# Patient Record
Sex: Male | Born: 1945
Health system: Southern US, Community
[De-identification: ages and names within clinical notes are randomized; demographics above are authoritative.]

## PROBLEM LIST (undated history)

## (undated) ENCOUNTER — Emergency Department (HOSPITAL_COMMUNITY): Discharge status: Absent without leave | Payer: Medicare PPO

## (undated) DIAGNOSIS — C801 Malignant (primary) neoplasm, unspecified: Secondary | ICD-10-CM

## (undated) DIAGNOSIS — I10 Essential (primary) hypertension: Secondary | ICD-10-CM

## (undated) DIAGNOSIS — E785 Hyperlipidemia, unspecified: Secondary | ICD-10-CM

## (undated) DIAGNOSIS — K219 Gastro-esophageal reflux disease without esophagitis: Secondary | ICD-10-CM

## (undated) HISTORY — PX: TONSILLECTOMY: SUR1361

## (undated) HISTORY — PX: OTHER SURGICAL HISTORY: SHX169

---

## 1999-06-08 ENCOUNTER — Ambulatory Visit (HOSPITAL_COMMUNITY): Admission: RE | Admit: 1999-06-08 | Discharge: 1999-06-08 | Payer: Self-pay | Admitting: Family Medicine

## 2007-04-16 ENCOUNTER — Emergency Department (HOSPITAL_COMMUNITY): Admission: EM | Admit: 2007-04-16 | Discharge: 2007-04-16 | Payer: Self-pay | Admitting: Emergency Medicine

## 2007-04-18 ENCOUNTER — Ambulatory Visit (HOSPITAL_COMMUNITY): Admission: RE | Admit: 2007-04-18 | Discharge: 2007-04-19 | Payer: Self-pay | Admitting: Orthopaedic Surgery

## 2007-12-01 ENCOUNTER — Emergency Department (HOSPITAL_COMMUNITY): Admission: EM | Admit: 2007-12-01 | Discharge: 2007-12-01 | Payer: Self-pay | Admitting: Emergency Medicine

## 2008-01-09 ENCOUNTER — Ambulatory Visit (HOSPITAL_COMMUNITY): Admission: RE | Admit: 2008-01-09 | Discharge: 2008-01-09 | Payer: Self-pay | Admitting: General Surgery

## 2008-02-04 ENCOUNTER — Ambulatory Visit (HOSPITAL_COMMUNITY): Admission: RE | Admit: 2008-02-04 | Discharge: 2008-02-04 | Payer: Self-pay | Admitting: General Surgery
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
## 2014-10-21 ENCOUNTER — Encounter (HOSPITAL_COMMUNITY): Payer: Self-pay | Admitting: Emergency Medicine

## 2014-10-21 ENCOUNTER — Emergency Department (HOSPITAL_COMMUNITY): Payer: Medicare PPO

## 2014-10-21 ENCOUNTER — Emergency Department (HOSPITAL_COMMUNITY)
Admission: EM | Admit: 2014-10-21 | Discharge: 2014-10-21 | Disposition: A | Discharge status: Home / Self Care | Payer: Medicare PPO | Attending: Emergency Medicine | Admitting: Emergency Medicine

## 2014-10-21 DIAGNOSIS — K219 Gastro-esophageal reflux disease without esophagitis: Secondary | ICD-10-CM | POA: Diagnosis not present

## 2014-10-21 DIAGNOSIS — Z79899 Other long term (current) drug therapy: Secondary | ICD-10-CM | POA: Insufficient documentation

## 2014-10-21 DIAGNOSIS — M7022 Olecranon bursitis, left elbow: Secondary | ICD-10-CM | POA: Diagnosis not present

## 2014-10-21 DIAGNOSIS — E785 Hyperlipidemia, unspecified: Secondary | ICD-10-CM | POA: Insufficient documentation

## 2014-10-21 DIAGNOSIS — Y9389 Activity, other specified: Secondary | ICD-10-CM | POA: Insufficient documentation

## 2014-10-21 DIAGNOSIS — W228XXA Striking against or struck by other objects, initial encounter: Secondary | ICD-10-CM | POA: Insufficient documentation

## 2014-10-21 DIAGNOSIS — I10 Essential (primary) hypertension: Secondary | ICD-10-CM | POA: Insufficient documentation

## 2014-10-21 DIAGNOSIS — Y92810 Car as the place of occurrence of the external cause: Secondary | ICD-10-CM | POA: Diagnosis not present

## 2014-10-21 DIAGNOSIS — Y998 Other external cause status: Secondary | ICD-10-CM | POA: Insufficient documentation

## 2014-10-21 DIAGNOSIS — S59902A Unspecified injury of left elbow, initial encounter: Secondary | ICD-10-CM | POA: Diagnosis not present

## 2014-10-21 HISTORY — DX: Gastro-esophageal reflux disease without esophagitis: K21.9

## 2014-10-21 HISTORY — DX: Essential (primary) hypertension: I10

## 2014-10-21 HISTORY — DX: Hyperlipidemia, unspecified: E78.5

## 2014-10-21 MED ORDER — NAPROXEN 500 MG PO TABS: 500.0000 mg | ORAL_TABLET | Freq: Two times a day (BID) | ORAL | Status: DC
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
## 2014-10-21 NOTE — Discharge Instructions (Signed)
Bursitis Bursitis is when the fluid-filled sac (bursa) that covers and protects a joint gets puffy and irritated. The elbow, shoulder, hip, and knee joints are most often affected. HOME CARE  Put ice on the area.  Put ice in a plastic bag.  Place a towel between your skin and the bag.  Leave the ice on for 15-20 minutes, 03-04 times a day.  Put the joint through a full range of motion 4 times a day. Rest the injured joint at other times. When you have less pain, begin slow movements and usual activities.  Only take medicine as told by your doctor.  Follow up with your doctor. Any delay in care could stop the bursitis from healing. This could cause long-term pain. GET HELP RIGHT AWAY IF:   You have more pain with treatment.  You have a temperature by mouth above 102 F (38.9 C), not controlled by medicine.  You have heat and irritation over the fluid-filled sac. MAKE SURE YOU:   Understand these instructions.  Will watch your condition.  Will get help right away if you are not doing well or get worse. Document Released: 05/04/2010 Document Revised: 02/06/2012 Document Reviewed: 02/03/2014 [facility] Patient Information 2015 [place], [place]. This information is not intended to replace advice given to you by your health care provider. Make sure you discuss any questions you have with your health care provider.   Your evaluated in the ED today for your elbow discomfort. There is no emergent cause identified. Your symptoms are most consistent with an elbow bursitis. You may take naproxen for any inflammation or discomfort you may experience. You may continue to use an Ace wrap to provide compression around the elbow. If your elbow becomes red, warm or swollen please return to ED for further evaluation. You may follow-up with primary care or [facility]

## 2015-05-20 ENCOUNTER — Encounter: Payer: Self-pay | Admitting: Family

## 2015-05-20 ENCOUNTER — Ambulatory Visit (INDEPENDENT_AMBULATORY_CARE_PROVIDER_SITE_OTHER): Payer: Medicare HMO | Admitting: Family

## 2015-05-20 ENCOUNTER — Ambulatory Visit (INDEPENDENT_AMBULATORY_CARE_PROVIDER_SITE_OTHER)
Admission: RE | Admit: 2015-05-20 | Discharge: 2015-05-20 | Disposition: A | Discharge status: Home / Self Care | Payer: Medicare HMO | Source: Ambulatory Visit | Attending: Family | Admitting: Family

## 2015-05-20 VITALS — BP 104/70 | HR 55 | Temp 98.1°F | Resp 18 | Ht 71.0 in | Wt 170.0 lb

## 2015-05-20 DIAGNOSIS — G8929 Other chronic pain: Secondary | ICD-10-CM | POA: Diagnosis not present

## 2015-05-20 DIAGNOSIS — I10 Essential (primary) hypertension: Secondary | ICD-10-CM | POA: Diagnosis not present

## 2015-05-20 DIAGNOSIS — M25552 Pain in left hip: Secondary | ICD-10-CM

## 2015-05-20 DIAGNOSIS — M436 Torticollis: Secondary | ICD-10-CM | POA: Diagnosis not present

## 2015-05-20 DIAGNOSIS — M25559 Pain in unspecified hip: Secondary | ICD-10-CM

## 2015-05-20 MED ORDER — AMLODIPINE BESYLATE 5 MG PO TABS: 5.0000 mg | ORAL_TABLET | Freq: Every day | ORAL | Status: DC

## 2015-05-20 MED ORDER — NAPROXEN 500 MG PO TABS: 500.0000 mg | ORAL_TABLET | Freq: Two times a day (BID) | ORAL | Status: DC | PRN

## 2015-05-20 MED ORDER — NAPROXEN 500 MG PO TABS
500.0000 mg | ORAL_TABLET | Freq: Two times a day (BID) | ORAL | Status: DC | PRN
Start: 2015-05-20 — End: 2015-09-01

## 2015-05-20 MED ORDER — LISINOPRIL-HYDROCHLOROTHIAZIDE 20-12.5 MG PO TABS: 1.0000 | ORAL_TABLET | Freq: Every day | ORAL | Status: DC

## 2015-05-20 MED ORDER — OMEPRAZOLE 20 MG PO CPDR: 20.0000 mg | DELAYED_RELEASE_CAPSULE | Freq: Every day | ORAL | Status: DC

## 2015-05-20 NOTE — Patient Instructions (Signed)
Thank you for choosing [facility].  Summary/Instructions:  Your prescription(s) have been submitted to your pharmacy or been printed and provided for you. Please take as directed and contact our office if you believe you are having problem(s) with the medication(s) or have any questions.  Please stop by radiology on the basement level of the building for your x-rays. Your results will be released to [place] (or called to you) after review, usually within 72 hours after test completion. If any treatments or changes are necessary, you will be notified at that same time.  If your symptoms worsen or fail to improve, please contact our office for further instruction, or in case of emergency go directly to the emergency room at the closest medical facility.    Cervical Strain and Sprain (Whiplash) with Rehab Cervical strain and sprain are injuries that commonly occur with "whiplash" injuries. Whiplash occurs when the neck is forcefully whipped backward or forward, such as during a motor vehicle accident or during contact sports. The muscles, ligaments, tendons, discs, and nerves of the neck are susceptible to injury when this occurs. RISK FACTORS Risk of having a whiplash injury increases if:  Osteoarthritis of the spine.  Situations that make head or neck accidents or trauma more likely.  High-risk sports (football, rugby, wrestling, hockey, auto racing, gymnastics, diving, contact karate, or boxing).  Poor strength and flexibility of the neck.  Previous neck injury.  Poor tackling technique.  Improperly fitted or padded equipment. SYMPTOMS   Pain or stiffness in the front or back of neck or both.  Symptoms may present immediately or up to 24 hours after injury.  Dizziness, headache, nausea, and vomiting.  Muscle spasm with soreness and stiffness in the neck.  Tenderness and swelling at the injury site. PREVENTION  Learn and use proper technique (avoid tackling with the  head, spearing, and head-butting; use proper falling techniques to avoid landing on the head).  Warm up and stretch properly before activity.  Maintain physical fitness:  Strength, flexibility, and endurance.  Cardiovascular fitness.  Wear properly fitted and padded protective equipment, such as padded soft collars, for participation in contact sports. PROGNOSIS  Recovery from cervical strain and sprain injuries is dependent on the extent of the injury. These injuries are usually curable in 1 week to 3 months with appropriate treatment.  RELATED COMPLICATIONS   Temporary numbness and weakness may occur if the nerve roots are damaged, and this may persist until the nerve has completely healed.  Chronic pain due to frequent recurrence of symptoms.  Prolonged healing, especially if activity is resumed too soon (before complete recovery). TREATMENT  Treatment initially involves the use of ice and medication to help reduce pain and inflammation. It is also important to perform strengthening and stretching exercises and modify activities that worsen symptoms so the injury does not get worse. These exercises may be performed at home or with a therapist. For patients who experience severe symptoms, a soft, padded collar may be recommended to be worn around the neck.  Improving your posture may help reduce symptoms. Posture improvement includes pulling your chin and abdomen in while sitting or standing. If you are sitting, sit in a firm chair with your buttocks against the back of the chair. While sleeping, try replacing your pillow with a small towel rolled to 2 inches in diameter, or use a cervical pillow or soft cervical collar. Poor sleeping positions delay healing.  For patients with nerve root damage, which causes numbness or weakness, the  use of a cervical traction apparatus may be recommended. Surgery is rarely necessary for these injuries. However, cervical strain and sprains that are present  at birth (congenital) may require surgery. MEDICATION   If pain medication is necessary, nonsteroidal anti-inflammatory medications, such as aspirin and ibuprofen, or other minor pain relievers, such as acetaminophen, are often recommended.  Do not take pain medication for 7 days before surgery.  Prescription pain relievers may be given if deemed necessary by your caregiver. Use only as directed and only as much as you need. HEAT AND COLD:   Cold treatment (icing) relieves pain and reduces inflammation. Cold treatment should be applied for 10 to 15 minutes every 2 to 3 hours for inflammation and pain and immediately after any activity that aggravates your symptoms. Use ice packs or an ice massage.  Heat treatment may be used prior to performing the stretching and strengthening activities prescribed by your caregiver, physical therapist, or athletic trainer. Use a heat pack or a warm soak. SEEK MEDICAL CARE IF:   Symptoms get worse or do not improve in 2 weeks despite treatment.  New, unexplained symptoms develop (drugs used in treatment may produce side effects). EXERCISES RANGE OF MOTION (ROM) AND STRETCHING EXERCISES - Cervical Strain and Sprain These exercises may help you when beginning to rehabilitate your injury. In order to successfully resolve your symptoms, you must improve your posture. These exercises are designed to help reduce the forward-head and rounded-shoulder posture which contributes to this condition. Your symptoms may resolve with or without further involvement from your physician, physical therapist or athletic trainer. While completing these exercises, remember:   Restoring tissue flexibility helps normal motion to return to the joints. This allows healthier, less painful movement and activity.  An effective stretch should be held for at least 20 seconds, although you may need to begin with shorter hold times for comfort.  A stretch should never be painful. You should  only feel a gentle lengthening or release in the stretched tissue. STRETCH- Axial Extensors  Lie on your back on the floor. You may bend your knees for comfort. Place a rolled-up hand towel or dish towel, about 2 inches in diameter, under the part of your head that makes contact with the floor.  Gently tuck your chin, as if trying to make a "double chin," until you feel a gentle stretch at the base of your head.  Hold __________ seconds. Repeat __________ times. Complete this exercise __________ times per day.  STRETCH - Axial Extension   Stand or sit on a firm surface. Assume a good posture: chest up, shoulders drawn back, abdominal muscles slightly tense, knees unlocked (if standing) and feet hip width apart.  Slowly retract your chin so your head slides back and your chin slightly lowers. Continue to look straight ahead.  You should feel a gentle stretch in the back of your head. Be certain not to feel an aggressive stretch since this can cause headaches later.  Hold for __________ seconds. Repeat __________ times. Complete this exercise __________ times per day. STRETCH - Cervical Side Bend   Stand or sit on a firm surface. Assume a good posture: chest up, shoulders drawn back, abdominal muscles slightly tense, knees unlocked (if standing) and feet hip width apart.  Without letting your nose or shoulders move, slowly tip your right / left ear to your shoulder until your feel a gentle stretch in the muscles on the opposite side of your neck.  Hold __________ seconds. Repeat __________  times. Complete this exercise __________ times per day. STRETCH - Cervical Rotators   Stand or sit on a firm surface. Assume a good posture: chest up, shoulders drawn back, abdominal muscles slightly tense, knees unlocked (if standing) and feet hip width apart.  Keeping your eyes level with the ground, slowly turn your head until you feel a gentle stretch along the back and opposite side of your  neck.  Hold __________ seconds. Repeat __________ times. Complete this exercise __________ times per day. RANGE OF MOTION - Neck Circles   Stand or sit on a firm surface. Assume a good posture: chest up, shoulders drawn back, abdominal muscles slightly tense, knees unlocked (if standing) and feet hip width apart.  Gently roll your head down and around from the back of one shoulder to the back of the other. The motion should never be forced or painful.  Repeat the motion 10-20 times, or until you feel the neck muscles relax and loosen. Repeat __________ times. Complete the exercise __________ times per day. STRENGTHENING EXERCISES - Cervical Strain and Sprain These exercises may help you when beginning to rehabilitate your injury. They may resolve your symptoms with or without further involvement from your physician, physical therapist, or athletic trainer. While completing these exercises, remember:   Muscles can gain both the endurance and the strength needed for everyday activities through controlled exercises.  Complete these exercises as instructed by your physician, physical therapist, or athletic trainer. Progress the resistance and repetitions only as guided.  You may experience muscle soreness or fatigue, but the pain or discomfort you are trying to eliminate should never worsen during these exercises. If this pain does worsen, stop and make certain you are following the directions exactly. If the pain is still present after adjustments, discontinue the exercise until you can discuss the trouble with your clinician. STRENGTH - Cervical Flexors, Isometric  Face a wall, standing about 6 inches away. Place a small pillow, a ball about 6-8 inches in diameter, or a folded towel between your forehead and the wall.  Slightly tuck your chin and gently push your forehead into the soft object. Push only with mild to moderate intensity, building up tension gradually. Keep your jaw and forehead  relaxed.  Hold 10 to 20 seconds. Keep your breathing relaxed.  Release the tension slowly. Relax your neck muscles completely before you start the next repetition. Repeat __________ times. Complete this exercise __________ times per day. STRENGTH- Cervical Lateral Flexors, Isometric   Stand about 6 inches away from a wall. Place a small pillow, a ball about 6-8 inches in diameter, or a folded towel between the side of your head and the wall.  Slightly tuck your chin and gently tilt your head into the soft object. Push only with mild to moderate intensity, building up tension gradually. Keep your jaw and forehead relaxed.  Hold 10 to 20 seconds. Keep your breathing relaxed.  Release the tension slowly. Relax your neck muscles completely before you start the next repetition. Repeat __________ times. Complete this exercise __________ times per day. STRENGTH - Cervical Extensors, Isometric   Stand about 6 inches away from a wall. Place a small pillow, a ball about 6-8 inches in diameter, or a folded towel between the back of your head and the wall.  Slightly tuck your chin and gently tilt your head back into the soft object. Push only with mild to moderate intensity, building up tension gradually. Keep your jaw and forehead relaxed.  Hold 10 to  20 seconds. Keep your breathing relaxed.  Release the tension slowly. Relax your neck muscles completely before you start the next repetition. Repeat __________ times. Complete this exercise __________ times per day. POSTURE AND BODY MECHANICS CONSIDERATIONS - Cervical Strain and Sprain Keeping correct posture when sitting, standing or completing your activities will reduce the stress put on different body tissues, allowing injured tissues a chance to heal and limiting painful experiences. The following are general guidelines for improved posture. Your physician or physical therapist will provide you with any instructions specific to your needs. While  reading these guidelines, remember:  The exercises prescribed by your provider will help you have the flexibility and strength to maintain correct postures.  The correct posture provides the optimal environment for your joints to work. All of your joints have less wear and tear when properly supported by a spine with good posture. This means you will experience a healthier, less painful body.  Correct posture must be practiced with all of your activities, especially prolonged sitting and standing. Correct posture is as important when doing repetitive low-stress activities (typing) as it is when doing a single heavy-load activity (lifting). PROLONGED STANDING WHILE SLIGHTLY LEANING FORWARD When completing a task that requires you to lean forward while standing in one place for a long time, place either foot up on a stationary 2- to 4-inch high object to help maintain the best posture. When both feet are on the ground, the low back tends to lose its slight inward curve. If this curve flattens (or becomes too large), then the back and your other joints will experience too much stress, fatigue more quickly, and can cause pain.  RESTING POSITIONS Consider which positions are most painful for you when choosing a resting position. If you have pain with flexion-based activities (sitting, bending, stooping, squatting), choose a position that allows you to rest in a less flexed posture. You would want to avoid curling into a fetal position on your side. If your pain worsens with extension-based activities (prolonged standing, working overhead), avoid resting in an extended position such as sleeping on your stomach. Most people will find more comfort when they rest with their spine in a more neutral position, neither too rounded nor too arched. Lying on a non-sagging bed on your side with a pillow between your knees, or on your back with a pillow under your knees will often provide some relief. Keep in mind, being in  any one position for a prolonged period of time, no matter how correct your posture, can still lead to stiffness. WALKING Walk with an upright posture. Your ears, shoulders, and hips should all line up. OFFICE WORK When working at a desk, create an environment that supports good, upright posture. Without extra support, muscles fatigue and lead to excessive strain on joints and other tissues. CHAIR:  A chair should be able to slide under your desk when your back makes contact with the back of the chair. This allows you to work closely.  The chair's height should allow your eyes to be level with the upper part of your monitor and your hands to be slightly lower than your elbows.  Body position:  Your feet should make contact with the floor. If this is not possible, use a foot rest.  Keep your ears over your shoulders. This will reduce stress on your neck and low back. Document Released: 11/14/2005 Document Revised: 03/31/2014 Document Reviewed: 02/26/2009 [facility] Patient Information 2015 [place], [place]. This information is not intended to  replace advice given to you by your health care provider. Make sure you discuss any questions you have with your health care provider.  

## 2015-05-20 NOTE — Assessment & Plan Note (Signed)
Most likely related to muscle tightness, however cannot rule out underlying cervical pathology. Obtain cervical x-rays to rule out any stenosis. Continue with conservative treatment at this time with heat, stretching, and 500 mg in proximal and as needed for discomfort. Continue wearing soft neck collar as needed for discomfort when it occurs. Follow-up if symptoms worsen or fail to improve.  NOTE; cervical x-rays normal with no evidence of fracture or stenosis.

## 2015-05-20 NOTE — Progress Notes (Signed)
Pre visit review using our clinic review tool, if applicable. No additional management support is needed unless otherwise documented below in the visit note. 

## 2015-05-20 NOTE — Assessment & Plan Note (Signed)
Blood pressure remains well controlled with current regimen and below goal of 140/90. Continue current dosages of amlodipine and lisinopril-hydrochlorothiazide. Continue to monitor blood pressures at home periodically. Follow up in 3 months.

## 2015-05-20 NOTE — Progress Notes (Signed)
Subjective:    Patient ID: Jeremy Mack, male    DOB: [birth year], 69 y.o.   MRN: [id number]  Chief Complaint  Patient presents with  . Establish Care    having trouble with neck stiffness and pain in his left side that goes down through his leg when he lays on that side,     HPI:  Jeremy Mack is a 69 y.o. male with a PMH of hypertension, hyperlipidemia, and GERD who presents today an office visit to establish care.   1.) Neck - Associated symptom pain/discomfort located in his neck has been waxing and waning for about 2 years. Pain is described as stinging and has modifying factors of a soft collar and OTC creams which help to relieve the pain. Severity of the pain ranges around 6/10 on average. It currently does not effect his functionality, however it has woken him up at night. Has not had any imaging or treatments other than described.  2.) Left leg pain - Associated symptom of pain located in his left leg has been going on for . Indicates the pain usually occurs when he lays on it. Describes the pain as sharp that goes down his leg from his hip. Movement is a modifying factor that helps with the pain.  3.) Hypertension - previously diagnosed with hypertension and currently maintained on amlodipine and lisinopril-hydrochlorothiazide. Patient indicates he takes his medications as prescribed and denies adverse side effects. Requesting refill of medications.  BP Readings from Last 3 Encounters:  05/20/15 104/70  10/21/14 116/97    No Known Allergies   Outpatient Prescriptions Prior to Visit  Medication Sig Dispense Refill  . pravastatin (PRAVACHOL) 80 MG tablet Take 80 mg by mouth daily.    Marland Kitchen amLODipine (NORVASC) 5 MG tablet Take 5 mg by mouth daily.    Marland Kitchen lisinopril-hydrochlorothiazide (PRINZIDE,ZESTORETIC) 20-12.5 MG per tablet Take 1 tablet by mouth 2 (two) times daily.    . naproxen (NAPROSYN) 500 MG tablet Take 1 tablet (500 mg total) by mouth 2 (two) times daily. 30 tablet 0  .  omeprazole (PRILOSEC) 20 MG capsule Take 20 mg by mouth daily.     No facility-administered medications prior to visit.     Past Medical History  Diagnosis Date  . Hypertension   . GERD (gastroesophageal reflux disease)   . Hyperlipemia      History reviewed. No pertinent past surgical history.   Family History  Problem Relation Age of Onset  . Diabetes Mother   . Diabetes Maternal Grandmother      History   Social History  . Marital Status: Single    Spouse Name: N/A  . Number of Children: 4  . Years of Education: 14   Occupational History  . Retired    Social History Main Topics  . Smoking status: Current Every Day Smoker    Types: Cigars  . Smokeless tobacco: Never Used  . Alcohol Use: 0.0 oz/week    0 Standard drinks or equivalent per week     Comment: Occasionally  . Drug Use: No  . Sexual Activity: Not on file   Other Topics Concern  . Not on file   Social History Narrative   Fun: Swimming, and outdoor sports   Denies religious beliefs effecting health care.     Review of Systems  Constitutional: Negative for fever and chills.  Eyes:       Negative for changes in vision  Respiratory: Negative for chest tightness and shortness of breath.  Cardiovascular: Negative for chest pain, palpitations and leg swelling.  Musculoskeletal: Positive for arthralgias (Left hip and lower extremity), neck pain and neck stiffness.  Neurological: Negative for numbness and headaches.      Objective:    BP 104/70 mmHg  Pulse 55  Temp(Src) 98.1 F (36.7 C) (Oral)  Resp 18  Ht '5\' 11"'$  (1.803 m)  Wt 170 lb (77.111 kg)  BMI 23.72 kg/m2  SpO2 97% Nursing note and vital signs reviewed.  Physical Exam  Constitutional: He is oriented to person, place, and time. He appears well-developed and well-nourished. No distress.  Neck:  No obvious deformity, discoloration, or edema noted. No palpable tenderness able to be elicited. Range of motion is equal bilaterally and  intact and appropriate with some decrease and lateral bending. Cervical compression is negative. Spurling's is negative.  Cardiovascular: Normal rate, regular rhythm, normal heart sounds and intact distal pulses.   Pulmonary/Chest: Effort normal and breath sounds normal.  Musculoskeletal:  No obvious deformity, discoloration, or edema of the left lateral leg noted. Palpable tenderness along greater trochanter of the left hip consistent with area of the greater trochanteric bursa. Range of motion is full and complete with no restrictions. Unable to reproduce patient's pain was exam. Distal pulses, sensation, and reflexes are intact and appropriate.  Neurological: He is alert and oriented to person, place, and time.  Skin: Skin is warm and dry.  Psychiatric: He has a normal mood and affect. His behavior is normal. Judgment and thought content normal.       Assessment & Plan:   Problem List Items Addressed This Visit      Cardiovascular and Mediastinum   Essential hypertension    Blood pressure remains well controlled with current regimen and below goal of 140/90. Continue current dosages of amlodipine and lisinopril-hydrochlorothiazide. Continue to monitor blood pressures at home periodically. Follow up in 3 months.      Relevant Medications   amLODipine (NORVASC) 5 MG tablet   lisinopril-hydrochlorothiazide (PRINZIDE,ZESTORETIC) 20-12.5 MG per tablet     Other   Neck stiffness - Primary    Most likely related to muscle tightness, however cannot rule out underlying cervical pathology. Obtain cervical x-rays to rule out any stenosis. Continue with conservative treatment at this time with heat, stretching, and 500 mg in proximal and as needed for discomfort. Continue wearing soft neck collar as needed for discomfort when it occurs. Follow-up if symptoms worsen or fail to improve.  NOTE; cervical x-rays normal with no evidence of fracture or stenosis.      Relevant Medications   naproxen  (NAPROSYN) 500 MG tablet   Other Relevant Orders   DG Cervical Spine Complete (Completed)   Hip pain, chronic    Most likely related to trochanteric bursitis, however cannot rule out iliotibial band syndrome. Continue conservative treatment at this time with heat, stretching, and naproxen as needed for discomfort. Follow up if symptoms worsen or fail to improve for possible cortisone injection.      Relevant Medications   naproxen (NAPROSYN) 500 MG tablet

## 2015-05-20 NOTE — Assessment & Plan Note (Signed)
Most likely related to trochanteric bursitis, however cannot rule out iliotibial band syndrome. Continue conservative treatment at this time with heat, stretching, and naproxen as needed for discomfort. Follow up if symptoms worsen or fail to improve for possible cortisone injection.

## 2015-05-21 ENCOUNTER — Telehealth: Payer: Self-pay | Admitting: Family

## 2015-05-21 NOTE — Telephone Encounter (Signed)
Please inform patient that his neck x-rays are normal with no evidence of fracture or narrowing. Therefore please continue with the current treatment plan and follow-up as needed.

## 2015-05-22 NOTE — Telephone Encounter (Signed)
LVM for pt to call back.

## 2015-07-24 ENCOUNTER — Telehealth: Payer: Self-pay | Admitting: Family

## 2015-07-24 NOTE — Telephone Encounter (Signed)
Do not see when last lipid profile was done. Please advise.

## 2015-07-24 NOTE — Telephone Encounter (Signed)
Patient requesting refill for pravastatin (PRAVACHOL) 80 MG tablet [[id number] Pharmacy [facility]

## 2015-07-27 NOTE — Telephone Encounter (Signed)
Please have him come in for a lipid profile before refill of medication. Needs to be fasting.

## 2015-07-28 NOTE — Telephone Encounter (Signed)
LVM letting pt know.  

## 2015-09-01 ENCOUNTER — Telehealth: Payer: Self-pay | Admitting: *Deleted

## 2015-09-01 DIAGNOSIS — G8929 Other chronic pain: Secondary | ICD-10-CM

## 2015-09-01 DIAGNOSIS — I10 Essential (primary) hypertension: Secondary | ICD-10-CM

## 2015-09-01 DIAGNOSIS — M436 Torticollis: Secondary | ICD-10-CM

## 2015-09-01 DIAGNOSIS — M25552 Pain in left hip: Secondary | ICD-10-CM

## 2015-09-01 MED ORDER — NAPROXEN 500 MG PO TABS: 500.0000 mg | ORAL_TABLET | Freq: Two times a day (BID) | ORAL | Status: DC | PRN

## 2015-09-01 MED ORDER — AMLODIPINE BESYLATE 5 MG PO TABS: 5.0000 mg | ORAL_TABLET | Freq: Every day | ORAL | Status: DC

## 2015-09-01 MED ORDER — OMEPRAZOLE 20 MG PO CPDR: 20.0000 mg | DELAYED_RELEASE_CAPSULE | Freq: Every day | ORAL | Status: DC

## 2015-09-01 NOTE — Telephone Encounter (Signed)
Left msg on triage requesting call back. Call pt back pt states he is needing refills on lisinopril, omeprazole, and naproxen. Verified pharmacy inform pt will send to [facility]...Jeremy Mack

## 2015-09-07 ENCOUNTER — Telehealth: Payer: Self-pay | Admitting: Family

## 2015-09-07 ENCOUNTER — Encounter: Payer: Self-pay | Admitting: Family

## 2015-09-07 ENCOUNTER — Ambulatory Visit (INDEPENDENT_AMBULATORY_CARE_PROVIDER_SITE_OTHER): Payer: Medicare HMO | Admitting: Family

## 2015-09-07 ENCOUNTER — Other Ambulatory Visit (INDEPENDENT_AMBULATORY_CARE_PROVIDER_SITE_OTHER): Payer: Medicare HMO

## 2015-09-07 VITALS — BP 138/88 | HR 79 | Temp 98.6°F | Resp 18 | Ht 71.0 in | Wt 182.3 lb

## 2015-09-07 DIAGNOSIS — I10 Essential (primary) hypertension: Secondary | ICD-10-CM | POA: Diagnosis not present

## 2015-09-07 DIAGNOSIS — E785 Hyperlipidemia, unspecified: Secondary | ICD-10-CM

## 2015-09-07 LAB — COMPREHENSIVE METABOLIC PANEL
ALK PHOS: 95 U/L (ref 39–117)
ALT: 13 U/L (ref 0–53)
AST: 14 U/L (ref 0–37)
Albumin: 4.2 g/dL (ref 3.5–5.2)
BUN: 16 mg/dL (ref 6–23)
CHLORIDE: 105 meq/L (ref 96–112)
CO2: 31 mEq/L (ref 19–32)
Calcium: 9.6 mg/dL (ref 8.4–10.5)
Creatinine, Ser: 1.04 mg/dL (ref 0.40–1.50)
GFR: 91.55 mL/min (ref >60.00)
GLUCOSE: 112 mg/dL — AB (ref 70–99)
POTASSIUM: 3.7 meq/L (ref 3.5–5.1)
SODIUM: 141 meq/L (ref 135–145)
TOTAL PROTEIN: 7.8 g/dL (ref 6.0–8.3)
Total Bilirubin: 0.6 mg/dL (ref 0.2–1.2)

## 2015-09-07 LAB — LIPID PANEL
Cholesterol: 243 mg/dL — ABNORMAL HIGH (ref 0–200)
HDL: 39.4 mg/dL (ref >39.00)
NONHDL: 203.92
TRIGLYCERIDES: 326 mg/dL — AB (ref 0.0–149.0)
Total CHOL/HDL Ratio: 6
VLDL: 65.2 mg/dL — AB (ref 0.0–40.0)

## 2015-09-07 LAB — LDL CHOLESTEROL, DIRECT: LDL DIRECT: 141 mg/dL

## 2015-09-07 NOTE — Patient Instructions (Signed)
Thank you for choosing [facility].  Summary/Instructions:  Please continue to take your medications as prescribed.   Please stop by the lab on the basement level of the building for your blood work. Your results will be released to [facility] (or called to you) after review, usually within 72 hours after test completion. If any changes need to be made, you will be notified at that same time.  If your symptoms worsen or fail to improve, please contact our office for further instruction, or in case of emergency go directly to the emergency room at the closest medical facility.

## 2015-09-07 NOTE — Telephone Encounter (Signed)
Please inform patient that his kidney function, liver function and electrolytes are within the normal ranges. His cholesterol however, remains elevated with his total cholesterol being 243 with a goal of less than 200 and his triglycerides being 326 with a goal of less than 150. Therefore I would recommend that he continue the current dose of pravastatin and I would recommend starting a medication called Vascepa to help reduce his triglyceride levels. I will send it to his pharmacy he is agreeable.

## 2015-09-07 NOTE — Assessment & Plan Note (Signed)
Hypertension controlled with current regimen of below goal of 140/90. Denies adverse side effects. Continue current dosage of lisinopril-hydrochlorothiazide and amlodipine. Monitor blood pressure at home. Follow-up in 6 months.

## 2015-09-07 NOTE — Assessment & Plan Note (Signed)
Currently maintained on pravastatin with no adverse effects. Obtain lipid profile to determine current status. Continue current dosage of pravastatin pending lipid profile results.

## 2015-09-07 NOTE — Progress Notes (Signed)
Pre visit review using our clinic review tool, if applicable. No additional management support is needed unless otherwise documented below in the visit note. 

## 2015-09-07 NOTE — Progress Notes (Signed)
   Subjective:    Patient ID: Jeremy Mack, male    DOB: 12-Apr-1946, 69 y.o.   MRN: [id number]  Chief Complaint  Patient presents with  . Medication Management    Fasting needs lipid panel for cholesterol medication    HPI:  Jeremy Mack is a 69 y.o. male who  has a past medical history of Hypertension; GERD (gastroesophageal reflux disease); and Hyperlipemia. and presents today for a follow up office visit.   1.) Hypertension - Currently maintained on amlodipine and lisinorpil-HCTZ. Takes the medication as prescribed and denies adverse side effects or hypotensive events. Does not currently take his blood pressure at home.  BP Readings from Last 3 Encounters:  09/07/15 138/88  05/20/15 104/70  10/21/14 116/97    2.) Hypercholesterolemia - Currently maintained on pravastatin. Takes the medication as prescribed and denies adverse side effects or myalgia. Due for a lipid profile.   No Known Allergies   Current Outpatient Prescriptions on File Prior to Visit  Medication Sig Dispense Refill  . amLODipine (NORVASC) 5 MG tablet Take 1 tablet (5 mg total) by mouth daily. 90 tablet 2  . lisinopril-hydrochlorothiazide (PRINZIDE,ZESTORETIC) 20-12.5 MG per tablet Take 1 tablet by mouth daily. 90 tablet 1  . naproxen (NAPROSYN) 500 MG tablet Take 1 tablet (500 mg total) by mouth 2 (two) times daily as needed. 180 tablet 2  . omeprazole (PRILOSEC) 20 MG capsule Take 1 capsule (20 mg total) by mouth daily. 90 capsule 2  . pravastatin (PRAVACHOL) 80 MG tablet Take 80 mg by mouth daily.     No current facility-administered medications on file prior to visit.    Review of Systems  Respiratory: Negative for cough, chest tightness and shortness of breath.   Cardiovascular: Negative for chest pain, palpitations and leg swelling.  Neurological: Negative for headaches.      Objective:    BP 138/88 mmHg  Pulse 79  Temp(Src) 98.6 F (37 C) (Oral)  Resp 18  Ht '5\' 11"'$  (1.803 m)  Wt 182 lb 4.8 oz  (82.691 kg)  BMI 25.44 kg/m2  SpO2 98% Nursing note and vital signs reviewed.  Physical Exam  Constitutional: He is oriented to person, place, and time. He appears well-developed and well-nourished. No distress.  Cardiovascular: Normal rate, regular rhythm, normal heart sounds and intact distal pulses.   Pulmonary/Chest: Effort normal and breath sounds normal.  Neurological: He is alert and oriented to person, place, and time.  Skin: Skin is warm and dry.  Psychiatric: He has a normal mood and affect. His behavior is normal. Judgment and thought content normal.       Assessment & Plan:   Problem List Items Addressed This Visit      Cardiovascular and Mediastinum   Essential hypertension    Hypertension controlled with current regimen of below goal of 140/90. Denies adverse side effects. Continue current dosage of lisinopril-hydrochlorothiazide and amlodipine. Monitor blood pressure at home. Follow-up in 6 months.      Relevant Orders   Comprehensive metabolic panel (Completed)     Other   Hyperlipidemia - Primary    Currently maintained on pravastatin with no adverse effects. Obtain lipid profile to determine current status. Continue current dosage of pravastatin pending lipid profile results.      Relevant Orders   Lipid Profile (Completed)

## 2015-09-09 MED ORDER — ICOSAPENT ETHYL 1 G PO CAPS: 2.0000 g | ORAL_CAPSULE | Freq: Two times a day (BID) | ORAL | Status: DC

## 2015-09-09 NOTE — Telephone Encounter (Signed)
Pt aware of results and would like to try the vascepa he wants that sent in to [facility] if possible

## 2015-09-09 NOTE — Addendum Note (Signed)
Addended by: Mauricio Po D on: 09/09/2015 05:27 PM   Modules accepted: Orders

## 2015-09-09 NOTE — Telephone Encounter (Signed)
Medication sent to [facility].

## 2015-10-07 ENCOUNTER — Telehealth: Payer: Self-pay | Admitting: Family
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
## 2015-10-08 ENCOUNTER — Other Ambulatory Visit: Payer: Self-pay | Admitting: Family

## 2015-10-08 DIAGNOSIS — I10 Essential (primary) hypertension: Secondary | ICD-10-CM

## 2015-10-08 DIAGNOSIS — M25552 Pain in left hip: Secondary | ICD-10-CM

## 2015-10-08 DIAGNOSIS — M436 Torticollis: Secondary | ICD-10-CM

## 2015-10-08 DIAGNOSIS — G8929 Other chronic pain: Secondary | ICD-10-CM

## 2015-10-08 MED ORDER — PRAVASTATIN SODIUM 80 MG PO TABS: 80.0000 mg | ORAL_TABLET | Freq: Every day | ORAL | Status: DC

## 2015-10-08 MED ORDER — LISINOPRIL-HYDROCHLOROTHIAZIDE 20-12.5 MG PO TABS: 1.0000 | ORAL_TABLET | Freq: Every day | ORAL | Status: DC

## 2015-10-08 MED ORDER — NAPROXEN 500 MG PO TABS: 500.0000 mg | ORAL_TABLET | Freq: Two times a day (BID) | ORAL | Status: DC | PRN

## 2015-10-08 MED ORDER — AMLODIPINE BESYLATE 5 MG PO TABS: 5.0000 mg | ORAL_TABLET | Freq: Every day | ORAL | Status: DC

## 2015-10-08 MED ORDER — OMEPRAZOLE 20 MG PO CPDR: 20.0000 mg | DELAYED_RELEASE_CAPSULE | Freq: Every day | ORAL | Status: DC

## 2015-10-08 MED ORDER — ICOSAPENT ETHYL 1 G PO CAPS: 2.0000 g | ORAL_CAPSULE | Freq: Two times a day (BID) | ORAL | Status: DC

## 2015-10-08 NOTE — Telephone Encounter (Signed)
Can you please send these to [facility] on [address].

## 2015-10-08 NOTE — Telephone Encounter (Signed)
Patient is calling back regarding encounter below

## 2015-10-08 NOTE — Telephone Encounter (Signed)
All medications sent to pharmacy

## 2015-10-08 NOTE — Telephone Encounter (Signed)
Rx have been sent to correct pharmacy and pt has picked them up. Called pharmacy to confirm.

## 2015-10-08 NOTE — Progress Notes (Signed)
Unable to leave msg.

## 2016-02-05 ENCOUNTER — Telehealth: Payer: Self-pay | Admitting: Family

## 2016-02-05 DIAGNOSIS — I10 Essential (primary) hypertension: Secondary | ICD-10-CM

## 2016-02-05 DIAGNOSIS — M25552 Pain in left hip: Secondary | ICD-10-CM

## 2016-02-05 DIAGNOSIS — G8929 Other chronic pain: Secondary | ICD-10-CM

## 2016-02-05 DIAGNOSIS — M436 Torticollis: Secondary | ICD-10-CM

## 2016-02-05 NOTE — Telephone Encounter (Signed)
Patient is requesting all 6 [facility]. i have changed the preferred pharmacy from the previous listed.

## 2016-02-07 MED ORDER — AMLODIPINE BESYLATE 5 MG PO TABS: 5.0000 mg | ORAL_TABLET | Freq: Every day | ORAL | Status: DC

## 2016-02-07 MED ORDER — NAPROXEN 500 MG PO TABS: 500.0000 mg | ORAL_TABLET | Freq: Two times a day (BID) | ORAL | Status: DC | PRN

## 2016-02-07 MED ORDER — LISINOPRIL-HYDROCHLOROTHIAZIDE 20-12.5 MG PO TABS: 1.0000 | ORAL_TABLET | Freq: Every day | ORAL | Status: DC

## 2016-02-07 MED ORDER — PRAVASTATIN SODIUM 80 MG PO TABS: 80.0000 mg | ORAL_TABLET | Freq: Every day | ORAL | Status: DC

## 2016-02-07 MED ORDER — ICOSAPENT ETHYL 1 G PO CAPS: 2.0000 g | ORAL_CAPSULE | Freq: Two times a day (BID) | ORAL | Status: DC

## 2016-02-07 MED ORDER — OMEPRAZOLE 20 MG PO CPDR: 20.0000 mg | DELAYED_RELEASE_CAPSULE | Freq: Every day | ORAL | Status: DC

## 2016-02-07 NOTE — Telephone Encounter (Signed)
3 month supply of medication sent.

## 2016-04-26 ENCOUNTER — Other Ambulatory Visit: Payer: Self-pay | Admitting: Family

## 2016-05-05 ENCOUNTER — Other Ambulatory Visit: Payer: Self-pay | Admitting: Family

## 2016-07-11 ENCOUNTER — Other Ambulatory Visit: Payer: Self-pay | Admitting: Family

## 2016-08-05 ENCOUNTER — Emergency Department (HOSPITAL_COMMUNITY): Payer: Medicare HMO

## 2016-08-05 ENCOUNTER — Encounter (HOSPITAL_COMMUNITY): Payer: Self-pay

## 2016-08-05 ENCOUNTER — Emergency Department (HOSPITAL_COMMUNITY)
Admission: EM | Admit: 2016-08-05 | Discharge: 2016-08-05 | Disposition: A | Discharge status: Home / Self Care | Payer: Medicare HMO | Attending: Emergency Medicine | Admitting: Emergency Medicine

## 2016-08-05 DIAGNOSIS — I10 Essential (primary) hypertension: Secondary | ICD-10-CM | POA: Diagnosis not present

## 2016-08-05 DIAGNOSIS — Y939 Activity, unspecified: Secondary | ICD-10-CM | POA: Diagnosis not present

## 2016-08-05 DIAGNOSIS — F17210 Nicotine dependence, cigarettes, uncomplicated: Secondary | ICD-10-CM | POA: Diagnosis not present

## 2016-08-05 DIAGNOSIS — W010XXA Fall on same level from slipping, tripping and stumbling without subsequent striking against object, initial encounter: Secondary | ICD-10-CM | POA: Diagnosis not present

## 2016-08-05 DIAGNOSIS — Z79899 Other long term (current) drug therapy: Secondary | ICD-10-CM | POA: Diagnosis not present

## 2016-08-05 DIAGNOSIS — Z791 Long term (current) use of non-steroidal anti-inflammatories (NSAID): Secondary | ICD-10-CM | POA: Diagnosis not present

## 2016-08-05 DIAGNOSIS — Y999 Unspecified external cause status: Secondary | ICD-10-CM | POA: Insufficient documentation

## 2016-08-05 DIAGNOSIS — Y92007 Garden or yard of unspecified non-institutional (private) residence as the place of occurrence of the external cause: Secondary | ICD-10-CM | POA: Diagnosis not present

## 2016-08-05 DIAGNOSIS — S42402A Unspecified fracture of lower end of left humerus, initial encounter for closed fracture: Secondary | ICD-10-CM | POA: Insufficient documentation

## 2016-08-05 DIAGNOSIS — S4992XA Unspecified injury of left shoulder and upper arm, initial encounter: Secondary | ICD-10-CM | POA: Diagnosis present

## 2016-08-05 MED ORDER — HYDROCODONE-ACETAMINOPHEN 5-325 MG PO TABS: 1.0000 | ORAL_TABLET | Freq: Three times a day (TID) | ORAL | 0 refills | Status: DC | PRN

## 2016-08-05 MED ORDER — ACETAMINOPHEN 325 MG PO TABS
650.0000 mg | ORAL_TABLET | Freq: Once | ORAL | Status: AC
  Administered 2016-08-05: 650 mg via ORAL
  Filled 2016-08-05 (×2): qty 2

## 2016-08-05 NOTE — Discharge Instructions (Addendum)
Read the information below.  You have a fracture of your arm. You were placed in a splint. You can take motrin '400mg'$  every 6hrs for pain. I have prescribed vicodin for severe pain.  It is important that you follow up with orthopedics. I have provided the contact information above. Please call.  Use the prescribed medication as directed.  Please discuss all new medications with your pharmacist.   You may return to the Emergency Department at any time for worsening condition or any new symptoms that concern you. Return to ED if you develop fever, redness/swelling/warmth, unable to move elbow, uncontrolled pain, numbness or weakness.

## 2016-08-05 NOTE — ED Provider Notes (Signed)
Complains of left elbow pain from injury 2 weeks ago. He does not recall exactly how he injured his elbow. On exam patient is alert and in no distress left upper extremity no soft tissue swelling no ecchymosis. He is mildly tender immediately proximal to the olecranon. Radial pulse 2+. He is unable to fully extend elbow however can extend to approximately 175 degrees   Orlie Dakin, MD 08/06/16 [phone]

## 2016-08-05 NOTE — ED Triage Notes (Signed)
PT C/O LEFT ELBOW PAIN RADIATING TO THE LEFT SHOULDER AFTER HE TRIPPED AND FELL IN THE GRASS SIDE 2 WEEKS AGO. PT DENIES HEAD INJURY OR LOC. PT STS "I THINK MY ARM IS BROKEN. I NEED SOME HELP."

## 2016-08-05 NOTE — ED Provider Notes (Signed)
[facility] DEPT Provider Note   CSN: [id number] Arrival date & time: 08/05/16  0347   By signing my name below, I, Estanislado Pandy, attest that this documentation has been prepared under the direction and in the presence of Gay Filler, PA-C. Electronically Signed: Estanislado Pandy, Scribe. 08/05/2016. 5:39 PM.   History   Chief Complaint Chief Complaint  Patient presents with  . Elbow Pain    LEFT    The history is provided by the patient. No language interpreter was used.   HPI Comments:  Jeremy Mack is a 70 y.o. male with PMHx of HTN who presents to the Emergency Department complaining of L elbow pain radiating to the L shoulder x 2-3 weeks.Pt reports that he fell 2 or 3 weeks ago in his yard hitting his L shoulder and L arm on the ground. Pt notes that elbow pain is exacerbated when moving. Pt states that he has taken Advil and Tylenol and was wearing a sleeve with no relief. Pt denies fever, numbness, weakness, warmth, swelling, or redness. No head injury. No LOC.    Past Medical History:  Diagnosis Date  . GERD (gastroesophageal reflux disease)   . Hyperlipemia   . Hypertension     Patient Active Problem List   Diagnosis Date Noted  . Hyperlipidemia 09/07/2015  . Neck stiffness 05/20/2015  . Hip pain, chronic 05/20/2015  . Essential hypertension 05/20/2015    History reviewed. No pertinent surgical history.     Home Medications    Prior to Admission medications   Medication Sig Start Date End Date Taking? Authorizing Provider  amLODipine (NORVASC) 5 MG tablet Take 1 tablet (5 mg total) by mouth daily. 02/07/16   Golden Circle, FNP  HYDROcodone-acetaminophen (NORCO/VICODIN) 5-325 MG tablet Take 1 tablet by mouth every 8 (eight) hours as needed for severe pain. 08/05/16   Roxanna Mew, PA-C  lisinopril-hydrochlorothiazide (PRINZIDE,ZESTORETIC) 20-12.5 MG tablet TAKE 1 TABLET BY MOUTH DAILY. YEARLY PHYSICAL DUE IN OCTOBER 07/11/16   Golden Circle, FNP    naproxen (NAPROSYN) 500 MG tablet Take 1 tablet (500 mg total) by mouth 2 (two) times daily as needed. 02/07/16   Golden Circle, FNP  omeprazole (PRILOSEC) 20 MG capsule Take 1 capsule (20 mg total) by mouth daily. 02/07/16   Golden Circle, FNP  pravastatin (PRAVACHOL) 80 MG tablet Take 1 tablet (80 mg total) by mouth daily. 02/07/16   Golden Circle, FNP  VASCEPA 1 g CAPS TAKE 2 CAPSULES TWICE DAILY 04/26/16   Golden Circle, FNP    Family History Family History  Problem Relation Age of Onset  . Diabetes Mother   . Diabetes Maternal Grandmother     Social History Social History  Substance Use Topics  . Smoking status: Current Every Day Smoker    Packs/day: 0.50    Types: Cigars  . Smokeless tobacco: Never Used  . Alcohol use 0.0 oz/week     Comment: Occasionally     Allergies   Review of patient's allergies indicates no known allergies.   Review of Systems Review of Systems  Constitutional: Negative for fever.  Respiratory: Negative for shortness of breath.   Cardiovascular: Negative for chest pain.  Musculoskeletal: Positive for arthralgias. Negative for joint swelling.  Skin: Negative for color change.  Neurological: Negative for syncope, weakness and numbness.     Physical Exam Updated Vital Signs Vitals:   08/05/16 1621 08/05/16 1912  BP: 120/83 (!) 138/120  Pulse: 85 88  Resp: 20  16  Temp: 98.1 F (36.7 C)   TempSrc: Oral   SpO2: 100% 98%  Weight: 89.8 kg   Height: '5\' 10"'$  (1.778 m)      Physical Exam  Constitutional: He appears well-developed and well-nourished. No distress.  HENT:  Head: Normocephalic and atraumatic.  Eyes: Conjunctivae are normal. No scleral icterus.  Neck: Normal range of motion.  Cardiovascular: Normal rate.   Pulmonary/Chest: Effort normal. No respiratory distress.  Abdominal: He exhibits no distension.  Musculoskeletal:  Left shoulder: no obvious deformity. No TTP. ROM intact. Left elbow: no obvious deformity.  Mild TTP. Full flexion. Extension to 175 degrees.  Left wrist: no obvious deformity. No TTP. ROM intact. Strength and sensation intact. 2+ radial pulses. Capillary refill <3seconds.   Neurological: He is alert.  Skin: Skin is warm and dry. He is not diaphoretic.  Psychiatric: He has a normal mood and affect. His behavior is normal.  Nursing note and vitals reviewed.    ED Treatments / Results  DIAGNOSTIC STUDIES:  Oxygen Saturation is 100% on RA, normal by my interpretation.    COORDINATION OF CARE:  5:39 PM Discussed treatment plan with pt at bedside and pt agreed to plan.  Labs (all labs ordered are listed, but only abnormal results are displayed) Labs Reviewed - No data to display  EKG  EKG Interpretation None       Radiology Dg Elbow Complete Left  Result Date: 08/05/2016 CLINICAL DATA:  70 year old male with fall 2 months ago. Continued pain radiating from the forearm to the shoulder. Initial encounter. EXAM: LEFT ELBOW - COMPLETE 3+ VIEW COMPARISON:  Left forearm series from today reported separately. Left elbow series [id number]. FINDINGS: Comminuted fairly transverse fracture through the distal left humerus at the epicondyles level (arrows). Mild periosteal reaction along the medial aspect of the fracture. The fracture is minimally displaced. Positive joint effusion. The more proximal visible distal left humerus appears intact. Joint spaces and alignment are preserved at the left elbow. Dystrophic/degenerative calcification along the posterior proximal ulna is unchanged. Overlying soft tissue swelling present in 2015 has resolved. Proximal left radius and ulna appear stable and intact. Tiny ossific fragment proximal to the left radial head was present in 2015. IMPRESSION: 1. Subacute transverse comminuted but largely nondisplaced fracture of the distal left humerus. 2. Suspected joint effusion. 3. No other acute or subacute fracture or dislocation identified about the left elbow.  Electronically Signed   By: Genevie Ann M.D.   On: 08/05/2016 17:16   Dg Forearm Left  Result Date: 08/05/2016 CLINICAL DATA:  70 year old male with fall 2 months ago. Continued pain radiating from the forearm to the shoulder. Initial encounter. EXAM: LEFT FOREARM - 2 VIEW COMPARISON:  Left wrist series 3 07/2008. FINDINGS: Healed fracture of the distal left ulna meta diaphysis. Distal left radius and ulna today appear intact. Visible alignment at the left wrist is stable. Bone mineralization is within normal limits. Mid and proximal radius and ulna appear intact. Left elbow reported separately today. IMPRESSION: 1. See left elbow series reported separately today. 2.  No acute fracture or dislocation identified in the left forearm. Electronically Signed   By: Genevie Ann M.D.   On: 08/05/2016 17:13   Dg Shoulder Left  Result Date: 08/05/2016 CLINICAL DATA:  70 year old male with fall 2 months ago. Continued pain radiating from the forearm to the shoulder. Initial encounter. EXAM: LEFT SHOULDER - 2+ VIEW COMPARISON:  Left elbow series today reported separately. Chest radiographs 04/18/2007. FINDINGS: No glenohumeral  joint dislocation. Bone mineralization is within normal limits for age. Proximal left humerus intact. Left clavicle and scapula appear intact. Visible left ribs and lung parenchyma within normal limits. IMPRESSION: No acute osseous abnormality identified about the left shoulder. Electronically Signed   By: Genevie Ann M.D.   On: 08/05/2016 17:17    Procedures Procedures (including critical care time)  Medications Ordered in ED Medications  acetaminophen (TYLENOL) tablet 650 mg (650 mg Oral Given 08/05/16 1910)     Initial Impression / Assessment and Plan / ED Course  I have reviewed the triage vital signs and the nursing notes.  Pertinent labs & imaging results that were available during my care of the patient were reviewed by me and considered in my medical decision making (see chart for  details).  Clinical Course  Value Comment By Time  DG Shoulder Left Reviewed Roxanna Mew, PA-C 09/08 2045  DG Elbow Complete Left Distal fracture of humerus noted.  Roxanna Mew, [place] 09/08 2045  DG Forearm Left Reviewed.  Roxanna Mew, [place] 09/08 2045    Patient presents to ED complaint of left elbow pain. Patient is afebrile and non-toxic appearing in NAD. VSS. Physical exam remarkable for mild TTP of left elbow. Patient able to fully flex. He is unable to fully extend; however, can extend approximately 175. Sensation is intact. 2+ radial pulses. Capillary refill <3 seconds.   X-ray remarkable for comminuted left distal humerus fracture; other x-ray unremarkable. Patient placed in posterior long arm splint. Pain medication given. Consult to hand surgery.   7:10 PM: Spoke with Dr. Amedeo Plenty, greatly appreciated this time and input. Keep in splint. Recommend OP follow up in 7-10 days.   Discussed results and plan with patient. Symptomatic management discussed. Rx norco. Review of [place] controlled substance database shows not recent narcotic rx. Ortho f/u information provided, f/u in 7-10 days. Return precautions given. Patient voiced understanding and is agreeable.    Final Clinical Impressions(s) / ED Diagnoses   Final diagnoses:  Closed fracture of left distal humerus, initial encounter    New Prescriptions Discharge Medication List as of 08/05/2016  7:04 PM    START taking these medications   Details  HYDROcodone-acetaminophen (NORCO/VICODIN) 5-325 MG tablet Take 1 tablet by mouth every 8 (eight) hours as needed for severe pain., Starting Fri 08/05/2016, Print      I personally performed the services described in this documentation, which was scribed in my presence. The recorded information has been reviewed and is accurate.     Roxanna Mew, [place] 08/05/16 2046    Orlie Dakin, MD 08/06/16 [phone]

## 2016-08-15 ENCOUNTER — Other Ambulatory Visit: Payer: Self-pay | Admitting: Family

## 2016-08-16 ENCOUNTER — Other Ambulatory Visit: Payer: Self-pay | Admitting: Family

## 2016-08-16 DIAGNOSIS — M436 Torticollis: Secondary | ICD-10-CM

## 2016-08-16 DIAGNOSIS — G8929 Other chronic pain: Secondary | ICD-10-CM

## 2016-08-16 DIAGNOSIS — M25552 Pain in left hip: Secondary | ICD-10-CM

## 2016-09-09 ENCOUNTER — Ambulatory Visit (INDEPENDENT_AMBULATORY_CARE_PROVIDER_SITE_OTHER): Payer: Medicare HMO | Admitting: Family

## 2016-09-09 ENCOUNTER — Encounter: Payer: Self-pay | Admitting: Family

## 2016-09-09 ENCOUNTER — Other Ambulatory Visit (INDEPENDENT_AMBULATORY_CARE_PROVIDER_SITE_OTHER): Payer: Medicare HMO

## 2016-09-09 VITALS — BP 152/94 | HR 73 | Temp 98.0°F | Resp 16 | Ht 70.0 in | Wt 179.0 lb

## 2016-09-09 DIAGNOSIS — Z0000 Encounter for general adult medical examination without abnormal findings: Secondary | ICD-10-CM | POA: Diagnosis not present

## 2016-09-09 DIAGNOSIS — Z23 Encounter for immunization: Secondary | ICD-10-CM

## 2016-09-09 DIAGNOSIS — E782 Mixed hyperlipidemia: Secondary | ICD-10-CM

## 2016-09-09 DIAGNOSIS — I10 Essential (primary) hypertension: Secondary | ICD-10-CM | POA: Diagnosis not present

## 2016-09-09 DIAGNOSIS — Z125 Encounter for screening for malignant neoplasm of prostate: Secondary | ICD-10-CM | POA: Diagnosis not present

## 2016-09-09 LAB — LIPID PANEL
CHOLESTEROL: 164 mg/dL (ref 0–200)
HDL: 47.1 mg/dL (ref >39.00)
LDL Cholesterol: 89 mg/dL (ref 0–99)
NonHDL: 116.58
TRIGLYCERIDES: 136 mg/dL (ref 0.0–149.0)
Total CHOL/HDL Ratio: 3
VLDL: 27.2 mg/dL (ref 0.0–40.0)

## 2016-09-09 LAB — COMPREHENSIVE METABOLIC PANEL
ALK PHOS: 120 U/L — AB (ref 39–117)
ALT: 18 U/L (ref 0–53)
AST: 17 U/L (ref 0–37)
Albumin: 4.4 g/dL (ref 3.5–5.2)
BUN: 12 mg/dL (ref 6–23)
CALCIUM: 9.4 mg/dL (ref 8.4–10.5)
CHLORIDE: 107 meq/L (ref 96–112)
CO2: 28 mEq/L (ref 19–32)
CREATININE: 1.04 mg/dL (ref 0.40–1.50)
GFR: 91.27 mL/min (ref >60.00)
Glucose, Bld: 113 mg/dL — ABNORMAL HIGH (ref 70–99)
POTASSIUM: 3.8 meq/L (ref 3.5–5.1)
SODIUM: 145 meq/L (ref 135–145)
TOTAL PROTEIN: 7.3 g/dL (ref 6.0–8.3)
Total Bilirubin: 0.7 mg/dL (ref 0.2–1.2)

## 2016-09-09 LAB — CBC
HEMATOCRIT: 41.5 % (ref 39.0–52.0)
Hemoglobin: 14.4 g/dL (ref 13.0–17.0)
MCHC: 34.7 g/dL (ref 30.0–36.0)
MCV: 95.4 fl (ref 78.0–100.0)
Platelets: 199 10*3/uL (ref 150.0–400.0)
RBC: 4.35 Mil/uL (ref 4.22–5.81)
RDW: 15.3 % (ref 11.5–15.5)
WBC: 7 10*3/uL (ref 4.0–10.5)

## 2016-09-09 LAB — PSA: PSA: 6.8 ng/mL — AB (ref 0.10–4.00)

## 2016-09-09 MED ORDER — AMLODIPINE BESYLATE 10 MG PO TABS: 10.0000 mg | ORAL_TABLET | Freq: Every day | ORAL | 0 refills | Status: DC

## 2016-09-09 NOTE — Progress Notes (Signed)
Subjective:    Patient ID: Jeremy Mack, male    DOB: 1946-03-11, 70 y.o.   MRN: [id number]  Chief Complaint  Patient presents with  . CPE    fasting    HPI:  Jeremy Mack is a 70 y.o. male who presents today for a Medicare Annual Wellness/Physical exam.    1) Health Maintenance -   Diet - Averages about 3 meals per day consisting of a regular diet. Caffeine intake of about 1-2 cups daily  Exercise - Regular exercise doing construction.   2) Preventative Exams / Immunizations:  Dental -- Up to date  Vision -- Up to date   Health Maintenance  Topic Date Due  . Hepatitis C Screening  08/[birth year]/48  . TETANUS/TDAP  06/13/1967  . COLONOSCOPY  06/12/1998  . ZOSTAVAX  06/12/2008  . PNA vac Low Risk Adult (1 of 2 - PCV13) 06/12/2013  . INFLUENZA VACCINE  06/28/2016    Immunization History  Administered Date(s) Administered  . Influenza, High Dose Seasonal PF 09/09/2016  . Tdap 09/09/2016    RISK FACTORS  Tobacco History  Smoking Status  . Current Every Day Smoker  . Packs/day: 0.50  . Types: Cigars  Smokeless Tobacco  . Never Used     Cardiac risk factors: advanced age (older than 55 for men, 22 for women), dyslipidemia, hypertension and male gender.  Depression Screen  Depression screen PHQ 2/9 09/09/2016  Decreased Interest 0  Down, Depressed, Hopeless 0  PHQ - 2 Score 0     Activities of Daily Living In your present state of health, do you have any difficulty performing the following activities?:  Driving? No Managing money?  No Feeding yourself? No Getting from bed to chair? No Climbing a flight of stairs? No Preparing food and eating?: No Bathing or showering? No Getting dressed: No Getting to the toilet? No Using the toilet: No Moving around from place to place: No In the past year have you fallen or had a near fall?:No   Home Safety Has smoke detector and wears seat belts. No firearms. No excess sun exposure. Are there smokers in your  home (other than you)?  No Do you feel safe at home?  Yes  Hearing Difficulties: No Do you often ask people to speak up or repeat themselves? No Do you experience ringing or noises in your ears? No  Do you have difficulty understanding soft or whispered voices? No    Cognitive Testing  Alert? Yes   Normal Appearance? Yes  Oriented to person? Yes  Place? Yes   Time? Yes  Recall of three objects?  Yes  Can perform simple calculations? Yes  Displays appropriate judgment? Yes  Can read the correct time from a watch face? Yes  Do you feel that you have a problem with memory? No  Do you often misplace items? No   Advanced Directives have been discussed with the patient? Yes   Current Physicians/Providers and Suppliers  1. Terri Piedra, FNP - Internal Medicine   Indicate any recent Medical Services you may have received from other than [facility] providers in the past year (date may be approximate).  All answers were reviewed with the patient and necessary referrals were made:  Mauricio Po, [place]   09/09/2016    No Known Allergies   Outpatient Medications Prior to Visit  Medication Sig Dispense Refill  . HYDROcodone-acetaminophen (NORCO/VICODIN) 5-325 MG tablet Take 1 tablet by mouth every 8 (eight) hours as needed for severe pain. 9  tablet 0  . lisinopril-hydrochlorothiazide (PRINZIDE,ZESTORETIC) 20-12.5 MG tablet TAKE 1 TABLET EVERY DAY 90 tablet 1  . naproxen (NAPROSYN) 500 MG tablet TAKE 1 TABLET TWICE DAILY AS NEEDED 180 tablet 2  . omeprazole (PRILOSEC) 20 MG capsule Take 1 capsule (20 mg total) by mouth daily. 90 capsule 0  . pravastatin (PRAVACHOL) 80 MG tablet TAKE 1 TABLET DAILY 90 tablet 3  . VASCEPA 1 g CAPS TAKE 2 CAPSULES TWICE DAILY 360 capsule 0  . amLODipine (NORVASC) 5 MG tablet Take 1 tablet (5 mg total) by mouth daily. 90 tablet 0   No facility-administered medications prior to visit.      Past Medical History:  Diagnosis Date  . GERD (gastroesophageal  reflux disease)   . Hyperlipemia   . Hypertension      History reviewed. No pertinent surgical history.   Family History  Problem Relation Age of Onset  . Diabetes Mother   . Diabetes Maternal Grandmother      Social History   Social History  . Marital status: Single    Spouse name: N/A  . Number of children: 4  . Years of education: 14   Occupational History  . Retired    Social History Main Topics  . Smoking status: Current Every Day Smoker    Packs/day: 0.50    Types: Cigars  . Smokeless tobacco: Never Used  . Alcohol use 0.0 oz/week     Comment: Occasionally  . Drug use: No  . Sexual activity: Not on file   Other Topics Concern  . Not on file   Social History Narrative   Fun: Swimming, and outdoor sports   Denies religious beliefs effecting health care.      Review of Systems  Constitutional: Denies fever, chills, fatigue, or significant weight gain/loss. HENT: Head: Denies headache or neck pain Ears: Denies changes in hearing, ringing in ears, earache, drainage Nose: Denies discharge, stuffiness, itching, nosebleed, sinus pain Throat: Denies sore throat, hoarseness, dry mouth, sores, thrush Eyes: Denies loss/changes in vision, pain, redness, blurry/double vision, flashing lights Cardiovascular: Denies chest pain/discomfort, tightness, palpitations, shortness of breath with activity, difficulty lying down, swelling, sudden awakening with shortness of breath Respiratory: Denies shortness of breath, cough, sputum production, wheezing Gastrointestinal: Denies dysphasia, heartburn, change in appetite, nausea, change in bowel habits, rectal bleeding, constipation, diarrhea, yellow skin or eyes Genitourinary: Denies frequency, urgency, burning/pain, blood in urine, incontinence, change in urinary strength. Musculoskeletal: Denies muscle/joint pain, stiffness, back pain, redness or swelling of joints, trauma Skin: Denies rashes, lumps, itching, dryness, color  changes, or hair/nail changes Neurological: Denies dizziness, fainting, seizures, weakness, numbness, tingling, tremor Psychiatric - Denies nervousness, stress, depression or memory loss Endocrine: Denies heat or cold intolerance, sweating, frequent urination, excessive thirst, changes in appetite Hematologic: Denies ease of bruising or bleeding    Objective:     BP (!) 152/94 (BP Location: Left Arm, Patient Position: Sitting, Cuff Size: Normal)   Pulse 73   Temp 98 F (36.7 C) (Oral)   Resp 16   Ht '5\' 10"'$  (1.778 m)   Wt 179 lb (81.2 kg)   SpO2 99%   BMI 25.68 kg/m  Nursing note and vital signs reviewed.   Physical Exam  Constitutional: He is oriented to person, place, and time. He appears well-developed and well-nourished.  HENT:  Head: Normocephalic.  Right Ear: Hearing, tympanic membrane, external ear and ear canal normal.  Left Ear: Hearing, tympanic membrane, external ear and ear canal normal.  Nose: Nose normal.  Mouth/Throat: Uvula is midline, oropharynx is clear and moist and mucous membranes are normal.  Eyes: Conjunctivae and EOM are normal. Pupils are equal, round, and reactive to light.  Neck: Neck supple. No JVD present. No tracheal deviation present. No thyromegaly present.  Cardiovascular: Normal rate, regular rhythm, normal heart sounds and intact distal pulses.   Pulmonary/Chest: Effort normal and breath sounds normal.  Abdominal: Soft. Bowel sounds are normal. He exhibits no distension and no mass. There is no tenderness. There is no rebound and no guarding.  Musculoskeletal: Normal range of motion. He exhibits no edema or tenderness.  Lymphadenopathy:    He has no cervical adenopathy.  Neurological: He is alert and oriented to person, place, and time. He has normal reflexes. No cranial nerve deficit. He exhibits normal muscle tone. Coordination normal.  Skin: Skin is warm and dry.  Psychiatric: He has a normal mood and affect. His behavior is normal.  Judgment and thought content normal.       Assessment & Plan:   During the course of the visit the patient was educated and counseled about appropriate screening and preventive services including:    Pneumococcal vaccine   Influenza vaccine  Screening electrocardiogram  Nutrition counseling   Smoking cessation counseling  Diet review for nutrition referral? Yes ____  Not Indicated _X___   Patient Instructions (the written plan) was given to the patient.  Medicare Attestation I have personally reviewed: The patient's medical and social history Their use of alcohol, tobacco or illicit drugs Their current medications and supplements The patient's functional ability including ADLs,fall risks, home safety risks, cognitive, and hearing and visual impairment Diet and physical activities Evidence for depression or mood disorders  The patient's weight, height, BMI,  have been recorded in the chart.  I have made referrals, counseling, and provided education to the patient based on review of the above and I have provided the patient with a written personalized care plan for preventive services.     Problem List Items Addressed This Visit      Cardiovascular and Mediastinum   Essential hypertension    Hypertension remains labile and above goal 140/90 with current regimen and no adverse side effects. Continue current dosage of lisinopril-hydrochlorothiazide. Increase amlodipine. Denies worse headache of life or new symptoms of end organ damage. Encouraged to decrease sodium in diet. Monitor blood pressure at home.      Relevant Medications   amLODipine (NORVASC) 10 MG tablet     Other   Hyperlipidemia    Currently maintained on pravastatin with no adverse side effects or myalgias. Updated status with new lipid profile results. Continue current dosage of pravastatin pending results.      Relevant Medications   amLODipine (NORVASC) 10 MG tablet   Medicare annual wellness visit,  subsequent - Primary    Reviewed and updated patient's medical, surgical, family and social history. Medications and allergies were also reviewed. Basic screenings for depression, activities of daily living, hearing, cognition and safety were performed. Provider list was updated and health plan was provided to the patient.      Routine general medical examination at a health care facility    1) Anticipatory Guidance: Discussed importance of wearing a seatbelt while driving and not texting while driving; changing batteries in smoke detector at least once annually; wearing suntan lotion when outside; eating a balanced and moderate diet; getting physical activity at least 30 minutes per day.  2) Immunizations / Screenings / Labs:  Tetanus and flu updated  today. Due for pneumonia vaccination with plan discussed.  All other immunizations are up to date per recommendations. Due for colon cancer screening with referral to GI placed. Due for prostate cancer screening with PSA ordered. Obtain Hepatitis C antibody for hepatitis C screening. All other screenings are up to date per recommendations. Obtain CBC, CMET, and lipid profile.    Overall well exam with risk factors for cardiovascular and chronic disease including hyperlipidemia and hypertension. Blood pressure remains elevated today. Increase amlodipine to 10 mg. Hyperlipidemia maintained on medication. Update and changes with lipid profile results. Continue other healthy lifestyle behaviors and choices. Follow-up prevention exam in 1 year. Follow-up office visit pending blood work and for chronic conditions.       Relevant Orders   Ambulatory referral to Gastroenterology   CBC   Comprehensive metabolic panel   Lipid panel   PSA   Hepatitis C antibody    Other Visit Diagnoses    Encounter for immunization       Relevant Medications   amLODipine (NORVASC) 10 MG tablet   Other Relevant Orders   Ambulatory referral to Gastroenterology   CBC    Comprehensive metabolic panel   Lipid panel   PSA   Hepatitis C antibody   Flu vaccine HIGH DOSE PF (Completed)   Need for diphtheria-tetanus-pertussis (Tdap) vaccine       Relevant Orders   Tdap vaccine greater than or equal to 7yo IM (Completed)       I have changed Mr. Tritz's amLODipine. I am also having him maintain his omeprazole, HYDROcodone-acetaminophen, naproxen, VASCEPA, pravastatin, and lisinopril-hydrochlorothiazide.   Meds ordered this encounter  Medications  . amLODipine (NORVASC) 10 MG tablet    Sig: Take 1 tablet (10 mg total) by mouth daily.    Dispense:  90 tablet    Refill:  0    Order Specific Question:   Supervising Provider    Answer:   Pricilla Holm A [[id number]]     Follow-up: Return in about 3 months (around 12/10/2016), or if symptoms worsen or fail to improve.   Mauricio Po, FNP

## 2016-09-09 NOTE — Assessment & Plan Note (Signed)
Hypertension remains labile and above goal 140/90 with current regimen and no adverse side effects. Continue current dosage of lisinopril-hydrochlorothiazide. Increase amlodipine. Denies worse headache of life or new symptoms of end organ damage. Encouraged to decrease sodium in diet. Monitor blood pressure at home.

## 2016-09-09 NOTE — Assessment & Plan Note (Signed)
Currently maintained on pravastatin with no adverse side effects or myalgias. Updated status with new lipid profile results. Continue current dosage of pravastatin pending results.

## 2016-09-09 NOTE — Assessment & Plan Note (Signed)
Reviewed and updated patient's medical, surgical, family and social history. Medications and allergies were also reviewed. Basic screenings for depression, activities of daily living, hearing, cognition and safety were performed. Provider list was updated and health plan was provided to the patient.  

## 2016-09-09 NOTE — Assessment & Plan Note (Signed)
1) Anticipatory Guidance: Discussed importance of wearing a seatbelt while driving and not texting while driving; changing batteries in smoke detector at least once annually; wearing suntan lotion when outside; eating a balanced and moderate diet; getting physical activity at least 30 minutes per day.  2) Immunizations / Screenings / Labs:  Tetanus and flu updated today. Due for pneumonia vaccination with plan discussed.  All other immunizations are up to date per recommendations. Due for colon cancer screening with referral to GI placed. Due for prostate cancer screening with PSA ordered. Obtain Hepatitis C antibody for hepatitis C screening. All other screenings are up to date per recommendations. Obtain CBC, CMET, and lipid profile.    Overall well exam with risk factors for cardiovascular and chronic disease including hyperlipidemia and hypertension. Blood pressure remains elevated today. Increase amlodipine to 10 mg. Hyperlipidemia maintained on medication. Update and changes with lipid profile results. Continue other healthy lifestyle behaviors and choices. Follow-up prevention exam in 1 year. Follow-up office visit pending blood work and for chronic conditions.

## 2016-09-09 NOTE — Patient Instructions (Signed)
Thank you for choosing [facility].  SUMMARY AND INSTRUCTIONS:  Please follow up in 1 month for a Pneumonia Vaccination which can be completed as a nurse visit.   Medication:  Your prescription(s) have been submitted to your pharmacy or been printed and provided for you. Please take as directed and contact our office if you believe you are having problem(s) with the medication(s) or have any questions.  Labs:  Please stop by the lab on the lower level of the building for your blood work. Your results will be released to [facility] (or called to you) after review, usually within 72 hours after test completion. If any changes need to be made, you will be notified at that same time.  1.) The lab is open from 7:30am to 5:30 pm Monday-Friday 2.) No appointment is necessary 3.) Fasting (if needed) is 6-8 hours after food and drink; black coffee and water are okay    Health Maintenance  Topic Date Due  . Hepatitis C Screening  Sep 01, 1946  . TETANUS/TDAP  06/13/1967  . COLONOSCOPY  06/12/1998  . ZOSTAVAX  06/12/2008  . PNA vac Low Risk Adult (1 of 2 - PCV13) 06/12/2013  . INFLUENZA VACCINE  06/28/2016   Health Maintenance, Male A healthy lifestyle and preventative care can promote health and wellness.  Maintain regular health, dental, and eye exams.  Eat a healthy diet. Foods like vegetables, fruits, whole grains, low-fat dairy products, and lean protein foods contain the nutrients you need and are low in calories. Decrease your intake of foods high in solid fats, added sugars, and salt. Get information about a proper diet from your health care provider, if necessary.  Regular physical exercise is one of the most important things you can do for your health. Most adults should get at least 150 minutes of moderate-intensity exercise (any activity that increases your heart rate and causes you to sweat) each week. In addition, most adults need muscle-strengthening exercises on 2 or more  days a week.   Maintain a healthy weight. The body mass index (BMI) is a screening tool to identify possible weight problems. It provides an estimate of body fat based on height and weight. Your health care provider can find your BMI and can help you achieve or maintain a healthy weight. For males 20 years and older:  A BMI below 18.5 is considered underweight.  A BMI of 18.5 to 24.9 is normal.  A BMI of 25 to 29.9 is considered overweight.  A BMI of 30 and above is considered obese.  Maintain normal blood lipids and cholesterol by exercising and minimizing your intake of saturated fat. Eat a balanced diet with plenty of fruits and vegetables. Blood tests for lipids and cholesterol should begin at age 44 and be repeated every 5 years. If your lipid or cholesterol levels are high, you are over age 99, or you are at high risk for heart disease, you may need your cholesterol levels checked more frequently.Ongoing high lipid and cholesterol levels should be treated with medicines if diet and exercise are not working.  If you smoke, find out from your health care provider how to quit. If you do not use tobacco, do not start.  Lung cancer screening is recommended for adults aged 39-80 years who are at high risk for developing lung cancer because of a history of smoking. A yearly low-dose CT scan of the lungs is recommended for people who have at least a 30-pack-year history of smoking and are current  smokers or have quit within the past 15 years. A pack year of smoking is smoking an average of 1 pack of cigarettes a day for 1 year (for example, a 30-pack-year history of smoking could mean smoking 1 pack a day for 30 years or 2 packs a day for 15 years). Yearly screening should continue until the smoker has stopped smoking for at least 15 years. Yearly screening should be stopped for people who develop a health problem that would prevent them from having lung cancer treatment.  If you choose to drink  alcohol, do not have more than 2 drinks per day. One drink is considered to be 12 oz (360 mL) of beer, 5 oz (150 mL) of wine, or 1.5 oz (45 mL) of liquor.  Avoid the use of street drugs. Do not share needles with anyone. Ask for help if you need support or instructions about stopping the use of drugs.  High blood pressure causes heart disease and increases the risk of stroke. High blood pressure is more likely to develop in:  People who have blood pressure in the end of the normal range (100-139/85-89 mm Hg).  People who are overweight or obese.  People who are African American.  If you are 75-15 years of age, have your blood pressure checked every 3-5 years. If you are 45 years of age or older, have your blood pressure checked every year. You should have your blood pressure measured twice--once when you are at a hospital or clinic, and once when you are not at a hospital or clinic. Record the average of the two measurements. To check your blood pressure when you are not at a hospital or clinic, you can use:  An automated blood pressure machine at a pharmacy.  A home blood pressure monitor.  If you are 58-83 years old, ask your health care provider if you should take aspirin to prevent heart disease.  Diabetes screening involves taking a blood sample to check your fasting blood sugar level. This should be done once every 3 years after age 29 if you are at a normal weight and without risk factors for diabetes. Testing should be considered at a younger age or be carried out more frequently if you are overweight and have at least 1 risk factor for diabetes.  Colorectal cancer can be detected and often prevented. Most routine colorectal cancer screening begins at the age of 72 and continues through age 57. However, your health care provider may recommend screening at an earlier age if you have risk factors for colon cancer. On a yearly basis, your health care provider may provide home test kits to  check for hidden blood in the stool. A small camera at the end of a tube may be used to directly examine the colon (sigmoidoscopy or colonoscopy) to detect the earliest forms of colorectal cancer. Talk to your health care provider about this at age 18 when routine screening begins. A direct exam of the colon should be repeated every 5-10 years through age 62, unless early forms of precancerous polyps or small growths are found.  People who are at an increased risk for hepatitis B should be screened for this virus. You are considered at high risk for hepatitis B if:  You were born in a country where hepatitis B occurs often. Talk with your health care provider about which countries are considered high risk.  Your parents were born in a high-risk country and you have not received a shot to  protect against hepatitis B (hepatitis B vaccine).  You have HIV or AIDS.  You use needles to inject street drugs.  You live with, or have sex with, someone who has hepatitis B.  You are a man who has sex with other men (MSM).  You get hemodialysis treatment.  You take certain medicines for conditions like cancer, organ transplantation, and autoimmune conditions.  Hepatitis C blood testing is recommended for all people born from [birth year] through 1963 and any individual with known risk factors for hepatitis C.  Healthy men should no longer receive prostate-specific antigen (PSA) blood tests as part of routine cancer screening. Talk to your health care provider about prostate cancer screening.  Testicular cancer screening is not recommended for adolescents or adult males who have no symptoms. Screening includes self-exam, a health care provider exam, and other screening tests. Consult with your health care provider about any symptoms you have or any concerns you have about testicular cancer.  Practice safe sex. Use condoms and avoid high-risk sexual practices to reduce the spread of sexually transmitted  infections (STIs).  You should be screened for STIs, including gonorrhea and chlamydia if:  You are sexually active and are younger than 24 years.  You are older than 24 years, and your health care provider tells you that you are at risk for this type of infection.  Your sexual activity has changed since you were last screened, and you are at an increased risk for chlamydia or gonorrhea. Ask your health care provider if you are at risk.  If you are at risk of being infected with HIV, it is recommended that you take a prescription medicine daily to prevent HIV infection. This is called pre-exposure prophylaxis (PrEP). You are considered at risk if:  You are a man who has sex with other men (MSM).  You are a heterosexual man who is sexually active with multiple partners.  You take drugs by injection.  You are sexually active with a partner who has HIV.  Talk with your health care provider about whether you are at high risk of being infected with HIV. If you choose to begin PrEP, you should first be tested for HIV. You should then be tested every 3 months for as long as you are taking PrEP.  Use sunscreen. Apply sunscreen liberally and repeatedly throughout the day. You should seek shade when your shadow is shorter than you. Protect yourself by wearing long sleeves, pants, a wide-brimmed hat, and sunglasses year round whenever you are outdoors.  Tell your health care provider of new moles or changes in moles, especially if there is a change in shape or color. Also, tell your health care provider if a mole is larger than the size of a pencil eraser.  A one-time screening for abdominal aortic aneurysm (AAA) and surgical repair of large AAAs by ultrasound is recommended for men aged 43-75 years who are current or former smokers.  Stay current with your vaccines (immunizations).   This information is not intended to replace advice given to you by your health care provider. Make sure you  discuss any questions you have with your health care provider.   Document Released: 05/12/2008 Document Revised: 12/05/2014 Document Reviewed: 04/11/2011 Elsevier Interactive Patient Education [employer].

## 2016-09-10 LAB — HEPATITIS C ANTIBODY: HCV AB: NEGATIVE

## 2016-09-12 ENCOUNTER — Other Ambulatory Visit: Payer: Self-pay | Admitting: Family

## 2016-09-12 DIAGNOSIS — R972 Elevated prostate specific antigen [PSA]: Secondary | ICD-10-CM

## 2016-10-10 ENCOUNTER — Ambulatory Visit: Payer: Medicare HMO

## 2016-12-26 ENCOUNTER — Encounter: Payer: Self-pay | Admitting: Family

## 2017-03-20 ENCOUNTER — Other Ambulatory Visit: Payer: Self-pay | Admitting: Family

## 2017-05-19 ENCOUNTER — Other Ambulatory Visit: Payer: Self-pay | Admitting: Family

## 2017-05-19 DIAGNOSIS — M436 Torticollis: Secondary | ICD-10-CM

## 2017-05-19 DIAGNOSIS — G8929 Other chronic pain: Secondary | ICD-10-CM

## 2017-05-19 DIAGNOSIS — M25552 Pain in left hip: Secondary | ICD-10-CM

## 2018-06-06 ENCOUNTER — Ambulatory Visit
Admission: RE | Admit: 2018-06-06 | Discharge: 2018-06-06 | Disposition: A | Discharge status: Home / Self Care | Payer: Medicare HMO | Source: Ambulatory Visit | Attending: Family Medicine | Admitting: Family Medicine

## 2018-06-06 ENCOUNTER — Other Ambulatory Visit: Payer: Self-pay | Admitting: Family Medicine

## 2018-06-06 DIAGNOSIS — D72829 Elevated white blood cell count, unspecified: Secondary | ICD-10-CM

## 2018-06-06 MED ORDER — IOPAMIDOL (ISOVUE-300) INJECTION 61%
100.0000 mL | Freq: Once | INTRAVENOUS | Status: AC | PRN
  Administered 2018-06-06: 100 mL via INTRAVENOUS

## 2018-06-12 ENCOUNTER — Other Ambulatory Visit: Payer: Self-pay

## 2018-06-12 ENCOUNTER — Emergency Department (HOSPITAL_COMMUNITY): Payer: Medicare HMO

## 2018-06-12 ENCOUNTER — Encounter (HOSPITAL_COMMUNITY): Payer: Self-pay

## 2018-06-12 ENCOUNTER — Inpatient Hospital Stay (HOSPITAL_COMMUNITY)
Admission: EM | Admit: 2018-06-12 | Discharge: 2018-06-19 | DRG: 435 | Disposition: A | Discharge status: Home / Self Care | Payer: Medicare HMO | Attending: Internal Medicine | Admitting: Internal Medicine

## 2018-06-12 DIAGNOSIS — K311 Adult hypertrophic pyloric stenosis: Secondary | ICD-10-CM | POA: Diagnosis present

## 2018-06-12 DIAGNOSIS — E440 Moderate protein-calorie malnutrition: Secondary | ICD-10-CM | POA: Diagnosis not present

## 2018-06-12 DIAGNOSIS — Z79891 Long term (current) use of opiate analgesic: Secondary | ICD-10-CM | POA: Diagnosis not present

## 2018-06-12 DIAGNOSIS — K219 Gastro-esophageal reflux disease without esophagitis: Secondary | ICD-10-CM | POA: Diagnosis not present

## 2018-06-12 DIAGNOSIS — Z833 Family history of diabetes mellitus: Secondary | ICD-10-CM | POA: Diagnosis not present

## 2018-06-12 DIAGNOSIS — E785 Hyperlipidemia, unspecified: Secondary | ICD-10-CM | POA: Diagnosis not present

## 2018-06-12 DIAGNOSIS — E860 Dehydration: Secondary | ICD-10-CM | POA: Diagnosis present

## 2018-06-12 DIAGNOSIS — K859 Acute pancreatitis without necrosis or infection, unspecified: Secondary | ICD-10-CM | POA: Diagnosis not present

## 2018-06-12 DIAGNOSIS — R112 Nausea with vomiting, unspecified: Secondary | ICD-10-CM | POA: Diagnosis not present

## 2018-06-12 DIAGNOSIS — Z79899 Other long term (current) drug therapy: Secondary | ICD-10-CM

## 2018-06-12 DIAGNOSIS — I10 Essential (primary) hypertension: Secondary | ICD-10-CM | POA: Diagnosis not present

## 2018-06-12 DIAGNOSIS — Z8249 Family history of ischemic heart disease and other diseases of the circulatory system: Secondary | ICD-10-CM

## 2018-06-12 DIAGNOSIS — C259 Malignant neoplasm of pancreas, unspecified: Secondary | ICD-10-CM | POA: Diagnosis not present

## 2018-06-12 DIAGNOSIS — R1013 Epigastric pain: Secondary | ICD-10-CM

## 2018-06-12 DIAGNOSIS — K5730 Diverticulosis of large intestine without perforation or abscess without bleeding: Secondary | ICD-10-CM | POA: Diagnosis present

## 2018-06-12 DIAGNOSIS — F17210 Nicotine dependence, cigarettes, uncomplicated: Secondary | ICD-10-CM | POA: Diagnosis not present

## 2018-06-12 DIAGNOSIS — K8689 Other specified diseases of pancreas: Secondary | ICD-10-CM

## 2018-06-12 DIAGNOSIS — E278 Other specified disorders of adrenal gland: Secondary | ICD-10-CM | POA: Diagnosis not present

## 2018-06-12 DIAGNOSIS — R161 Splenomegaly, not elsewhere classified: Secondary | ICD-10-CM | POA: Diagnosis not present

## 2018-06-12 DIAGNOSIS — F17290 Nicotine dependence, other tobacco product, uncomplicated: Secondary | ICD-10-CM | POA: Diagnosis present

## 2018-06-12 DIAGNOSIS — R933 Abnormal findings on diagnostic imaging of other parts of digestive tract: Secondary | ICD-10-CM | POA: Diagnosis not present

## 2018-06-12 DIAGNOSIS — I700 Atherosclerosis of aorta: Secondary | ICD-10-CM | POA: Diagnosis present

## 2018-06-12 DIAGNOSIS — Z791 Long term (current) use of non-steroidal anti-inflammatories (NSAID): Secondary | ICD-10-CM

## 2018-06-12 DIAGNOSIS — N179 Acute kidney failure, unspecified: Secondary | ICD-10-CM

## 2018-06-12 DIAGNOSIS — D72829 Elevated white blood cell count, unspecified: Secondary | ICD-10-CM

## 2018-06-12 DIAGNOSIS — E279 Disorder of adrenal gland, unspecified: Secondary | ICD-10-CM | POA: Diagnosis present

## 2018-06-12 DIAGNOSIS — I723 Aneurysm of iliac artery: Secondary | ICD-10-CM | POA: Diagnosis present

## 2018-06-12 DIAGNOSIS — R1011 Right upper quadrant pain: Secondary | ICD-10-CM | POA: Diagnosis not present

## 2018-06-12 DIAGNOSIS — R634 Abnormal weight loss: Secondary | ICD-10-CM | POA: Diagnosis not present

## 2018-06-12 DIAGNOSIS — R109 Unspecified abdominal pain: Secondary | ICD-10-CM | POA: Diagnosis present

## 2018-06-12 DIAGNOSIS — Z86010 Personal history of colonic polyps: Secondary | ICD-10-CM | POA: Diagnosis not present

## 2018-06-12 DIAGNOSIS — C258 Malignant neoplasm of overlapping sites of pancreas: Secondary | ICD-10-CM | POA: Diagnosis not present

## 2018-06-12 DIAGNOSIS — R935 Abnormal findings on diagnostic imaging of other abdominal regions, including retroperitoneum: Secondary | ICD-10-CM | POA: Diagnosis not present

## 2018-06-12 DIAGNOSIS — C799 Secondary malignant neoplasm of unspecified site: Secondary | ICD-10-CM

## 2018-06-12 DIAGNOSIS — I70201 Unspecified atherosclerosis of native arteries of extremities, right leg: Secondary | ICD-10-CM | POA: Diagnosis not present

## 2018-06-12 HISTORY — DX: Malignant (primary) neoplasm, unspecified: C80.1

## 2018-06-12 LAB — COMPREHENSIVE METABOLIC PANEL
ALT: 18 U/L (ref 0–44)
AST: 22 U/L (ref 15–41)
Albumin: 3.1 g/dL — ABNORMAL LOW (ref 3.5–5.0)
Alkaline Phosphatase: 94 U/L (ref 38–126)
Anion gap: 12 (ref 5–15)
BUN: 26 mg/dL — ABNORMAL HIGH (ref 8–23)
CO2: 25 mmol/L (ref 22–32)
Calcium: 9.4 mg/dL (ref 8.9–10.3)
Chloride: 100 mmol/L (ref 98–111)
Creatinine, Ser: 1.47 mg/dL — ABNORMAL HIGH (ref 0.61–1.24)
GFR, EST AFRICAN AMERICAN: 54 mL/min — AB (ref >60)
GFR, EST NON AFRICAN AMERICAN: 47 mL/min — AB (ref >60)
Glucose, Bld: 147 mg/dL — ABNORMAL HIGH (ref 70–99)
POTASSIUM: 4.9 mmol/L (ref 3.5–5.1)
Sodium: 137 mmol/L (ref 135–145)
Total Bilirubin: 0.7 mg/dL (ref 0.3–1.2)
Total Protein: 8.2 g/dL — ABNORMAL HIGH (ref 6.5–8.1)

## 2018-06-12 LAB — URINALYSIS, ROUTINE W REFLEX MICROSCOPIC
Bacteria, UA: NONE SEEN
Bilirubin Urine: NEGATIVE
GLUCOSE, UA: NEGATIVE mg/dL
Hgb urine dipstick: NEGATIVE
KETONES UR: NEGATIVE mg/dL
NITRITE: NEGATIVE
PH: 5 (ref 5.0–8.0)
Protein, ur: 30 mg/dL — AB
Specific Gravity, Urine: 1.023 (ref 1.005–1.030)

## 2018-06-12 LAB — CBC
HEMATOCRIT: 38.9 % — AB (ref 39.0–52.0)
HEMOGLOBIN: 13.4 g/dL (ref 13.0–17.0)
MCH: 33.4 pg (ref 26.0–34.0)
MCHC: 34.4 g/dL (ref 30.0–36.0)
MCV: 97 fL (ref 78.0–100.0)
Platelets: 425 10*3/uL — ABNORMAL HIGH (ref 150–400)
RBC: 4.01 MIL/uL — AB (ref 4.22–5.81)
RDW: 14.8 % (ref 11.5–15.5)
WBC: 14.6 10*3/uL — ABNORMAL HIGH (ref 4.0–10.5)

## 2018-06-12 LAB — I-STAT TROPONIN, ED: TROPONIN I, POC: 0 ng/mL (ref 0.00–0.08)

## 2018-06-12 LAB — PROTIME-INR
INR: 1.26
Prothrombin Time: 15.7 seconds — ABNORMAL HIGH (ref 11.4–15.2)

## 2018-06-12 LAB — LIPASE, BLOOD: Lipase: 22 U/L (ref 11–51)

## 2018-06-12 MED ORDER — ACETAMINOPHEN 325 MG PO TABS
650.0000 mg | ORAL_TABLET | Freq: Four times a day (QID) | ORAL | Status: DC | PRN
  Administered 2018-06-18: 650 mg via ORAL
  Filled 2018-06-12: qty 2

## 2018-06-12 MED ORDER — ONDANSETRON HCL 4 MG PO TABS: 4.0000 mg | ORAL_TABLET | Freq: Four times a day (QID) | ORAL | Status: DC | PRN

## 2018-06-12 MED ORDER — MORPHINE SULFATE (PF) 2 MG/ML IV SOLN: 2.0000 mg | INTRAVENOUS | Status: DC | PRN

## 2018-06-12 MED ORDER — ACETAMINOPHEN 650 MG RE SUPP: 650.0000 mg | Freq: Four times a day (QID) | RECTAL | Status: DC | PRN

## 2018-06-12 MED ORDER — SODIUM CHLORIDE 0.9 % IV SOLN
Freq: Once | INTRAVENOUS | Status: AC
Start: 2018-06-12 — End: 2018-06-12
  Administered 2018-06-12: 17:00:00 via INTRAVENOUS

## 2018-06-12 MED ORDER — SODIUM CHLORIDE 0.9 % IV SOLN
INTRAVENOUS | Status: DC
  Administered 2018-06-12 – 2018-06-14 (×4): via INTRAVENOUS

## 2018-06-12 MED ORDER — ONDANSETRON HCL 4 MG/2ML IJ SOLN
4.0000 mg | Freq: Four times a day (QID) | INTRAMUSCULAR | Status: DC | PRN
  Filled 2018-06-12: qty 2

## 2018-06-12 MED ORDER — HYDROMORPHONE HCL 1 MG/ML IJ SOLN
1.0000 mg | Freq: Once | INTRAMUSCULAR | Status: AC
  Administered 2018-06-12: 1 mg via INTRAVENOUS
  Filled 2018-06-12: qty 1

## 2018-06-12 MED ORDER — ONDANSETRON HCL 4 MG/2ML IJ SOLN
4.0000 mg | Freq: Once | INTRAMUSCULAR | Status: AC
  Administered 2018-06-12: 4 mg via INTRAVENOUS
  Filled 2018-06-12: qty 2

## 2018-06-12 MED ORDER — ENOXAPARIN SODIUM 40 MG/0.4ML ~~LOC~~ SOLN
40.0000 mg | SUBCUTANEOUS | Status: DC
  Administered 2018-06-12: 40 mg via SUBCUTANEOUS
  Filled 2018-06-12: qty 0.4

## 2018-06-12 MED ORDER — SODIUM CHLORIDE 0.9 % IV BOLUS
1000.0000 mL | Freq: Once | INTRAVENOUS | Status: AC
  Administered 2018-06-12: 1000 mL via INTRAVENOUS

## 2018-06-12 NOTE — ED Notes (Signed)
Spoke to Network engineer and they will approve bed and post report time for 20 min after shift change. Will not take report now because of shift change.

## 2018-06-12 NOTE — ED Notes (Signed)
Attempted to call [phone]- no answer.

## 2018-06-12 NOTE — ED Triage Notes (Signed)
Patient c/o mid abdominal pain x 2 weeks. Patient states he was sent to imaging and states that he was told he had kidney and spleen cancer.  Patient also reports that he is having increased gastric reflux.

## 2018-06-12 NOTE — H&P (Signed)
History and Physical    Jeremy Mack EGB:[id number] DOB: [birth year]  DOA: 06/12/2018 PCP: Golden Circle, FNP  Patient coming from: Home  Chief Complaint: Abdominal pain   HPI: Jeremy Mack is a 72 y.o. male with medical history significant of 71 year old male with past medical history of hypertension and GERD presented to the emergency department complaining of worsening abdominal pain for the past 24 to 48 hours.  Symptoms started several weeks ago with nausea and vomiting, associated with weight loss reported about 35 pound and intermittent abdominal pain.  Patient saw his primary care provider who performed a CT scan which is concerning for metastatic pancreatic cancer.  Now suddenly abdominal pain has worsened, he is not able to tolerate oral intake.  No bowel movement since last week.  Patient denies fever, chills, chest pain, shortness of breath and palpitations.  No diarrhea or urinary symptoms.  ED Course: Upon ED evaluation lab work-up shows elevated creatinine to 1.47, intractable abdominal pain with nausea and vomiting.  EDP consulted oncology who recommended GI consult after discussing with metabolic GI recommended admission for possible palliative stenting.  Patient was given IV fluid antiemetic and pain control.  Review of Systems:   All ROS reviewed and negative except the ones mentioned above.   Past Medical History:  Diagnosis Date  . Cancer ([place])   . GERD (gastroesophageal reflux disease)   . Hyperlipemia   . Hypertension     Past Surgical History:  Procedure Laterality Date  . arm fracture surgery Right   . colon polyps removed       reports that he has been smoking cigars.  He has been smoking about 0.50 packs per day. He has never used smokeless tobacco. He reports that he drinks alcohol. He reports that he does not use drugs.  No Known Allergies  Family History  Problem Relation Age of Onset  . Diabetes Mother   . Diabetes Maternal Grandmother      Prior to Admission medications   Medication Sig Start Date End Date Taking? Authorizing Provider  ciprofloxacin (CIPRO) 500 MG tablet TAKE 1 TABLET BY MOUTH EVERY 12 HOURS FOR 10 DAYS 06/08/18  Yes [provider]  metroNIDAZOLE (FLAGYL) 500 MG tablet TAKE 1 TABLET BY MOUTH THREE TIMES A DAY FOR 10 DAYS 06/08/18  Yes [provider]  naproxen sodium (ALEVE) 220 MG tablet Take 440 mg by mouth 2 (two) times daily as needed (pain).   Yes [provider]  omeprazole (PRILOSEC) 20 MG capsule Take 1 capsule (20 mg total) by mouth daily. Patient taking differently: Take 20 mg by mouth every other day.  02/07/16  Yes Golden Circle, FNP  pravastatin (PRAVACHOL) 80 MG tablet TAKE 1 TABLET DAILY 08/17/16  Yes Golden Circle, FNP  amLODipine (NORVASC) 10 MG tablet Take 1 tablet (10 mg total) by mouth daily. Patient not taking: Reported on 06/12/2018 09/09/16   Golden Circle, FNP  HYDROcodone-acetaminophen (NORCO/VICODIN) 5-325 MG tablet Take 1 tablet by mouth every 8 (eight) hours as needed for severe pain. Patient not taking: Reported on 06/12/2018 08/05/16   Frederica Kuster, PA-C  lisinopril-hydrochlorothiazide (PRINZIDE,ZESTORETIC) 20-12.5 MG tablet TAKE 1 TABLET EVERY DAY Patient not taking: Reported on 06/12/2018 03/21/17   Golden Circle, FNP  naproxen (NAPROSYN) 500 MG tablet TAKE 1 TABLET TWICE DAILY AS NEEDED Patient not taking: Reported on 06/12/2018 08/17/16   Golden Circle, FNP  VASCEPA 1 g CAPS TAKE 2 CAPSULES TWICE DAILY Patient not taking:  Reported on 06/12/2018 08/17/16   Golden Circle, FNP    Physical Exam: Vitals:   06/12/18 1630 06/12/18 1700 06/12/18 1728 06/12/18 1730  BP: 118/84 119/83  125/85  Pulse: 96 91  92  Resp: 20 20  16   Temp:      TempSrc:      SpO2: 98% 100% 100% 99%  Weight:      Height:         Constitutional: NAD, calm, comfortable Eyes: PERRL, lids and conjunctivae normal ENMT: Mucous membranes are dry. Posterior  pharynx clear of any exudate or lesions.Neck: normal, supple, no masses, no thyromegaly Respiratory: clear to auscultation bilaterally, no wheezing, no crackles. Normal respiratory effort. Cardiovascular: Regular rate and rhythm, no murmurs / rubs / gallops. No extremity edema. 2+ pedal pulses. No carotid bruits.  Abdomen: Soft, nondistended, tender to palpation in the epigastric area.  Positive BS, no organomegaly Musculoskeletal: no clubbing / cyanosis. No joint deformity upper and lower extremities. Good ROM.  Skin: no rashes, lesions, ulcers. No induration Neurologic: CN 2-12 grossly intact. Psychiatric: Normal judgment and insight. Alert and oriented x 3. Normal mood.    Labs on Admission: I have personally reviewed following labs and imaging studies  CBC: Recent Labs  Lab 06/12/18 1142  WBC 14.6*  HGB 13.4  HCT 38.9*  MCV 97.0  PLT 466*   Basic Metabolic Panel: Recent Labs  Lab 06/12/18 1142  NA 137  K 4.9  CL 100  CO2 25  GLUCOSE 147*  BUN 26*  CREATININE 1.47*  CALCIUM 9.4   GFR: Estimated Creatinine Clearance: 48 mL/min (A) (by C-G formula based on SCr of 1.47 mg/dL (H)). Liver Function Tests: Recent Labs  Lab 06/12/18 1142  AST 22  ALT 18  ALKPHOS 94  BILITOT 0.7  PROT 8.2*  ALBUMIN 3.1*   Recent Labs  Lab 06/12/18 1142  LIPASE 22   No results for input(s): AMMONIA in the last 168 hours. Coagulation Profile: Recent Labs  Lab 06/12/18 1552  INR 1.26   Cardiac Enzymes: No results for input(s): CKTOTAL, CKMB, CKMBINDEX, TROPONINI in the last 168 hours. BNP (last 3 results) No results for input(s): PROBNP in the last 8760 hours. HbA1C: No results for input(s): HGBA1C in the last 72 hours. CBG: No results for input(s): GLUCAP in the last 168 hours. Lipid Profile: No results for input(s): CHOL, HDL, LDLCALC, TRIG, CHOLHDL, LDLDIRECT in the last 72 hours. Thyroid Function Tests: No results for input(s): TSH, T4TOTAL, FREET4, T3FREE, THYROIDAB  in the last 72 hours. Anemia Panel: No results for input(s): VITAMINB12, FOLATE, FERRITIN, TIBC, IRON, RETICCTPCT in the last 72 hours. Urine analysis:    Component Value Date/Time   COLORURINE YELLOW 06/12/2018 1152   APPEARANCEUR CLEAR 06/12/2018 1152   LABSPEC 1.023 06/12/2018 1152   PHURINE 5.0 06/12/2018 1152   GLUCOSEU NEGATIVE 06/12/2018 1152   HGBUR NEGATIVE 06/12/2018 1152   BILIRUBINUR NEGATIVE 06/12/2018 1152   KETONESUR NEGATIVE 06/12/2018 1152   PROTEINUR 30 (A) 06/12/2018 1152   NITRITE NEGATIVE 06/12/2018 1152   LEUKOCYTESUR TRACE (A) 06/12/2018 1152   Sepsis Labs: !!!!!!!!!!!!!!!!!!!!!!!!!!!!!!!!!!!!!!!!!!!! @LABRCNTIP (procalcitonin:4,lacticidven:4) )No results found for this or any previous visit (from the past 240 hour(s)).   Radiological Exams on Admission: Dg Abdomen Acute W/chest  Result Date: 06/12/2018 CLINICAL DATA:  Abdominal pain and vomiting EXAM: DG ABDOMEN ACUTE W/ 1V CHEST COMPARISON:  CT abdomen and pelvis 06/06/2018 FINDINGS: Normal heart size, mediastinal contours, and pulmonary vascularity. Lungs clear. No pleural effusion or pneumothorax.  Retained contrast in small bowel and colon from recent CT. No bowel dilatation, bowel wall thickening, or free air. Bones unremarkable. IMPRESSION: No acute abnormalities. Electronically Signed   By: Lavonia Dana M.D.   On: 06/12/2018 18:21    Assessment/Plan Abdominal pain, nausea and vomiting Likely related to metastatic pancreatic cancer, lipase negative. Admit to MedSurg, [place] spoke with [facility] who recommended admission for  possible palliative stenting.  Will continue conservative management, with IV fluids, Zofran, and morphine for pain control.  Will start on clear liquids for now.  AKI  Likely from dehydration, no recent labs on system so unknown baseline. Request records from PCP Continue IVF, avoid nephrotoxic agents and hypotension.   New diagnosis of metastatic pancreatic cancer. CT scan  performed on 7/10 IMPRESSION: 1. Macrocystic neoplasm involving the proximal body of the pancreas with measurements given above. 2. Likely metastatic locoregional lymphadenopathy as detailed above. 3. Solid mass involving the LEFT adrenal gland, likely metastatic disease. 4. Solid mass involving the spleen, possibly representing metastatic disease. 5. Likely occlusion of the SUPERIOR mesenteric vein and the splenic vein due to the mass. The mass surrounds the celiac artery at its bifurcation and surrounds the LEFT gastric artery and the proper hepatic artery. 6. Mild intra and extrahepatic biliary ductal dilation. 7. Hepatic cirrhosis is suspected. Geographic steatosis involving the RIGHT lobe of the liver. No hepatic parenchymal masses. 8. Diffuse colonic diverticulosis without evidence of acute Diverticulitis. 9. Moderate prostate gland enlargement with heterogeneous nodular enhancement. The largest enhancing nodule is in the median lobe. 10. BILATERAL common iliac artery aneurysms, 1.7 cm on the RIGHT and 1.9 cm on the LEFT. 11. Occlusion of the RIGHT superficial femoral artery at its origin. 12.  Aortic Atherosclerosis   EDP consulted case with oncology, recommended GI consult. Patient does not seem to understand well the diagnosis. Patient agrees to partial code status. Will need palliative care consult eventually.   HTN Currently not taking antihypertensive medications. BP stable. Continue to monitor for now  Leukocytosis  Due to volume contraction. No signs of infection, patient was on cipro/flagyl PTA. Will hold abx at this time.  DVT prophylaxis: Lovenox  Code Status: Partial Code no intubation  Family Communication: None at bedside  Disposition Plan: Anticipate discharge to previous home environment.  Consults called: EDP called GI  Admission status: Medsurg/Inpatient   Chipper Oman MD [facility] Pager: Text Page via [email or web address]  [phone]  If 7PM-7AM, please contact  night-coverage [email or web address] Password [facility]  06/12/2018, 6:29 PM

## 2018-06-12 NOTE — ED Notes (Signed)
ED TO INPATIENT HANDOFF REPORT  Name/Age/Gender Jeremy Mack 72 y.o. male  Code Status    Code Status Orders  (From admission, onward)        Start     Ordered   06/12/18 1828  Limited resuscitation (code)  Continuous    Question Answer Comment  In the event of cardiac or respiratory ARREST: Initiate Code Blue, Call Rapid Response Yes   In the event of cardiac or respiratory ARREST: Perform CPR Yes   In the event of cardiac or respiratory ARREST: Perform Intubation/Mechanical Ventilation No   In the event of cardiac or respiratory ARREST: Use NIPPV/BiPAp only if indicated Yes   In the event of cardiac or respiratory ARREST: Administer ACLS medications if indicated Yes   In the event of cardiac or respiratory ARREST: Perform Defibrillation or Cardioversion if indicated Yes      06/12/18 1829    Code Status History    This patient has a current code status but no historical code status.      Home/SNF/Other Home  Chief Complaint Abd Pain  Level of Care/Admitting Diagnosis ED Disposition    ED Disposition Condition [facility] Area: [facility] [[id number]]  Level of Care: Med-Surg [16]  Diagnosis: Abdominal pain [413244]  Admitting Physician: Doreatha Lew [[id number]]  Attending Physician: Patrecia Pour, EDWIN [[id number]]  Estimated length of stay: past midnight tomorrow  Certification:: I certify this patient will need inpatient services for at least 2 midnights  PT Class (Do Not Modify): Inpatient [101]  PT Acc Code (Do Not Modify): Private [1]       Medical History Past Medical History:  Diagnosis Date  . Cancer ([place])   . GERD (gastroesophageal reflux disease)   . Hyperlipemia   . Hypertension     Allergies No Known Allergies  IV Location/Drains/Wounds Patient Lines/Drains/Airways Status   Active Line/Drains/Airways    Name:   Placement date:   Placement time:   Site:   Days:   Peripheral IV 06/06/18 Left Antecubital    06/06/18    1135    Antecubital   6   Peripheral IV 06/12/18 Right Forearm   06/12/18    1550    Forearm   less than 1          Labs/Imaging Results for orders placed or performed during the hospital encounter of 06/12/18 (from the past 48 hour(s))  Lipase, blood     Status: None   Collection Time: 06/12/18 11:42 AM  Result Value Ref Range   Lipase 22 11 - 51 U/L    Comment: Performed at [facility], [place] [address] [place], [place] [address]  Comprehensive metabolic panel     Status: Abnormal   Collection Time: 06/12/18 11:42 AM  Result Value Ref Range   Sodium 137 135 - 145 mmol/L   Potassium 4.9 3.5 - 5.1 mmol/L   Chloride 100 98 - 111 mmol/L    Comment: Please note change in reference range.   CO2 25 22 - 32 mmol/L   Glucose, Bld 147 (H) 70 - 99 mg/dL    Comment: Please note change in reference range.   BUN 26 (H) 8 - 23 mg/dL    Comment: Please note change in reference range.   Creatinine, Ser 1.47 (H) 0.61 - 1.24 mg/dL   Calcium 9.4 8.9 - 10.3 mg/dL   Total Protein 8.2 (H) 6.5 - 8.1 g/dL   Albumin 3.1 (L) 3.5 -  5.0 g/dL   AST 22 15 - 41 U/L   ALT 18 0 - 44 U/L    Comment: Please note change in reference range.   Alkaline Phosphatase 94 38 - 126 U/L   Total Bilirubin 0.7 0.3 - 1.2 mg/dL   GFR calc non Af Amer 47 (L) >60 mL/min   GFR calc Af Amer 54 (L) >60 mL/min    Comment: (NOTE) The eGFR has been calculated using the CKD EPI equation. This calculation has not been validated in all clinical situations. eGFR's persistently <60 mL/min signify possible Chronic Kidney Disease.    Anion gap 12 5 - 15    Comment: Performed at [facility], [place] [address] [place], [place] [address]  CBC     Status: Abnormal   Collection Time: 06/12/18 11:42 AM  Result Value Ref Range   WBC 14.6 (H) 4.0 - 10.5 K/uL   RBC 4.01 (L) 4.22 - 5.81 MIL/uL   Hemoglobin 13.4 13.0 - 17.0 g/dL   HCT 38.9 (L) 39.0 - 52.0 %   MCV 97.0 78.0 - 100.0 fL    MCH 33.4 26.0 - 34.0 pg   MCHC 34.4 30.0 - 36.0 g/dL   RDW 14.8 11.5 - 15.5 %   Platelets 425 (H) 150 - 400 K/uL    Comment: Performed at [facility], [place] [address], [place], [place] [address]  Urinalysis, Routine w reflex microscopic     Status: Abnormal   Collection Time: 06/12/18 11:52 AM  Result Value Ref Range   Color, Urine YELLOW YELLOW   APPearance CLEAR CLEAR   Specific Gravity, Urine 1.023 1.005 - 1.030   pH 5.0 5.0 - 8.0   Glucose, UA NEGATIVE NEGATIVE mg/dL   Hgb urine dipstick NEGATIVE NEGATIVE   Bilirubin Urine NEGATIVE NEGATIVE   Ketones, ur NEGATIVE NEGATIVE mg/dL   Protein, ur 30 (A) NEGATIVE mg/dL   Nitrite NEGATIVE NEGATIVE   Leukocytes, UA TRACE (A) NEGATIVE   RBC / HPF 0-5 0 - 5 RBC/hpf   WBC, UA 0-5 0 - 5 WBC/hpf   Bacteria, UA NONE SEEN NONE SEEN   Squamous Epithelial / LPF 0-5 0 - 5   Mucus PRESENT    Hyaline Casts, UA PRESENT     Comment: Performed at [facility], [place] [address], [place], [place] [address]  I-Stat Troponin, ED (not at [facility])     Status: None   Collection Time: 06/12/18  3:49 PM  Result Value Ref Range   Troponin i, poc 0.00 0.00 - 0.08 ng/mL   Comment 3            Comment: Due to the release kinetics of cTnI, a negative result within the first hours of the onset of symptoms does not rule out myocardial infarction with certainty. If myocardial infarction is still suspected, repeat the test at appropriate intervals.   Protime-INR     Status: Abnormal   Collection Time: 06/12/18  3:52 PM  Result Value Ref Range   Prothrombin Time 15.7 (H) 11.4 - 15.2 seconds   INR 1.26     Comment: Performed at [facility], [place] [address], [place], [place] [address]   Dg Abdomen Acute W/chest  Result Date: 06/12/2018 CLINICAL DATA:  Abdominal pain and vomiting EXAM: DG ABDOMEN ACUTE W/ 1V CHEST COMPARISON:  CT abdomen and pelvis 06/06/2018 FINDINGS: Normal heart size, mediastinal contours,  and pulmonary vascularity. Lungs clear. No pleural effusion or pneumothorax. Retained contrast  in small bowel and colon from recent CT. No bowel dilatation, bowel wall thickening, or free air. Bones unremarkable. IMPRESSION: No acute abnormalities. Electronically Signed   By: Lavonia Dana M.D.   On: 06/12/2018 18:21    Pending Labs Unresulted Labs (From admission, onward)   Start     Ordered   06/19/18 0500  Creatinine, serum  (enoxaparin (LOVENOX)    CrCl >/= 30 ml/min)  Weekly,   R    Comments:  while on enoxaparin therapy    06/12/18 1829   06/13/18 0500  Comprehensive metabolic panel  Tomorrow morning,   R     06/12/18 1829   06/13/18 0500  CBC  Tomorrow morning,   R     06/12/18 1829   06/12/18 1828  HIV antibody (Routine Testing)  Once,   R     06/12/18 1829      Vitals/Pain Today's Vitals   06/12/18 1705 06/12/18 1728 06/12/18 1730 06/12/18 1830  BP:   125/85 126/86  Pulse:   92 94  Resp:   16 20  Temp:      TempSrc:      SpO2:  100% 99% 100%  Weight:      Height:      PainSc: 7        Isolation Precautions No active isolations  Medications Medications  enoxaparin (LOVENOX) injection 40 mg (has no administration in time range)  morphine 2 MG/ML injection 2 mg (has no administration in time range)  acetaminophen (TYLENOL) tablet 650 mg (has no administration in time range)    Or  acetaminophen (TYLENOL) suppository 650 mg (has no administration in time range)  ondansetron (ZOFRAN) tablet 4 mg (has no administration in time range)    Or  ondansetron (ZOFRAN) injection 4 mg (has no administration in time range)  sodium chloride 0.9 % bolus 1,000 mL (0 mLs Intravenous Stopped 06/12/18 1656)  HYDROmorphone (DILAUDID) injection 1 mg (1 mg Intravenous Given 06/12/18 1548)  ondansetron (ZOFRAN) injection 4 mg (4 mg Intravenous Given 06/12/18 1548)  0.9 %  sodium chloride infusion ( Intravenous New Bag/Given 06/12/18 1704)    Mobility walks

## 2018-06-12 NOTE — ED Provider Notes (Signed)
[facility] DEPT Provider Note   CSN: [id number] Arrival date & time: 06/12/18  0076     History   Chief Complaint Chief Complaint  Patient presents with  . Abdominal Pain  . gastric reflux    HPI Jeremy Mack is a 72 y.o. male.  HPI   73 year old male with past medical history as below here with abdominal pain and vomiting.  The patient's states that over the last several weeks, he said progressive worsening nausea, fullness, and vomiting.  He has been having difficulty eating.  He switched to a clear liquid diet over the last week, but is not married to drink for the last several days.  He went to his PCP and had a CT scan, which is concerning for metastatic pancreatic cancer.  He has never had a tissue diagnosis.  This is all new to him.  He reports over the last 24 to 48 hours, he said progressive worsening aching, throbbing, severe abdominal pain with difficulty and now inability to tolerate p.o.  He has not had a bowel movement in the last week.  Denies any fevers or chills.  No urinary symptoms.  Pain is worse with eating and palpation.  Relieving factors.  Past Medical History:  Diagnosis Date  . Cancer ([place])   . GERD (gastroesophageal reflux disease)   . Hyperlipemia   . Hypertension     Patient Active Problem List   Diagnosis Date Noted  . Medicare annual wellness visit, subsequent 09/09/2016  . Routine general medical examination at a health care facility 09/09/2016  . Hyperlipidemia 09/07/2015  . Neck stiffness 05/20/2015  . Hip pain, chronic 05/20/2015  . Essential hypertension 05/20/2015    Past Surgical History:  Procedure Laterality Date  . arm fracture surgery Right   . colon polyps removed          Home Medications    Prior to Admission medications   Medication Sig Start Date End Date Taking? Authorizing Provider  ciprofloxacin (CIPRO) 500 MG tablet TAKE 1 TABLET BY MOUTH EVERY 12 HOURS FOR 10 DAYS 06/08/18  Yes  [provider]  metroNIDAZOLE (FLAGYL) 500 MG tablet TAKE 1 TABLET BY MOUTH THREE TIMES A DAY FOR 10 DAYS 06/08/18  Yes [provider]  naproxen sodium (ALEVE) 220 MG tablet Take 440 mg by mouth 2 (two) times daily as needed (pain).   Yes [provider]  omeprazole (PRILOSEC) 20 MG capsule Take 1 capsule (20 mg total) by mouth daily. Patient taking differently: Take 20 mg by mouth every other day.  02/07/16  Yes Golden Circle, FNP  pravastatin (PRAVACHOL) 80 MG tablet TAKE 1 TABLET DAILY 08/17/16  Yes Golden Circle, FNP  amLODipine (NORVASC) 10 MG tablet Take 1 tablet (10 mg total) by mouth daily. Patient not taking: Reported on 06/12/2018 09/09/16   Golden Circle, FNP  HYDROcodone-acetaminophen (NORCO/VICODIN) 5-325 MG tablet Take 1 tablet by mouth every 8 (eight) hours as needed for severe pain. Patient not taking: Reported on 06/12/2018 08/05/16   Frederica Kuster, PA-C  lisinopril-hydrochlorothiazide (PRINZIDE,ZESTORETIC) 20-12.5 MG tablet TAKE 1 TABLET EVERY DAY Patient not taking: Reported on 06/12/2018 03/21/17   Golden Circle, FNP  naproxen (NAPROSYN) 500 MG tablet TAKE 1 TABLET TWICE DAILY AS NEEDED Patient not taking: Reported on 06/12/2018 08/17/16   Golden Circle, FNP  VASCEPA 1 g CAPS TAKE 2 CAPSULES TWICE DAILY Patient not taking: Reported on 06/12/2018 08/17/16   Golden Circle, FNP  Family History Family History  Problem Relation Age of Onset  . Diabetes Mother   . Diabetes Maternal Grandmother     Social History Social History   Tobacco Use  . Smoking status: Current Every Day Smoker    Packs/day: 0.50    Types: Cigars  . Smokeless tobacco: Never Used  Substance Use Topics  . Alcohol use: Yes    Alcohol/week: 0.0 oz    Comment: Occasionally  . Drug use: No     Allergies   Patient has no known allergies.   Review of Systems Review of Systems  Constitutional: Positive for fatigue. Negative for chills and fever.    HENT: Negative for congestion and rhinorrhea.   Eyes: Negative for visual disturbance.  Respiratory: Negative for cough, shortness of breath and wheezing.   Cardiovascular: Negative for chest pain and leg swelling.  Gastrointestinal: Positive for abdominal distention, abdominal pain, nausea and vomiting. Negative for diarrhea.  Genitourinary: Negative for dysuria and flank pain.  Musculoskeletal: Negative for neck pain and neck stiffness.  Skin: Negative for rash and wound.  Allergic/Immunologic: Negative for immunocompromised state.  Neurological: Positive for weakness. Negative for syncope and headaches.  All other systems reviewed and are negative.    Physical Exam Updated Vital Signs BP 119/83   Pulse 91   Temp 98.6 F (37 C) (Oral)   Resp 20   Ht 5\' 10"  (1.778 m)   Wt 72.6 kg (160 lb)   SpO2 100%   BMI 22.96 kg/m   Physical Exam  Constitutional: He is oriented to person, place, and time. He appears well-developed and well-nourished. He appears distressed.  HENT:  Head: Normocephalic and atraumatic.  Eyes: Conjunctivae are normal.  Neck: Neck supple.  Cardiovascular: Normal rate, regular rhythm and normal heart sounds. Exam reveals no friction rub.  No murmur heard. Pulmonary/Chest: Effort normal and breath sounds normal. No respiratory distress. He has no wheezes. He has no rales.  Abdominal: Soft. Normal appearance. He exhibits no distension. There is generalized tenderness. There is guarding. There is no rigidity and no rebound.  Musculoskeletal: He exhibits no edema.  Neurological: He is alert and oriented to person, place, and time. He exhibits normal muscle tone.  Skin: Skin is warm. Capillary refill takes less than 2 seconds.  Psychiatric: He has a normal mood and affect.  Nursing note and vitals reviewed.    ED Treatments / Results  Labs (all labs ordered are listed, but only abnormal results are displayed) Labs Reviewed  COMPREHENSIVE METABOLIC PANEL -  Abnormal; Notable for the following components:      Result Value   Glucose, Bld 147 (*)    BUN 26 (*)    Creatinine, Ser 1.47 (*)    Total Protein 8.2 (*)    Albumin 3.1 (*)    GFR calc non Af Amer 47 (*)    GFR calc Af Amer 54 (*)    All other components within normal limits  CBC - Abnormal; Notable for the following components:   WBC 14.6 (*)    RBC 4.01 (*)    HCT 38.9 (*)    Platelets 425 (*)    All other components within normal limits  URINALYSIS, ROUTINE W REFLEX MICROSCOPIC - Abnormal; Notable for the following components:   Protein, ur 30 (*)    Leukocytes, UA TRACE (*)    All other components within normal limits  PROTIME-INR - Abnormal; Notable for the following components:   Prothrombin Time 15.7 (*)  All other components within normal limits  LIPASE, BLOOD  I-STAT TROPONIN, ED    EKG EKG Interpretation  Date/Time:  Tuesday June 12 2018 15:37:32 EDT Ventricular Rate:  106 PR Interval:    QRS Duration: 84 QT Interval:  341 QTC Calculation: 453 R Axis:   21 Text Interpretation:  Sinus tachycardia Anteroseptal infarct, age indeterminate No significant change since last tracing Confirmed by Duffy Bruce [phone]) on 06/12/2018 5:14:22 PM   Radiology No results found.  Procedures Procedures (including critical care time)  Medications Ordered in ED Medications  sodium chloride 0.9 % bolus 1,000 mL (0 mLs Intravenous Stopped 06/12/18 1656)  HYDROmorphone (DILAUDID) injection 1 mg (1 mg Intravenous Given 06/12/18 1548)  ondansetron (ZOFRAN) injection 4 mg (4 mg Intravenous Given 06/12/18 1548)  0.9 %  sodium chloride infusion ( Intravenous New Bag/Given 06/12/18 1704)     Initial Impression / Assessment and Plan / ED Course  I have reviewed the triage vital signs and the nursing notes.  Pertinent labs & imaging results that were available during my care of the patient were reviewed by me and considered in my medical decision making (see chart for  details).     72 yo M with PMHx recently diagnosed pancreatic/intra-abdominal metastatic CA here with abdominal pain, distension. Labs show acute on chronic kidney injury. He's having intractable pain, vomiting. Will give fluids, analgesics. Consult Oncology re: further imaging/work-up, and admit.  Discussed with oncology, who recommends GI consult.  Discussed with lower GI, who will see the patient in the morning.  Patient may be a candidate for possible palliative stenting.  For now, will continue fluids, antiemetics, and pain control.  Final Clinical Impressions(s) / ED Diagnoses   Final diagnoses:  Metastatic cancer ([place])  Intractable vomiting with nausea, unspecified vomiting type    ED Discharge Orders    None       Duffy Bruce, MD 06/12/18 1715

## 2018-06-13 ENCOUNTER — Telehealth: Payer: Self-pay | Admitting: Interventional Radiology

## 2018-06-13 DIAGNOSIS — R112 Nausea with vomiting, unspecified: Secondary | ICD-10-CM

## 2018-06-13 DIAGNOSIS — F17290 Nicotine dependence, other tobacco product, uncomplicated: Secondary | ICD-10-CM

## 2018-06-13 DIAGNOSIS — C258 Malignant neoplasm of overlapping sites of pancreas: Secondary | ICD-10-CM

## 2018-06-13 DIAGNOSIS — R161 Splenomegaly, not elsewhere classified: Secondary | ICD-10-CM

## 2018-06-13 DIAGNOSIS — C799 Secondary malignant neoplasm of unspecified site: Secondary | ICD-10-CM

## 2018-06-13 DIAGNOSIS — I70201 Unspecified atherosclerosis of native arteries of extremities, right leg: Secondary | ICD-10-CM

## 2018-06-13 DIAGNOSIS — K5730 Diverticulosis of large intestine without perforation or abscess without bleeding: Secondary | ICD-10-CM

## 2018-06-13 DIAGNOSIS — K311 Adult hypertrophic pyloric stenosis: Secondary | ICD-10-CM

## 2018-06-13 DIAGNOSIS — R1011 Right upper quadrant pain: Secondary | ICD-10-CM

## 2018-06-13 DIAGNOSIS — R935 Abnormal findings on diagnostic imaging of other abdominal regions, including retroperitoneum: Secondary | ICD-10-CM

## 2018-06-13 LAB — COMPREHENSIVE METABOLIC PANEL
ALBUMIN: 2.4 g/dL — AB (ref 3.5–5.0)
ALK PHOS: 78 U/L (ref 38–126)
ALT: 15 U/L (ref 0–44)
ANION GAP: 7 (ref 5–15)
AST: 20 U/L (ref 15–41)
BILIRUBIN TOTAL: 0.6 mg/dL (ref 0.3–1.2)
BUN: 23 mg/dL (ref 8–23)
CALCIUM: 8.5 mg/dL — AB (ref 8.9–10.3)
CO2: 25 mmol/L (ref 22–32)
Chloride: 105 mmol/L (ref 98–111)
Creatinine, Ser: 1.11 mg/dL (ref 0.61–1.24)
GFR calc Af Amer: >60 mL/min (ref >60)
GFR calc non Af Amer: >60 mL/min (ref >60)
GLUCOSE: 109 mg/dL — AB (ref 70–99)
Potassium: 4.6 mmol/L (ref 3.5–5.1)
Sodium: 137 mmol/L (ref 135–145)
TOTAL PROTEIN: 6.8 g/dL (ref 6.5–8.1)

## 2018-06-13 LAB — CBC
HCT: 33.7 % — ABNORMAL LOW (ref 39.0–52.0)
Hemoglobin: 11.2 g/dL — ABNORMAL LOW (ref 13.0–17.0)
MCH: 32.6 pg (ref 26.0–34.0)
MCHC: 33.2 g/dL (ref 30.0–36.0)
MCV: 98 fL (ref 78.0–100.0)
PLATELETS: 388 10*3/uL (ref 150–400)
RBC: 3.44 MIL/uL — ABNORMAL LOW (ref 4.22–5.81)
RDW: 14.9 % (ref 11.5–15.5)
WBC: 10.9 10*3/uL — ABNORMAL HIGH (ref 4.0–10.5)

## 2018-06-13 LAB — HIV ANTIBODY (ROUTINE TESTING W REFLEX): HIV SCREEN 4TH GENERATION: NONREACTIVE

## 2018-06-13 MED ORDER — SODIUM CHLORIDE 0.9 % IV SOLN: INTRAVENOUS | Status: DC

## 2018-06-13 NOTE — H&P (View-Only) (Signed)
Referring Provider:  Dr. Ellender Hose, EDP Primary Care Physician:  Golden Circle, FNP Primary Gastroenterologist:  Althia Forts  Reason for Consultation:  Pancreatic cancer, ? GOO  HPI: Jeremy Mack is a 72 y.o. male with medical history significant for hypertension, GERD, segmental sigmoid resection for diverticulitis in 2004 who presented to the emergency department complaining of worsening abdominal pain and inability to take much PO for the past 24 to 48 hours.  Symptoms started several weeks ago with nausea and vomiting, associated with weight loss reported about 25 pound and intermittent abdominal pain.  patient has been drinking water and Boost, not able to eat food.  Patient saw his primary care provider who performed a CT scan which is concerning for metastatic pancreatic cancer (results below).  Now suddenly abdominal pain has worsened, he is not able to tolerate oral intake.  IMPRESSION: 1. Macrocystic neoplasm involving the proximal body of the pancreas with measurements given above. 2. Likely metastatic locoregional lymphadenopathy as detailed above. 3. Solid mass involving the LEFT adrenal gland, likely metastatic disease. 4. Solid mass involving the spleen, possibly representing metastatic disease. 5. Likely occlusion of the SUPERIOR mesenteric vein and the splenic vein due to the mass. The mass surrounds the celiac artery at its bifurcation and surrounds the LEFT gastric artery and the proper hepatic artery. 6. Mild intra and extrahepatic biliary ductal dilation. 7. Hepatic cirrhosis is suspected. Geographic steatosis involving the RIGHT lobe of the liver. No hepatic parenchymal masses. 8. Diffuse colonic diverticulosis without evidence of acute diverticulitis. 9. Moderate prostate gland enlargement with heterogeneous nodular enhancement. The largest enhancing nodule is in the median lobe. 10. BILATERAL common iliac artery aneurysms, 1.7 cm on the RIGHT and 1.9 cm on the  LEFT. 11. Occlusion of the RIGHT superficial femoral artery at its origin. 12.  Aortic Atherosclerosis (ICD10-170.0)  Patient has been continuing to tolerate liquids while inpatient with no vomiting.  Abdominal pain ok right now.   Past Medical History:  Diagnosis Date  . Cancer ([place])   . GERD (gastroesophageal reflux disease)   . Hyperlipemia   . Hypertension     Past Surgical History:  Procedure Laterality Date  . arm fracture surgery Right   . colon polyps removed      Prior to Admission medications   Medication Sig Start Date End Date Taking? Authorizing Provider  ciprofloxacin (CIPRO) 500 MG tablet TAKE 1 TABLET BY MOUTH EVERY 12 HOURS FOR 10 DAYS 06/08/18  Yes [provider]  metroNIDAZOLE (FLAGYL) 500 MG tablet TAKE 1 TABLET BY MOUTH THREE TIMES A DAY FOR 10 DAYS 06/08/18  Yes [provider]  naproxen sodium (ALEVE) 220 MG tablet Take 440 mg by mouth 2 (two) times daily as needed (pain).   Yes [provider]  omeprazole (PRILOSEC) 20 MG capsule Take 1 capsule (20 mg total) by mouth daily. Patient taking differently: Take 20 mg by mouth every other day.  02/07/16  Yes Golden Circle, FNP  pravastatin (PRAVACHOL) 80 MG tablet TAKE 1 TABLET DAILY 08/17/16  Yes Golden Circle, FNP  amLODipine (NORVASC) 10 MG tablet Take 1 tablet (10 mg total) by mouth daily. Patient not taking: Reported on 06/12/2018 09/09/16   Golden Circle, FNP  HYDROcodone-acetaminophen (NORCO/VICODIN) 5-325 MG tablet Take 1 tablet by mouth every 8 (eight) hours as needed for severe pain. Patient not taking: Reported on 06/12/2018 08/05/16   Frederica Kuster, PA-C  lisinopril-hydrochlorothiazide (PRINZIDE,ZESTORETIC) 20-12.5 MG tablet TAKE 1 TABLET EVERY DAY Patient not  taking: Reported on 06/12/2018 03/21/17   Golden Circle, FNP  naproxen (NAPROSYN) 500 MG tablet TAKE 1 TABLET TWICE DAILY AS NEEDED Patient not taking: Reported on 06/12/2018 08/17/16   Golden Circle, FNP   VASCEPA 1 g CAPS TAKE 2 CAPSULES TWICE DAILY Patient not taking: Reported on 06/12/2018 08/17/16   Golden Circle, FNP    Current Facility-Administered Medications  Medication Dose Route Frequency Provider Last Rate Last Dose  . 0.9 %  sodium chloride infusion   Intravenous Continuous Patrecia Pour, Christean Grief, MD 100 mL/hr at 06/13/18 0747    . acetaminophen (TYLENOL) tablet 650 mg  650 mg Oral Q6H PRN Patrecia Pour, Christean Grief, MD       Or  . acetaminophen (TYLENOL) suppository 650 mg  650 mg Rectal Q6H PRN Patrecia Pour, Christean Grief, MD      . enoxaparin (LOVENOX) injection 40 mg  40 mg Subcutaneous Q24H Patrecia Pour, Christean Grief, MD   40 mg at 06/12/18 2114  . morphine 2 MG/ML injection 2 mg  2 mg Intravenous Q3H PRN Patrecia Pour, Christean Grief, MD      . ondansetron [facility]) tablet 4 mg  4 mg Oral Q6H PRN Patrecia Pour, Christean Grief, MD       Or  . ondansetron [facility]) injection 4 mg  4 mg Intravenous Q6H PRN Doreatha Lew, MD        Allergies as of 06/12/2018  . (No Known Allergies)    Family History  Problem Relation Age of Onset  . Diabetes Mother   . Diabetes Maternal Grandmother     Social History   Socioeconomic History  . Marital status: Single    Spouse name: Not on file  . Number of children: 4  . Years of education: 7  . Highest education level: Not on file  Occupational History  . Occupation: Retired  Scientific laboratory technician  . Financial resource strain: Not on file  . Food insecurity:    Worry: Not on file    Inability: Not on file  . Transportation needs:    Medical: Not on file    Non-medical: Not on file  Tobacco Use  . Smoking status: Current Every Day Smoker    Packs/day: 0.50    Types: Cigars  . Smokeless tobacco: Never Used  Substance and Sexual Activity  . Alcohol use: Yes    Alcohol/week: 0.0 oz    Comment: Occasionally  . Drug use: No  . Sexual activity: Not on file  Lifestyle  . Physical activity:    Days per week: Not on file    Minutes per session: Not on file  .  Stress: Not on file  Relationships  . Social connections:    Talks on phone: Not on file    Gets together: Not on file    Attends religious service: Not on file    Active member of club or organization: Not on file    Attends meetings of clubs or organizations: Not on file    Relationship status: Not on file  . Intimate partner violence:    Fear of current or ex partner: Not on file    Emotionally abused: Not on file    Physically abused: Not on file    Forced sexual activity: Not on file  Other Topics Concern  . Not on file  Social History Narrative   Fun: Swimming, and outdoor sports   Denies religious beliefs effecting health care.     Review of Systems: ROS is O/W  negative except as mentioned in HPI.  Physical Exam: Vital signs in last 24 hours: Temp:  [97.6 F (36.4 C)-98.7 F (37.1 C)] 97.9 F (36.6 C) (07/17 0620) Pulse Rate:  [80-117] 80 (07/17 0620) Resp:  [2-20] 2 (07/17 0620) BP: (102-139)/(74-91) 139/91 (07/17 0620) SpO2:  [97 %-100 %] 100 % (07/17 0620) Weight:  [152 lb 8.9 oz (69.2 kg)-160 lb (72.6 kg)] 152 lb 8.9 oz (69.2 kg) (07/16 2142) Last BM Date: 06/06/18 General:   Alert, Well-developed, well-nourished, pleasant and cooperative in NAD Head:  Normocephalic and atraumatic. Eyes:  Sclera clear, no icterus.  Conjunctiva pink. Ears:  Normal auditory acuity. Mouth:  No deformity or lesions.   Lungs:  Clear throughout to auscultation.  No wheezes, crackles, or rhonchi.  Heart:  Regular rate and rhythm; no murmurs, clicks, rubs, or gallops. Abdomen:  Soft, non-distended.  BS present.  Non-tender.  Laparotomy scar noted from previous surgery.   Msk:  Symmetrical without gross deformities. Pulses:  Normal pulses noted. Extremities:  Without clubbing or edema. Neurologic:  Alert and oriented x 4;  grossly normal neurologically. Skin:  Intact without significant lesions or rashes. Psych:  Alert and cooperative. Normal mood and affect.  Intake/Output from  previous day: 07/16 0701 - 07/17 0700 In: 1376.7 [I.V.:376.7; IV Piggyback:1000] Out: 200 [Urine:200]  Lab Results: Recent Labs    06/12/18 1142 06/13/18 0356  WBC 14.6* 10.9*  HGB 13.4 11.2*  HCT 38.9* 33.7*  PLT 425* 388   BMET Recent Labs    06/12/18 1142 06/13/18 0356  NA 137 137  K 4.9 4.6  CL 100 105  CO2 25 25  GLUCOSE 147* 109*  BUN 26* 23  CREATININE 1.47* 1.11  CALCIUM 9.4 8.5*   LFT Recent Labs    06/13/18 0356  PROT 6.8  ALBUMIN 2.4*  AST 20  ALT 15  ALKPHOS 78  BILITOT 0.6   PT/INR Recent Labs    06/12/18 1552  LABPROT 15.7*  INR 1.26   Studies/Results: Dg Abdomen Acute W/chest  Result Date: 06/12/2018 CLINICAL DATA:  Abdominal pain and vomiting EXAM: DG ABDOMEN ACUTE W/ 1V CHEST COMPARISON:  CT abdomen and pelvis 06/06/2018 FINDINGS: Normal heart size, mediastinal contours, and pulmonary vascularity. Lungs clear. No pleural effusion or pneumothorax. Retained contrast in small bowel and colon from recent CT. No bowel dilatation, bowel wall thickening, or free air. Bones unremarkable. IMPRESSION: No acute abnormalities. Electronically Signed   By: Lavonia Dana M.D.   On: 06/12/2018 18:21   IMPRESSION:  *New diagnosis of metastatic pancreatic cancer.  Seen on recent imaging, no evaluation thus far.  Now presenting with nausea, vomiting, abdominal pain with concern for possible GOO from malignancy.  PLAN: *Planning for EGD/EUS for 7/18 with Dr. Ardis Hughs at 9:30 AM tentatively.  Will discontinue Lovenox for now since it is due this evening but will likely need to be resumed post procedure.   Laban Emperor. Zehr  06/13/2018, 9:44 AM

## 2018-06-13 NOTE — Consult Note (Signed)
Referring Provider:  Dr. Ellender Hose, EDP Primary Care Physician:  Golden Circle, FNP Primary Gastroenterologist:  Althia Forts  Reason for Consultation:  Pancreatic cancer, ? GOO  HPI: Jeremy Mack is a 72 y.o. male with medical history significant for hypertension, GERD, segmental sigmoid resection for diverticulitis in 2004 who presented to the emergency department complaining of worsening abdominal pain and inability to take much PO for the past 24 to 48 hours.  Symptoms started several weeks ago with nausea and vomiting, associated with weight loss reported about 25 pound and intermittent abdominal pain.  patient has been drinking water and Boost, not able to eat food.  Patient saw his primary care provider who performed a CT scan which is concerning for metastatic pancreatic cancer (results below).  Now suddenly abdominal pain has worsened, he is not able to tolerate oral intake.  IMPRESSION: 1. Macrocystic neoplasm involving the proximal body of the pancreas with measurements given above. 2. Likely metastatic locoregional lymphadenopathy as detailed above. 3. Solid mass involving the LEFT adrenal gland, likely metastatic disease. 4. Solid mass involving the spleen, possibly representing metastatic disease. 5. Likely occlusion of the SUPERIOR mesenteric vein and the splenic vein due to the mass. The mass surrounds the celiac artery at its bifurcation and surrounds the LEFT gastric artery and the proper hepatic artery. 6. Mild intra and extrahepatic biliary ductal dilation. 7. Hepatic cirrhosis is suspected. Geographic steatosis involving the RIGHT lobe of the liver. No hepatic parenchymal masses. 8. Diffuse colonic diverticulosis without evidence of acute diverticulitis. 9. Moderate prostate gland enlargement with heterogeneous nodular enhancement. The largest enhancing nodule is in the median lobe. 10. BILATERAL common iliac artery aneurysms, 1.7 cm on the RIGHT and 1.9 cm on the  LEFT. 11. Occlusion of the RIGHT superficial femoral artery at its origin. 12.  Aortic Atherosclerosis (ICD10-170.0)  Patient has been continuing to tolerate liquids while inpatient with no vomiting.  Abdominal pain ok right now.   Past Medical History:  Diagnosis Date  . Cancer ([place])   . GERD (gastroesophageal reflux disease)   . Hyperlipemia   . Hypertension     Past Surgical History:  Procedure Laterality Date  . arm fracture surgery Right   . colon polyps removed      Prior to Admission medications   Medication Sig Start Date End Date Taking? Authorizing Provider  ciprofloxacin (CIPRO) 500 MG tablet TAKE 1 TABLET BY MOUTH EVERY 12 HOURS FOR 10 DAYS 06/08/18  Yes [provider]  metroNIDAZOLE (FLAGYL) 500 MG tablet TAKE 1 TABLET BY MOUTH THREE TIMES A DAY FOR 10 DAYS 06/08/18  Yes [provider]  naproxen sodium (ALEVE) 220 MG tablet Take 440 mg by mouth 2 (two) times daily as needed (pain).   Yes [provider]  omeprazole (PRILOSEC) 20 MG capsule Take 1 capsule (20 mg total) by mouth daily. Patient taking differently: Take 20 mg by mouth every other day.  02/07/16  Yes Golden Circle, FNP  pravastatin (PRAVACHOL) 80 MG tablet TAKE 1 TABLET DAILY 08/17/16  Yes Golden Circle, FNP  amLODipine (NORVASC) 10 MG tablet Take 1 tablet (10 mg total) by mouth daily. Patient not taking: Reported on 06/12/2018 09/09/16   Golden Circle, FNP  HYDROcodone-acetaminophen (NORCO/VICODIN) 5-325 MG tablet Take 1 tablet by mouth every 8 (eight) hours as needed for severe pain. Patient not taking: Reported on 06/12/2018 08/05/16   Frederica Kuster, PA-C  lisinopril-hydrochlorothiazide (PRINZIDE,ZESTORETIC) 20-12.5 MG tablet TAKE 1 TABLET EVERY DAY Patient not  taking: Reported on 06/12/2018 03/21/17   Golden Circle, FNP  naproxen (NAPROSYN) 500 MG tablet TAKE 1 TABLET TWICE DAILY AS NEEDED Patient not taking: Reported on 06/12/2018 08/17/16   Golden Circle, FNP   VASCEPA 1 g CAPS TAKE 2 CAPSULES TWICE DAILY Patient not taking: Reported on 06/12/2018 08/17/16   Golden Circle, FNP    Current Facility-Administered Medications  Medication Dose Route Frequency Provider Last Rate Last Dose  . 0.9 %  sodium chloride infusion   Intravenous Continuous Patrecia Pour, Christean Grief, MD 100 mL/hr at 06/13/18 0747    . acetaminophen (TYLENOL) tablet 650 mg  650 mg Oral Q6H PRN Patrecia Pour, Christean Grief, MD       Or  . acetaminophen (TYLENOL) suppository 650 mg  650 mg Rectal Q6H PRN Patrecia Pour, Christean Grief, MD      . enoxaparin (LOVENOX) injection 40 mg  40 mg Subcutaneous Q24H Patrecia Pour, Christean Grief, MD   40 mg at 06/12/18 2114  . morphine 2 MG/ML injection 2 mg  2 mg Intravenous Q3H PRN Patrecia Pour, Christean Grief, MD      . ondansetron [facility]) tablet 4 mg  4 mg Oral Q6H PRN Patrecia Pour, Christean Grief, MD       Or  . ondansetron [facility]) injection 4 mg  4 mg Intravenous Q6H PRN Doreatha Lew, MD        Allergies as of 06/12/2018  . (No Known Allergies)    Family History  Problem Relation Age of Onset  . Diabetes Mother   . Diabetes Maternal Grandmother     Social History   Socioeconomic History  . Marital status: Single    Spouse name: Not on file  . Number of children: 4  . Years of education: 70  . Highest education level: Not on file  Occupational History  . Occupation: Retired  Scientific laboratory technician  . Financial resource strain: Not on file  . Food insecurity:    Worry: Not on file    Inability: Not on file  . Transportation needs:    Medical: Not on file    Non-medical: Not on file  Tobacco Use  . Smoking status: Current Every Day Smoker    Packs/day: 0.50    Types: Cigars  . Smokeless tobacco: Never Used  Substance and Sexual Activity  . Alcohol use: Yes    Alcohol/week: 0.0 oz    Comment: Occasionally  . Drug use: No  . Sexual activity: Not on file  Lifestyle  . Physical activity:    Days per week: Not on file    Minutes per session: Not on file  .  Stress: Not on file  Relationships  . Social connections:    Talks on phone: Not on file    Gets together: Not on file    Attends religious service: Not on file    Active member of club or organization: Not on file    Attends meetings of clubs or organizations: Not on file    Relationship status: Not on file  . Intimate partner violence:    Fear of current or ex partner: Not on file    Emotionally abused: Not on file    Physically abused: Not on file    Forced sexual activity: Not on file  Other Topics Concern  . Not on file  Social History Narrative   Fun: Swimming, and outdoor sports   Denies religious beliefs effecting health care.     Review of Systems: ROS is O/W  negative except as mentioned in HPI.  Physical Exam: Vital signs in last 24 hours: Temp:  [97.6 F (36.4 C)-98.7 F (37.1 C)] 97.9 F (36.6 C) (07/17 0620) Pulse Rate:  [80-117] 80 (07/17 0620) Resp:  [2-20] 2 (07/17 0620) BP: (102-139)/(74-91) 139/91 (07/17 0620) SpO2:  [97 %-100 %] 100 % (07/17 0620) Weight:  [152 lb 8.9 oz (69.2 kg)-160 lb (72.6 kg)] 152 lb 8.9 oz (69.2 kg) (07/16 2142) Last BM Date: 06/06/18 General:   Alert, Well-developed, well-nourished, pleasant and cooperative in NAD Head:  Normocephalic and atraumatic. Eyes:  Sclera clear, no icterus.  Conjunctiva pink. Ears:  Normal auditory acuity. Mouth:  No deformity or lesions.   Lungs:  Clear throughout to auscultation.  No wheezes, crackles, or rhonchi.  Heart:  Regular rate and rhythm; no murmurs, clicks, rubs, or gallops. Abdomen:  Soft, non-distended.  BS present.  Non-tender.  Laparotomy scar noted from previous surgery.   Msk:  Symmetrical without gross deformities. Pulses:  Normal pulses noted. Extremities:  Without clubbing or edema. Neurologic:  Alert and oriented x 4;  grossly normal neurologically. Skin:  Intact without significant lesions or rashes. Psych:  Alert and cooperative. Normal mood and affect.  Intake/Output from  previous day: 07/16 0701 - 07/17 0700 In: 1376.7 [I.V.:376.7; IV Piggyback:1000] Out: 200 [Urine:200]  Lab Results: Recent Labs    06/12/18 1142 06/13/18 0356  WBC 14.6* 10.9*  HGB 13.4 11.2*  HCT 38.9* 33.7*  PLT 425* 388   BMET Recent Labs    06/12/18 1142 06/13/18 0356  NA 137 137  K 4.9 4.6  CL 100 105  CO2 25 25  GLUCOSE 147* 109*  BUN 26* 23  CREATININE 1.47* 1.11  CALCIUM 9.4 8.5*   LFT Recent Labs    06/13/18 0356  PROT 6.8  ALBUMIN 2.4*  AST 20  ALT 15  ALKPHOS 78  BILITOT 0.6   PT/INR Recent Labs    06/12/18 1552  LABPROT 15.7*  INR 1.26   Studies/Results: Dg Abdomen Acute W/chest  Result Date: 06/12/2018 CLINICAL DATA:  Abdominal pain and vomiting EXAM: DG ABDOMEN ACUTE W/ 1V CHEST COMPARISON:  CT abdomen and pelvis 06/06/2018 FINDINGS: Normal heart size, mediastinal contours, and pulmonary vascularity. Lungs clear. No pleural effusion or pneumothorax. Retained contrast in small bowel and colon from recent CT. No bowel dilatation, bowel wall thickening, or free air. Bones unremarkable. IMPRESSION: No acute abnormalities. Electronically Signed   By: Lavonia Dana M.D.   On: 06/12/2018 18:21   IMPRESSION:  *New diagnosis of metastatic pancreatic cancer.  Seen on recent imaging, no evaluation thus far.  Now presenting with nausea, vomiting, abdominal pain with concern for possible GOO from malignancy.  PLAN: *Planning for EGD/EUS for 7/18 with Dr. Ardis Hughs at 9:30 AM tentatively.  Will discontinue Lovenox for now since it is due this evening but will likely need to be resumed post procedure.   Laban Emperor. Zehr  06/13/2018, 9:44 AM

## 2018-06-13 NOTE — Telephone Encounter (Signed)
Discussed imaging over phone with Dr. Dema Severin in contemplation of possible biopsy. Primary pancreatic lesion is approachable for core biopsy, possible transhepatic approach under CT. Left adrenal and splenic lesions are technically approachable but lower yield sites. Adenopathy is small and not good target for biopsy in this patient. Given apparent unresectability on CT, documentation of metastatic disease less critical.

## 2018-06-13 NOTE — Progress Notes (Signed)
HEMATOLOGY/ONCOLOGY CONSULTATION NOTE  Date of Service: 06/13/2018  Patient Care Team: Golden Circle, FNP as PCP - General (Family Medicine)  CHIEF COMPLAINTS/PURPOSE OF CONSULTATION:  Concern for metastatic pancreatic cancer   HISTORY OF PRESENTING ILLNESS:   Jeremy Mack is a wonderful 72 y.o. male who has been referred to [place] by Dr. Dessa Phi for evaluation and management of Concern for metastatic pancreatic cancer. He is accompanied today by his wife.  He has a h/o HTN, HLD, smoker, etoh use and presented to the ED on 06/12/18 for significant abdominal pain, nausea, and vomiting. The pain developed 3-4 weeks ago and worsened during the last day or two before the pt presented to the ED. Amidst his developing abdominal pain, the pt presented to his PCP and received a CT A/P on 06/06/18 as noted below. The pt notes that he began having worsening fatigue about 2 weeks ago. He notes that he started drinking soup, liquids, and Ensure alone three weeks ago, and hasn't had a bowel movement in as long. He notes that he has lost 30+ pounds since the first of the year. Since losing this weight he has stopped taking his BP medication.   The pt reports that his abdominal pain is currently well controlled and he is much more comfortable at this time.  He is awaiting an endoscopic biopsy tomorrow. He notes that he is currently experiencing a mild, burning sensation in his upper abdomen.  The pt notes that he has been very active in life, playing sports and tending to his garden but in the last month or so this has become more difficult due to fatigue.   Of note prior to the patient's visit today, pt has had CT A/P completed on 06/06/18 with results revealing 1. Macrocystic neoplasm involving the proximal body of the pancreas with measurements given above. 2. Likely metastatic locoregional lymphadenopathy as detailed above. 3. Solid mass involving the LEFT adrenal gland, likely metastatic  disease. 4. Solid mass involving the spleen, possibly representing metastatic disease. 5. Likely occlusion of the SUPERIOR mesenteric vein and the splenic vein due to the mass. The mass surrounds the celiac artery at its bifurcation and surrounds the LEFT gastric artery and the proper hepatic artery. 6. Mild intra and extrahepatic biliary ductal dilation. 7. Hepatic cirrhosis is suspected. Geographic steatosis involving the RIGHT lobe of the liver. No hepatic parenchymal masses. 8. Diffuse colonic diverticulosis without evidence of acute diverticulitis. 9. Moderate prostate gland enlargement with heterogeneous nodular enhancement. The largest enhancing nodule is in the median lobe. 10. BILATERAL common iliac artery aneurysms, 1.7 cm on the RIGHT and 1.9 cm on the LEFT. 11. Occlusion of the RIGHT superficial femoral artery at its origin. 12.  Aortic Atherosclerosis  Most recent lab results (06/13/18) of CBC  is as follows: all values are WNL except for WBC at 10.9k, RBC at 3.44, HGB at 11.2, HCT at 33.7.  On review of systems, pt reports unexpected weight loss, recent fatigue and weakness, abdominal pain, and denies moving his bowels, leg swelling, and any other symptoms.   On PMHx the pt reports diverticulitis, polyps and denies pancreatitis, stomach ulcers, liver problems.  On Social Hx the pt reports smoking 3-4 cigars each day, quitting upon his admission, and plans to continue in his cessation after leaving the hospital. He reports drinking half a gallon of vodka over 7-9 days. He used to work as a Chiropractor and denies any chemical/radiation exposure.  On Family  Hx the pt reports sister died from enlarged heart. Mom and sister died from DM, both with leg ampuations. He denies cancer and HTN.    MEDICAL HISTORY:  Past Medical History:  Diagnosis Date  . Cancer ([place])   . GERD (gastroesophageal reflux disease)   . Hyperlipemia   . Hypertension     SURGICAL  HISTORY: Past Surgical History:  Procedure Laterality Date  . arm fracture surgery Right   . colon polyps removed      SOCIAL HISTORY: Social History   Socioeconomic History  . Marital status: Single    Spouse name: Not on file  . Number of children: 4  . Years of education: 41  . Highest education level: Not on file  Occupational History  . Occupation: Retired  Scientific laboratory technician  . Financial resource strain: Not on file  . Food insecurity:    Worry: Not on file    Inability: Not on file  . Transportation needs:    Medical: Not on file    Non-medical: Not on file  Tobacco Use  . Smoking status: Current Every Day Smoker    Packs/day: 0.50    Types: Cigars  . Smokeless tobacco: Never Used  Substance and Sexual Activity  . Alcohol use: Yes    Alcohol/week: 0.0 oz    Comment: Occasionally  . Drug use: No  . Sexual activity: Not on file  Lifestyle  . Physical activity:    Days per week: Not on file    Minutes per session: Not on file  . Stress: Not on file  Relationships  . Social connections:    Talks on phone: Not on file    Gets together: Not on file    Attends religious service: Not on file    Active member of club or organization: Not on file    Attends meetings of clubs or organizations: Not on file    Relationship status: Not on file  . Intimate partner violence:    Fear of current or ex partner: Not on file    Emotionally abused: Not on file    Physically abused: Not on file    Forced sexual activity: Not on file  Other Topics Concern  . Not on file  Social History Narrative   Fun: Swimming, and outdoor sports   Denies religious beliefs effecting health care.     FAMILY HISTORY: Family History  Problem Relation Age of Onset  . Diabetes Mother   . Diabetes Maternal Grandmother     ALLERGIES:  has No Known Allergies.  MEDICATIONS:  Current Facility-Administered Medications  Medication Dose Route Frequency Provider Last Rate Last Dose  . 0.9 %   sodium chloride infusion   Intravenous Continuous Patrecia Pour, Christean Grief, MD 100 mL/hr at 06/13/18 0747    . acetaminophen (TYLENOL) tablet 650 mg  650 mg Oral Q6H PRN Patrecia Pour, Christean Grief, MD       Or  . acetaminophen (TYLENOL) suppository 650 mg  650 mg Rectal Q6H PRN Patrecia Pour, Christean Grief, MD      . morphine 2 MG/ML injection 2 mg  2 mg Intravenous Q3H PRN Patrecia Pour, Christean Grief, MD      . ondansetron [facility]) tablet 4 mg  4 mg Oral Q6H PRN Patrecia Pour, Christean Grief, MD       Or  . ondansetron [facility]) injection 4 mg  4 mg Intravenous Q6H PRN Doreatha Lew, MD        REVIEW OF SYSTEMS:  10 Point review of Systems was done is negative except as noted above.  PHYSICAL EXAMINATION: ECOG PERFORMANCE STATUS: 1-2  . Vitals:   06/12/18 2142 06/13/18 0620  BP: 118/79 (!) 139/91  Pulse: 81 80  Resp: 20 (!) 2  Temp: 98.7 F (37.1 C) 97.9 F (36.6 C)  SpO2: 99% 100%   Filed Weights   06/12/18 0949 06/12/18 2142  Weight: 160 lb (72.6 kg) 152 lb 8.9 oz (69.2 kg)   .Body mass index is 21.28 kg/m.  GENERAL:alert, in no acute distress and comfortable SKIN: no acute rashes, no significant lesions EYES: conjunctiva are pink and non-injected, sclera anicteric OROPHARYNX: MMM, no exudates, no oropharyngeal erythema or ulceration NECK: supple, no JVD LYMPH:  no palpable lymphadenopathy in the cervical, axillary or inguinal regions LUNGS: clear to auscultation b/l with normal respiratory effort HEART: regular rate & rhythm ABDOMEN:  normoactive bowel sounds , mild TTP epigastric area, not distended. Extremity: no pedal edema PSYCH: alert & oriented x 3 with fluent speech NEURO: no focal motor/sensory deficits  LABORATORY DATA:  I have reviewed the data as listed  . CBC Latest Ref Rng & Units 06/13/2018 06/12/2018 09/09/2016  WBC 4.0 - 10.5 K/uL 10.9(H) 14.6(H) 7.0  Hemoglobin 13.0 - 17.0 g/dL 11.2(L) 13.4 14.4  Hematocrit 39.0 - 52.0 % 33.7(L) 38.9(L) 41.5  Platelets 150 - 400 K/uL 388  425(H) 199.0    . CMP Latest Ref Rng & Units 06/13/2018 06/12/2018 09/09/2016  Glucose 70 - 99 mg/dL 109(H) 147(H) 113(H)  BUN 8 - 23 mg/dL 23 26(H) 12  Creatinine 0.61 - 1.24 mg/dL 1.11 1.47(H) 1.04  Sodium 135 - 145 mmol/L 137 137 145  Potassium 3.5 - 5.1 mmol/L 4.6 4.9 3.8  Chloride 98 - 111 mmol/L 105 100 107  CO2 22 - 32 mmol/L 25 25 28   Calcium 8.9 - 10.3 mg/dL 8.5(L) 9.4 9.4  Total Protein 6.5 - 8.1 g/dL 6.8 8.2(H) 7.3  Total Bilirubin 0.3 - 1.2 mg/dL 0.6 0.7 0.7  Alkaline Phos 38 - 126 U/L 78 94 120(H)  AST 15 - 41 U/L 20 22 17   ALT 0 - 44 U/L 15 18 18      RADIOGRAPHIC STUDIES: I have personally reviewed the radiological images as listed and agreed with the findings in the report. Ct Abdomen Pelvis W Contrast  Result Date: 06/06/2018 CLINICAL DATA:  Two-month history of generalized abdominal pain, anorexia and urinary frequency. Current history of hypertension and hyperlipidemia. EXAM: CT ABDOMEN AND PELVIS WITH CONTRAST TECHNIQUE: Multidetector CT imaging of the abdomen and pelvis was performed using the standard protocol following bolus administration of intravenous contrast. CONTRAST:  155mL ISOVUE-300 IOPAMIDOL INJECTION 61% IV. Oral contrast was also administered. COMPARISON:  None. FINDINGS: Lower chest: Mild bronchiectasis involving the lower lobes bilaterally. Visualized lung bases clear apart from minimal scarring in the RIGHT MIDDLE LOBE. Heart size upper normal. Hepatobiliary: Geographic low attenuation involving the entire RIGHT lobe with normal enhancement of the LEFT lobe. Anatomic variant in that the LEFT lobe extends well across the midline into the LEFT UPPER QUADRANT. Relative enlargement of the LEFT lobe and caudate lobe with mildly irregular hepatic contour. No hepatic parenchymal masses. Gallstones within the gallbladder. No evidence of cholecystitis. Mild dilation of the extrahepatic bile duct and the central intrahepatic ducts, maximum common bile duct diameter  approximately 11 mm. Pancreas: Large macrocystic mass involving the proximal body of the pancreas measuring approximately 4.9 x 7.3 x 8.8 cm (series 2, image 26 and coronal image 29).  Mild edema in the peripancreatic fat. Spleen: Mass involving the LATERAL spleen just below the level of the hilum measuring approximately 2.0 x 1.5 cm (series 2, image 25). Spleen normal in size. Adrenals/Urinary Tract: Enhancing mass involving the LEFT adrenal gland measuring approximately 2.5 x 2.5 cm (series 2, image 24). Cortical cysts involving both kidneys, the largest measuring approximately 2.5 cm arising from the mid RIGHT kidney. No solid renal masses. No hydronephrosis. No urinary tract calculi. Urinary bladder decompressed and unremarkable. Stomach/Bowel: Stomach normal in appearance, though there is extrinsic mass effect on the distal gastric body and antrum due to the pancreatic mass. Normal-appearing small bowel. Scattered diverticula involving the ascending colon and more substantial diverticulosis involving the descending and sigmoid colon without evidence of acute diverticulitis. Appendix not clearly visible, but no evidence of periappendiceal inflammation. Vascular/Lymphatic: Severe aorto-iliofemoral atherosclerosis. BILATERAL common iliac artery aneurysms measuring 1.7 cm on the RIGHT and 1.9 cm on the LEFT. Occlusion of the RIGHT superficial femoral artery at its origin. The mass surrounds the celiac artery at its bifurcation and also surrounds the proximal LEFT gastric artery and the proper hepatic artery. Narrowing of the LEFT renal vein as it courses between the aorta and the SUPERIOR mesenteric artery. The SUPERIOR mesenteric vein and the splenic vein are likely occluded by the pancreatic mass. The portal vein is patent. A large collateral vein is present in the LEFT UPPER QUADRANT emanating from the spleen and extending across the midline into the RIGHT UPPER QUADRANT to ultimately empty into the portal vein.  Mildly enlarged lymph nodes in the gastrohepatic ligament, the porta hepatis, the portacaval region, the retrocaval region of the retroperitoneum and the LEFT periaortic region of the retroperitoneum, indicating locoregional lymphadenopathy related to the pancreatic mass. No pathologic lymphadenopathy elsewhere. Reproductive: Moderately enlarged prostate gland with heterogeneous nodular enhancement, the most prominent enhancing nodule in the median lobe. Normal-appearing seminal vesicles. Other: Midline abdominal surgical scar. No evidence of incisional hernia. Musculoskeletal: DISH involving the LOWER thoracic and UPPER lumbar spine. Facet degenerative changes at L3-4 and L4-5. Moderate multifactorial spinal stenosis at L2-3, severe multifactorial spinal stenosis at L3-4 and L4-5. Ankylosis of the sacroiliac joints. Degenerative changes involving both hip joints. No acute findings. IMPRESSION: 1. Macrocystic neoplasm involving the proximal body of the pancreas with measurements given above. 2. Likely metastatic locoregional lymphadenopathy as detailed above. 3. Solid mass involving the LEFT adrenal gland, likely metastatic disease. 4. Solid mass involving the spleen, possibly representing metastatic disease. 5. Likely occlusion of the SUPERIOR mesenteric vein and the splenic vein due to the mass. The mass surrounds the celiac artery at its bifurcation and surrounds the LEFT gastric artery and the proper hepatic artery. 6. Mild intra and extrahepatic biliary ductal dilation. 7. Hepatic cirrhosis is suspected. Geographic steatosis involving the RIGHT lobe of the liver. No hepatic parenchymal masses. 8. Diffuse colonic diverticulosis without evidence of acute diverticulitis. 9. Moderate prostate gland enlargement with heterogeneous nodular enhancement. The largest enhancing nodule is in the median lobe. 10. BILATERAL common iliac artery aneurysms, 1.7 cm on the RIGHT and 1.9 cm on the LEFT. 11. Occlusion of the RIGHT  superficial femoral artery at its origin. 12.  Aortic Atherosclerosis (ICD10-170.0) Electronically Signed   By: Evangeline Dakin M.D.   On: 06/06/2018 17:09   Dg Abdomen Acute W/chest  Result Date: 06/12/2018 CLINICAL DATA:  Abdominal pain and vomiting EXAM: DG ABDOMEN ACUTE W/ 1V CHEST COMPARISON:  CT abdomen and pelvis 06/06/2018 FINDINGS: Normal heart size, mediastinal contours, and pulmonary  vascularity. Lungs clear. No pleural effusion or pneumothorax. Retained contrast in small bowel and colon from recent CT. No bowel dilatation, bowel wall thickening, or free air. Bones unremarkable. IMPRESSION: No acute abnormalities. Electronically Signed   By: Lavonia Dana M.D.   On: 06/12/2018 18:21    ASSESSMENT & PLAN:  72 y.o. male with h/o smoking and heavy ETOH use presenting abdominal pain and concering CT abd findings  1. Concern for Likely metastatic pancreatic cancer -Concern for mets to left adrenal gland, spleen , locoregional LN -Likely occlusion of the SUPERIOR mesenteric vein and the splenic vein due to the mass. The mass surrounds the celiac artery at its bifurcation and surrounds the LEFT gastric artery and the proper hepatic artery. -Intra and extrahepatic biliary obstruction but LFTs not significantly abnormal.  2. Possible liver cirrhosis - based on CT and h/o heavy ETOH use PLAN -Discussed patient's most recent labs from 06/13/18; WBC at 10.9k, HGB at 11.2.  Liver functions are normal.  -Discussed the 06/06/18 CT A/P including the concerning finding of metastatic disease, potential complications from local mass effect and encroachment on blood vessels and possible early biliary obstruction -will need to monitor LFts and might need biliary stent if CBD obstruction progresses and patient symptomatic or LFTs become abnormal -Awaiting endoscopic biopsy tomorrow morning 06/14/18  -Discussed with the pt and his wife that surgery would not be viable treatment option if this is confirmed to be  a pancreatic adenocarcinoma. Other possibilities exist including carcinoid tumor of pancreas -Will need a CT Chest to complete and potential outpatient PET/CT   -nutritional consultation. -considering potential vascular compromise would consider starting patient on ASA once cleared by GI post EUS/Biopsy -DVT prophylaxis per hospitalist -- high risk of VTE   All of the patients questions were answered with apparent satisfaction. The patient knows to call the clinic with any problems, questions or concerns.  The total time spent in the appt was 80 minutes and more than 50% was on counseling and direct patient cares.    Sullivan Lone MD MS AAHIVMS [facility] Hematology/Oncology Physician [facility]  (Office):       [phone] (Work cell):  [phone] (Fax):           [phone]  06/13/2018 1:01 PM  I, Baldwin [place], am acting as a Education administrator for Dr Irene Limbo.   .I have reviewed the above documentation for accuracy and completeness, and I agree with the above. Sullivan Lone MD MS

## 2018-06-13 NOTE — Progress Notes (Signed)
PROGRESS NOTE    Jeremy Mack  JSE:[id number] DOB: 09-06-1946 DOA: 06/12/2018 PCP: Golden Circle, FNP     Brief Narrative:  Jeremy Mack is a 72 y.o. male with medical history significant of hypertension and GERD who presented to the emergency department complaining of worsening abdominal pain for the past 24 to 48 hours.  Symptoms started several weeks ago with nausea and vomiting, associated with weight loss reported about 35 pound and intermittent abdominal pain.  Patient saw his primary care provider who performed a CT scan which is concerning for metastatic pancreatic cancer.  Now suddenly abdominal pain has worsened, he is not able to tolerate oral intake.  No bowel movement since last week. Oncology was consulted in the ED who recommended GI consult for possible palliative stenting.   New events last 24 hours / Subjective: No new issues.  On exam, he denies any abdominal pain, nausea or vomiting.  Assessment & Plan:   Active Problems:   Essential hypertension   Abdominal pain   Leukocytosis   Pancreatic cancer (HCC)   AKI (acute kidney injury) ([place])   Abdominal pain, nausea and vomiting likely secondary to metastatic pancreatic cancer, concern for possible gastric outlet obstruction -GI consulted, planning for EGD/EUS 7/18   New diagnosis metastatic pancreatic cancer -CT abdomen pelvis completed on 7/10 revealed microcytic neoplasm involving the proximal body of the pancreas, solid mass involving left adrenal gland likely metastatic disease, solid mass involving the spleen, likely occlusion of superior mesenteric vein and splenic vein -Oncology consulted  Acute kidney injury -IV fluid, resolved     DVT prophylaxis: Hold lovenox pending EGD/EUS. SCD ordered  Code Status: Partial code, DNI  Family Communication: No family at bedside Disposition Plan: Pending further evaluation, specialist input   Consultants:   GI  Oncology   Procedures:   None    Antimicrobials:  Anti-infectives (From admission, onward)   None       Objective: Vitals:   06/12/18 1830 06/12/18 1900 06/12/18 2142 06/13/18 0620  BP: 126/86 123/85 118/79 (!) 139/91  Pulse: 94 92 81 80  Resp: 20  20 (!) 2  Temp:   98.7 F (37.1 C) 97.9 F (36.6 C)  TempSrc:   Oral Oral  SpO2: 100% 98% 99% 100%  Weight:   69.2 kg (152 lb 8.9 oz)   Height:   5\' 11"  (1.803 m)     Intake/Output Summary (Last 24 hours) at 06/13/2018 1239 Last data filed at 06/13/2018 1100 Gross per 24 hour  Intake 1376.67 ml  Output 350 ml  Net 1026.67 ml   Filed Weights   06/12/18 0949 06/12/18 2142  Weight: 72.6 kg (160 lb) 69.2 kg (152 lb 8.9 oz)    Examination:  General exam: Appears calm and comfortable  Respiratory system: Clear to auscultation. Respiratory effort normal. Cardiovascular system: S1 & S2 heard, RRR. No JVD, murmurs, rubs, gallops or clicks. No pedal edema. Gastrointestinal system: Abdomen is nondistended, soft and nontender. No organomegaly or masses felt. Normal bowel sounds heard. Central nervous system: Alert and oriented. No focal neurological deficits. Extremities: Symmetric 5 x 5 power. Skin: No rashes, lesions or ulcers Psychiatry: Judgement and insight appear normal. Mood & affect flat.   Data Reviewed: I have personally reviewed following labs and imaging studies  CBC: Recent Labs  Lab 06/12/18 1142 06/13/18 0356  WBC 14.6* 10.9*  HGB 13.4 11.2*  HCT 38.9* 33.7*  MCV 97.0 98.0  PLT 425* 073   Basic Metabolic Panel: Recent Labs  Lab 06/12/18 1142 06/13/18 0356  NA 137 137  K 4.9 4.6  CL 100 105  CO2 25 25  GLUCOSE 147* 109*  BUN 26* 23  CREATININE 1.47* 1.11  CALCIUM 9.4 8.5*   GFR: Estimated Creatinine Clearance: 60.6 mL/min (by C-G formula based on SCr of 1.11 mg/dL). Liver Function Tests: Recent Labs  Lab 06/12/18 1142 06/13/18 0356  AST 22 20  ALT 18 15  ALKPHOS 94 78  BILITOT 0.7 0.6  PROT 8.2* 6.8  ALBUMIN 3.1* 2.4*    Recent Labs  Lab 06/12/18 1142  LIPASE 22   No results for input(s): AMMONIA in the last 168 hours. Coagulation Profile: Recent Labs  Lab 06/12/18 1552  INR 1.26   Cardiac Enzymes: No results for input(s): CKTOTAL, CKMB, CKMBINDEX, TROPONINI in the last 168 hours. BNP (last 3 results) No results for input(s): PROBNP in the last 8760 hours. HbA1C: No results for input(s): HGBA1C in the last 72 hours. CBG: No results for input(s): GLUCAP in the last 168 hours. Lipid Profile: No results for input(s): CHOL, HDL, LDLCALC, TRIG, CHOLHDL, LDLDIRECT in the last 72 hours. Thyroid Function Tests: No results for input(s): TSH, T4TOTAL, FREET4, T3FREE, THYROIDAB in the last 72 hours. Anemia Panel: No results for input(s): VITAMINB12, FOLATE, FERRITIN, TIBC, IRON, RETICCTPCT in the last 72 hours. Sepsis Labs: No results for input(s): PROCALCITON, LATICACIDVEN in the last 168 hours.  No results found for this or any previous visit (from the past 240 hour(s)).     Radiology Studies: Dg Abdomen Acute W/chest  Result Date: 06/12/2018 CLINICAL DATA:  Abdominal pain and vomiting EXAM: DG ABDOMEN ACUTE W/ 1V CHEST COMPARISON:  CT abdomen and pelvis 06/06/2018 FINDINGS: Normal heart size, mediastinal contours, and pulmonary vascularity. Lungs clear. No pleural effusion or pneumothorax. Retained contrast in small bowel and colon from recent CT. No bowel dilatation, bowel wall thickening, or free air. Bones unremarkable. IMPRESSION: No acute abnormalities. Electronically Signed   By: Lavonia Dana M.D.   On: 06/12/2018 18:21      Scheduled Meds: Continuous Infusions: . sodium chloride 100 mL/hr at 06/13/18 0747     LOS: 1 day    Time spent: 35 minutes   Dessa Phi, DO [facility] [email or web address] Password [facility] 06/13/2018, 12:39 PM

## 2018-06-14 ENCOUNTER — Encounter (HOSPITAL_COMMUNITY)
Admission: EM | Disposition: A | Discharge status: Home / Self Care | Payer: Self-pay | Source: Home / Self Care | Attending: Internal Medicine

## 2018-06-14 ENCOUNTER — Encounter (HOSPITAL_COMMUNITY): Payer: Self-pay | Admitting: *Deleted

## 2018-06-14 ENCOUNTER — Inpatient Hospital Stay (HOSPITAL_COMMUNITY): Payer: Medicare HMO | Admitting: Anesthesiology

## 2018-06-14 DIAGNOSIS — K859 Acute pancreatitis without necrosis or infection, unspecified: Secondary | ICD-10-CM

## 2018-06-14 DIAGNOSIS — R933 Abnormal findings on diagnostic imaging of other parts of digestive tract: Secondary | ICD-10-CM

## 2018-06-14 DIAGNOSIS — K8689 Other specified diseases of pancreas: Secondary | ICD-10-CM

## 2018-06-14 DIAGNOSIS — F17210 Nicotine dependence, cigarettes, uncomplicated: Secondary | ICD-10-CM

## 2018-06-14 DIAGNOSIS — R634 Abnormal weight loss: Secondary | ICD-10-CM

## 2018-06-14 DIAGNOSIS — K219 Gastro-esophageal reflux disease without esophagitis: Secondary | ICD-10-CM

## 2018-06-14 DIAGNOSIS — E278 Other specified disorders of adrenal gland: Secondary | ICD-10-CM

## 2018-06-14 DIAGNOSIS — E440 Moderate protein-calorie malnutrition: Secondary | ICD-10-CM

## 2018-06-14 HISTORY — PX: ESOPHAGOGASTRODUODENOSCOPY (EGD) WITH PROPOFOL: SHX5813

## 2018-06-14 HISTORY — PX: FINE NEEDLE ASPIRATION: SHX5430

## 2018-06-14 HISTORY — PX: EUS: SHX5427

## 2018-06-14 LAB — COMPREHENSIVE METABOLIC PANEL
ALT: 21 U/L (ref 0–44)
AST: 38 U/L (ref 15–41)
Albumin: 2.5 g/dL — ABNORMAL LOW (ref 3.5–5.0)
Alkaline Phosphatase: 77 U/L (ref 38–126)
Anion gap: 6 (ref 5–15)
BUN: 12 mg/dL (ref 8–23)
CHLORIDE: 107 mmol/L (ref 98–111)
CO2: 25 mmol/L (ref 22–32)
CREATININE: 0.96 mg/dL (ref 0.61–1.24)
Calcium: 8.7 mg/dL — ABNORMAL LOW (ref 8.9–10.3)
Glucose, Bld: 94 mg/dL (ref 70–99)
POTASSIUM: 4.6 mmol/L (ref 3.5–5.1)
Sodium: 138 mmol/L (ref 135–145)
TOTAL PROTEIN: 6.7 g/dL (ref 6.5–8.1)
Total Bilirubin: 0.3 mg/dL (ref 0.3–1.2)

## 2018-06-14 LAB — CBC
HEMATOCRIT: 34.7 % — AB (ref 39.0–52.0)
Hemoglobin: 11.5 g/dL — ABNORMAL LOW (ref 13.0–17.0)
MCH: 32.6 pg (ref 26.0–34.0)
MCHC: 33.1 g/dL (ref 30.0–36.0)
MCV: 98.3 fL (ref 78.0–100.0)
PLATELETS: 369 10*3/uL (ref 150–400)
RBC: 3.53 MIL/uL — AB (ref 4.22–5.81)
RDW: 14.9 % (ref 11.5–15.5)
WBC: 6.2 10*3/uL (ref 4.0–10.5)

## 2018-06-14 SURGERY — ESOPHAGOGASTRODUODENOSCOPY (EGD) WITH PROPOFOL
Anesthesia: Monitor Anesthesia Care

## 2018-06-14 MED ORDER — PROPOFOL 500 MG/50ML IV EMUL
INTRAVENOUS | Status: DC | PRN
  Administered 2018-06-14: 125 ug/kg/min via INTRAVENOUS

## 2018-06-14 MED ORDER — SODIUM CHLORIDE 0.9 % IV SOLN
1.0000 g | INTRAVENOUS | Status: DC
  Administered 2018-06-15 – 2018-06-18 (×4): 1000 mg via INTRAVENOUS
  Filled 2018-06-14 (×6): qty 1

## 2018-06-14 MED ORDER — CIPROFLOXACIN IN D5W 400 MG/200ML IV SOLN
INTRAVENOUS | Status: AC
  Filled 2018-06-14: qty 200

## 2018-06-14 MED ORDER — CIPROFLOXACIN IN D5W 400 MG/200ML IV SOLN
INTRAVENOUS | Status: DC | PRN
  Administered 2018-06-14: 400 mg via INTRAVENOUS

## 2018-06-14 MED ORDER — ENSURE ENLIVE PO LIQD
237.0000 mL | Freq: Two times a day (BID) | ORAL | Status: DC
  Administered 2018-06-15 – 2018-06-19 (×5): 237 mL via ORAL

## 2018-06-14 MED ORDER — LIDOCAINE 2% (20 MG/ML) 5 ML SYRINGE
INTRAMUSCULAR | Status: DC | PRN
  Administered 2018-06-14: 100 mg via INTRAVENOUS

## 2018-06-14 MED ORDER — PROPOFOL 500 MG/50ML IV EMUL
INTRAVENOUS | Status: DC | PRN
  Administered 2018-06-14 (×2): 50 mg via INTRAVENOUS

## 2018-06-14 MED ORDER — LACTATED RINGERS IV SOLN
INTRAVENOUS | Status: DC | PRN
  Administered 2018-06-14: 09:00:00 via INTRAVENOUS

## 2018-06-14 SURGICAL SUPPLY — 15 items

## 2018-06-14 NOTE — Progress Notes (Signed)
PROGRESS NOTE    Jeremy Mack  QJF:[id number] DOB: 05/09/1946 DOA: 06/12/2018 PCP: [facility], FNP     Brief Narrative:  Jeremy Mack is a 72 y.o. male with medical history significant of hypertension and GERD who presented to the emergency department complaining of worsening abdominal pain for the past 24 to 48 hours.  Symptoms started several weeks ago with nausea and vomiting, associated with weight loss reported about 35 pound and intermittent abdominal pain.  Patient saw his primary care provider who performed a CT scan which is concerning for metastatic pancreatic cancer.  Now suddenly abdominal pain has worsened, he is not able to tolerate oral intake.  No bowel movement since last week. Oncology was consulted in the ED who recommended GI consulted.   New events last 24 hours / Subjective: He underwent EGD/EUS this morning.  Patient is seen and evaluated postprocedure.  He had just finished lunch, denies any nausea, vomiting, abdominal pain.  Assessment & Plan:   Active Problems:   Essential hypertension   Abdominal pain   Leukocytosis   Pancreatic cancer ([place])   AKI (acute kidney injury) ([place])   Metastatic cancer ([facility])   Abnormal CT of the abdomen   Gastric outlet obstruction   Pancreatic mass   Malnutrition of moderate degree   Abdominal pain, nausea and vomiting likely secondary to metastatic pancreatic cancer, concern for possible gastric outlet obstruction -GI consulted -S/p EGD/EUS, found involving the pancreas.  6 cm abscess with very abundant neutrophils, 4.5 cm mixed solid/cystic lesion.  Also biopsy of left adrenal.  No gastric outlet obstruction seen -Spoke with GI PA postprocedure.  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 Irene Limbo   Acute kidney injury -Resolved     DVT prophylaxis: SCD  Code Status: Partial code, DNI  Family Communication: No family at bedside Disposition Plan: Pending biopsy result, specialty recommendations    Consultants:   GI  Oncology   Procedures:   EGD/EUS 7/1 Impression:       - Two lesions involving the pancreas. The largest                            directly abuts the stomach in the neck/body of the                            pancreas, measures at least 6cm on this exam, is                            mixed cystic/solid. Preliminary FNA results shows                            very abundant neutophils (abscess). The smaller                            lesion is about 4.5cm across, appears mixed  solid/cystic as well, involves the body/tail of the                            pancreas and was not adequately visualized on this                            exam but is clear on recent CT (however not                            described in CT report).                           - Mass involving the left adrenal was evident and                            it was sampled with a single transgastric FNA.                            Preliminary cytology review suggests atypical                            cells, await final cytology (is this truly a                            metastatic site or is it an incidental, unrelated                            finding such as an adrenal adenoma).                           - He does not have gastric outlet obstruction.   Antimicrobials:  Anti-infectives (From admission, onward)   None       Objective: Vitals:   06/14/18 0613 06/14/18 0845 06/14/18 1027 06/14/18 1037  BP: (!) 134/91 140/83 104/66 140/90  Pulse: 70 68 73 68  Resp: 16 12 17 18   Temp: 98.6 F (37 C)  98.1 F (36.7 C) 97.8 F (36.6 C)   TempSrc: Oral Oral Oral   SpO2: 100% 99% 100% 100%  Weight:  69.2 kg (152 lb 8.9 oz)    Height:  5\' 11"  (1.803 m)      Intake/Output Summary (Last 24 hours) at 06/14/2018 1316 Last data filed at 06/14/2018 1028 Gross per 24 hour  Intake 3660 ml  Output 950 ml  Net 2710 ml   Filed Weights   06/12/18 0949 06/12/18 2142 06/14/18 0845  Weight: 72.6 kg (160 lb) 69.2 kg (152 lb 8.9 oz) 69.2 kg (152 lb 8.9 oz)    Examination: General exam: Appears calm and comfortable  Respiratory system: Clear to auscultation. Respiratory effort normal. Cardiovascular system: S1 & S2 heard, RRR. No JVD, murmurs, rubs, gallops or clicks. No pedal edema. Gastrointestinal system: Abdomen is nondistended, soft and nontender. No organomegaly or masses felt. Normal bowel sounds heard. Central nervous system: Alert and oriented. No focal neurological deficits. Extremities: Symmetric 5 x 5 power. Skin: No rashes, lesions or ulcers Psychiatry: Judgement and insight appear normal. Mood & affect appropriate.    Data Reviewed: I have  personally reviewed following labs and imaging studies  CBC: Recent Labs  Lab 06/12/18 1142 06/13/18 0356 06/14/18 0431  WBC 14.6* 10.9* 6.2  HGB 13.4 11.2* 11.5*  HCT 38.9* 33.7* 34.7*  MCV 97.0 98.0 98.3  PLT 425* 388 782   Basic Metabolic Panel: Recent Labs  Lab 06/12/18 1142 06/13/18 0356 06/14/18 0431  NA 137 137 138  K 4.9 4.6 4.6  CL 100 105 107  CO2 25 25 25   GLUCOSE 147* 109* 94  BUN 26* 23 12  CREATININE 1.47* 1.11 0.96  CALCIUM 9.4 8.5* 8.7*   GFR: Estimated Creatinine Clearance: 70.1 mL/min (by C-G formula based on SCr of 0.96 mg/dL). Liver Function Tests: Recent Labs  Lab 06/12/18 1142 06/13/18 0356 06/14/18 0431  AST 22 20 38  ALT 18 15 21   ALKPHOS 94 78 77  BILITOT 0.7 0.6 0.3  PROT 8.2* 6.8 6.7  ALBUMIN 3.1* 2.4* 2.5*   Recent Labs  Lab 06/12/18 1142  LIPASE 22   No results for input(s):  AMMONIA in the last 168 hours. Coagulation Profile: Recent Labs  Lab 06/12/18 1552  INR 1.26   Cardiac Enzymes: No results for input(s): CKTOTAL, CKMB, CKMBINDEX, TROPONINI in the last 168 hours. BNP (last 3 results) No results for input(s): PROBNP in the last 8760 hours. HbA1C: No results for input(s): HGBA1C in the last 72 hours. CBG: No results for input(s): GLUCAP in the last 168 hours. Lipid Profile: No results for input(s): CHOL, HDL, LDLCALC, TRIG, CHOLHDL, LDLDIRECT in the last 72 hours. Thyroid Function Tests: No results for input(s): TSH, T4TOTAL, FREET4, T3FREE, THYROIDAB in the last 72 hours. Anemia Panel: No results for input(s): VITAMINB12, FOLATE, FERRITIN, TIBC, IRON, RETICCTPCT in the last 72 hours. Sepsis Labs: No results for input(s): PROCALCITON, LATICACIDVEN in the last 168 hours.  No results found for this or any previous visit (from the past 240 hour(s)).     Radiology Studies: Dg Abdomen Acute W/chest  Result Date: 06/12/2018 CLINICAL DATA:  Abdominal pain and vomiting EXAM: DG ABDOMEN ACUTE W/ 1V CHEST COMPARISON:  CT abdomen and pelvis 06/06/2018 FINDINGS: Normal heart size, mediastinal contours, and pulmonary vascularity. Lungs clear. No pleural effusion or pneumothorax. Retained contrast in small bowel and colon from recent CT. No bowel dilatation, bowel wall thickening, or free air. Bones unremarkable. IMPRESSION: No acute abnormalities. Electronically Signed   By: Lavonia Dana M.D.   On: 06/12/2018 18:21      Scheduled Meds: . feeding supplement (ENSURE ENLIVE)  237 mL Oral BID BM   Continuous Infusions:    LOS: 2 days    Time spent: 20 minutes   Dessa Phi, DO [facility] [email or web address] Password [email or web address] 06/14/2018, 1:16 PM

## 2018-06-14 NOTE — Op Note (Addendum)
[facility] Patient Name: Graden Hoshino Procedure Date: 06/14/2018 MRN: [id number] Attending MD: [name] , MD Date of Birth: May 04, 1946 CSN: [id number] Age: 72 Admit Type: Inpatient Procedure:                Upper EUS Indications:              abdominal pain, weight loss; CT scan suggesting                            mass in pancreas with abdominal spread Providers:                [name], MD, Zenon Mayo, RN, William Dalton, Technician Referring MD:              Medicines:                Monitored Anesthesia Care, cipro 400mg  IV Complications:            No immediate complications. Estimated blood loss:                            None. Estimated Blood Loss:     Estimated blood loss: none. Procedure:                Pre-Anesthesia Assessment:                           - Prior to the procedure, a History and Physical                            was performed, and patient medications and                            allergies were reviewed. The patient's tolerance of                            previous anesthesia was also reviewed. The risks                            and benefits of the procedure and the sedation                            options and risks were discussed with the patient.                            All questions were answered, and informed consent                            was obtained. Prior Anticoagulants: The patient has                            taken no previous anticoagulant or antiplatelet  agents. ASA Grade Assessment: II - A patient with                            mild systemic disease. After reviewing the risks                            and benefits, the patient was deemed in                            satisfactory condition to undergo the procedure.                           After obtaining informed consent, the endoscope was                            passed under direct  vision. Throughout the                            procedure, the patient's blood pressure, pulse, and                            oxygen saturations were monitored continuously. The                            [id number] ([id number]) scope was introduced through                            the mouth, and advanced to the second part of                            duodenum. The upper EUS was accomplished without                            difficulty. The patient tolerated the procedure                            well. Findings:      ENDOSCOPIC FINDING: :      The examined esophagus was endoscopically normal.      The entire examined stomach was endoscopically normal.      The examined duodenum was endoscopically normal.      No gastric outlet obstruction.      ENDOSONOGRAPHIC FINDING: :      1. In the neck, body of the pancreas there was an amorphous, mixed       cystic/solid lesion directly abutting the outer wall of the distal       stomach. This measured at least 6cm across and it was sampled with 2       transgastric passes with a 25 gauge EUS FNA needle.      2. There was a solid 2.2cm hypoechoic, heterogeneous mass involving the       left adrenal. This was sampled with a single transgastric pass with a       separate 25 gauge FNA needle.      3. I was unable to adequately visualize the 4cm mixed, cystic/solid  lesion located in the body/tail of the pancreas that was evident (but       not described) on recent CT.      4. Scattered small peripancreatic LNs were noted.      5. Limited views of the liver, spleen were normal. Impression:               - Two lesions involving the pancreas. The largest                            directly abuts the stomach in the neck/body of the                            pancreas, measures at least 6cm on this exam, is                            mixed cystic/solid. Preliminary FNA results shows                            very abundant neutophils  (abscess). The smaller                            lesion is about 4.5cm across, appears mixed                            solid/cystic as well, involves the body/tail of the                            pancreas and was not adequately visualized on this                            exam but is clear on recent CT (however not                            described in CT report).                           - Mass involving the left adrenal was evident and                            it was sampled with a single transgastric FNA.                            Preliminary cytology review suggests atypical                            cells, await final cytology (is this truly a                            metastatic site or is it an incidental, unrelated                            finding such as an adrenal adenoma).                           -  He does not have gastric outlet obstruction. Moderate Sedation:      N/A- Per Anesthesia Care Recommendation:           - Return patient to hospital ward for ongoing care.                           - Antibiotics for pancreatic abscess which I                            believe may be masking underlying pancreatic cancer.                           - Await final cytology from the two sites sampled                            today.                           - He may need serial imaging, repeat FNA pending                            the above. Procedure Code(s):        --- Professional ---                           [phone], Esophagogastroduodenoscopy, flexible,                            transoral; with transendoscopic ultrasound-guided                            intramural or transmural fine needle                            aspiration/biopsy(s), (includes endoscopic                            ultrasound examination limited to the esophagus,                            stomach or duodenum, and adjacent structures) Diagnosis Code(s):        --- Professional ---                            K86.89, Other specified diseases of pancreas                           R93.3, Abnormal findings on diagnostic imaging of                            other parts of digestive tract CPT copyright 2017 American [employer] Association. All rights reserved. The codes documented in this report are preliminary and upon coder review may  be revised to meet current compliance requirements. [name], MD 06/14/2018 10:36:05 AM This report has been signed electronically. Number of Addenda: 0

## 2018-06-14 NOTE — Progress Notes (Signed)
Initial Nutrition Assessment  DOCUMENTATION CODES:   Non-severe (moderate) malnutrition in context of acute illness/injury  INTERVENTION:   Once diet is advanced, provide Ensure Enlive po BID, each supplement provides 350 kcal and 20 grams of protein  NUTRITION DIAGNOSIS:   Moderate Malnutrition related to acute illness(possible pancreatic cancer, abdominal pain) as evidenced by mild muscle depletion, energy intake < 75% for > 7 days.  GOAL:   Patient will meet greater than or equal to 90% of their needs  MONITOR:   PO intake, Supplement acceptance, Labs, Weight trends, I & O's  REASON FOR ASSESSMENT:   Malnutrition Screening Tool    ASSESSMENT:   72 y.o. male with medical history significant of hypertension and GERD who presented to the emergency department complaining of worsening abdominal pain for the past 24 to 48 hours.  Symptoms started several weeks ago with nausea and vomiting, associated with weight loss reported about 35 pound and intermittent abdominal pain.    Patient reports poor appetite and not eating solid food for about a week PTA. Pt states he was having abdominal pain with intake of solids. Pt was subsisting on 3-4 Boosts and soups daily. Pt denies nausea and vomiting during this time. Pt currently NPO today, had EGD and EUS this morning. Pt eager to have a diet and would like to continue protein supplements.   Pt's UBW is 175-180 lb. Pt states this has been over several weeks. Pt has lost 23 lb over weeks (13% wt loss x several weeks, significant for time frame).  Labs reviewed. Medications reviewed.  NUTRITION - FOCUSED PHYSICAL EXAM:    Most Recent Value  Orbital Region  No depletion  Upper Arm Region  No depletion  Thoracic and Lumbar Region  Unable to assess  Buccal Region  No depletion  Temple Region  No depletion  Clavicle Bone Region  Mild depletion  Clavicle and Acromion Bone Region  No depletion  Scapular Bone Region  Moderate depletion   Dorsal Hand  No depletion  Patellar Region  Unable to assess  Anterior Thigh Region  Unable to assess  Posterior Calf Region  Unable to assess  Edema (RD Assessment)  None       Diet Order:   Diet Order           Diet NPO time specified  Diet effective midnight          EDUCATION NEEDS:   Education needs have been addressed  Skin:  Skin Assessment: Reviewed RN Assessment  Last BM:  7/10  Height:   Ht Readings from Last 1 Encounters:  06/14/18 5\' 11"  (1.803 m)    Weight:   Wt Readings from Last 1 Encounters:  06/14/18 152 lb 8.9 oz (69.2 kg)    Ideal Body Weight:  78.2 kg  BMI:  Body mass index is 21.28 kg/m.  Estimated Nutritional Needs:   Kcal:  9326-7124  Protein:  85-95g  Fluid:  2L/day   Clayton Bibles, MS, RD, LDN [place] [facility] Pager: [phone] After Hours Pager: [phone]

## 2018-06-14 NOTE — Transfer of Care (Signed)
Immediate Anesthesia Transfer of Care Note  Patient: Jeremy Mack  Procedure(s) Performed: ESOPHAGOGASTRODUODENOSCOPY (EGD) WITH PROPOFOL (N/A ) ESOPHAGEAL ENDOSCOPIC ULTRASOUND (EUS) RADIAL (N/A ) FINE NEEDLE ASPIRATION (FNA) LINEAR (N/A )  Patient Location: PACU and Endoscopy Unit  Anesthesia Type:MAC  Level of Consciousness: awake and drowsy  Airway & Oxygen Therapy: Patient Spontanous Breathing and Patient connected to nasal cannula oxygen  Post-op Assessment: Report given to RN and Post -op Vital signs reviewed and stable  Post vital signs: Reviewed and stable  Last Vitals:  Vitals Value Taken Time  BP    Temp    Pulse    Resp    SpO2      Last Pain:  Vitals:   06/14/18 0845  TempSrc: Oral  PainSc: 0-No pain         Complications: No apparent anesthesia complications

## 2018-06-14 NOTE — Anesthesia Preprocedure Evaluation (Signed)
Anesthesia Evaluation  Patient identified by MRN, date of birth, ID band Patient awake    Reviewed: Allergy & Precautions, H&P , NPO status , Patient's Chart, lab work & pertinent test results  Airway Mallampati: II   Neck ROM: full    Dental   Pulmonary Current Smoker,    breath sounds clear to auscultation       Cardiovascular hypertension,  Rhythm:regular Rate:Normal     Neuro/Psych    GI/Hepatic GERD  ,  Endo/Other  Pancreatic mass  Renal/GU      Musculoskeletal   Abdominal   Peds  Hematology   Anesthesia Other Findings   Reproductive/Obstetrics                             Anesthesia Physical Anesthesia Plan  ASA: III  Anesthesia Plan: MAC   Post-op Pain Management:    Induction: Intravenous  PONV Risk Score and Plan: 0 and Ondansetron, Propofol infusion and Treatment may vary due to age or medical condition  Airway Management Planned: Nasal Cannula  Additional Equipment:   Intra-op Plan:   Post-operative Plan:   Informed Consent: I have reviewed the patients History and Physical, chart, labs and discussed the procedure including the risks, benefits and alternatives for the proposed anesthesia with the patient or authorized representative who has indicated his/her understanding and acceptance.     Plan Discussed with: CRNA and Anesthesiologist  Anesthesia Plan Comments:         Anesthesia Quick Evaluation

## 2018-06-14 NOTE — Interval H&P Note (Signed)
History and Physical Interval Note:  06/14/2018 8:45 AM  Jeremy Mack  has presented today for surgery, with the diagnosis of Pancreatic mass, ? GOO  The various methods of treatment have been discussed with the patient and family. After consideration of risks, benefits and other options for treatment, the patient has consented to  Procedure(s): ESOPHAGOGASTRODUODENOSCOPY (EGD) WITH PROPOFOL (N/A) ESOPHAGEAL ENDOSCOPIC ULTRASOUND (EUS) RADIAL (N/A) as a surgical intervention .  The patient's history has been reviewed, patient examined, no change in status, stable for surgery.  I have reviewed the patient's chart and labs.  Questions were answered to the patient's satisfaction.     [name]

## 2018-06-14 NOTE — Consult Note (Signed)
[facility] for Infectious Disease    Date of Admission:  06/12/2018   Total days of antibiotics: 0                Reason for Consult: pancreatic abscess    Referring Provider: Ardis Hughs   Assessment: Pancreatic abscess Possible Pancreatic Cancer, metastatic  Plan: 1. Will start pt on invanz 2. Will await his Cx 3. Await path of pancreatic lesion as well as adrenal lesion  4. Hopefully narrow anbx as we have results.   Thank you so much for this truly interesting consult,  Active Problems:   Essential hypertension   Abdominal pain   Leukocytosis   Pancreatic cancer ([place])   AKI (acute kidney injury) ([place])   Metastatic cancer ([place])   Abnormal CT of the abdomen   Gastric outlet obstruction   Pancreatic mass   Malnutrition of moderate degree   . feeding supplement (ENSURE ENLIVE)  237 mL Oral BID BM    HPI: Jeremy Mack is a 72 y.o. male with hx of GERD comes to ED on 7-16 with worsening abd. His recent hx is notable for 1 month of abd pain, 35# wt loss and CT scan (7-10) which showed pancreatic mass and multiple areas suggestive of metastasis.  He underwent EGD/EUS this Am and bx of a 6 cm abscess/lesion showed purulence.He also had a 4/5 cm solid/cystic lesion.     WBC on adm 14.6 --> 6.2 He has been afebrile.   Review of Systems: Review of Systems  Constitutional: Positive for weight loss. Negative for chills and fever.  Gastrointestinal: Positive for abdominal pain. Negative for constipation and diarrhea.       Denies abd distension  Genitourinary: Negative for dysuria.  Endo/Heme/Allergies:       No lymphadenopathy.   Please see HPI. All other systems reviewed and negative.   Past Medical History:  Diagnosis Date  . Cancer ([place])   . GERD (gastroesophageal reflux disease)   . Hyperlipemia   . Hypertension     Social History   Tobacco Use  . Smoking status: Current Every Day Smoker    Packs/day: 0.50    Types: Cigars  . Smokeless  tobacco: Never Used  Substance Use Topics  . Alcohol use: Yes    Alcohol/week: 0.0 oz    Comment: Occasionally  . Drug use: No    Family History  Problem Relation Age of Onset  . Diabetes Mother   . Diabetes Maternal Grandmother      Medications:  Scheduled: . feeding supplement (ENSURE ENLIVE)  237 mL Oral BID BM    Abtx:  Anti-infectives (From admission, onward)   None        OBJECTIVE: Blood pressure 122/65, pulse 73, temperature 98.5 F (36.9 C), temperature source Oral, resp. rate 18, height _0  (1.803 m), weight 69.2 kg (152 lb 8.9 oz), SpO2 100 %.  Physical Exam  Constitutional: He appears well-developed and well-nourished.  HENT:  Head: Normocephalic and atraumatic.  Mouth/Throat: Oropharynx is clear and moist.  Eyes: Pupils are equal, round, and reactive to light. EOM are normal.  Cardiovascular: Normal rate and regular rhythm.  Pulmonary/Chest: Effort normal and breath sounds normal.  Abdominal: Bowel sounds are normal. He exhibits distension. There is no tenderness. There is no rigidity.  Musculoskeletal: He exhibits no edema.  Skin: Skin is warm and dry. No rash noted.  Psychiatric: He has a normal mood and affect.  Vitals reviewed.  Lab Results Results for orders placed or performed during the hospital encounter of 06/12/18 (from the past 48 hour(s))  I-Stat Troponin, ED (not at [facility])     Status: None   Collection Time: 06/12/18  3:49 PM  Result Value Ref Range   Troponin i, poc 0.00 0.00 - 0.08 ng/mL   Comment 3            Comment: Due to the release kinetics of cTnI, a negative result within the first hours of the onset of symptoms does not rule out myocardial infarction with certainty. If myocardial infarction is still suspected, repeat the test at appropriate intervals.   Protime-INR     Status: Abnormal   Collection Time: 06/12/18  3:52 PM  Result Value Ref Range   Prothrombin Time 15.7 (H) 11.4 - 15.2 seconds   INR 1.26      Comment: Performed at [facility], [place] [address], [place], [place] [address]  Comprehensive metabolic panel     Status: Abnormal   Collection Time: 06/13/18  3:56 AM  Result Value Ref Range   Sodium 137 135 - 145 mmol/L   Potassium 4.6 3.5 - 5.1 mmol/L   Chloride 105 98 - 111 mmol/L    Comment: Please note change in reference range.   CO2 25 22 - 32 mmol/L   Glucose, Bld 109 (H) 70 - 99 mg/dL    Comment: Please note change in reference range.   BUN 23 8 - 23 mg/dL    Comment: Please note change in reference range.   Creatinine, Ser 1.11 0.61 - 1.24 mg/dL   Calcium 8.5 (L) 8.9 - 10.3 mg/dL   Total Protein 6.8 6.5 - 8.1 g/dL   Albumin 2.4 (L) 3.5 - 5.0 g/dL   AST 20 15 - 41 U/L   ALT 15 0 - 44 U/L    Comment: Please note change in reference range.   Alkaline Phosphatase 78 38 - 126 U/L   Total Bilirubin 0.6 0.3 - 1.2 mg/dL   GFR calc non Af Amer >60 >60 mL/min   GFR calc Af Amer >60 >60 mL/min    Comment: (NOTE) The eGFR has been calculated using the CKD EPI equation. This calculation has not been validated in all clinical situations. eGFR's persistently <60 mL/min signify possible Chronic Kidney Disease.    Anion gap 7 5 - 15    Comment: Performed at [facility], [place] [address], [place], [place] [address]  CBC     Status: Abnormal   Collection Time: 06/13/18  3:56 AM  Result Value Ref Range   WBC 10.9 (H) 4.0 - 10.5 K/uL   RBC 3.44 (L) 4.22 - 5.81 MIL/uL   Hemoglobin 11.2 (L) 13.0 - 17.0 g/dL   HCT 33.7 (L) 39.0 - 52.0 %   MCV 98.0 78.0 - 100.0 fL   MCH 32.6 26.0 - 34.0 pg   MCHC 33.2 30.0 - 36.0 g/dL   RDW 14.9 11.5 - 15.5 %   Platelets 388 150 - 400 K/uL    Comment: Performed at [facility], [place] [address], [place], [place] [address]  HIV antibody     Status: None   Collection Time: 06/13/18  7:30 AM  Result Value Ref Range   HIV Screen 4th Generation wRfx Non Reactive Non Reactive    Comment:  (NOTE) Performed At: [facility] [address] [place], [place] [id number] Rush Farmer MD ME:[id number]   CBC  Status: Abnormal   Collection Time: 06/14/18  4:31 AM  Result Value Ref Range   WBC 6.2 4.0 - 10.5 K/uL   RBC 3.53 (L) 4.22 - 5.81 MIL/uL   Hemoglobin 11.5 (L) 13.0 - 17.0 g/dL   HCT 34.7 (L) 39.0 - 52.0 %   MCV 98.3 78.0 - 100.0 fL   MCH 32.6 26.0 - 34.0 pg   MCHC 33.1 30.0 - 36.0 g/dL   RDW 14.9 11.5 - 15.5 %   Platelets 369 150 - 400 K/uL    Comment: Performed at [facility], [place] [address], [place], [place] [address]  Comprehensive metabolic panel     Status: Abnormal   Collection Time: 06/14/18  4:31 AM  Result Value Ref Range   Sodium 138 135 - 145 mmol/L   Potassium 4.6 3.5 - 5.1 mmol/L   Chloride 107 98 - 111 mmol/L    Comment: Please note change in reference range.   CO2 25 22 - 32 mmol/L   Glucose, Bld 94 70 - 99 mg/dL    Comment: Please note change in reference range.   BUN 12 8 - 23 mg/dL    Comment: Please note change in reference range.   Creatinine, Ser 0.96 0.61 - 1.24 mg/dL   Calcium 8.7 (L) 8.9 - 10.3 mg/dL   Total Protein 6.7 6.5 - 8.1 g/dL   Albumin 2.5 (L) 3.5 - 5.0 g/dL   AST 38 15 - 41 U/L   ALT 21 0 - 44 U/L    Comment: Please note change in reference range.   Alkaline Phosphatase 77 38 - 126 U/L   Total Bilirubin 0.3 0.3 - 1.2 mg/dL   GFR calc non Af Amer >60 >60 mL/min   GFR calc Af Amer >60 >60 mL/min    Comment: (NOTE) The eGFR has been calculated using the CKD EPI equation. This calculation has not been validated in all clinical situations. eGFR's persistently <60 mL/min signify possible Chronic Kidney Disease.    Anion gap 6 5 - 15    Comment: Performed at [facility], [place] [address], [place], [place] [address]   No results found for: SDES, SPECREQUEST, CULT, REPTSTATUS Dg Abdomen Acute W/chest  Result Date: 06/12/2018 CLINICAL DATA:  Abdominal pain and vomiting EXAM: DG  ABDOMEN ACUTE W/ 1V CHEST COMPARISON:  CT abdomen and pelvis 06/06/2018 FINDINGS: Normal heart size, mediastinal contours, and pulmonary vascularity. Lungs clear. No pleural effusion or pneumothorax. Retained contrast in small bowel and colon from recent CT. No bowel dilatation, bowel wall thickening, or free air. Bones unremarkable. IMPRESSION: No acute abnormalities. Electronically Signed   By: Lavonia Dana M.D.   On: 06/12/2018 18:21   No results found for this or any previous visit (from the past 240 hour(s)).  Microbiology: No results found for this or any previous visit (from the past 240 hour(s)).  Radiographs and labs were personally reviewed by me.   Bobby Rumpf, MD [facility] for Infectious [facility] [phone] 06/14/2018, 3:48 PM

## 2018-06-14 NOTE — Progress Notes (Signed)
Oncology short note  I saw patient last evening and discussed current findings with him and his wife at bedside. Full note to followup EUS with Bx scheduled for 7/19 Will followup with biopsy results and CA 19-9  Sullivan Lone MD MS

## 2018-06-15 ENCOUNTER — Encounter (HOSPITAL_COMMUNITY): Payer: Self-pay | Admitting: Gastroenterology

## 2018-06-15 DIAGNOSIS — K859 Acute pancreatitis without necrosis or infection, unspecified: Secondary | ICD-10-CM

## 2018-06-15 LAB — BASIC METABOLIC PANEL
Anion gap: 7 (ref 5–15)
BUN: 11 mg/dL (ref 8–23)
CHLORIDE: 108 mmol/L (ref 98–111)
CO2: 24 mmol/L (ref 22–32)
CREATININE: 0.92 mg/dL (ref 0.61–1.24)
Calcium: 8.7 mg/dL — ABNORMAL LOW (ref 8.9–10.3)
GFR calc non Af Amer: >60 mL/min (ref >60)
Glucose, Bld: 118 mg/dL — ABNORMAL HIGH (ref 70–99)
Potassium: 4.4 mmol/L (ref 3.5–5.1)
Sodium: 139 mmol/L (ref 135–145)

## 2018-06-15 LAB — CBC
HEMATOCRIT: 32.8 % — AB (ref 39.0–52.0)
HEMOGLOBIN: 11.2 g/dL — AB (ref 13.0–17.0)
MCH: 32.8 pg (ref 26.0–34.0)
MCHC: 34.1 g/dL (ref 30.0–36.0)
MCV: 96.2 fL (ref 78.0–100.0)
Platelets: 334 10*3/uL (ref 150–400)
RBC: 3.41 MIL/uL — ABNORMAL LOW (ref 4.22–5.81)
RDW: 14.5 % (ref 11.5–15.5)
WBC: 6.3 10*3/uL (ref 4.0–10.5)

## 2018-06-15 LAB — CANCER ANTIGEN 19-9: CA 19-9: 1 U/mL (ref 0–35)

## 2018-06-15 MED ORDER — ENOXAPARIN SODIUM 40 MG/0.4ML ~~LOC~~ SOLN
40.0000 mg | SUBCUTANEOUS | Status: DC
  Administered 2018-06-15 – 2018-06-19 (×5): 40 mg via SUBCUTANEOUS
  Filled 2018-06-15 (×5): qty 0.4

## 2018-06-15 NOTE — Progress Notes (Signed)
   INFECTIOUS DISEASE PROGRESS NOTE  ID: Jeremy Mack is a 72 y.o. male with  Active Problems:   Essential hypertension   Abdominal pain   Leukocytosis   Pancreatic cancer ([place])   AKI (acute kidney injury) ([place])   Metastatic cancer ([place])   Abnormal CT of the abdomen   Gastric outlet obstruction   Pancreatic mass   Malnutrition of moderate degree  Subjective: No complaints No abd pain.   Abtx:  Anti-infectives (From admission, onward)   Start     Dose/Rate Route Frequency Ordered Stop   06/14/18 1800  ertapenem (INVANZ) 1,000 mg in sodium chloride 0.9 % 100 mL IVPB     1 g 200 mL/hr over 30 Minutes Intravenous Every 24 hours 06/14/18 1553        Medications:  Scheduled: . enoxaparin (LOVENOX) injection  40 mg Subcutaneous Q24H  . feeding supplement (ENSURE ENLIVE)  237 mL Oral BID BM    Objective: Vital signs in last 24 hours: Temp:  [98.6 F (37 C)-98.8 F (37.1 C)] 98.8 F (37.1 C) (07/19 1417) Pulse Rate:  [73-81] 81 (07/19 1417) Resp:  [16-20] 18 (07/19 1417) BP: (119-140)/(86-92) 140/92 (07/19 1417) SpO2:  [100 %] 100 % (07/19 1417)   General appearance: alert, cooperative and no distress Resp: clear to auscultation bilaterally Cardio: regular rate and rhythm GI: normal findings: bowel sounds normal and soft, non-tender and abnormal findings:  distended Extremities: edema none  Lab Results Recent Labs    06/14/18 0431 06/15/18 0429  WBC 6.2 6.3  HGB 11.5* 11.2*  HCT 34.7* 32.8*  NA 138 139  K 4.6 4.4  CL 107 108  CO2 25 24  BUN 12 11  CREATININE 0.96 0.92   Liver Panel Recent Labs    06/13/18 0356 06/14/18 0431  PROT 6.8 6.7  ALBUMIN 2.4* 2.5*  AST 20 38  ALT 15 21  ALKPHOS 78 77  BILITOT 0.6 0.3   Sedimentation Rate No results for input(s): ESRSEDRATE in the last 72 hours. C-Reactive Protein No results for input(s): CRP in the last 72 hours.  Microbiology: No results found for this or any previous visit (from the past 240  hour(s)).  Studies/Results: No results found.   Assessment/Plan: Pancreatic abscess Possible Pancreatic Cancer, metastatic   Total days of antibiotics: 1 invanz  No change in anbx Await path Await Cx data Will f/u over w/e.          Bobby Rumpf MD, FACP Infectious Diseases (pager) [phone] [email or web address] 06/15/2018, 3:53 PM  LOS: 3 days

## 2018-06-15 NOTE — Care Management Important Message (Signed)
Important Message  Patient Details  Name: Jeremy Mack MRN: [id number] Date of Birth: 04-21-1946   Medicare Important Message Given:  Yes    Kerin Salen 06/15/2018, 11:42 AMImportant Message  Patient Details  Name: Jeremy Mack MRN: [id number] Date of Birth: [birth year]   Medicare Important Message Given:  Yes    Kerin Salen 06/15/2018, 11:42 AM

## 2018-06-15 NOTE — Progress Notes (Addendum)
PROGRESS NOTE    Jeremy Mack  PNP:[id number] DOB: [birth year] DOA: 06/12/2018 PCP: [place] [facility], FNP     Brief Narrative:  Jeremy Mack is a 72 y.o. male with medical history significant of hypertension and GERD who presented to the emergency department complaining of worsening abdominal pain for the past 24 to 48 hours.  Symptoms started several weeks ago with nausea and vomiting, associated with weight loss reported about 35 pound and intermittent abdominal pain.  Patient saw his primary care provider who performed a CT scan which is concerning for metastatic pancreatic cancer.  Now suddenly abdominal pain has worsened, he is not able to tolerate oral intake.  No bowel movement since last week. Oncology was consulted in the ED who recommended GI consulted. He underwent EGD/EUS 7/17 which revealed 6 cm pancreatic abscess with very abundant neutrophils, 4.5 cm mixed solid/cystic lesion.  Also biopsy of left adrenal.  No gastric outlet obstruction seen  New events last 24 hours / Subjective: No complaints today. Denies nausea, vomiting, abdominal pain. Tolerating breakfast.   Assessment & Plan:   Active Problems:   Essential hypertension   Abdominal pain   Leukocytosis   Pancreatic cancer ([place])   AKI (acute kidney injury) ([place])   Metastatic cancer ([facility])   Abnormal CT of the abdomen   Gastric outlet obstruction   Pancreatic mass   Malnutrition of moderate degree   Pancreatic abscess  -GI consulted -S/p EGD/EUS, found involving the pancreas.  6 cm abscess with very abundant neutrophils, 4.5 cm mixed solid/cystic lesion.  Also biopsy of left adrenal.  No gastric outlet obstruction seen -ID following, on Invanz. 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  6 cm abscess with very abundant neutrophils, 4.5 cm mixed solid/cystic lesion.  Also biopsy of left adrenal.  No gastric outlet obstruction seen -CA19-9 is 1  -Await pathology result  -Oncology consulted, seen by Dr. Irene Limbo   Acute kidney injury -Resolved    DVT prophylaxis: Lovenox  Code Status: Partial code, DNI  Family Communication: No family at bedside Disposition Plan: Pending biopsy result, specialty recommendations    Consultants:   GI  Oncology   ID   Procedures:   EGD/EUS 7/1 Impression:       - Two lesions involving the pancreas. The largest                            directly abuts the stomach in the neck/body of the                            pancreas, measures at least 6cm on this exam, is                            mixed cystic/solid. Preliminary FNA results shows                            very abundant neutophils (abscess). The smaller  lesion is about 4.5cm across, appears mixed                            solid/cystic as well, involves the body/tail of the                            pancreas and was not adequately visualized on this                            exam but is clear on recent CT (however not                            described in CT report).                           - Mass involving the left adrenal was evident and                            it was sampled with a single transgastric FNA.                            Preliminary cytology review suggests atypical                            cells, await final cytology (is this truly a                            metastatic site or is it an incidental, unrelated                            finding such as an adrenal adenoma).                           - He does not have gastric outlet obstruction.   Antimicrobials:  Anti-infectives (From admission, onward)   Start      Dose/Rate Route Frequency Ordered Stop   06/14/18 1800  ertapenem (INVANZ) 1,000 mg in sodium chloride 0.9 % 100 mL IVPB     1 g 200 mL/hr over 30 Minutes Intravenous Every 24 hours 06/14/18 1553         Objective: Vitals:   06/14/18 1037 06/14/18 1354 06/14/18 2045 06/15/18 0444  BP: 140/90 122/65 133/86 119/89  Pulse: 68 73 75 73  Resp: 18 18 20 16   Temp:  98.5 F (36.9 C) 98.6 F (37 C) 98.6 F (37 C)  TempSrc:  Oral Oral Oral  SpO2: 100% 100% 100% 100%  Weight:      Height:        Intake/Output Summary (Last 24 hours) at 06/15/2018 1008 Last data filed at 06/15/2018 0444 Gross per 24 hour  Intake 840 ml  Output 1375 ml  Net -535 ml   Filed Weights   06/12/18 0949 06/12/18 2142 06/14/18 0845  Weight: 72.6 kg (160 lb) 69.2 kg (152 lb 8.9 oz) 69.2 kg (152 lb 8.9 oz)    Examination: General exam: Appears calm and comfortable  Respiratory system: Clear to auscultation. Respiratory effort normal. Cardiovascular system: S1 &  S2 heard, RRR. No JVD, murmurs, rubs, gallops or clicks. No pedal edema. Gastrointestinal system: Abdomen is nondistended, soft and nontender. No organomegaly or masses felt. Normal bowel sounds heard. Central nervous system: Alert and oriented. No focal neurological deficits. Extremities: Symmetric 5 x 5 power. Skin: No rashes, lesions or ulcers Psychiatry: Judgement and insight appear normal. Mood & affect appropriate.    Data Reviewed: I have personally reviewed following labs and imaging studies  CBC: Recent Labs  Lab 06/12/18 1142 06/13/18 0356 06/14/18 0431 06/15/18 0429  WBC 14.6* 10.9* 6.2 6.3  HGB 13.4 11.2* 11.5* 11.2*  HCT 38.9* 33.7* 34.7* 32.8*  MCV 97.0 98.0 98.3 96.2  PLT 425* 388 369 850   Basic Metabolic Panel: Recent Labs  Lab 06/12/18 1142 06/13/18 0356 06/14/18 0431 06/15/18 0429  NA 137 137 138 139  K 4.9 4.6 4.6 4.4  CL 100 105 107 108  CO2 25 25 25 24   GLUCOSE 147* 109* 94 118*  BUN 26* 23 12 11     CREATININE 1.47* 1.11 0.96 0.92  CALCIUM 9.4 8.5* 8.7* 8.7*   GFR: Estimated Creatinine Clearance: 73.1 mL/min (by C-G formula based on SCr of 0.92 mg/dL). Liver Function Tests: Recent Labs  Lab 06/12/18 1142 06/13/18 0356 06/14/18 0431  AST 22 20 38  ALT 18 15 21   ALKPHOS 94 78 77  BILITOT 0.7 0.6 0.3  PROT 8.2* 6.8 6.7  ALBUMIN 3.1* 2.4* 2.5*   Recent Labs  Lab 06/12/18 1142  LIPASE 22   No results for input(s): AMMONIA in the last 168 hours. Coagulation Profile: Recent Labs  Lab 06/12/18 1552  INR 1.26   Cardiac Enzymes: No results for input(s): CKTOTAL, CKMB, CKMBINDEX, TROPONINI in the last 168 hours. BNP (last 3 results) No results for input(s): PROBNP in the last 8760 hours. HbA1C: No results for input(s): HGBA1C in the last 72 hours. CBG: No results for input(s): GLUCAP in the last 168 hours. Lipid Profile: No results for input(s): CHOL, HDL, LDLCALC, TRIG, CHOLHDL, LDLDIRECT in the last 72 hours. Thyroid Function Tests: No results for input(s): TSH, T4TOTAL, FREET4, T3FREE, THYROIDAB in the last 72 hours. Anemia Panel: No results for input(s): VITAMINB12, FOLATE, FERRITIN, TIBC, IRON, RETICCTPCT in the last 72 hours. Sepsis Labs: No results for input(s): PROCALCITON, LATICACIDVEN in the last 168 hours.  No results found for this or any previous visit (from the past 240 hour(s)).     Radiology Studies: No results found.    Scheduled Meds: . feeding supplement (ENSURE ENLIVE)  237 mL Oral BID BM   Continuous Infusions: . ertapenem       LOS: 3 days    Time spent: 20 minutes   Dessa Phi, DO [facility] [email or web address] Password [email or web address] 06/15/2018, 10:08 AM

## 2018-06-15 NOTE — Progress Notes (Signed)
   06/15/18 1400  Clinical Encounter Type  Visited With Patient and family together  Visit Type Initial;Psychological support;Spiritual support  Referral From Nurse  Consult/Referral To Chaplain  Spiritual Encounters  Spiritual Needs Literature;Emotional;Other (Comment) (Spiritual Care Conversation/Support)  Stress Factors  Patient Stress Factors Other (Comment) (Advance Directive Information )  Family Stress Factors Other (Comment)  Advance Directives (For Healthcare)  Does Patient Have a Medical Advance Directive? No  Would patient like information on creating a medical advance directive? Yes (Inpatient - patient requests chaplain consult to create a medical advance directive) (Paperwork Given to Patient )   I visited with the patient per [place] for an Advance Directive.  The patient stated that he wants his wife to make his medical decisions in the event that he is unable to make those for himself. I informed the patient that he is already protected by law and that those healthcare decisions would default to his wife in the event that he was unable to make those decisions.  The patient is going to look over the Advance directive paperwork and let a nurse know if he would like to complete.   Please, contact Spiritual Care for further assistance.   Chaplain Shanon Ace M.Div., [facility]

## 2018-06-15 NOTE — Progress Notes (Signed)
     [facility] Progress Note  CC:  Pancreatic masses  Subjective:  Feeling good.  No further nausea, vomiting, abdominal pain.  Tolerating food.  See by ID and started on Invanz.  EUS result as follows:  - Two lesions involving the pancreas. The largest directly abuts the stomach in the neck/body of the pancreas, measures at least 6cm on this exam, is mixed cystic/solid. Preliminary FNA results shows very abundant neutophils (abscess). The smaller lesion is about 4.5cm across, appears mixed solid/cystic as well, involves the body/tail of the pancreas and was not adequately visualized on this exam but is clear on recent CT (however not described in CT report). - Mass involving the left adrenal was evident and it was sampled with a single transgastric FNA. Preliminary cytology review suggests atypical cells, await final cytology (is this truly a metastatic site or is it an incidental, unrelated finding such as an adrenal adenoma). - He does not have gastric outlet obstruction.  Objective:  Vital signs in last 24 hours: Temp:  [97.8 F (36.6 C)-98.6 F (37 C)] 98.6 F (37 C) (07/19 0444) Pulse Rate:  [68-75] 73 (07/19 0444) Resp:  [16-20] 16 (07/19 0444) BP: (104-140)/(65-90) 119/89 (07/19 0444) SpO2:  [100 %] 100 % (07/19 0444) Last BM Date: 06/06/18 General:  Alert, Well-developed, in NAD Heart:  Regular rate and rhythm; no murmurs Pulm:  CTAB.  No increased WOB. Abdomen:  Soft, non-distended.  BS present.  Non-tender. Extremities:  Without edema. Neurologic:  Alert and oriented x 4;  grossly normal neurologically. Psych:  Alert and cooperative. Normal mood and affect.  Intake/Output from previous day: 07/18 0701 - 07/19 0700 In: 840 [P.O.:240; I.V.:600] Out: 3662 [Urine:1375]  Lab Results: Recent Labs    06/13/18 0356 06/14/18 0431 06/15/18 0429  WBC 10.9* 6.2 6.3  HGB 11.2* 11.5* 11.2*  HCT 33.7* 34.7* 32.8*  PLT 388 369 334   BMET Recent Labs      06/13/18 0356 06/14/18 0431 06/15/18 0429  NA 137 138 139  K 4.6 4.6 4.4  CL 105 107 108  CO2 25 25 24   GLUCOSE 109* 94 118*  BUN 23 12 11   CREATININE 1.11 0.96 0.92  CALCIUM 8.5* 8.7* 8.7*   LFT Recent Labs    06/14/18 0431  PROT 6.7  ALBUMIN 2.5*  AST 38  ALT 21  ALKPHOS 77  BILITOT 0.3   PT/INR Recent Labs    06/12/18 1552  LABPROT 15.7*  INR 1.26   Assessment / Plan: *New diagnosis of suspected metastatic pancreatic cancer.  EUS on 7/18 revealed pus on FNA.  Was able to get biopsies from left adrenal lesion as well; unsure if this will yield a diagnosis.  Will await path/cytology results.  Will likely need some weeks of antibiotics then re-imaging and re-EUS if no definite diagnosis obtained.  **Continue antibiotics per ID.  We appreciate there guidance. **Await path/cytology results.   LOS: 3 days   Jeremy Mack. Meredeth Furber  06/15/2018, 9:22 AM

## 2018-06-15 NOTE — Anesthesia Postprocedure Evaluation (Signed)
Anesthesia Post Note  Patient: Jeremy Mack  Procedure(s) Performed: ESOPHAGOGASTRODUODENOSCOPY (EGD) WITH PROPOFOL (N/A ) ESOPHAGEAL ENDOSCOPIC ULTRASOUND (EUS) RADIAL (N/A ) FINE NEEDLE ASPIRATION (FNA) LINEAR (N/A )     Patient location during evaluation: PACU Anesthesia Type: MAC Level of consciousness: awake and alert Pain management: pain level controlled Vital Signs Assessment: post-procedure vital signs reviewed and stable Respiratory status: spontaneous breathing, nonlabored ventilation, respiratory function stable and patient connected to nasal cannula oxygen Cardiovascular status: stable and blood pressure returned to baseline Postop Assessment: no apparent nausea or vomiting Anesthetic complications: no    Last Vitals:  Vitals:   06/14/18 2045 06/15/18 0444  BP: 133/86 119/89  Pulse: 75 73  Resp: 20 16  Temp: 37 C 37 C  SpO2: 100% 100%    Last Pain:  Vitals:   06/15/18 0444  TempSrc: Oral  PainSc:                  [place] S

## 2018-06-16 LAB — BASIC METABOLIC PANEL
ANION GAP: 7 (ref 5–15)
BUN: 13 mg/dL (ref 8–23)
CALCIUM: 8.7 mg/dL — AB (ref 8.9–10.3)
CO2: 25 mmol/L (ref 22–32)
Chloride: 108 mmol/L (ref 98–111)
Creatinine, Ser: 0.83 mg/dL (ref 0.61–1.24)
GFR calc Af Amer: >60 mL/min (ref >60)
GLUCOSE: 123 mg/dL — AB (ref 70–99)
POTASSIUM: 4.5 mmol/L (ref 3.5–5.1)
Sodium: 140 mmol/L (ref 135–145)

## 2018-06-16 LAB — CBC
HCT: 35.6 % — ABNORMAL LOW (ref 39.0–52.0)
Hemoglobin: 11.8 g/dL — ABNORMAL LOW (ref 13.0–17.0)
MCH: 32.3 pg (ref 26.0–34.0)
MCHC: 33.1 g/dL (ref 30.0–36.0)
MCV: 97.5 fL (ref 78.0–100.0)
PLATELETS: 337 10*3/uL (ref 150–400)
RBC: 3.65 MIL/uL — ABNORMAL LOW (ref 4.22–5.81)
RDW: 14.9 % (ref 11.5–15.5)
WBC: 6.5 10*3/uL (ref 4.0–10.5)

## 2018-06-16 MED ORDER — ALUM & MAG HYDROXIDE-SIMETH 200-200-20 MG/5ML PO SUSP
30.0000 mL | ORAL | Status: DC | PRN
  Administered 2018-06-16 – 2018-06-19 (×3): 30 mL via ORAL
  Filled 2018-06-16 (×3): qty 30

## 2018-06-16 NOTE — Progress Notes (Signed)
   INFECTIOUS DISEASE PROGRESS NOTE  ID: Alphons Burgert is a 72 y.o. male with  Principal Problem:   Pancreatic abscess Active Problems:   Essential hypertension   Abdominal pain   Leukocytosis   Pancreatic cancer ([place])   AKI (acute kidney injury) ([place])   Metastatic cancer (HCC)   Abnormal CT of the abdomen   Pancreatic mass   Malnutrition of moderate degree  Subjective: Up ambulating in room.   Abtx:  Anti-infectives (From admission, onward)   Start     Dose/Rate Route Frequency Ordered Stop   06/14/18 1800  ertapenem (INVANZ) 1,000 mg in sodium chloride 0.9 % 100 mL IVPB     1 g 200 mL/hr over 30 Minutes Intravenous Every 24 hours 06/14/18 1553        Medications:  Scheduled: . enoxaparin (LOVENOX) injection  40 mg Subcutaneous Q24H  . feeding supplement (ENSURE ENLIVE)  237 mL Oral BID BM    Objective: Vital signs in last 24 hours: Temp:  [98.2 F (36.8 C)-98.9 F (37.2 C)] 98.5 F (36.9 C) (07/20 1248) Pulse Rate:  [84-85] 84 (07/20 1248) Resp:  [16-20] 16 (07/20 1248) BP: (112-139)/(83-92) 112/83 (07/20 1248) SpO2:  [100 %] 100 % (07/20 1248)   General appearance: alert, cooperative and no distress  Lab Results Recent Labs    06/15/18 0429 06/16/18 0344  WBC 6.3 6.5  HGB 11.2* 11.8*  HCT 32.8* 35.6*  NA 139 140  K 4.4 4.5  CL 108 108  CO2 24 25  BUN 11 13  CREATININE 0.92 0.83   Liver Panel Recent Labs    06/14/18 0431  PROT 6.7  ALBUMIN 2.5*  AST 38  ALT 21  ALKPHOS 77  BILITOT 0.3   Sedimentation Rate No results for input(s): ESRSEDRATE in the last 72 hours. C-Reactive Protein No results for input(s): CRP in the last 72 hours.  Microbiology: No results found for this or any previous visit (from the past 240 hour(s)).  Studies/Results: No results found.   Assessment/Plan: Pancreatic abscess Possible Pancreatic Cancer, metastatic   Total days of antibiotics: 2 invanz  WBC normal, afebrile.  No change in anbx Await  path Await Cx data (not sent?) Will f/u on 7-22         Bobby Rumpf MD, FACP Infectious Diseases (pager) [phone] [email or web address] 06/16/2018, 3:22 PM  LOS: 4 days

## 2018-06-16 NOTE — Progress Notes (Signed)
PROGRESS NOTE    Jeremy Mack  AVW:[id number] DOB: 1946/01/04 DOA: 06/12/2018 PCP: Golden Circle, FNP     Brief Narrative:  Jeremy Mack is a 72 y.o. male with medical history significant of hypertension and GERD who presented to the emergency department complaining of worsening abdominal pain for the past 24 to 48 hours.  Symptoms started several weeks ago with nausea and vomiting, associated with weight loss reported about 35 pound and intermittent abdominal pain.  Patient saw his primary care provider who performed a CT scan which is concerning for metastatic pancreatic cancer.  Now suddenly abdominal pain has worsened, he is not able to tolerate oral intake.  No bowel movement since last week. Oncology was consulted in the ED who recommended GI consulted. He underwent EGD/EUS 7/17 which revealed 6 cm pancreatic abscess with very abundant neutrophils, 4.5 cm mixed solid/cystic lesion.  Also biopsy of left adrenal.  No gastric outlet obstruction seen  New events last 24 hours / Subjective: No new complaints or issues, tolerating food, no nausea, vomiting, abdominal pain.  Assessment & Plan:   Active Problems:   Essential hypertension   Abdominal pain   Leukocytosis   Pancreatic cancer ([place])   AKI (acute kidney injury) ([place])   Metastatic cancer (HCC)   Abnormal CT of the abdomen   Gastric outlet obstruction   Pancreatic mass   Malnutrition of moderate degree   Pancreatic abscess   Pancreatic abscess  -GI consulted -S/p EGD/EUS, found involving the pancreas.  6 cm abscess with very abundant neutrophils, 4.5 cm mixed solid/cystic lesion.  Also biopsy of left adrenal.  No gastric outlet obstruction seen -ID following, on Invanz. 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  6 cm abscess with very abundant neutrophils, 4.5 cm mixed solid/cystic lesion.  Also biopsy of left adrenal.  No gastric outlet obstruction seen -CA19-9 is 1  -Await pathology result  -Oncology consulted, seen by Dr. Irene Limbo   Acute kidney injury -Resolved    DVT prophylaxis: Lovenox  Code Status: Partial code, DNI  Family Communication: No family at bedside Disposition Plan: Pending biopsy result, specialty recommendations    Consultants:   GI  Oncology   ID   Procedures:   EGD/EUS 7/1 Impression:       - Two lesions involving the pancreas. The largest                            directly abuts the stomach in the neck/body of the                            pancreas, measures at least 6cm on this exam, is                            mixed cystic/solid. Preliminary FNA results shows                            very abundant neutophils (abscess). The smaller  lesion is about 4.5cm across, appears mixed                            solid/cystic as well, involves the body/tail of the                            pancreas and was not adequately visualized on this                            exam but is clear on recent CT (however not                            described in CT report).                           - Mass involving the left adrenal was evident and                            it was sampled with a single transgastric FNA.                            Preliminary cytology review suggests atypical                            cells, await final cytology (is this truly a                            metastatic site or is it an incidental, unrelated                            finding such as an adrenal adenoma).                           - He does not have gastric outlet obstruction.   Antimicrobials:  Anti-infectives (From  admission, onward)   Start     Dose/Rate Route Frequency Ordered Stop   06/14/18 1800  ertapenem (INVANZ) 1,000 mg in sodium chloride 0.9 % 100 mL IVPB     1 g 200 mL/hr over 30 Minutes Intravenous Every 24 hours 06/14/18 1553         Objective: Vitals:   06/15/18 0444 06/15/18 1417 06/15/18 2016 06/16/18 0410  BP: 119/89 (!) 140/92 (!) 139/92 119/86  Pulse: 73 81 85 84  Resp: 16 18 20 18   Temp: 98.6 F (37 C) 98.8 F (37.1 C) 98.2 F (36.8 C) 98.9 F (37.2 C)  TempSrc: Oral Oral Oral Oral  SpO2: 100% 100% 100% 100%  Weight:      Height:        Intake/Output Summary (Last 24 hours) at 06/16/2018 1125 Last data filed at 06/16/2018 0410 Gross per 24 hour  Intake 200 ml  Output 350 ml  Net -150 ml   Filed Weights   06/12/18 0949 06/12/18 2142 06/14/18 0845  Weight: 72.6 kg (160 lb) 69.2 kg (152 lb 8.9 oz) 69.2 kg (152 lb 8.9 oz)    Examination: General exam: Appears calm and comfortable  Respiratory system: Clear to auscultation. Respiratory  effort normal. Cardiovascular system: S1 & S2 heard, RRR. No JVD, murmurs, rubs, gallops or clicks. No pedal edema. Gastrointestinal system: Abdomen is nondistended, soft and nontender. No organomegaly or masses felt. Normal bowel sounds heard. Central nervous system: Alert and oriented. No focal neurological deficits. Extremities: Symmetric 5 x 5 power. Skin: No rashes, lesions or ulcers Psychiatry: Judgement and insight appear normal. Mood & affect appropriate.   Data Reviewed: I have personally reviewed following labs and imaging studies  CBC: Recent Labs  Lab 06/12/18 1142 06/13/18 0356 06/14/18 0431 06/15/18 0429 06/16/18 0344  WBC 14.6* 10.9* 6.2 6.3 6.5  HGB 13.4 11.2* 11.5* 11.2* 11.8*  HCT 38.9* 33.7* 34.7* 32.8* 35.6*  MCV 97.0 98.0 98.3 96.2 97.5  PLT 425* 388 369 334 194   Basic Metabolic Panel: Recent Labs  Lab 06/12/18 1142 06/13/18 0356 06/14/18 0431 06/15/18 0429 06/16/18 0344  NA 137 137 138 139  140  K 4.9 4.6 4.6 4.4 4.5  CL 100 105 107 108 108  CO2 25 25 25 24 25   GLUCOSE 147* 109* 94 118* 123*  BUN 26* 23 12 11 13   CREATININE 1.47* 1.11 0.96 0.92 0.83  CALCIUM 9.4 8.5* 8.7* 8.7* 8.7*   GFR: Estimated Creatinine Clearance: 81.1 mL/min (by C-G formula based on SCr of 0.83 mg/dL). Liver Function Tests: Recent Labs  Lab 06/12/18 1142 06/13/18 0356 06/14/18 0431  AST 22 20 38  ALT 18 15 21   ALKPHOS 94 78 77  BILITOT 0.7 0.6 0.3  PROT 8.2* 6.8 6.7  ALBUMIN 3.1* 2.4* 2.5*   Recent Labs  Lab 06/12/18 1142  LIPASE 22   No results for input(s): AMMONIA in the last 168 hours. Coagulation Profile: Recent Labs  Lab 06/12/18 1552  INR 1.26   Cardiac Enzymes: No results for input(s): CKTOTAL, CKMB, CKMBINDEX, TROPONINI in the last 168 hours. BNP (last 3 results) No results for input(s): PROBNP in the last 8760 hours. HbA1C: No results for input(s): HGBA1C in the last 72 hours. CBG: No results for input(s): GLUCAP in the last 168 hours. Lipid Profile: No results for input(s): CHOL, HDL, LDLCALC, TRIG, CHOLHDL, LDLDIRECT in the last 72 hours. Thyroid Function Tests: No results for input(s): TSH, T4TOTAL, FREET4, T3FREE, THYROIDAB in the last 72 hours. Anemia Panel: No results for input(s): VITAMINB12, FOLATE, FERRITIN, TIBC, IRON, RETICCTPCT in the last 72 hours. Sepsis Labs: No results for input(s): PROCALCITON, LATICACIDVEN in the last 168 hours.  No results found for this or any previous visit (from the past 240 hour(s)).     Radiology Studies: No results found.    Scheduled Meds: . enoxaparin (LOVENOX) injection  40 mg Subcutaneous Q24H  . feeding supplement (ENSURE ENLIVE)  237 mL Oral BID BM   Continuous Infusions: . ertapenem Stopped (06/15/18 1824)     LOS: 4 days    Time spent: 20 minutes   Dessa Phi, DO [facility] [email or web address] Password [facility] 06/16/2018, 11:25 AM

## 2018-06-17 NOTE — Progress Notes (Signed)
PROGRESS NOTE    Jeremy Mack  LNL:[id number] DOB: 1946-11-09 DOA: 06/12/2018 PCP: [facility] Circle, FNP     Brief Narrative:  Jeremy Mack is a 72 y.o. male with medical history significant of hypertension and GERD who presented to the emergency department complaining of worsening abdominal pain for the past 24 to 48 hours.  Symptoms started several weeks ago with nausea and vomiting, associated with weight loss reported about 35 pound and intermittent abdominal pain.  Patient saw his primary care provider who performed a CT scan which is concerning for metastatic pancreatic cancer.  Now suddenly abdominal pain has worsened, he is not able to tolerate oral intake.  No bowel movement since last week. Oncology was consulted in the ED who recommended GI consulted. He underwent EGD/EUS 7/17 which revealed 6 cm pancreatic abscess with very abundant neutrophils, 4.5 cm mixed solid/cystic lesion.  Also biopsy of left adrenal.  No gastric outlet obstruction seen  New events last 24 hours / Subjective: No new complaints. Feeling well. Ambulating hall and tolerating diet.   Assessment & Plan:   Principal Problem:   Pancreatic abscess Active Problems:   Essential hypertension   Abdominal pain   Leukocytosis   Pancreatic cancer (H[facility])   AKI (acute kidney injury) ([place])   Metastatic cancer (H[facility])   Abnormal CT of the abdomen   Pancreatic mass   Malnutrition of moderate degree   Pancreatic abscess  -GI consulted -S/p EGD/EUS, found involving the pancreas.  6 cm abscess with very abundant neutrophils, 4.5 cm mixed solid/cystic lesion.  Also biopsy of left adrenal.  No gastric outlet obstruction seen -ID following, on Invanz. 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  6 cm abscess with very abundant neutrophils, 4.5 cm mixed solid/cystic lesion.  Also biopsy of left adrenal.  No gastric outlet obstruction seen -CA19-9 is 1  -Await pathology result  -Oncology consulted, seen by Dr. Irene Limbo   Acute kidney injury -Resolved    DVT prophylaxis: Lovenox  Code Status: Partial code, DNI  Family Communication: No family at bedside Disposition Plan: Pending biopsy result, specialty recommendations    Consultants:   GI  Oncology   ID   Procedures:   EGD/EUS 7/1 Impression:       - Two lesions involving the pancreas. The largest                            directly abuts the stomach in the neck/body of the                            pancreas, measures at least 6cm on this exam, is                            mixed cystic/solid. Preliminary FNA results shows                            very abundant neutophils (abscess). The smaller  lesion is about 4.5cm across, appears mixed                            solid/cystic as well, involves the body/tail of the                            pancreas and was not adequately visualized on this                            exam but is clear on recent CT (however not                            described in CT report).                           - Mass involving the left adrenal was evident and                            it was sampled with a single transgastric FNA.                            Preliminary cytology review suggests atypical                            cells, await final cytology (is this truly a                            metastatic site or is it an incidental, unrelated                            finding such as an adrenal adenoma).                           - He does not have gastric outlet obstruction.   Antimicrobials:  Anti-infectives (From  admission, onward)   Start     Dose/Rate Route Frequency Ordered Stop   06/14/18 1800  ertapenem (INVANZ) 1,000 mg in sodium chloride 0.9 % 100 mL IVPB     1 g 200 mL/hr over 30 Minutes Intravenous Every 24 hours 06/14/18 1553         Objective: Vitals:   06/16/18 0410 06/16/18 1248 06/16/18 2023 06/17/18 0547  BP: 119/86 112/83 130/84 130/84  Pulse: 84 84 72 72  Resp: 18 16 15 16   Temp: 98.9 F (37.2 C) 98.5 F (36.9 C) 99.1 F (37.3 C) 98.6 F (37 C)  TempSrc: Oral Oral Oral Oral  SpO2: 100% 100% 100% 100%  Weight:      Height:        Intake/Output Summary (Last 24 hours) at 06/17/2018 0949 Last data filed at 06/17/2018 0549 Gross per 24 hour  Intake 360 ml  Output 550 ml  Net -190 ml   Filed Weights   06/12/18 0949 06/12/18 2142 06/14/18 0845  Weight: 72.6 kg (160 lb) 69.2 kg (152 lb 8.9 oz) 69.2 kg (152 lb 8.9 oz)   Examination: General exam: Appears calm and comfortable  Respiratory system: Clear to auscultation. Respiratory effort normal. Cardiovascular  system: S1 & S2 heard, RRR. No JVD, murmurs, rubs, gallops or clicks. No pedal edema. Gastrointestinal system: Abdomen is nondistended, soft and nontender. No organomegaly or masses felt. Normal bowel sounds heard. Central nervous system: Alert and oriented. No focal neurological deficits. Extremities: Symmetric 5 x 5 power. Skin: No rashes, lesions or ulcers Psychiatry: Judgement and insight appear normal. Mood & affect appropriate.    Data Reviewed: I have personally reviewed following labs and imaging studies  CBC: Recent Labs  Lab 06/12/18 1142 06/13/18 0356 06/14/18 0431 06/15/18 0429 06/16/18 0344  WBC 14.6* 10.9* 6.2 6.3 6.5  HGB 13.4 11.2* 11.5* 11.2* 11.8*  HCT 38.9* 33.7* 34.7* 32.8* 35.6*  MCV 97.0 98.0 98.3 96.2 97.5  PLT 425* 388 369 334 701   Basic Metabolic Panel: Recent Labs  Lab 06/12/18 1142 06/13/18 0356 06/14/18 0431 06/15/18 0429 06/16/18 0344  NA 137 137 138 139 140  K  4.9 4.6 4.6 4.4 4.5  CL 100 105 107 108 108  CO2 25 25 25 24 25   GLUCOSE 147* 109* 94 118* 123*  BUN 26* 23 12 11 13   CREATININE 1.47* 1.11 0.96 0.92 0.83  CALCIUM 9.4 8.5* 8.7* 8.7* 8.7*   GFR: Estimated Creatinine Clearance: 81.1 mL/min (by C-G formula based on SCr of 0.83 mg/dL). Liver Function Tests: Recent Labs  Lab 06/12/18 1142 06/13/18 0356 06/14/18 0431  AST 22 20 38  ALT 18 15 21   ALKPHOS 94 78 77  BILITOT 0.7 0.6 0.3  PROT 8.2* 6.8 6.7  ALBUMIN 3.1* 2.4* 2.5*   Recent Labs  Lab 06/12/18 1142  LIPASE 22   No results for input(s): AMMONIA in the last 168 hours. Coagulation Profile: Recent Labs  Lab 06/12/18 1552  INR 1.26   Cardiac Enzymes: No results for input(s): CKTOTAL, CKMB, CKMBINDEX, TROPONINI in the last 168 hours. BNP (last 3 results) No results for input(s): PROBNP in the last 8760 hours. HbA1C: No results for input(s): HGBA1C in the last 72 hours. CBG: No results for input(s): GLUCAP in the last 168 hours. Lipid Profile: No results for input(s): CHOL, HDL, LDLCALC, TRIG, CHOLHDL, LDLDIRECT in the last 72 hours. Thyroid Function Tests: No results for input(s): TSH, T4TOTAL, FREET4, T3FREE, THYROIDAB in the last 72 hours. Anemia Panel: No results for input(s): VITAMINB12, FOLATE, FERRITIN, TIBC, IRON, RETICCTPCT in the last 72 hours. Sepsis Labs: No results for input(s): PROCALCITON, LATICACIDVEN in the last 168 hours.  No results found for this or any previous visit (from the past 240 hour(s)).     Radiology Studies: No results found.    Scheduled Meds: . enoxaparin (LOVENOX) injection  40 mg Subcutaneous Q24H  . feeding supplement (ENSURE ENLIVE)  237 mL Oral BID BM   Continuous Infusions: . ertapenem Stopped (06/16/18 1955)     LOS: 5 days    Time spent: 20 minutes   Dessa Phi, DO [facility] [email or web address] Password [facility] 06/17/2018, 9:49 AM

## 2018-06-18 DIAGNOSIS — E279 Disorder of adrenal gland, unspecified: Secondary | ICD-10-CM

## 2018-06-18 NOTE — Progress Notes (Signed)
PROGRESS NOTE    Jeremy Mack  LPF:[id number] DOB: 07-May-1946 DOA: 06/12/2018 PCP: [facility], FNP     Brief Narrative:  Jeremy Mack is a 72 y.o. male with medical history significant of hypertension and GERD who presented to the emergency department complaining of worsening abdominal pain for the past 24 to 48 hours.  Symptoms started several weeks ago with nausea and vomiting, associated with weight loss reported about 35 pound and intermittent abdominal pain.  Patient saw his primary care provider who performed a CT scan which is concerning for metastatic pancreatic cancer.  Now suddenly abdominal pain has worsened, he is not able to tolerate oral intake.  No bowel movement since last week. Oncology was consulted in the ED who recommended GI consulted. He underwent EGD/EUS 7/17 which revealed 6 cm pancreatic abscess with very abundant neutrophils, 4.5 cm mixed solid/cystic lesion.  Also biopsy of left adrenal.  No gastric outlet obstruction seen  New events last 24 hours / Subjective: No complaints, doing well. Wondering about results and when he can go home.   Assessment & Plan:   Principal Problem:   Pancreatic abscess Active Problems:   Essential hypertension   Abdominal pain   Leukocytosis   Pancreatic cancer (HCC)   AKI (acute kidney injury) ([place])   Metastatic cancer (HCC)   Abnormal CT of the abdomen   Pancreatic mass   Malnutrition of moderate degree   Pancreatic abscess  -GI consulted -S/p EGD/EUS, found involving the pancreas.  6 cm abscess with very abundant neutrophils, 4.5 cm mixed solid/cystic lesion.  Also biopsy of left adrenal.  No gastric outlet obstruction seen -ID following, on Invanz. 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  6 cm abscess with very abundant neutrophils, 4.5 cm mixed solid/cystic lesion.  Also biopsy of left adrenal.  No gastric outlet obstruction seen -CA19-9 is 1  -Await pathology result  -Oncology consulted, seen by Dr. Irene Limbo   Acute kidney injury -Resolved    DVT prophylaxis: Lovenox  Code Status: Partial code, DNI  Family Communication: Wife at bedside Disposition Plan: Pending biopsy result, specialty recommendations    Consultants:   GI  Oncology   ID   Procedures:   EGD/EUS 7/1 Impression:       - Two lesions involving the pancreas. The largest                            directly abuts the stomach in the neck/body of the                            pancreas, measures at least 6cm on this exam, is                            mixed cystic/solid. Preliminary FNA results shows                            very abundant neutophils (abscess). The smaller  lesion is about 4.5cm across, appears mixed                            solid/cystic as well, involves the body/tail of the                            pancreas and was not adequately visualized on this                            exam but is clear on recent CT (however not                            described in CT report).                           - Mass involving the left adrenal was evident and                            it was sampled with a single transgastric FNA.                            Preliminary cytology review suggests atypical                            cells, await final cytology (is this truly a                            metastatic site or is it an incidental, unrelated                            finding such as an adrenal adenoma).                           - He does not have gastric outlet obstruction.   Antimicrobials:  Anti-infectives (From  admission, onward)   Start     Dose/Rate Route Frequency Ordered Stop   06/14/18 1800  ertapenem (INVANZ) 1,000 mg in sodium chloride 0.9 % 100 mL IVPB     1 g 200 mL/hr over 30 Minutes Intravenous Every 24 hours 06/14/18 1553         Objective: Vitals:   06/17/18 0547 06/17/18 1307 06/17/18 2256 06/18/18 0555  BP: 130/84 123/81 131/79 120/85  Pulse: 72 79 72 85  Resp: 16 18 20 20   Temp: 98.6 F (37 C) 98 F (36.7 C) 98.3 F (36.8 C) 98.5 F (36.9 C)  TempSrc: Oral Oral Oral Oral  SpO2: 100% 100% 100% 100%  Weight:      Height:        Intake/Output Summary (Last 24 hours) at 06/18/2018 0948 Last data filed at 06/17/2018 1700 Gross per 24 hour  Intake 600 ml  Output -  Net 600 ml   Filed Weights   06/12/18 0949 06/12/18 2142 06/14/18 0845  Weight: 72.6 kg (160 lb) 69.2 kg (152 lb 8.9 oz) 69.2 kg (152 lb 8.9 oz)    Examination: General exam: Appears calm and comfortable  Respiratory system: Clear to auscultation. Respiratory effort normal. Cardiovascular  system: S1 & S2 heard, RRR. No JVD, murmurs, rubs, gallops or clicks. No pedal edema. Gastrointestinal system: Abdomen is nondistended, soft and nontender. No organomegaly or masses felt. Normal bowel sounds heard. Central nervous system: Alert and oriented. No focal neurological deficits. Extremities: Symmetric 5 x 5 power. Skin: No rashes, lesions or ulcers Psychiatry: Judgement and insight appear normal. Mood & affect appropriate.    Data Reviewed: I have personally reviewed following labs and imaging studies  CBC: Recent Labs  Lab 06/12/18 1142 06/13/18 0356 06/14/18 0431 06/15/18 0429 06/16/18 0344  WBC 14.6* 10.9* 6.2 6.3 6.5  HGB 13.4 11.2* 11.5* 11.2* 11.8*  HCT 38.9* 33.7* 34.7* 32.8* 35.6*  MCV 97.0 98.0 98.3 96.2 97.5  PLT 425* 388 369 334 419   Basic Metabolic Panel: Recent Labs  Lab 06/12/18 1142 06/13/18 0356 06/14/18 0431 06/15/18 0429 06/16/18 0344  NA 137 137 138 139 140  K 4.9  4.6 4.6 4.4 4.5  CL 100 105 107 108 108  CO2 25 25 25 24 25   GLUCOSE 147* 109* 94 118* 123*  BUN 26* 23 12 11 13   CREATININE 1.47* 1.11 0.96 0.92 0.83  CALCIUM 9.4 8.5* 8.7* 8.7* 8.7*   GFR: Estimated Creatinine Clearance: 81.1 mL/min (by C-G formula based on SCr of 0.83 mg/dL). Liver Function Tests: Recent Labs  Lab 06/12/18 1142 06/13/18 0356 06/14/18 0431  AST 22 20 38  ALT 18 15 21   ALKPHOS 94 78 77  BILITOT 0.7 0.6 0.3  PROT 8.2* 6.8 6.7  ALBUMIN 3.1* 2.4* 2.5*   Recent Labs  Lab 06/12/18 1142  LIPASE 22   No results for input(s): AMMONIA in the last 168 hours. Coagulation Profile: Recent Labs  Lab 06/12/18 1552  INR 1.26   Cardiac Enzymes: No results for input(s): CKTOTAL, CKMB, CKMBINDEX, TROPONINI in the last 168 hours. BNP (last 3 results) No results for input(s): PROBNP in the last 8760 hours. HbA1C: No results for input(s): HGBA1C in the last 72 hours. CBG: No results for input(s): GLUCAP in the last 168 hours. Lipid Profile: No results for input(s): CHOL, HDL, LDLCALC, TRIG, CHOLHDL, LDLDIRECT in the last 72 hours. Thyroid Function Tests: No results for input(s): TSH, T4TOTAL, FREET4, T3FREE, THYROIDAB in the last 72 hours. Anemia Panel: No results for input(s): VITAMINB12, FOLATE, FERRITIN, TIBC, IRON, RETICCTPCT in the last 72 hours. Sepsis Labs: No results for input(s): PROCALCITON, LATICACIDVEN in the last 168 hours.  No results found for this or any previous visit (from the past 240 hour(s)).     Radiology Studies: No results found.    Scheduled Meds: . enoxaparin (LOVENOX) injection  40 mg Subcutaneous Q24H  . feeding supplement (ENSURE ENLIVE)  237 mL Oral BID BM   Continuous Infusions: . ertapenem Stopped (06/17/18 1929)     LOS: 6 days    Time spent: 20 minutes   Dessa Phi, DO [facility] [email or web address] Password [facility] 06/18/2018, 9:48 AM

## 2018-06-18 NOTE — Progress Notes (Signed)
Oncology short note  Chart reviewed. Still pending pathology result. Will await pathology results and f/u after this is available.  Sullivan Lone MD MS

## 2018-06-18 NOTE — Progress Notes (Signed)
Pt ambulating in the hallway, tolerating well. Wife at bedside.

## 2018-06-18 NOTE — Progress Notes (Addendum)
    Progress Note   Subjective  Chief Complaint: Abnormal imaging pancreas  Doing well this morning, no new complaints.    Objective   Vital signs in last 24 hours: Temp:  [98.3 F (36.8 C)-99 F (37.2 C)] 99 F (37.2 C) (07/22 1230) Pulse Rate:  [72-86] 86 (07/22 1230) Resp:  [18-20] 18 (07/22 1230) BP: (116-131)/(79-85) 116/84 (07/22 1230) SpO2:  [100 %] 100 % (07/22 1230) Last BM Date: 06/17/18 General:    AA male in NAD Heart:  Regular rate and rhythm; no murmurs Lungs: Respirations even and unlabored, lungs CTA bilaterally Abdomen:  Soft, nontender and nondistended. Normal bowel sounds. Extremities:  Without edema. Neurologic:  Alert and oriented,  grossly normal neurologically. Psych:  Cooperative. Normal mood and affect.  Intake/Output from previous day: 07/21 0701 - 07/22 0700 In: 840 [P.O.:840] Out: -  Intake/Output this shift: Total I/O In: 240 [P.O.:240] Out: -   Lab Results: Recent Labs    06/16/18 0344  WBC 6.5  HGB 11.8*  HCT 35.6*  PLT 337   BMET Recent Labs    06/16/18 0344  NA 140  K 4.5  CL 108  CO2 25  GLUCOSE 123*  BUN 13  CREATININE 0.83  CALCIUM 8.7*     Assessment / Plan:   Assessment: 1. Abnormal CT abdomen concerning for metastatic malignancy: s/p EUS FNA on 06/13/18, cytology from pancreatic abnormality showed abundant neutrophils and acute inflammation per path lab today, cytology still pending from adrenal mass- spoke with pathology today and they expect it to be read by tomorrow morning  Plan: 1. Briefly spoke with patient today and let him know that we expect more answers tomorrow morning.  2. Continue supportive measures  We will continue to follow along.   LOS: 6 days   Levin Erp  06/18/2018, 2:50 PM  Pager # [phone]     Attending physician's note   I have taken an interval history, reviewed the chart and examined the patient. I agree with the Advanced Practitioner's note, impression and  recommendations. Contacted pathology and cytology report is expected tomorrow - patient is aware.    Lucio Edward, MD FACG [phone] office

## 2018-06-18 NOTE — Progress Notes (Signed)
   INFECTIOUS DISEASE PROGRESS NOTE  ID: Anthony Tamburo is a 72 y.o. male with  Principal Problem:   Pancreatic abscess Active Problems:   Essential hypertension   Abdominal pain   Leukocytosis   Pancreatic cancer ([place])   AKI (acute kidney injury) ([place])   Metastatic cancer (HCC)   Abnormal CT of the abdomen   Pancreatic mass   Malnutrition of moderate degree  Subjective: No complaints  Abtx:  Anti-infectives (From admission, onward)   Start     Dose/Rate Route Frequency Ordered Stop   06/14/18 1800  ertapenem (INVANZ) 1,000 mg in sodium chloride 0.9 % 100 mL IVPB     1 g 200 mL/hr over 30 Minutes Intravenous Every 24 hours 06/14/18 1553        Medications:  Scheduled: . enoxaparin (LOVENOX) injection  40 mg Subcutaneous Q24H  . feeding supplement (ENSURE ENLIVE)  237 mL Oral BID BM    Objective: Vital signs in last 24 hours: Temp:  [98.3 F (36.8 C)-99 F (37.2 C)] 99 F (37.2 C) (07/22 1230) Pulse Rate:  [72-86] 86 (07/22 1230) Resp:  [18-20] 18 (07/22 1230) BP: (116-131)/(79-85) 116/84 (07/22 1230) SpO2:  [100 %] 100 % (07/22 1230)   General appearance: alert, cooperative and no distress Resp: clear to auscultation bilaterally Cardio: regular rate and rhythm GI: normal findings: bowel sounds normal and soft, non-tender  Lab Results Recent Labs    06/16/18 0344  WBC 6.5  HGB 11.8*  HCT 35.6*  NA 140  K 4.5  CL 108  CO2 25  BUN 13  CREATININE 0.83   Liver Panel No results for input(s): PROT, ALBUMIN, AST, ALT, ALKPHOS, BILITOT, BILIDIR, IBILI in the last 72 hours. Sedimentation Rate No results for input(s): ESRSEDRATE in the last 72 hours. C-Reactive Protein No results for input(s): CRP in the last 72 hours.  Microbiology: No results found for this or any previous visit (from the past 240 hour(s)).  Studies/Results: No results found.   Assessment/Plan: Pancreatic mass Adrenal mass ? Abscess  ? Malignancy  His pancreatic Bx showed no  malignant cells.  Path from adrenal out tomorrow.  He did not have Cx of fluid.  Would continue his anbx for 3 weeks.  Can change his anbx to augmentin to complete his course when he is ready for d/c.  Available as needed.   Total days of antibiotics: 4 invanz         Bobby Rumpf MD, FACP Infectious Diseases (pager) [phone] [email or web address] 06/18/2018, 5:11 PM  LOS: 6 days

## 2018-06-18 NOTE — Care Management Important Message (Signed)
Important Message  Patient Details  Name: Jeremy Mack MRN: [id number] Date of Birth: 1946-07-02   Medicare Important Message Given:  Yes    Kerin Salen 06/18/2018, 10:56 AMImportant Message  Patient Details  Name: Jeremy Mack MRN: [id number] Date of Birth: Mar 05, 1946   Medicare Important Message Given:  Yes    Kerin Salen 06/18/2018, 10:56 AM

## 2018-06-19 MED ORDER — AMOXICILLIN-POT CLAVULANATE 875-125 MG PO TABS: 1.0000 | ORAL_TABLET | Freq: Two times a day (BID) | ORAL | 0 refills | Status: AC

## 2018-06-19 MED ORDER — AMOXICILLIN-POT CLAVULANATE 875-125 MG PO TABS: 1.0000 | ORAL_TABLET | Freq: Two times a day (BID) | ORAL | 0 refills | Status: DC

## 2018-06-19 NOTE — Progress Notes (Addendum)
    Progress Note   Subjective  Chief Complaint: Abnormal imaging pancreas  Doing well, denies new complaints.     Objective   Vital signs in last 24 hours: Temp:  [98.6 F (37 C)-98.8 F (37.1 C)] 98.6 F (37 C) (07/23 0602) Pulse Rate:  [90-92] 90 (07/23 0602) Resp:  [16-17] 17 (07/23 0602) BP: (122-130)/(80-87) 130/80 (07/23 0602) SpO2:  [99 %-100 %] 99 % (07/23 0602) Last BM Date: 06/17/18 General:    AA male in NAD Heart:  Regular rate and rhythm; no murmurs Lungs: Respirations even and unlabored, lungs CTA bilaterally Abdomen:  Soft, nontender and nondistended. Normal bowel sounds. Extremities:  Without edema. Neurologic:  Alert and oriented,  grossly normal neurologically. Psych:  Cooperative. Normal mood and affect.  Intake/Output from previous day: 07/22 0701 - 07/23 0700 In: 1400 [P.O.:1400] Out: -  Intake/Output this shift: Total I/O In: 350 [P.O.:350] Out: -    Assessment / Plan:     Assessment: 1. Abnormal CT abdomen concerning for metastatic cancer: s/p EUS FNA 06/13/18-cytology from pancreas showing acute inflammation, cytology from adrenal gland showing no malignancy (pathologist explained there was not much in the sample to look at)  Plan: 1. Plan is for repeat EUS with FNA with Dr. Ardis Hughs as outpatient after completing antibiotics for pancreatic abscess, looks like 3 week course per ID. 2. Our office will contact patient once we have arranged for this, this will be per Dr. Ardis Hughs who is out of town this week. Will contact patient next week with appt. 3. Please await any final recommendations from Dr. Fuller Plan later today  Thank you for your kind consultation, we will sign off.   LOS: 7 days   Levin Erp  06/19/2018, 1:01 PM  Pager # [phone]      Attending physician's note   I have taken an interval history, reviewed the chart and examined the patient. I agree with the Advanced Practitioner's note, impression and  recommendations. Final report on pancreatic cytology - acute inflammation. Verbal report on adrenal gland cytology - not much in the sample to review but there was no malignancy. Repeat EUS with FNA after infection resolves, likely 2-3 weeks. Dr. Ardis Hughs will contact the patient next week when he returns regarding timing of repeat EUS. Antibiotic recommendations per ID. GI signing off. Available if needed.   Lucio Edward, MD FACG [phone] office

## 2018-06-19 NOTE — Discharge Summary (Signed)
Physician Discharge Summary  Jeremy Mack VZC:[id number] DOB: Sep 06, 1946 DOA: 06/12/2018  PCP: Golden Circle, FNP  Admit date: 06/12/2018 Discharge date: 06/19/2018  Admitted From: Home Disposition:  Home  Recommendations for Outpatient Follow-up:  1. Follow up with PCP in 1 week 2. Follow up with GI for repeat EUS with FNA with Dr. Ardis Hughs. [facility] will contact him with appointment  3. Follow up with oncology Dr. Irene Limbo once repeat biopsy and pathology available   Discharge Condition: Stable CODE STATUS: Partial code, DNI  Diet recommendation: Regular   Brief/Interim Summary: Jeremy Mack is a 72 y.o.malewith medical history significant of hypertension and GERD who presented to the emergency department complaining of worsening abdominal pain for the past 24 to 48 hours. Symptoms started several weeks ago with nausea and vomiting, associated with weight loss reported about 35 pound and intermittent abdominal pain. Patient saw his primary care provider who performed a CT scan which is concerning for metastatic pancreatic cancer. Now suddenly abdominal pain has worsened, he is not able to tolerate oral intake. No bowel movement since last week. Oncology was consulted in the ED who recommended GI consulted. He underwent EGD/EUS 7/17 which revealed 6 cm pancreatic abscess with very abundant neutrophils, 4.5 cm mixed solid/cystic lesion. Also biopsy of left adrenal.No gastric outlet obstruction seen.   He was treated for pancreatic abscess with Invanz with infectious disease involvement.  His biopsy from pancreas revealed no malignant cells, but did show acute inflammation.  Adrenal gland biopsy showed no malignancy although there was not much sample available.  Patient will follow up with GI as an outpatient after 3 weeks of antibiotics and repeat EUS with FNA.  Discharge Diagnoses:  Principal Problem:   Pancreatic abscess Active Problems:   Essential hypertension   Abdominal pain    Leukocytosis   Pancreatic cancer ([place])   AKI (acute kidney injury) ([place])   Metastatic cancer ([facility])   Abnormal CT of the abdomen   Pancreatic mass   Malnutrition of moderate degree   Pancreatic abscess  -GI consulted -S/p EGD/EUS, found involving the pancreas.  6 cm abscess with very abundant neutrophils, 4.5 cm mixed solid/cystic lesion.  Also biopsy of left adrenal.  No gastric outlet obstruction seen -ID following, was treated with Invanz. Recommend 3 weeks of Augmentin on discharge.   New diagnosis metastatic pancreatic cancer -CT abdomen pelvis completed on 7/10 revealed microcytic neoplasm involving the proximal body of the pancreas, solid mass involving left adrenal gland likely metastatic disease, solid mass involving the spleen, likely occlusion of superior mesenteric vein and splenic vein -S/p EGD/EUS, found involving the pancreas.  6 cm abscess with very abundant neutrophils, 4.5 cm mixed solid/cystic lesion.  Also biopsy of left adrenal.  No gastric outlet obstruction seen -CA19-9 is 1  -Pathology result from pancreas with no malignant cells -Pathology result from adrenal gland with no malignant cells, although sample was small -Oncology consulted, seen by Dr. Irene Limbo  -Will follow up outpatient with GI for repeat EUS/FNA and follow up with oncology once pathology result available   Acute kidney injury -Resolved      Discharge Instructions  Discharge Instructions    Call MD for:  difficulty breathing, headache or visual disturbances   Complete by:  As directed    Call MD for:  extreme fatigue   Complete by:  As directed    Call MD for:  hives   Complete by:  As directed    Call MD for:  persistant dizziness or  light-headedness   Complete by:  As directed    Call MD for:  persistant nausea and vomiting   Complete by:  As directed    Call MD for:  severe uncontrolled pain   Complete by:  As directed    Call MD for:  temperature >100.4   Complete by:  As directed     Diet - low sodium heart healthy   Complete by:  As directed    Discharge instructions   Complete by:  As directed    You were cared for by a hospitalist during your hospital stay. If you have any questions about your discharge medications or the care you received while you were in the hospital after you are discharged, you can call the unit and ask to speak with the hospitalist on call if the hospitalist that took care of you is not available. Once you are discharged, your primary care physician will handle any further medical issues. Please note that NO REFILLS for any discharge medications will be authorized once you are discharged, as it is imperative that you return to your primary care physician (or establish a relationship with a primary care physician if you do not have one) for your aftercare needs so that they can reassess your need for medications and monitor your lab values.   Increase activity slowly   Complete by:  As directed      Allergies as of 06/19/2018   No Known Allergies     Medication List    STOP taking these medications   amLODipine 10 MG tablet Commonly known as:  NORVASC   ciprofloxacin 500 MG tablet Commonly known as:  CIPRO   HYDROcodone-acetaminophen 5-325 MG tablet Commonly known as:  NORCO/VICODIN   lisinopril-hydrochlorothiazide 20-12.5 MG tablet Commonly known as:  PRINZIDE,ZESTORETIC   metroNIDAZOLE 500 MG tablet Commonly known as:  FLAGYL   naproxen 500 MG tablet Commonly known as:  NAPROSYN   VASCEPA 1 g Caps Generic drug:  Icosapent Ethyl     TAKE these medications   amoxicillin-clavulanate 875-125 MG tablet Commonly known as:  AUGMENTIN Take 1 tablet by mouth 2 (two) times daily for 21 days.   naproxen sodium 220 MG tablet Commonly known as:  ALEVE Take 440 mg by mouth 2 (two) times daily as needed (pain).   omeprazole 20 MG capsule Commonly known as:  PRILOSEC Take 1 capsule (20 mg total) by mouth daily. What changed:  when to  take this   pravastatin 80 MG tablet Commonly known as:  PRAVACHOL TAKE 1 TABLET DAILY      Follow-up Information    Golden Circle, FNP. Schedule an appointment as soon as possible for a visit in 1 week(s).   Specialties:  Family Medicine, Infectious Diseases Contact information: [place] [address] [phone]        [facility] Follow up.   Specialty:  Gastroenterology Why:  They will call you next week with appointment for repeat procedure  Contact information: [place] [address] [phone]       Brunetta Genera, MD Follow up.   Specialties:  Hematology, Oncology Why:  You will need to follow up with him once biopsy results are available from repeat biopsy  Contact information: [place] [address] [phone]          No Known Allergies  Consultations:  GI  ID  Oncology    Procedures/Studies: Ct Abdomen Pelvis W  Contrast  Result Date: 06/06/2018 CLINICAL DATA:  Two-month history of generalized abdominal pain, anorexia and urinary frequency. Current history of hypertension and hyperlipidemia. EXAM: CT ABDOMEN AND PELVIS WITH CONTRAST TECHNIQUE: Multidetector CT imaging of the abdomen and pelvis was performed using the standard protocol following bolus administration of intravenous contrast. CONTRAST:  178mL ISOVUE-300 IOPAMIDOL INJECTION 61% IV. Oral contrast was also administered. COMPARISON:  None. FINDINGS: Lower chest: Mild bronchiectasis involving the lower lobes bilaterally. Visualized lung bases clear apart from minimal scarring in the RIGHT MIDDLE LOBE. Heart size upper normal. Hepatobiliary: Geographic low attenuation involving the entire RIGHT lobe with normal enhancement of the LEFT lobe. Anatomic variant in that the LEFT lobe extends well across the midline into the LEFT UPPER QUADRANT. Relative enlargement of the LEFT lobe and caudate lobe with  mildly irregular hepatic contour. No hepatic parenchymal masses. Gallstones within the gallbladder. No evidence of cholecystitis. Mild dilation of the extrahepatic bile duct and the central intrahepatic ducts, maximum common bile duct diameter approximately 11 mm. Pancreas: Large macrocystic mass involving the proximal body of the pancreas measuring approximately 4.9 x 7.3 x 8.8 cm (series 2, image 26 and coronal image 29). Mild edema in the peripancreatic fat. Spleen: Mass involving the LATERAL spleen just below the level of the hilum measuring approximately 2.0 x 1.5 cm (series 2, image 25). Spleen normal in size. Adrenals/Urinary Tract: Enhancing mass involving the LEFT adrenal gland measuring approximately 2.5 x 2.5 cm (series 2, image 24). Cortical cysts involving both kidneys, the largest measuring approximately 2.5 cm arising from the mid RIGHT kidney. No solid renal masses. No hydronephrosis. No urinary tract calculi. Urinary bladder decompressed and unremarkable. Stomach/Bowel: Stomach normal in appearance, though there is extrinsic mass effect on the distal gastric body and antrum due to the pancreatic mass. Normal-appearing small bowel. Scattered diverticula involving the ascending colon and more substantial diverticulosis involving the descending and sigmoid colon without evidence of acute diverticulitis. Appendix not clearly visible, but no evidence of periappendiceal inflammation. Vascular/Lymphatic: Severe aorto-iliofemoral atherosclerosis. BILATERAL common iliac artery aneurysms measuring 1.7 cm on the RIGHT and 1.9 cm on the LEFT. Occlusion of the RIGHT superficial femoral artery at its origin. The mass surrounds the celiac artery at its bifurcation and also surrounds the proximal LEFT gastric artery and the proper hepatic artery. Narrowing of the LEFT renal vein as it courses between the aorta and the SUPERIOR mesenteric artery. The SUPERIOR mesenteric vein and the splenic vein are likely occluded  by the pancreatic mass. The portal vein is patent. A large collateral vein is present in the LEFT UPPER QUADRANT emanating from the spleen and extending across the midline into the RIGHT UPPER QUADRANT to ultimately empty into the portal vein. Mildly enlarged lymph nodes in the gastrohepatic ligament, the porta hepatis, the portacaval region, the retrocaval region of the retroperitoneum and the LEFT periaortic region of the retroperitoneum, indicating locoregional lymphadenopathy related to the pancreatic mass. No pathologic lymphadenopathy elsewhere. Reproductive: Moderately enlarged prostate gland with heterogeneous nodular enhancement, the most prominent enhancing nodule in the median lobe. Normal-appearing seminal vesicles. Other: Midline abdominal surgical scar. No evidence of incisional hernia. Musculoskeletal: DISH involving the LOWER thoracic and UPPER lumbar spine. Facet degenerative changes at L3-4 and L4-5. Moderate multifactorial spinal stenosis at L2-3, severe multifactorial spinal stenosis at L3-4 and L4-5. Ankylosis of the sacroiliac joints. Degenerative changes involving both hip joints. No acute findings. IMPRESSION: 1. Macrocystic neoplasm involving the proximal body of the pancreas with measurements given above. 2. Likely metastatic  locoregional lymphadenopathy as detailed above. 3. Solid mass involving the LEFT adrenal gland, likely metastatic disease. 4. Solid mass involving the spleen, possibly representing metastatic disease. 5. Likely occlusion of the SUPERIOR mesenteric vein and the splenic vein due to the mass. The mass surrounds the celiac artery at its bifurcation and surrounds the LEFT gastric artery and the proper hepatic artery. 6. Mild intra and extrahepatic biliary ductal dilation. 7. Hepatic cirrhosis is suspected. Geographic steatosis involving the RIGHT lobe of the liver. No hepatic parenchymal masses. 8. Diffuse colonic diverticulosis without evidence of acute diverticulitis. 9.  Moderate prostate gland enlargement with heterogeneous nodular enhancement. The largest enhancing nodule is in the median lobe. 10. BILATERAL common iliac artery aneurysms, 1.7 cm on the RIGHT and 1.9 cm on the LEFT. 11. Occlusion of the RIGHT superficial femoral artery at its origin. 12.  Aortic Atherosclerosis (ICD10-170.0) Electronically Signed   By: Evangeline Dakin M.D.   On: 06/06/2018 17:09   Dg Abdomen Acute W/chest  Result Date: 06/12/2018 CLINICAL DATA:  Abdominal pain and vomiting EXAM: DG ABDOMEN ACUTE W/ 1V CHEST COMPARISON:  CT abdomen and pelvis 06/06/2018 FINDINGS: Normal heart size, mediastinal contours, and pulmonary vascularity. Lungs clear. No pleural effusion or pneumothorax. Retained contrast in small bowel and colon from recent CT. No bowel dilatation, bowel wall thickening, or free air. Bones unremarkable. IMPRESSION: No acute abnormalities. Electronically Signed   By: Lavonia Dana M.D.   On: 06/12/2018 18:21       Discharge Exam: Vitals:   06/18/18 2117 06/19/18 0602  BP: 122/87 130/80  Pulse: 92 90  Resp: 16 17  Temp: 98.8 F (37.1 C) 98.6 F (37 C)  SpO2: 100% 99%    General: Pt is alert, awake, not in acute distress Cardiovascular: RRR, S1/S2 +, no rubs, no gallops Respiratory: CTA bilaterally, no wheezing, no rhonchi Abdominal: Soft, NT, ND, bowel sounds + Extremities: no edema, no cyanosis    The results of significant diagnostics from this hospitalization (including imaging, microbiology, ancillary and laboratory) are listed below for reference.     Microbiology: No results found for this or any previous visit (from the past 240 hour(s)).   Labs: BNP (last 3 results) No results for input(s): BNP in the last 8760 hours. Basic Metabolic Panel: Recent Labs  Lab 06/13/18 0356 06/14/18 0431 06/15/18 0429 06/16/18 0344  NA 137 138 139 140  K 4.6 4.6 4.4 4.5  CL 105 107 108 108  CO2 25 25 24 25   GLUCOSE 109* 94 118* 123*  BUN 23 12 11 13    CREATININE 1.11 0.96 0.92 0.83  CALCIUM 8.5* 8.7* 8.7* 8.7*   Liver Function Tests: Recent Labs  Lab 06/13/18 0356 06/14/18 0431  AST 20 38  ALT 15 21  ALKPHOS 78 77  BILITOT 0.6 0.3  PROT 6.8 6.7  ALBUMIN 2.4* 2.5*   No results for input(s): LIPASE, AMYLASE in the last 168 hours. No results for input(s): AMMONIA in the last 168 hours. CBC: Recent Labs  Lab 06/13/18 0356 06/14/18 0431 06/15/18 0429 06/16/18 0344  WBC 10.9* 6.2 6.3 6.5  HGB 11.2* 11.5* 11.2* 11.8*  HCT 33.7* 34.7* 32.8* 35.6*  MCV 98.0 98.3 96.2 97.5  PLT 388 369 334 337   Cardiac Enzymes: No results for input(s): CKTOTAL, CKMB, CKMBINDEX, TROPONINI in the last 168 hours. BNP: Invalid input(s): POCBNP CBG: No results for input(s): GLUCAP in the last 168 hours. D-Dimer No results for input(s): DDIMER in the last 72 hours. Hgb A1c No  results for input(s): HGBA1C in the last 72 hours. Lipid Profile No results for input(s): CHOL, HDL, LDLCALC, TRIG, CHOLHDL, LDLDIRECT in the last 72 hours. Thyroid function studies No results for input(s): TSH, T4TOTAL, T3FREE, THYROIDAB in the last 72 hours.  Invalid input(s): FREET3 Anemia work up No results for input(s): VITAMINB12, FOLATE, FERRITIN, TIBC, IRON, RETICCTPCT in the last 72 hours. Urinalysis    Component Value Date/Time   COLORURINE YELLOW 06/12/2018 1152   APPEARANCEUR CLEAR 06/12/2018 1152   LABSPEC 1.023 06/12/2018 1152   PHURINE 5.0 06/12/2018 1152   GLUCOSEU NEGATIVE 06/12/2018 1152   HGBUR NEGATIVE 06/12/2018 1152   [place] 06/12/2018 1152   KETONESUR NEGATIVE 06/12/2018 1152   PROTEINUR 30 (A) 06/12/2018 1152   NITRITE NEGATIVE 06/12/2018 1152   LEUKOCYTESUR TRACE (A) 06/12/2018 1152   Sepsis Labs Invalid input(s): PROCALCITONIN,  WBC,  LACTICIDVEN Microbiology No results found for this or any previous visit (from the past 240 hour(s)).   Patient was seen and examined on the day of discharge and was found to be in  stable condition. Time coordinating discharge: 25 minutes including assessment and coordination of care, as well as examination of the patient.   SIGNED:  Dessa Phi, DO [facility] Pager [phone]  If 7PM-7AM, please contact night-coverage [email or web address] Password [email or web address] 06/19/2018, 1:42 PM

## 2018-06-20 ENCOUNTER — Encounter: Payer: Self-pay | Admitting: Hematology

## 2018-06-20 ENCOUNTER — Telehealth: Payer: Self-pay | Admitting: Hematology

## 2018-06-20 NOTE — Telephone Encounter (Signed)
A hospital follow up appt has been scheduled for the pt to see Dr. Irene Limbo on 8/8 at 3pm. Pt aware to arrive 30 minutes early for labs at 230pm. Mr. Jeremy Mack has agreed to the appt date and time. Letter mailed.

## 2018-06-26 ENCOUNTER — Telehealth: Payer: Self-pay

## 2018-06-26 ENCOUNTER — Other Ambulatory Visit: Payer: Self-pay

## 2018-06-26 DIAGNOSIS — K859 Acute pancreatitis without necrosis or infection, unspecified: Secondary | ICD-10-CM

## 2018-06-26 NOTE — Telephone Encounter (Signed)
-----   Message from [name], MD sent at 06/26/2018  7:42 AM EDT ----- Regarding: RE: Needs repeat EUS with FNA Anderson [place], thanks  [place], I would like to do his repeat upper EUS on Thursday August 8th  (the 8th is really optimal in terms of his underlying disease, infection).  Can you please bump 1 or 2 of the case that are already scheduled for August 8 to make room for him?  I may be able to accommodate the bumped patient(s) this Thursday, August 1st if they can do it.  Mr. Manuele also needs a CT scan on Monday, August 5 abdomen and pelvis with IV and oral contrast to re-evaluate his pancreatic infection, question underlying tumors.  Thanks    ----- Message ----- From: Levin Erp, PA Sent: 06/19/2018   1:14 PM To: [name], MD Subject: Needs repeat EUS with FNA                      Hello,    Per your note pt needs repeat EUS +FNA if bx unrevealing after abx. They were unrevealing. Per ID set for abx for 3 weeks looks like- figured I would leave timing of repeat for you to decide.  We are signing off in hospital today. Pt aware that we will contact him once we have appt arranged.  Ellouise Newer, PA-C

## 2018-06-26 NOTE — Telephone Encounter (Signed)
EUS scheduled, pt instructed and medications reviewed.  Patient instructions mailed to home.  Patient to call with any questions or concerns.  CT scan instructions also mailed to the pt and instructed over the phone.       You have been scheduled for a CT scan of the abdomen and pelvis at [facility] ([address]---this is in the same building as [facility]).   You are scheduled on 07/02/18 at 3 pm. You should arrive 30 minutes prior to your appointment time for registration. Please follow the written instructions below on the day of your exam:  WARNING: IF YOU ARE ALLERGIC TO IODINE/X-RAY DYE, PLEASE NOTIFY RADIOLOGY IMMEDIATELY AT [phone]! YOU WILL BE GIVEN A 13 HOUR PREMEDICATION PREP.  1) Do not eat or drink anything after 11 am (4 hours prior to your test)  You may take any medications as prescribed with a small amount of water except for the following: Metformin, Glucophage, Glucovance, Avandamet, Riomet, Fortamet, Actoplus Met, Janumet, Glumetza or Metaglip. The above medications must be held the day of the exam AND 48 hours after the exam.   This test typically takes 30-45 minutes to complete.  If you have any questions regarding your exam or if you need to reschedule, you may call the CT department at [phone] between the hours of 8:00 am and 5:00 pm, Monday-Friday.  _____________________________________________________

## 2018-07-02 ENCOUNTER — Inpatient Hospital Stay: Admission: RE | Admit: 2018-07-02 | Payer: Medicare HMO | Source: Ambulatory Visit

## 2018-07-04 ENCOUNTER — Other Ambulatory Visit: Payer: Self-pay

## 2018-07-04 ENCOUNTER — Ambulatory Visit (HOSPITAL_COMMUNITY): Payer: Medicare HMO

## 2018-07-04 ENCOUNTER — Telehealth: Payer: Self-pay

## 2018-07-04 DIAGNOSIS — K859 Acute pancreatitis without necrosis or infection, unspecified: Secondary | ICD-10-CM

## 2018-07-04 DIAGNOSIS — C258 Malignant neoplasm of overlapping sites of pancreas: Secondary | ICD-10-CM

## 2018-07-04 NOTE — Progress Notes (Signed)
The pt was scheduled for a CT at [facility] on 8/5, he did not show for that appt and was rescheduled for 8/14.  However, the CT scan needed to be completed before the EUS on 8/8 per Dr Ardis Hughs.  I called and had a CT scan scheduled for TODAY at [facility] and the pt declined appt.  Per Dr Ardis Hughs the EUS will need to be moved to 07/19/18.  The CT will remain on for 8/14.

## 2018-07-04 NOTE — Telephone Encounter (Signed)
The pt was scheduled for a CT at [facility] on 8/5, he did not show for that appt and was rescheduled for 8/14.  However, the CT scan needed to be completed before the EUS on 8/8 per Dr Ardis Hughs.  I called and had a CT scan scheduled for TODAY at [facility] and the pt declined appt.  Per Dr Ardis Hughs the EUS will need to be moved to 07/19/18.  The CT will remain on for 8/14.

## 2018-07-05 ENCOUNTER — Inpatient Hospital Stay: Payer: Medicare HMO | Attending: Hematology | Admitting: Hematology

## 2018-07-05 ENCOUNTER — Inpatient Hospital Stay: Payer: Medicare HMO

## 2018-07-05 NOTE — Telephone Encounter (Signed)
EUS changed to 07/19/18 930 am called to inform the pt but there was no answer and no voice mail

## 2018-07-06 NOTE — Telephone Encounter (Signed)
EUS scheduled, pt instructed and medications reviewed.  Patient instructions mailed to home.  Patient to call with any questions or concerns.  The pt was also advised he MUST keep appt for CT scan on 8/14.    The pt has been advised of the information and verbalized understanding.

## 2018-07-11 ENCOUNTER — Ambulatory Visit (INDEPENDENT_AMBULATORY_CARE_PROVIDER_SITE_OTHER)
Admission: RE | Admit: 2018-07-11 | Discharge: 2018-07-11 | Disposition: A | Discharge status: Home / Self Care | Payer: Medicare HMO | Source: Ambulatory Visit | Attending: Gastroenterology | Admitting: Gastroenterology

## 2018-07-11 DIAGNOSIS — K859 Acute pancreatitis without necrosis or infection, unspecified: Secondary | ICD-10-CM | POA: Diagnosis not present

## 2018-07-11 MED ORDER — IOPAMIDOL (ISOVUE-300) INJECTION 61%
100.0000 mL | Freq: Once | INTRAVENOUS | Status: AC | PRN
  Administered 2018-07-11: 100 mL via INTRAVENOUS

## 2018-07-16 ENCOUNTER — Other Ambulatory Visit: Payer: Self-pay

## 2018-07-16 MED ORDER — AMOXICILLIN-POT CLAVULANATE 875-125 MG PO TABS: 1.0000 | ORAL_TABLET | Freq: Two times a day (BID) | ORAL | 0 refills | Status: AC

## 2018-07-19 ENCOUNTER — Ambulatory Visit: Payer: Medicare HMO | Admitting: Gastroenterology

## 2018-07-19 ENCOUNTER — Ambulatory Visit (HOSPITAL_COMMUNITY): Admission: RE | Admit: 2018-07-19 | Payer: Medicare HMO | Source: Ambulatory Visit | Admitting: Gastroenterology

## 2018-07-19 ENCOUNTER — Encounter (HOSPITAL_COMMUNITY): Admission: RE | Payer: Self-pay | Source: Ambulatory Visit

## 2018-07-19 SURGERY — UPPER ENDOSCOPIC ULTRASOUND (EUS) RADIAL
Anesthesia: Monitor Anesthesia Care

## 2018-07-20 ENCOUNTER — Encounter: Payer: Self-pay | Admitting: Gastroenterology

## 2018-07-20 ENCOUNTER — Ambulatory Visit: Payer: Medicare HMO | Admitting: Gastroenterology

## 2018-07-20 VITALS — BP 126/70 | HR 86 | Ht 71.0 in | Wt 163.0 lb

## 2018-07-20 DIAGNOSIS — K859 Acute pancreatitis without necrosis or infection, unspecified: Secondary | ICD-10-CM

## 2018-07-20 NOTE — Patient Instructions (Addendum)
You must stop drinking alcohol, you may die if you resume it seriously.  Please return to see Dr. Ardis Hughs in 2 months. On 09/19/18 at 330pm  Complete the antibioitics.  You will have labs checked today in the basement lab.  Please head down after you check out with the front desk  (cbc, cmet, inr).

## 2018-07-20 NOTE — Progress Notes (Signed)
Review of pertinent gastrointestinal problems: 1. Alcoholism 2. Acute pancreatic injury: Presented July 2019 with abdominal pains.  Imaging including a CT scan showed very abnormal pancreas with fluid collections, question masses, also adrenal mass.  There was radiographic concern of widespread abdominal malignancy.  CA-19-9 was 1.  I performed endoscopic ultrasound July 2019 and aspirated what appeared to be purulence from the peripancreatic, pancreatic fluid collection.  I also biopsied his left adrenal given the radiographic concern.  The adrenal showed atypical cells suspicious for neoplasm.  The pancreas biopsy suggested abscess.  He was put on IV and then oral antibiotics under the direction of infectious disease expert.  Interval CT scan about 1 month later showed near complete resolution of the pancreatic, peripancreatic abnormalities.  There was still some slight abnormality in the pancreas and so a second 3-week course of Augmentin was initiated   HPI: This is a very pleasant 72 year old man whom I last saw when I performed endoscopic ultrasound for him while he was hospitalized in July.  Chief complaint is abnormal pancreas  Feeling well, much better since d/c from hospital.  He's not gained weight but he's eating much better.   Pain is nearly completely gone. Does have some back pain.  He is about 1 week into his second 3-week course of Augmentin.  Admits now to abusing alcohol chronically.  His wife agrees.   Drinks fifth of vodka, last about 1-2 days.  4-5 bottles per week.  His wife says he cannot stop drinking, he has never tried.  He thinks he probably can stop if he puts his mind to it.   Ca 19-9 05/2018 was 1   Pancreatic FNA via EUS Diagnosis FINE NEEDLE ASPIRATION, ENDOSCOPIC, PANCREAS (SPECIMEN 1 OF 2 COLLECTED 06/14/18): - NO MALIGNANT CELLS IDENTIFIED - ACUTE INFLAMMATION Preliminary Diagnosis Intraoperative Diagnosis: ABUNDANT NEUTROPHILS. ADDITIONAL MATERIAL  REQUESTED. (DLB)   Adrenal FNA via EUS; Diagnosis FINE NEEDLE ASPIRATION, ENDOSCOPIC, LEFT ADRENAL MASS(SPECIMEN 2 OF 2 COLLECTED 06/14/18): - EPITHELIAL CELLS PRESENT - SEE COMMENT Preliminary Diagnosis Intraoperative Diagnosis: ATYPICAL CELLS PRESENT CONCERNING FOR NEOPLASM. (DLB)  CT 07/11/2018:  1. Significantly improved appearance of the pancreas and peripancreatic tissues. Improved to resolved peripancreatic edema with resolved and significantly improved peripancreatic lesions. The remaining lesion was felt to represent abscess on prior endoscopic ultrasound sampling. Pseudocyst or infected pseudocyst would be the favored imaging diagnosis. 2. Persistent but improved mass effect upon the inferior aspect of the portal vein and superior aspect of the superior mesenteric vein. 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 3. Aortic Atherosclerosis (ICD10-I70.0). Bilateral common iliac artery ectasia. 4. A left adrenal nodule is indeterminate on precontrast imaging and may have decreased in size since the prior. Consider dedicated adrenal protocol CT. This could either be performed in 6 months to confirm size stability or more acutely, depending on clinical concern. 5. A splenic lesion is indeterminate and can be re-evaluated on follow-up. 6. Hepatic morphology suspicious for cirrhosis. 7. Cholelithiasis. 8.  Emphysema (ICD10-J43.9).  ROS: complete GI ROS as described in HPI, all other review negative.  Constitutional:  No unintentional weight loss   Past Medical History:  Diagnosis Date  . Cancer ([place])   . GERD (gastroesophageal reflux disease)   . Hyperlipemia   . Hypertension     Past Surgical History:  Procedure Laterality Date  . arm fracture surgery Right   . colon polyps removed    . ESOPHAGOGASTRODUODENOSCOPY (EGD) WITH PROPOFOL N/A 06/14/2018   Procedure: ESOPHAGOGASTRODUODENOSCOPY (EGD) WITH PROPOFOL;  Surgeon: [name], MD;   Location: [facility];  Service: Endoscopy;  Laterality: N/A;  . EUS N/A 06/14/2018   Procedure: ESOPHAGEAL ENDOSCOPIC ULTRASOUND (EUS) RADIAL;  Surgeon: [name], MD;  Location: [facility];  Service: Endoscopy;  Laterality: N/A;  . FINE NEEDLE ASPIRATION N/A 06/14/2018   Procedure: FINE NEEDLE ASPIRATION (FNA) LINEAR;  Surgeon: [name], MD;  Location: [facility];  Service: Endoscopy;  Laterality: N/A;    Current Outpatient Medications  Medication Sig Dispense Refill  . amoxicillin-clavulanate (AUGMENTIN) 875-125 MG tablet Take 1 tablet by mouth 2 (two) times daily for 21 days. 42 tablet 0  . naproxen sodium (ALEVE) 220 MG tablet Take 440 mg by mouth 2 (two) times daily as needed (pain).     No current facility-administered medications for this visit.     Allergies as of 07/20/2018  . (No Known Allergies)    Family History  Problem Relation Age of Onset  . Diabetes Mother   . Diabetes Maternal Grandmother     Social History   Socioeconomic History  . Marital status: Single    Spouse name: Not on file  . Number of children: 4  . Years of education: 64  . Highest education level: Not on file  Occupational History  . Occupation: Retired  Scientific laboratory technician  . Financial resource strain: Not on file  . Food insecurity:    Worry: Not on file    Inability: Not on file  . Transportation needs:    Medical: Not on file    Non-medical: Not on file  Tobacco Use  . Smoking status: Current Every Day Smoker    Packs/day: 0.50    Types: Cigars  . Smokeless tobacco: Never Used  Substance and Sexual Activity  . Alcohol use: Yes    Alcohol/week: 0.0 standard drinks    Comment: Occasionally  . Drug use: No  . Sexual activity: Not on file  Lifestyle  . Physical activity:    Days per week: Not on file    Minutes per session: Not on file  . Stress: Not on file  Relationships  . Social connections:    Talks on phone: Not on file    Gets together: Not on file     Attends religious service: Not on file    Active member of club or organization: Not on file    Attends meetings of clubs or organizations: Not on file    Relationship status: Not on file  . Intimate partner violence:    Fear of current or ex partner: Not on file    Emotionally abused: Not on file    Physically abused: Not on file    Forced sexual activity: Not on file  Other Topics Concern  . Not on file  Social History Narrative   Fun: Swimming, and outdoor sports   Denies religious beliefs effecting health care.      Physical Exam: BP 126/70   Pulse 86   Ht 5' 11"  (1.803 m)   Wt 163 lb (73.9 kg)   BMI 22.73 kg/m  Constitutional: generally well-appearing Psychiatric: alert and oriented x3 Abdomen: soft, nontender, nondistended, no obvious ascites, no peritoneal signs, normal bowel sounds No peripheral edema noted in lower extremities  Assessment and plan: 72 y.o. male with improving pancreatic abnormalities  When I first met him during his hospitalization 5 or 6 weeks ago I was quite concerned about the possibility of underlying cancer, possibly causing acute pancreatitis and peripancreatic fluid  collections.  Turned out he had pancreatic and infection, abscess.  Repeat imaging has shown significant improvement in his pancreas overall.  Today he is admitting and his wife confirms that he is quite an alcohol abuser.  He drinks about 4 or 5 bottles of vodka weekly.  That certainly explains the situation a bit better.  I made it quite clear to him that he should never resume heavy drinking again.  He stopped completely for 2 weeks after the hospital admission but then resumed in the past week or so.  I told him he needs to quit completely.  I told him he may die if he resumes seriously drinking.  I will order a battery of blood test today including a CBC, complete med about profile, coags.  He will return to see me in 2 months to discuss this further.  He might have underlying  cirrhosis based on imaging and if appropriate we will start addressing it at that point.  He might need repeat cross-sectional imaging to make sure that the peripancreatic, pancreatic issues have resolved by imaging.  We will see how he does clinically  Please see the "Patient Instructions" section for addition details about the plan.  Owens Loffler, MD [facility] 07/20/2018, 2:16 PM

## 2018-07-23 ENCOUNTER — Other Ambulatory Visit (INDEPENDENT_AMBULATORY_CARE_PROVIDER_SITE_OTHER): Payer: Medicare HMO

## 2018-07-23 DIAGNOSIS — K859 Acute pancreatitis without necrosis or infection, unspecified: Secondary | ICD-10-CM | POA: Diagnosis not present

## 2018-07-23 LAB — CBC WITH DIFFERENTIAL/PLATELET
Basophils Absolute: 0.1 10*3/uL (ref 0.0–0.1)
Basophils Relative: 0.7 % (ref 0.0–3.0)
EOS PCT: 0.4 % (ref 0.0–5.0)
Eosinophils Absolute: 0 10*3/uL (ref 0.0–0.7)
HCT: 42.2 % (ref 39.0–52.0)
Hemoglobin: 14.4 g/dL (ref 13.0–17.0)
LYMPHS ABS: 3.1 10*3/uL (ref 0.7–4.0)
Lymphocytes Relative: 32.6 % (ref 12.0–46.0)
MCHC: 34 g/dL (ref 30.0–36.0)
MCV: 100.8 fl — AB (ref 78.0–100.0)
MONOS PCT: 7.5 % (ref 3.0–12.0)
Monocytes Absolute: 0.7 10*3/uL (ref 0.1–1.0)
NEUTROS ABS: 5.5 10*3/uL (ref 1.4–7.7)
NEUTROS PCT: 58.8 % (ref 43.0–77.0)
PLATELETS: 189 10*3/uL (ref 150.0–400.0)
RBC: 4.19 Mil/uL — ABNORMAL LOW (ref 4.22–5.81)
RDW: 20.7 % — AB (ref 11.5–15.5)
WBC: 9.4 10*3/uL (ref 4.0–10.5)

## 2018-07-23 LAB — COMPREHENSIVE METABOLIC PANEL
ALT: 9 U/L (ref 0–53)
AST: 11 U/L (ref 0–37)
Albumin: 4 g/dL (ref 3.5–5.2)
Alkaline Phosphatase: 110 U/L (ref 39–117)
BUN: 9 mg/dL (ref 6–23)
CO2: 25 meq/L (ref 19–32)
Calcium: 9.5 mg/dL (ref 8.4–10.5)
Chloride: 104 mEq/L (ref 96–112)
Creatinine, Ser: 1.17 mg/dL (ref 0.40–1.50)
GFR: 79.23 mL/min (ref >60.00)
GLUCOSE: 98 mg/dL (ref 70–99)
POTASSIUM: 3.8 meq/L (ref 3.5–5.1)
SODIUM: 138 meq/L (ref 135–145)
Total Bilirubin: 1.1 mg/dL (ref 0.2–1.2)
Total Protein: 7.9 g/dL (ref 6.0–8.3)

## 2018-07-23 LAB — PROTIME-INR
INR: 1.2 ratio — AB (ref 0.8–1.0)
PROTHROMBIN TIME: 13.7 s — AB (ref 9.6–13.1)

## 2018-09-18 ENCOUNTER — Other Ambulatory Visit: Payer: Self-pay | Admitting: Family Medicine

## 2018-09-19 ENCOUNTER — Ambulatory Visit: Payer: Medicare HMO | Admitting: Gastroenterology

## 2019-03-01 ENCOUNTER — Ambulatory Visit: Payer: Medicare HMO | Admitting: Family

## 2019-04-26 ENCOUNTER — Encounter: Payer: Self-pay | Admitting: Family

## 2019-04-26 ENCOUNTER — Other Ambulatory Visit: Payer: Self-pay

## 2019-04-26 ENCOUNTER — Other Ambulatory Visit (INDEPENDENT_AMBULATORY_CARE_PROVIDER_SITE_OTHER): Payer: Medicare PPO

## 2019-04-26 ENCOUNTER — Other Ambulatory Visit: Payer: Self-pay | Admitting: Family

## 2019-04-26 ENCOUNTER — Ambulatory Visit: Payer: Medicare HMO | Admitting: Family

## 2019-04-26 ENCOUNTER — Ambulatory Visit (INDEPENDENT_AMBULATORY_CARE_PROVIDER_SITE_OTHER): Payer: Medicare PPO | Admitting: Family

## 2019-04-26 VITALS — BP 170/100 | HR 99 | Temp 98.2°F | Ht 71.0 in | Wt 178.4 lb

## 2019-04-26 DIAGNOSIS — Z0000 Encounter for general adult medical examination without abnormal findings: Secondary | ICD-10-CM

## 2019-04-26 DIAGNOSIS — Z125 Encounter for screening for malignant neoplasm of prostate: Secondary | ICD-10-CM | POA: Diagnosis not present

## 2019-04-26 DIAGNOSIS — K8689 Other specified diseases of pancreas: Secondary | ICD-10-CM

## 2019-04-26 DIAGNOSIS — I10 Essential (primary) hypertension: Secondary | ICD-10-CM

## 2019-04-26 DIAGNOSIS — Z720 Tobacco use: Secondary | ICD-10-CM

## 2019-04-26 DIAGNOSIS — Z13220 Encounter for screening for lipoid disorders: Secondary | ICD-10-CM | POA: Diagnosis not present

## 2019-04-26 DIAGNOSIS — R7309 Other abnormal glucose: Secondary | ICD-10-CM

## 2019-04-26 DIAGNOSIS — R972 Elevated prostate specific antigen [PSA]: Secondary | ICD-10-CM

## 2019-04-26 DIAGNOSIS — K859 Acute pancreatitis without necrosis or infection, unspecified: Secondary | ICD-10-CM

## 2019-04-26 LAB — LIPID PANEL
Cholesterol: 192 mg/dL (ref 0–200)
HDL: 42.8 mg/dL (ref >39.00)
LDL Cholesterol: 127 mg/dL — ABNORMAL HIGH (ref 0–99)
NonHDL: 149.55
Total CHOL/HDL Ratio: 4
Triglycerides: 113 mg/dL (ref 0.0–149.0)
VLDL: 22.6 mg/dL (ref 0.0–40.0)

## 2019-04-26 LAB — COMPREHENSIVE METABOLIC PANEL
ALT: 24 U/L (ref 0–53)
AST: 26 U/L (ref 0–37)
Albumin: 4.2 g/dL (ref 3.5–5.2)
Alkaline Phosphatase: 174 U/L — ABNORMAL HIGH (ref 39–117)
BUN: 10 mg/dL (ref 6–23)
CO2: 28 mEq/L (ref 19–32)
Calcium: 9.5 mg/dL (ref 8.4–10.5)
Chloride: 107 mEq/L (ref 96–112)
Creatinine, Ser: 1.09 mg/dL (ref 0.40–1.50)
GFR: 80.72 mL/min (ref >60.00)
Glucose, Bld: 111 mg/dL — ABNORMAL HIGH (ref 70–99)
Potassium: 3.9 mEq/L (ref 3.5–5.1)
Sodium: 145 mEq/L (ref 135–145)
Total Bilirubin: 0.7 mg/dL (ref 0.2–1.2)
Total Protein: 7.5 g/dL (ref 6.0–8.3)

## 2019-04-26 LAB — CBC WITH DIFFERENTIAL/PLATELET
Basophils Absolute: 0.1 10*3/uL (ref 0.0–0.1)
Basophils Relative: 0.9 % (ref 0.0–3.0)
Eosinophils Absolute: 0.1 10*3/uL (ref 0.0–0.7)
Eosinophils Relative: 1.1 % (ref 0.0–5.0)
HCT: 48 % (ref 39.0–52.0)
Hemoglobin: 17 g/dL (ref 13.0–17.0)
Lymphocytes Relative: 40.2 % (ref 12.0–46.0)
Lymphs Abs: 2.2 10*3/uL (ref 0.7–4.0)
MCHC: 35.4 g/dL (ref 30.0–36.0)
MCV: 99 fl (ref 78.0–100.0)
Monocytes Absolute: 0.6 10*3/uL (ref 0.1–1.0)
Monocytes Relative: 11.6 % (ref 3.0–12.0)
Neutro Abs: 2.5 10*3/uL (ref 1.4–7.7)
Neutrophils Relative %: 46.2 % (ref 43.0–77.0)
Platelets: 165 10*3/uL (ref 150.0–400.0)
RBC: 4.85 Mil/uL (ref 4.22–5.81)
RDW: 14.7 % (ref 11.5–15.5)
WBC: 5.5 10*3/uL (ref 4.0–10.5)

## 2019-04-26 LAB — PSA, MEDICARE: PSA: 9.47 ng/ml — ABNORMAL HIGH (ref 0.10–4.00)

## 2019-04-26 LAB — LIPASE: Lipase: 99 U/L — ABNORMAL HIGH (ref 11.0–59.0)

## 2019-04-26 LAB — AMYLASE: Amylase: 88 U/L (ref 27–131)

## 2019-04-26 LAB — HEMOGLOBIN A1C: Hgb A1c MFr Bld: 5.1 % (ref 4.6–6.5)

## 2019-04-26 MED ORDER — AMLODIPINE BESYLATE 5 MG PO TABS: 5.0000 mg | ORAL_TABLET | Freq: Every day | ORAL | 0 refills | Status: DC

## 2019-04-26 NOTE — Patient Instructions (Addendum)
Steps to Quit Smoking    Smoking tobacco can be bad for your health. It can also affect almost every organ in your body. Smoking puts you and people around you at risk for many serious long-lasting (chronic) diseases. Quitting smoking is hard, but it is one of the best things that you can do for your health. It is never too late to quit.  What are the benefits of quitting smoking?  When you quit smoking, you lower your risk for getting serious diseases and conditions. They can include:  · Lung cancer or lung disease.  · Heart disease.  · Stroke.  · Heart attack.  · Not being able to have children (infertility).  · Weak bones (osteoporosis) and broken bones (fractures).  If you have coughing, wheezing, and shortness of breath, those symptoms may get better when you quit. You may also get sick less often. If you are pregnant, quitting smoking can help to lower your chances of having a baby of low birth weight.  What can I do to help me quit smoking?  Talk with your doctor about what can help you quit smoking. Some things you can do (strategies) include:  · Quitting smoking totally, instead of slowly cutting back how much you smoke over a period of time.  · Going to in-person counseling. You are more likely to quit if you go to many counseling sessions.  · Using resources and support systems, such as:  ? Online chats with a counselor.  ? Phone quitlines.  ? Printed self-help materials.  ? Support groups or group counseling.  ? Text messaging programs.  ? Mobile phone apps or applications.  · Taking medicines. Some of these medicines may have nicotine in them. If you are pregnant or breastfeeding, do not take any medicines to quit smoking unless your doctor says it is okay. Talk with your doctor about counseling or other things that can help you.  Talk with your doctor about using more than one strategy at the same time, such as taking medicines while you are also going to in-person counseling. This can help make  quitting easier.  What things can I do to make it easier to quit?  Quitting smoking might feel very hard at first, but there is a lot that you can do to make it easier. Take these steps:  · Talk to your family and friends. Ask them to support and encourage you.  · Call phone quitlines, reach out to support groups, or work with a counselor.  · Ask people who smoke to not smoke around you.  · Avoid places that make you want (trigger) to smoke, such as:  ? Bars.  ? Parties.  ? Smoke-break areas at work.  · Spend time with people who do not smoke.  · Lower the stress in your life. Stress can make you want to smoke. Try these things to help your stress:  ? Getting regular exercise.  ? Deep-breathing exercises.  ? Yoga.  ? Meditating.  ? Doing a body scan. To do this, close your eyes, focus on one area of your body at a time from head to toe, and notice which parts of your body are tense. Try to relax the muscles in those areas.  · Download or buy apps on your mobile phone or tablet that can help you stick to your quit plan. There are many free apps, such as QuitGuide from the CDC (Centers for Disease Control and Prevention). You can find more   support from smokefree.gov and other websites.  This information is not intended to replace advice given to you by your health care provider. Make sure you discuss any questions you have with your health care provider.  Document Released: 09/10/2009 Document Revised: 07/12/2016 Document Reviewed: 03/31/2015  Elsevier Interactive Patient Education © 2019 Elsevier Inc.

## 2019-04-26 NOTE — Progress Notes (Signed)
Jeremy Mack is a 73 y.o. male with the following history as recorded in Epic[employer]:  Patient Active Problem List   Diagnosis Date Noted  . Pancreatic abscess   . Malnutrition of moderate degree 06/14/2018  . Pancreatic mass   . Metastatic cancer ([place])   . Abnormal CT of the abdomen   . Abdominal pain 06/12/2018  . Leukocytosis 06/12/2018  . Pancreatic cancer ([place]) 06/12/2018  . AKI (acute kidney injury) ([place]) 06/12/2018  . Medicare annual wellness visit, subsequent 09/09/2016  . Routine general medical examination at a health care facility 09/09/2016  . Hyperlipidemia 09/07/2015  . Neck stiffness 05/20/2015  . Hip pain, chronic 05/20/2015  . Essential hypertension 05/20/2015    Current Outpatient Medications  Medication Sig Dispense Refill  . amLODipine (NORVASC) 5 MG tablet Take 1 tablet (5 mg total) by mouth daily. 90 tablet 0  . naproxen sodium (ALEVE) 220 MG tablet Take 440 mg by mouth 2 (two) times daily as needed (pain).     No current facility-administered medications for this visit.     Allergies: Patient has no known allergies.  Past Medical History:  Diagnosis Date  . Cancer ([place])   . GERD (gastroesophageal reflux disease)   . Hyperlipemia   . Hypertension     Past Surgical History:  Procedure Laterality Date  . arm fracture surgery Right   . colon polyps removed    . ESOPHAGOGASTRODUODENOSCOPY (EGD) WITH PROPOFOL N/A 06/14/2018   Procedure: ESOPHAGOGASTRODUODENOSCOPY (EGD) WITH PROPOFOL;  Surgeon: [name], MD;  Location: [facility];  Service: Endoscopy;  Laterality: N/A;  . EUS N/A 06/14/2018   Procedure: ESOPHAGEAL ENDOSCOPIC ULTRASOUND (EUS) RADIAL;  Surgeon: [name], MD;  Location: [facility];  Service: Endoscopy;  Laterality: N/A;  . FINE NEEDLE ASPIRATION N/A 06/14/2018   Procedure: FINE NEEDLE ASPIRATION (FNA) LINEAR;  Surgeon: [name], MD;  Location: [facility];  Service: Endoscopy;  Laterality: N/A;    Family History   Problem Relation Age of Onset  . Diabetes Mother   . Diabetes Maternal Grandmother     Social History   Tobacco Use  . Smoking status: Current Every Day Smoker    Packs/day: 0.50    Types: Cigars  . Smokeless tobacco: Never Used  Substance Use Topics  . Alcohol use: Yes    Alcohol/week: 0.0 standard drinks    Comment: Occasionally    Subjective:  Patient has not been seen here since October 2017; " I want to a physical." Has been working with [employer]- thinks his last physical was in February 2019; PSA was noted to be elevated here at last OV in 2017 and patient did not follow-up as requested. Was under care of GI last year for pancreatic abscess- did not do any type of follow-up there either; Notes that he has quit drinking completely/ smoking at least 1/2-1 ppd; not interested in quitting smoking;  History of hypertension- stopped his medication after his hospitalization last summer. Denies any chest pain, shortness of breath, blurred vision or headache   Notes he recently had left cataract surgery done- scheduled for follow-up in June; Sees dentist regularly; Exercise consists of yard work/ cleaning the house; Sleeps approximately 6-7 hours per night;     Objective:  Vitals:   04/26/19 1106 04/26/19 1145  BP: (!) 148/90 (!) 170/100  Pulse: 99   Temp: 98.2 F (36.8 C)   TempSrc: Oral   SpO2: 100%   Weight: 178 lb 6.4 oz (80.9 kg)  Height: 5' 11"  (1.803 m)     General: Well developed, well nourished, in no acute distress  Skin : Warm and dry.  Head: Normocephalic and atraumatic  Eyes: Sclera and conjunctiva clear; pupils round and reactive to light; extraocular movements intact  Ears: External normal; canals clear; tympanic membranes normal  Oropharynx: Pink, supple. No suspicious lesions  Neck: Supple without thyromegaly, adenopathy  Lungs: Respirations unlabored; clear to auscultation bilaterally without wheeze, rales, rhonchi  CVS exam: normal rate and  regular rhythm.  Abdomen: Soft; nontender; nondistended; normoactive bowel sounds; no masses or hepatosplenomegaly  Musculoskeletal: No deformities; no active joint inflammation  Extremities: No edema, cyanosis, clubbing  Vessels: Symmetric bilaterally  Neurologic: Alert and oriented; speech intact; face symmetrical; moves all extremities well; CNII-XII intact without focal deficit     Assessment:  1. Routine general medical examination at a health care facility   2. Pancreatic mass   3. Tobacco abuse   4. Lipid screening   5. Essential hypertension     Plan:  2. Will refer back to GI; update amylase and lipase today; patient is also overdue for colonoscopy; stressed need to follow-up as recommended; will let GI determine if further imaging is needed at this time since it has been almost a year since patient was last seen. 3. Stressed need to quit smoking- patient admits not interested; refer for lung cancer screen; 4. Check lipid panel; 5. Re-start Amlodipine 5 mg daily- follow-up in 1 month to re-check.   Return in about 1 month (around 05/27/2019).  Orders Placed This Encounter  Procedures  . CBC w/Diff    Standing Status:   Future    Number of Occurrences:   1    Standing Expiration Date:   04/25/2020  . Comp Met (CMET)    Standing Status:   Future    Number of Occurrences:   1    Standing Expiration Date:   04/25/2020  . Lipid panel    Standing Status:   Future    Number of Occurrences:   1    Standing Expiration Date:   04/25/2020  . PSA, Medicare    Standing Status:   Future    Number of Occurrences:   1    Standing Expiration Date:   04/25/2020  . Amylase    Standing Status:   Future    Number of Occurrences:   1    Standing Expiration Date:   04/25/2020  . Lipase    Standing Status:   Future    Number of Occurrences:   1    Standing Expiration Date:   04/25/2020  . Ambulatory referral to Gastroenterology    Referral Priority:   Routine    Referral Type:    Consultation    Referral Reason:   Specialty Services Required    Number of Visits Requested:   1  . Ambulatory Referral for Lung Cancer Scre    Referral Priority:   Routine    Referral Type:   Consultation    Referral Reason:   Specialty Services Required    Number of Visits Requested:   1    Requested Prescriptions   Signed Prescriptions Disp Refills  . amLODipine (NORVASC) 5 MG tablet 90 tablet 0    Sig: Take 1 tablet (5 mg total) by mouth daily.

## 2019-04-30 ENCOUNTER — Other Ambulatory Visit: Payer: Self-pay | Admitting: Acute Care

## 2019-04-30 DIAGNOSIS — Z87891 Personal history of nicotine dependence: Secondary | ICD-10-CM

## 2019-04-30 DIAGNOSIS — F17210 Nicotine dependence, cigarettes, uncomplicated: Secondary | ICD-10-CM

## 2019-04-30 DIAGNOSIS — Z122 Encounter for screening for malignant neoplasm of respiratory organs: Secondary | ICD-10-CM

## 2019-05-01 ENCOUNTER — Ambulatory Visit
Admission: RE | Admit: 2019-05-01 | Discharge: 2019-05-01 | Disposition: A | Discharge status: Home / Self Care | Payer: Medicare PPO | Source: Ambulatory Visit | Attending: Family | Admitting: Family

## 2019-05-01 DIAGNOSIS — K859 Acute pancreatitis without necrosis or infection, unspecified: Secondary | ICD-10-CM

## 2019-05-01 MED ORDER — IOPAMIDOL (ISOVUE-300) INJECTION 61%
100.0000 mL | Freq: Once | INTRAVENOUS | Status: AC | PRN
  Administered 2019-05-01: 100 mL via INTRAVENOUS

## 2019-05-07 ENCOUNTER — Other Ambulatory Visit: Payer: Self-pay | Admitting: Family

## 2019-05-07 DIAGNOSIS — C787 Secondary malignant neoplasm of liver and intrahepatic bile duct: Secondary | ICD-10-CM

## 2019-05-07 NOTE — Progress Notes (Signed)
Able to reach patient today to discuss results; explained that will need to see oncology to discuss/ evaluate liver metastases noted on CT scan. He expresses understanding and will also plan to keep follow-up with me for later this month.

## 2019-05-17 ENCOUNTER — Telehealth: Payer: Self-pay

## 2019-05-17 NOTE — Telephone Encounter (Signed)
Called pt to advise of appointment with Dr. Regan Rakers [facility] BP on 6/25 @ 1:45. Patient aware to arrive 20 minutes early and he is aware of visitor restrictions. Explained role of GI Navigator and direct number provided for questions or concerns.

## 2019-05-21 NOTE — Progress Notes (Addendum)
[place]  [phone] Fax:([phone]) [place] Note   Patient Care Team: [name], FNP as PCP - General (Internal Medicine) Arna Snipe, RN as Oncology Nurse Navigator 05/23/2019  CHIEF COMPLAINTS/PURPOSE OF CONSULTATION:  Liver masses, suspicious for malignancy of unknown primary    HISTORY OF PRESENTING ILLNESS:  Jeremy Mack 73 y.o. male is here because of concern for metastatic cancer. He initially presented to PCP in 05/2018 for abdominal pain, n/v, and weight loss. CT AP on 06/06/18 showed 4.9 x 7.3 x 8.8 macrocystic mass in the pancreas body concerning for neoplasm and likely metastatic locoregional lymphadenopathy with solid masses involing the left adrenal gland and spleen. There was mass effect on the SMV and splenic vein, mass was also noted to surround the celiac artery and left gastric artery. Incidental findings of possible hepatic cirrhosis and moderate prostate enlargement with nodular enhancement. Patient presented 1 week later to ED for worsening ABD pain and was admitted for further work up consisting of GI consult. He underwent EUS with FNA per Dr. Ardis Hughs on 06/14/18. Path on adrenal mass showed atypical cells concerning for neoplasm. Path on pancreas biopsy showed abundant neutrophils, no malignant cells. CA 19-9 was 1. He was treated with 3 week course of Augmentin per ID. Repeat CT on 07/11/18 showed significant improvement of the pancreatic mass and peripancreatic tissues. He was given a second 3-week course of Augmentin. He did not f/u after that. He presented for physical with his PCP 04/26/19 wh ordered repeat CT on 05/01/19 which showed multiple new hypovascular masses seen in right and left lobes, largest in segment 3 is 3.4 cm. new diffuse liver metastases and RP lymphadenopathy with stable adrenal mass. The previously seen pancreatic mass resolved.   PMH includes HTN, HL, and enlarged prostate. Recent PSA on 04/26/19 up  to 9.47 from 6.8 two years ago. He describes a colon surgery he had 30 years ago to remove a polyp in [facility], no colonoscopy since then. He denies family history of cancer. He reports past alcohol use for 10-15 years and 30 year smoking history 1/2 PPD and cigars. Denies other ilicit drugs. Socially he is married, lives with spouse. Has 4 healthy adult children and grandchildren. He is retired from [employer], working as a Museum/gallery curator. Has had many jobs. He is independent, active at home and in his garden. Drives himself.   Today he does not know why he's here. He feels well. Normal appetite. Denies weight loss or fatigue. Denies abdominal pain. Denies GI or GU bleeding. Denies fever, chills, cough, chest pain, dyspnea. He has occasional left leg pain if he stands too long.   MEDICAL HISTORY:  Past Medical History:  Diagnosis Date   Cancer ([place])    GERD (gastroesophageal reflux disease)    Hyperlipemia    Hypertension     SURGICAL HISTORY: Past Surgical History:  Procedure Laterality Date   arm fracture surgery Right    colon polyps removed     ESOPHAGOGASTRODUODENOSCOPY (EGD) WITH PROPOFOL N/A 06/14/2018   Procedure: ESOPHAGOGASTRODUODENOSCOPY (EGD) WITH PROPOFOL;  Surgeon: [name], MD;  Location: [facility];  Service: Endoscopy;  Laterality: N/A;   EUS N/A 06/14/2018   Procedure: ESOPHAGEAL ENDOSCOPIC ULTRASOUND (EUS) RADIAL;  Surgeon: [name], MD;  Location: [facility];  Service: Endoscopy;  Laterality: N/A;   FINE NEEDLE ASPIRATION N/A 06/14/2018   Procedure: FINE NEEDLE ASPIRATION (FNA) LINEAR;  Surgeon: [name], MD;  Location: [facility];  Service: Endoscopy;  Laterality: N/A;    SOCIAL HISTORY: Social History   Socioeconomic History   Marital status: Married    Spouse name: Not on file   Number of children: 4   Years of education: 14   Highest education level: Not on file  Occupational History   Occupation: Retired  Engineer, site strain: Not on file   Food insecurity    Worry: Not on file    Inability: Not on Lexicographer needs    Medical: Not on file    Non-medical: Not on file  Tobacco Use   Smoking status: Current Every Day Smoker    Packs/day: 0.50    Types: Cigars   Smokeless tobacco: Never Used  Substance and Sexual Activity   Alcohol use: Yes    Alcohol/week: 0.0 standard drinks    Comment: Occasionally   Drug use: No   Sexual activity: Not on file  Lifestyle   Physical activity    Days per week: Not on file    Minutes per session: Not on file   Stress: Not on file  Relationships   Social connections    Talks on phone: Not on file    Gets together: Not on file    Attends religious service: Not on file    Active member of club or organization: Not on file    Attends meetings of clubs or organizations: Not on file    Relationship status: Not on file   Intimate partner violence    Fear of current or ex partner: Not on file    Emotionally abused: Not on file    Physically abused: Not on file    Forced sexual activity: Not on file  Other Topics Concern   Not on file  Social History Narrative   Fun: Swimming, and outdoor sports   Denies religious beliefs effecting health care.     FAMILY HISTORY: Family History  Problem Relation Age of Onset   Diabetes Mother    Diabetes Maternal Grandmother     ALLERGIES:  has No Known Allergies.  MEDICATIONS:  Current Outpatient Medications  Medication Sig Dispense Refill   amLODipine (NORVASC) 5 MG tablet Take 1 tablet (5 mg total) by mouth daily. 90 tablet 0   naproxen sodium (ALEVE) 220 MG tablet Take 440 mg by mouth 2 (two) times daily as needed (pain).     No current facility-administered medications for this visit.     REVIEW OF SYSTEMS:   Constitutional: Denies fevers, chills, fatigue, weight loss, decreased appetite or abnormal night sweats Eyes: Denies blurriness of vision, double vision  or watery eyes Ears, nose, mouth, throat, and face: Denies mucositis or sore throat Respiratory: Denies cough, dyspnea or wheezes Cardiovascular: Denies palpitation, chest discomfort or lower extremity swelling Gastrointestinal:  Denies nausea, vomiting, constipation, diarrhea, GI bleeding, heartburn or change in bowel habits GU: denies dysuria or hematuria  Skin: Denies abnormal skin rashes Lymphatics: Denies new lymphadenopathy or easy bruising Neurological:Denies numbness, tingling or new weaknesses Behavioral/Psych: Mood is stable, no new changes  All other systems were reviewed with the patient and are negative.  PHYSICAL EXAMINATION: ECOG PERFORMANCE STATUS: 0 - Asymptomatic  Vitals:   05/23/19 1358  BP: (!) 153/98  Pulse: 97  Resp: 17  Temp: 98.4 F (36.9 C)  SpO2: 99%   Filed Weights   05/23/19 1358  Weight: 173 lb (78.5 kg)    GENERAL:alert, no distress and comfortable SKIN: no  rash  EYES: sclera clear NECK: supple, thyroid normal size, non-tender, without nodularity LYMPH:  no palpable cervical, supraclavicular, or axillary lymphadenopathy  LUNGS: clear to auscultation with normal breathing effort HEART: regular rate & rhythm, no lower extremity edema ABDOMEN:abdomen soft, non-tender and normal bowel sounds. Mild hepatomegaly Musculoskeletal:no cyanosis of digits  PSYCH: alert & oriented x 3 with fluent speech NEURO: normal gait   LABORATORY DATA:  I have reviewed the data as listed CBC Latest Ref Rng & Units 04/26/2019 07/23/2018 06/16/2018  WBC 4.0 - 10.5 K/uL 5.5 9.4 6.5  Hemoglobin 13.0 - 17.0 g/dL 17.0 14.4 11.8(L)  Hematocrit 39.0 - 52.0 % 48.0 42.2 35.6(L)  Platelets 150.0 - 400.0 K/uL 165.0 189.0 337    CMP Latest Ref Rng & Units 04/26/2019 07/23/2018 06/16/2018  Glucose 70 - 99 mg/dL 111(H) 98 123(H)  BUN 6 - 23 mg/dL 10 9 13   Creatinine 0.40 - 1.50 mg/dL 1.09 1.17 0.83  Sodium 135 - 145 mEq/L 145 138 140  Potassium 3.5 - 5.1 mEq/L 3.9 3.8 4.5    Chloride 96 - 112 mEq/L 107 104 108  CO2 19 - 32 mEq/L 28 25 25   Calcium 8.4 - 10.5 mg/dL 9.5 9.5 8.7(L)  Total Protein 6.0 - 8.3 g/dL 7.5 7.9 -  Total Bilirubin 0.2 - 1.2 mg/dL 0.7 1.1 -  Alkaline Phos 39 - 117 U/L 174(H) 110 -  AST 0 - 37 U/L 26 11 -  ALT 0 - 53 U/L 24 9 -     RADIOGRAPHIC STUDIES: I have personally reviewed the radiological images as listed and agreed with the findings in the report. Ct Abdomen Pelvis W Wo Contrast  Result Date: 05/01/2019 CLINICAL DATA:  Acute abdominal pain. Elevated pancreatic enzymes. EXAM: CT ABDOMEN AND PELVIS WITHOUT AND WITH CONTRAST TECHNIQUE: Multidetector CT imaging of the abdomen and pelvis was performed following the standard protocol before and following the bolus administration of intravenous contrast. CONTRAST:  171m ISOVUE-300 IOPAMIDOL (ISOVUE-300) INJECTION 61% COMPARISON:  07/11/2018 FINDINGS: Lower Chest: No acute findings. Hepatobiliary: Multiple new hypovascular masses are seen throughout the right and left lobes, consistent with diffuse liver metastases. Largest mass is located in segment 3 and measures 3.4 cm. A few tiny calcified gallstones are again seen, however there is no evidence of cholecystitis or biliary ductal dilatation. Pancreas: Previously seen peripherally enhancing mass along the superior aspect of the pancreatic body has resolved since previous study. No other pancreatic masses identified. No evidence of pancreatic ductal dilatation. No evidence of peripancreatic inflammatory changes or fluid collections. Spleen: 1.8 cm low-attenuation subcapsular lesion the anterior aspect of the spleen remains stable. No evidence of splenomegaly. Adrenals/Urinary Tract: Stable 2.4 cm left adrenal mass. Stable small bilateral renal cysts. No evidence of renal masses or hydronephrosis. Diffuse bladder wall thickening is seen, most likely due to chronic bladder outlet obstruction given markedly enlarged prostate. Stomach/Bowel: Surgical  staples noted in distal sigmoid colon. Diverticulosis is seen mainly involving the descending colon, however there is no evidence of diverticulitis. No evidence of obstruction, inflammatory process or abnormal fluid collections. Vascular/Lymphatic: New mild retroperitoneal lymphadenopathy in the aortocaval space, with largest lymph node measuring 1.6 cm. No other sites of lymphadenopathy identified. No abdominal aortic aneurysm. Aortic atherosclerosis. Reproductive: Stable markedly enlarged prostate gland with mass effect on bladder base. Symmetric seminal vesicles. Other:  None. Musculoskeletal:  No suspicious bone lesions identified. IMPRESSION: 1. New diffuse liver metastases. 2. New mild retroperitoneal lymphadenopathy, consistent with metastatic disease. 3. Resolution of previously seen pancreatic  mass since previous study. No radiographic evidence of acute pancreatitis. 4. Stable left adrenal mass, consistent with adrenal adenoma. 5. Stable markedly enlarged prostate and findings of chronic bladder outlet obstruction. 6. Colonic diverticulosis. No radiographic evidence of diverticulitis. 7. Cholelithiasis. Aortic Atherosclerosis (ICD10-I70.0). Electronically Signed   By: Earle Gell M.D.   On: 05/01/2019 16:04    ASSESSMENT & PLAN: 73 yo male with h/o HTN, HL, enlarged prostate with elevated PSA, and pancreatic abscess found to have multiple liver masses   1. Liver masses, suspicious for malignancy; unknown primary  -We reviewed his medical record in detail; recent CT AP showed multiple liver masses, largest in segment 3 measuring 3.4 cm, and mild RP adenopathy concerning for malignancy. Images were reviewed with the patient today. -he has not had recent colonoscopy or CT chest; will obtain PET scan to r/o occult primary  -will obtain liver biopsy  -we reviewed he may require additional testing/scope to determine the primary site after liver biopsy, he is agreeable  -Recent labs reviewed, CBC normal  on 04/26/19  -F/u after PET scan and biopsy  2. H/o pancreatic abscess  -imaging in 05/2018 suspicious for neoplasm, CA 19-9 was 1 -EUS and FNA per Dr. Ardis Hughs showed inflammation, no malignancy -s/p two 3-week courses of Augmentin in 05/2018 for pancreatic abscess, f/u CT showed near resolution of pancreatic abnormality -no pancreas mass on 04/2019 CT -[name] does not feel this is related to his new liver lesions  3. Smoking, alcohol use  -he reports a past history of alcohol use for 10-15 years, he does not drink now -CT in 05/2018 was concerning for hepatic cirrhosis -Labs on 04/26/19 per PCP showed AST/ALT normal, alk phos elevated to 174 -has 30 year smoking history, he has not had recent chest imaging   4. Enlarged prostate, elevated PSA  -PSA in 03/2019 9.47, up from 6.8 two years ago -enlarged prostate on CT -Denies urinary symptoms  -He was referred to urology by PCP  PLAN: -Medical record reviewed -PET scan and liver biopsy in 1-2 weeks -F/u and lab in 2-3 weeks to discuss results    Orders Placed This Encounter  Procedures   NM PET Image Initial (PI) Skull Base To Thigh    Standing Status:   Future    Standing Expiration Date:   05/22/2020    Order Specific Question:   ** REASON FOR EXAM (FREE TEXT)    Answer:   liver masses, RP adenopathy, no recent CT Chest, cancer of unknown primary    Order Specific Question:   If indicated for the ordered procedure, I authorize the administration of a radiopharmaceutical per Radiology protocol    Answer:   Yes    Order Specific Question:   Preferred imaging location?    Answer:   [facility]    Order Specific Question:   Radiology Contrast Protocol - do NOT remove file path    Answer:   [email or web address]   US BIOPSY (LIVER)    Standing Status:   Future    Standing Expiration Date:   07/22/2020    Order Specific Question:   Lab orders requested (DO NOT place separate lab orders, these will be  automatically ordered during procedure specimen collection):    Answer:   Surgical Pathology    Order Specific Question:   Reason for Exam (SYMPTOM  OR DIAGNOSIS REQUIRED)    Answer:   liver masses, suspicious for malignancy    Order Specific Question:   Preferred location?  Answer:   [facility]    All questions were answered. The patient knows to call the clinic with any problems, questions or concerns.     Alla Feeling, NP 05/23/2019 3:42 PM  Addendum  I have seen the patient, examined him. I agree with the assessment and and plan and have edited the notes.   I have reviewed pt's CT images from 05/01/2019 and last year.  His previous cystic lesion/abscess in pancreas resolved, however he has developed multiple liver lesions, and retroperitoneal adenopathy, highly suspicious for metastatic malignancy.  I reviewed the scan with patient.  I recommend PET scan for staging, and primary tumor. If no other metastasis which is easily accessible, will biopsy his liver lesion by IR. Depends on the biopsy results, he may need additional workup such as endoscopy. I will see him back after the biopsy.  Truitt Merle  05/23/2019

## 2019-05-23 ENCOUNTER — Telehealth: Payer: Self-pay

## 2019-05-23 ENCOUNTER — Inpatient Hospital Stay: Payer: Medicare PPO | Attending: Nurse Practitioner | Admitting: Nurse Practitioner

## 2019-05-23 ENCOUNTER — Encounter: Payer: Self-pay | Admitting: Nurse Practitioner

## 2019-05-23 ENCOUNTER — Other Ambulatory Visit: Payer: Self-pay

## 2019-05-23 VITALS — BP 153/98 | HR 97 | Temp 98.4°F | Resp 17 | Ht 71.0 in | Wt 173.0 lb

## 2019-05-23 DIAGNOSIS — E279 Disorder of adrenal gland, unspecified: Secondary | ICD-10-CM | POA: Insufficient documentation

## 2019-05-23 DIAGNOSIS — Z7289 Other problems related to lifestyle: Secondary | ICD-10-CM | POA: Insufficient documentation

## 2019-05-23 DIAGNOSIS — F17290 Nicotine dependence, other tobacco product, uncomplicated: Secondary | ICD-10-CM | POA: Diagnosis not present

## 2019-05-23 DIAGNOSIS — N400 Enlarged prostate without lower urinary tract symptoms: Secondary | ICD-10-CM | POA: Insufficient documentation

## 2019-05-23 DIAGNOSIS — R590 Localized enlarged lymph nodes: Secondary | ICD-10-CM | POA: Insufficient documentation

## 2019-05-23 DIAGNOSIS — N281 Cyst of kidney, acquired: Secondary | ICD-10-CM | POA: Diagnosis not present

## 2019-05-23 DIAGNOSIS — Z8719 Personal history of other diseases of the digestive system: Secondary | ICD-10-CM | POA: Insufficient documentation

## 2019-05-23 DIAGNOSIS — I10 Essential (primary) hypertension: Secondary | ICD-10-CM | POA: Diagnosis not present

## 2019-05-23 DIAGNOSIS — E785 Hyperlipidemia, unspecified: Secondary | ICD-10-CM | POA: Insufficient documentation

## 2019-05-23 DIAGNOSIS — K8020 Calculus of gallbladder without cholecystitis without obstruction: Secondary | ICD-10-CM | POA: Insufficient documentation

## 2019-05-23 DIAGNOSIS — Z86010 Personal history of colonic polyps: Secondary | ICD-10-CM | POA: Diagnosis not present

## 2019-05-23 DIAGNOSIS — N320 Bladder-neck obstruction: Secondary | ICD-10-CM | POA: Insufficient documentation

## 2019-05-23 DIAGNOSIS — Z79899 Other long term (current) drug therapy: Secondary | ICD-10-CM | POA: Diagnosis not present

## 2019-05-23 DIAGNOSIS — R160 Hepatomegaly, not elsewhere classified: Secondary | ICD-10-CM | POA: Diagnosis not present

## 2019-05-23 DIAGNOSIS — K5730 Diverticulosis of large intestine without perforation or abscess without bleeding: Secondary | ICD-10-CM | POA: Insufficient documentation

## 2019-05-23 DIAGNOSIS — I700 Atherosclerosis of aorta: Secondary | ICD-10-CM | POA: Diagnosis not present

## 2019-05-23 DIAGNOSIS — R972 Elevated prostate specific antigen [PSA]: Secondary | ICD-10-CM | POA: Diagnosis not present

## 2019-05-23 NOTE — Telephone Encounter (Signed)
Called and spoke with Jeremy Mack to provide direction and to review time for initial consult today @ 1:45.

## 2019-05-23 NOTE — Progress Notes (Signed)
Met with patient during initial consult. Explained role of nurse navigator and provided contact information for treatment team members. Will provided additional educational material when diagnosis is final.  No barriers to care identified at this time. Will f/u on scheduling PET scan.

## 2019-05-24 ENCOUNTER — Telehealth: Payer: Self-pay | Admitting: Nurse Practitioner

## 2019-05-24 ENCOUNTER — Other Ambulatory Visit: Payer: Self-pay | Admitting: Family

## 2019-05-24 ENCOUNTER — Telehealth: Payer: Self-pay

## 2019-05-24 ENCOUNTER — Telehealth: Payer: Self-pay | Admitting: Family

## 2019-05-24 DIAGNOSIS — I10 Essential (primary) hypertension: Secondary | ICD-10-CM

## 2019-05-24 MED ORDER — AMLODIPINE BESYLATE 10 MG PO TABS: 10.0000 mg | ORAL_TABLET | Freq: Every day | ORAL | 0 refills | Status: DC

## 2019-05-24 NOTE — Telephone Encounter (Signed)
Please let Mr. Jeremy Mack that I understand why he wants to cancel his appointment for Monday here. We will go ahead and cancel that for him.  In the interim, I am going to increase his Amlodipine to 10 mg daily; plan to follow-up here for blood pressure follow-up in early August.

## 2019-05-24 NOTE — Telephone Encounter (Signed)
Called and left message for patient.

## 2019-05-24 NOTE — Telephone Encounter (Signed)
Spoke directly with pt to review instructions for PET scan on 05/28/19 @ 4 PM.

## 2019-05-24 NOTE — Telephone Encounter (Signed)
Called and left detailed message on patient's VM to advise of PET scan at [facility] on 6/30 4 PM with arrival at 3:30 PM. NPO (including gum and mints) after 10 AM . Limit carbohydrates 24 hours prior. Requested call back to confirm.

## 2019-05-24 NOTE — Telephone Encounter (Signed)
-----  Message from Arna Snipe, RN sent at 05/23/2019  3:31 PM EDT ----- Aileen Pilot,  I'm the GI navigator at [facility]. We met with Mr. Anne this afternoon and [name] is ordering a PET scan and probable liver BX.  Mr. Pieroni asked me to reach out to you. He is concerned that his apt with you on 6/29 may need to be pushed back until the testing is completed and he has some answers.  Thank you for the referral,  [facility]

## 2019-05-24 NOTE — Telephone Encounter (Signed)
Scheduled appt per 6/25 los. Left a voice message of appt date and time.

## 2019-05-27 ENCOUNTER — Ambulatory Visit: Payer: 59 | Admitting: Family

## 2019-05-28 ENCOUNTER — Ambulatory Visit (HOSPITAL_COMMUNITY)
Admission: RE | Admit: 2019-05-28 | Discharge: 2019-05-28 | Disposition: A | Discharge status: Home / Self Care | Payer: Medicare PPO | Source: Ambulatory Visit | Attending: Nurse Practitioner | Admitting: Nurse Practitioner

## 2019-05-28 ENCOUNTER — Other Ambulatory Visit: Payer: Self-pay

## 2019-05-28 DIAGNOSIS — R160 Hepatomegaly, not elsewhere classified: Secondary | ICD-10-CM | POA: Insufficient documentation

## 2019-05-28 LAB — GLUCOSE, CAPILLARY: Glucose-Capillary: 110 mg/dL — ABNORMAL HIGH (ref 70–99)

## 2019-05-28 MED ORDER — FLUDEOXYGLUCOSE F - 18 (FDG) INJECTION
9.0800 | Freq: Once | INTRAVENOUS | Status: AC
  Administered 2019-05-28: 9.08 via INTRAVENOUS

## 2019-05-29 ENCOUNTER — Encounter: Payer: 59 | Admitting: Acute Care

## 2019-05-29 ENCOUNTER — Inpatient Hospital Stay: Admission: RE | Admit: 2019-05-29 | Payer: 59 | Source: Ambulatory Visit

## 2019-05-29 ENCOUNTER — Telehealth: Payer: Self-pay | Admitting: Nurse Practitioner

## 2019-05-29 NOTE — Telephone Encounter (Signed)
After reviewing PET scan with [name], I called the patient to discuss results which is concerning for widespread metastatic disease in the lungs, lymph nodes, skeleton, and liver. The plan to continue with liver biopsy on 7/7 for tissue confirmation and to determine the primary site. Treatment plan depends on biopsy result. He voices understanding. He denies specific concerns; denies pain, n/v/c/d, fever, chills. He is eating and drinking. I reviewed his upcoming appointments and explained general biopsy procedure. I also explained he does not need CT chest today for lung cancer screening, he understands and appreciates the call. [name], NP  05/29/2019

## 2019-05-30 NOTE — Progress Notes (Signed)
Jeremy Mack   Telephone:([phone]) [phone] [phone]) [phone]   Clinic Follow up Note   Patient Care Team: [name], FNP as PCP - General (Internal Medicine) Arna Snipe, RN as Oncology Nurse Navigator  Date of Service:  06/06/2019  CHIEF COMPLAINT: Metastatic Small Cell Lung Cancer     SUMMARY OF ONCOLOGIC HISTORY: Oncology History Overview Note  Cancer Staging Small cell lung cancer ([place]) Staging form: Lung, AJCC 8th Edition - Clinical: Stage IVB (cTX, cN2, pM1c) - Signed by Truitt Merle, MD on 06/06/2019    Small cell lung cancer ([place])  07/11/2018 Imaging   CT AP 07/11/18 IMPRESSION: 1. Significantly improved appearance of the pancreas and peripancreatic tissues. Improved to resolved peripancreatic edema with resolved and significantly improved peripancreatic lesions. The remaining lesion was felt to represent abscess on prior endoscopic ultrasound sampling. Pseudocyst or infected pseudocyst would be the favored imaging diagnosis. 2. Persistent but improved mass effect upon the inferior aspect of the portal vein and superior aspect of the superior mesenteric vein. 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 3. Aortic Atherosclerosis (ICD10-I70.0). Bilateral common iliac artery ectasia. 4. A left adrenal nodule is indeterminate on precontrast imaging and may have decreased in size since the prior. Consider dedicated adrenal protocol CT. This could either be performed in 6 months to confirm size stability or more acutely, depending on clinical concern. 5. A splenic lesion is indeterminate and can be re-evaluated on follow-up. 6. Hepatic morphology suspicious for cirrhosis. 7. Cholelithiasis. 8.  Emphysema (ICD10-J43.9).   05/01/2019 Imaging   CT AP 05/01/19  IMPRESSION: 1. New diffuse liver metastases. 2. New mild retroperitoneal lymphadenopathy, consistent with metastatic disease. 3. Resolution of previously seen pancreatic  mass since previous study. No radiographic evidence of acute pancreatitis. 4. Stable left adrenal mass, consistent with adrenal adenoma. 5. Stable markedly enlarged prostate and findings of chronic bladder outlet obstruction. 6. Colonic diverticulosis. No radiographic evidence of diverticulitis. 7. Cholelithiasis.   Aortic Atherosclerosis (ICD10-I70.0).   05/28/2019 PET scan   PET 05/28/19  IMPRESSION: 1. Examination is positive for widespread FDG avid liver metastases. Liver lesions are too numerous to count. 2. Hypermetabolic mediastinal and upper abdominal nodal metastases. 3. Left upper lobe and right upper lobe hypermetabolic pulmonary nodules consistent with pulmonary metastasis. 4. Innumerable liver metastases noted within the axial and proximal appendicular skeleton. Bone lesions are too numerous to count. Some of these have a corresponding lytic lesion on CT images. Others appear occult on the corresponding CT images. 5. Solid hyperdense left adrenal nodule is not significantly changed from 05/01/2019 and exhibits mild FDG uptake. Indeterminate. 6. Prostate gland enlargement. Focal area of increased uptake within the left posterior gland is indeterminate.   06/04/2019 Initial Biopsy   Biopsy 06/04/19  Diagnosis Liver, needle/core biopsy - SMALL CELL CARCINOMA. SEE COMMENT.   06/06/2019 Initial Diagnosis   Small cell lung cancer ([place])   06/06/2019 Cancer Staging   Staging form: Lung, AJCC 8th Edition - Clinical: Stage IVB (cTX, cN2, pM1c) - Signed by Truitt Merle, MD on 06/06/2019    Chemotherapy   Carboplatin Day 1 and Etopside day 1-3 with immunotherapy atezolizumab (Tecentriq) on day 1, every 3 weeks for 6 cycles. Plan to continue maintenance Tecentriq afterward.       CURRENT THERAPY:  Pending first line Carboplatin Day 1 and Etopside day 1-3 with immunotherapy atezolizumab (Tecentriq) on day 1, every 3 weeks for 6 cycles starting 06/10/19.   INTERVAL HISTORY:  Jeremy Mack is here for a follow  up. He presents to the clinic alone. He notes his biopsy was nearly painless and went well. He notes since last visit there has been no changes. He notes his energy level is adequate. He is active in his garden.  He notes he is a smoker 1/2 ppd. He is willing to quit. He notes he lives with his wife at home. He is willing to tell his wife. He had been taking care of her after her leg surgery. He has 4 grown children.    REVIEW OF SYSTEMS:   Constitutional: Denies fevers, chills or abnormal weight loss Eyes: Denies blurriness of vision Ears, nose, mouth, throat, and face: Denies mucositis or sore throat Respiratory: Denies cough, dyspnea or wheezes Cardiovascular: Denies palpitation, chest discomfort or lower extremity swelling Gastrointestinal:  Denies nausea, heartburn or change in bowel habits Skin: Denies abnormal skin rashes Lymphatics: Denies new lymphadenopathy or easy bruising Neurological:Denies numbness, tingling or new weaknesses Behavioral/Psych: Mood is stable, no new changes  All other systems were reviewed with the patient and are negative.  MEDICAL HISTORY:  Past Medical History:  Diagnosis Date  . Cancer ([place])   . GERD (gastroesophageal reflux disease)   . Hyperlipemia   . Hypertension     SURGICAL HISTORY: Past Surgical History:  Procedure Laterality Date  . arm fracture surgery Right   . colon polyps removed    . ESOPHAGOGASTRODUODENOSCOPY (EGD) WITH PROPOFOL N/A 06/14/2018   Procedure: ESOPHAGOGASTRODUODENOSCOPY (EGD) WITH PROPOFOL;  Surgeon: [name], MD;  Location: [facility];  Service: Endoscopy;  Laterality: N/A;  . EUS N/A 06/14/2018   Procedure: ESOPHAGEAL ENDOSCOPIC ULTRASOUND (EUS) RADIAL;  Surgeon: [name], MD;  Location: [facility];  Service: Endoscopy;  Laterality: N/A;  . FINE NEEDLE ASPIRATION N/A 06/14/2018   Procedure: FINE NEEDLE ASPIRATION (FNA) LINEAR;  Surgeon: [name], MD;  Location: [facility]  ENDOSCOPY;  Service: Endoscopy;  Laterality: N/A;  . TONSILLECTOMY      I have reviewed the social history and family history with the patient and they are unchanged from previous note.  ALLERGIES:  has No Known Allergies.  MEDICATIONS:  Current Outpatient Medications  Medication Sig Dispense Refill  . amLODipine (NORVASC) 10 MG tablet Take 1 tablet (10 mg total) by mouth daily. 90 tablet 0  . aspirin-sod bicarb-citric acid (ALKA-SELTZER) 325 MG TBEF tablet Take 650 mg by mouth every 6 (six) hours as needed.    . Omega-3 Fatty Acids (FISH OIL) 1000 MG CAPS Take 1 capsule by mouth daily.    Marland Kitchen allopurinol (ZYLOPRIM) 300 MG tablet Take 1 tablet (300 mg total) by mouth 2 (two) times daily. 60 tablet 0  . naproxen sodium (ALEVE) 220 MG tablet Take 440 mg by mouth 2 (two) times daily as needed (pain).    . nicotine (NICODERM CQ - DOSED IN MG/24 HOURS) 14 mg/24hr patch Place 1 patch (14 mg total) onto the skin daily. 28 patch 0  . ondansetron (ZOFRAN) 8 MG tablet Take 1 tablet (8 mg total) by mouth 2 (two) times daily as needed for refractory nausea / vomiting. Start on day 3 after carboplatin chemo. 30 tablet 1  . prochlorperazine (COMPAZINE) 10 MG tablet Take 1 tablet (10 mg total) by mouth every 6 (six) hours as needed (Nausea or vomiting). 30 tablet 1   Current Facility-Administered Medications  Medication Dose Route Frequency Provider Last Rate Last Dose  . 0.9 %  sodium chloride infusion   Intravenous Continuous Truitt Merle, MD  PHYSICAL EXAMINATION: ECOG PERFORMANCE STATUS: 0 - Asymptomatic  Vitals:   06/06/19 1311  BP: 139/85  Pulse: (!) 103  Resp: 17  Temp: (!) 96.8 F (36 C)  SpO2: 97%   Filed Weights   06/06/19 1311  Weight: 168 lb 11.2 oz (76.5 kg)    GENERAL:alert, no distress and comfortable SKIN: skin color, texture, turgor are normal, no rashes or significant lesions EYES: normal, Conjunctiva are pink and non-injected, sclera clear  NECK: supple, thyroid  normal size, non-tender, without nodularity LYMPH:  no palpable lymphadenopathy in the cervical, axillary  LUNGS: clear to auscultation and percussion with normal breathing effort HEART: regular rate & rhythm and no murmurs and no lower extremity edema ABDOMEN:abdomen soft, non-tender and normal bowel sounds Musculoskeletal:no cyanosis of digits and no clubbing  NEURO: alert & oriented x 3 with fluent speech, no focal motor/sensory deficits  LABORATORY DATA:  I have reviewed the data as listed CBC Latest Ref Rng & Units 06/04/2019 04/26/2019 07/23/2018  WBC 4.0 - 10.5 K/uL 7.4 5.5 9.4  Hemoglobin 13.0 - 17.0 g/dL 17.1(H) 17.0 14.4  Hematocrit 39.0 - 52.0 % 51.0 48.0 42.2  Platelets 150 - 400 K/uL 240 165.0 189.0     CMP Latest Ref Rng & Units 06/06/2019 06/04/2019 04/26/2019  Glucose 70 - 99 mg/dL 102(H) 110(H) 111(H)  BUN 8 - 23 mg/dL 15 23 10   Creatinine 0.61 - 1.24 mg/dL 1.40(H) 1.70(H) 1.09  Sodium 135 - 145 mmol/L 140 138 145  Potassium 3.5 - 5.1 mmol/L 4.7 4.3 3.9  Chloride 98 - 111 mmol/L 103 103 107  CO2 22 - 32 mmol/L 23 24 28   Calcium 8.9 - 10.3 mg/dL 10.0 9.7 9.5  Total Protein 6.5 - 8.1 g/dL - 8.7(H) 7.5  Total Bilirubin 0.3 - 1.2 mg/dL - 0.8 0.7  Alkaline Phos 38 - 126 U/L - 264(H) 174(H)  AST 15 - 41 U/L - 78(H) 26  ALT 0 - 44 U/L - 84(H) 24    RADIOGRAPHIC STUDIES: I have personally reviewed the radiological images as listed and agreed with the findings in the report. No results found.   ASSESSMENT & PLAN:  Meredith Mells is a 73 y.o. male with   1. Small Cell Lung Cancer, Right lung primary, Metastatic to liver, left lung, LNs and bone -I personally reviewed his PET images from 05/28/19 with pt in detail, which shows diffuse metastatic cancer in liver, upper abdominal nodal mets, bilateral lung lesion, right mediastinal nodes and diffuse bone mets.  -We discussed his liver biopsy from 06/04/19 which shows metastatic Small Cell lung cancer, TTF(+). This is most consistent  with lung primary.  Given his mediastinal lymph node distribution, I think the right lung lesion is likely the primary tumor. -We will get a brain MRI to complete staging. -I discussed given his stage IV metastatic disease, his cancer is no longer curable but still treatable. I discussed this is a very aggressive disease and without treatment his life expectancy is likely less than 6 months -I discussed treatment options, mainly systemic chemotherapy. I recommend first line Carboplatin on day 1 and Etopside on day 1-3 with immunotherapy atezolizumab (Tecentriq) on day 1 treatment every 3 weeks for 6 cycles, based on the NCCN guideline. Plan to continue maintenance Tecentriq afterward.   --Chemotherapy consent: Side effects including but does not limited to, fatigue, nausea, vomiting, diarrhea, hair loss, neuropathy, fluid retention, renal and kidney dysfunction, neutropenic fever, needed for blood transfusion, bleeding, autoimmune related disorders, pneumonitis, endocrine dysfunction,  etc, were discussed with patient in great detail. He agrees to proceed. I recommend he start chemo early next week.  -Goal of therapy is palliative to control his disease and prolong his life.  -Given long term IV treatment, I recommend PAC. He declined for now.  -Will proceed with chemo education class before start of treatment.  -For tumorlysis syndrome prevention I will call in Allopurinol today.  -This type of cancer can often spread to brain, will obtain MRI brain in the next 1-2 weeks. He is agreeable.  -He has developed AKI and mild Transaminitis. Labs reviewed today, Cr improved to 1.4, EGFR 59, uric acid normal.  Will proceed with IV Fluids today.  -F/u one week after chemo   2. AKI  -06/04/19 Cr at 1.70, previously normal. Possible related to dehydration vs mild tumor lysis  -Cr at 1.4, uric acid 6.9 today (06/06/19). Will receive IV Fluids today.   3. H/o pancreatic abscess  -imaging in 05/2018 suspicious for  neoplasm, CA 19-9 was 1 -EUS and FNA per Dr. Ardis Hughs showed inflammation, no malignancy -s/p two 3-week courses of Augmentin in 05/2018 for pancreatic abscess, f/u CT showed near resolution of pancreatic abnormality -no pancreas mass on 04/2019 CT -I do not feel this is related to his new liver lesions  4. Smoking Cessation, H/o alcohol use  -He reports a past history of alcohol use for 10-15 years, he does not drink now.  -CT in 05/2018 was concerning for hepatic cirrhosis -has 30 year smoking history.  -Liver biopsy from 05/2019 shows metastatic small Cell Lung cancer, Likely related to his heavy smoking history.  -He still smokes 1/2 ppd. I strongly encouraged him to quit smoking as this can further worsen his condition. He is agreeable and interested in help.  -I will call in nicotine patch today (06/06/19)  5. Enlarged prostate, elevated PSA  -PSA in 03/2019 9.47, up from 6.8 two years ago -enlarged prostate on CT -Denies urinary symptoms  -He was referred to urology by PCP  6. Goal of care discussion  -We again discussed the incurable nature of his cancer, and the overall poor prognosis, especially if he does not have good response to chemotherapy or progress on chemo -The patient understands the goal of care is palliative. -he is full code now    PLAN: -I called in Nicotine patch and allopurinol today I also called in antiemetic -Labs reviewed, Cr 1.4, Will give IV Fluids today  -Lab, f/u and chemo on 7/13-15 with Udenyca on 7/20 -MRI brian in 1-2 weeks  -lab, f/u on 7/20 -chemo class tomorrow    No problem-specific Assessment & Plan notes found for this encounter.   Orders Placed This Encounter  Procedures  . MR Brain W Wo Contrast    Standing Status:   Future    Standing Expiration Date:   06/05/2020    Order Specific Question:   If indicated for the ordered procedure, I authorize the administration of contrast media per Radiology protocol    Answer:   Yes    Order  Specific Question:   What is the patient's sedation requirement?    Answer:   No Sedation    Order Specific Question:   Does the patient have a pacemaker or implanted devices?    Answer:   No    Order Specific Question:   Use SRS Protocol?    Answer:   No    Order Specific Question:   Radiology Contrast Protocol - do NOT  remove file path    Answer:   [email or web address]    Order Specific Question:   Preferred imaging location?    Answer:   [facility] (table limit-350 lbs)  . Uric acid    Standing Status:   Future    Number of Occurrences:   1    Standing Expiration Date:   06/05/2020  . Basic Metabolic Panel - [place] Only    Standing Status:   Future    Number of Occurrences:   1    Standing Expiration Date:   06/05/2020   All questions were answered. The patient knows to call the clinic with any problems, questions or concerns. No barriers to learning was detected. I spent 30 minutes counseling the patient face to face. The total time spent in the appointment was 40 minutes and more than 50% was on counseling and review of test results     Truitt Merle, MD 06/06/2019   I, Joslyn Devon, am acting as scribe for Truitt Merle, MD.   I have reviewed the above documentation for accuracy and completeness, and I agree with the above.

## 2019-06-02 ENCOUNTER — Other Ambulatory Visit: Payer: Self-pay | Admitting: Radiology

## 2019-06-04 ENCOUNTER — Encounter (HOSPITAL_COMMUNITY): Payer: Self-pay

## 2019-06-04 ENCOUNTER — Other Ambulatory Visit: Payer: Self-pay

## 2019-06-04 ENCOUNTER — Ambulatory Visit (HOSPITAL_COMMUNITY)
Admission: RE | Admit: 2019-06-04 | Discharge: 2019-06-04 | Disposition: A | Discharge status: Home / Self Care | Payer: Medicare PPO | Source: Ambulatory Visit | Attending: Nurse Practitioner | Admitting: Nurse Practitioner

## 2019-06-04 DIAGNOSIS — E785 Hyperlipidemia, unspecified: Secondary | ICD-10-CM | POA: Insufficient documentation

## 2019-06-04 DIAGNOSIS — I10 Essential (primary) hypertension: Secondary | ICD-10-CM | POA: Diagnosis not present

## 2019-06-04 DIAGNOSIS — F17290 Nicotine dependence, other tobacco product, uncomplicated: Secondary | ICD-10-CM | POA: Diagnosis not present

## 2019-06-04 DIAGNOSIS — R160 Hepatomegaly, not elsewhere classified: Secondary | ICD-10-CM

## 2019-06-04 DIAGNOSIS — Z833 Family history of diabetes mellitus: Secondary | ICD-10-CM | POA: Diagnosis not present

## 2019-06-04 DIAGNOSIS — Z79899 Other long term (current) drug therapy: Secondary | ICD-10-CM | POA: Insufficient documentation

## 2019-06-04 LAB — COMPREHENSIVE METABOLIC PANEL
ALT: 84 U/L — ABNORMAL HIGH (ref 0–44)
AST: 78 U/L — ABNORMAL HIGH (ref 15–41)
Albumin: 4.1 g/dL (ref 3.5–5.0)
Alkaline Phosphatase: 264 U/L — ABNORMAL HIGH (ref 38–126)
Anion gap: 11 (ref 5–15)
BUN: 23 mg/dL (ref 8–23)
CO2: 24 mmol/L (ref 22–32)
Calcium: 9.7 mg/dL (ref 8.9–10.3)
Chloride: 103 mmol/L (ref 98–111)
Creatinine, Ser: 1.7 mg/dL — ABNORMAL HIGH (ref 0.61–1.24)
GFR calc Af Amer: 46 mL/min — ABNORMAL LOW (ref >60)
GFR calc non Af Amer: 40 mL/min — ABNORMAL LOW (ref >60)
Glucose, Bld: 110 mg/dL — ABNORMAL HIGH (ref 70–99)
Potassium: 4.3 mmol/L (ref 3.5–5.1)
Sodium: 138 mmol/L (ref 135–145)
Total Bilirubin: 0.8 mg/dL (ref 0.3–1.2)
Total Protein: 8.7 g/dL — ABNORMAL HIGH (ref 6.5–8.1)

## 2019-06-04 LAB — CBC WITH DIFFERENTIAL/PLATELET
Abs Immature Granulocytes: 0.05 10*3/uL (ref 0.00–0.07)
Basophils Absolute: 0.1 10*3/uL (ref 0.0–0.1)
Basophils Relative: 1 %
Eosinophils Absolute: 0.1 10*3/uL (ref 0.0–0.5)
Eosinophils Relative: 1 %
HCT: 51 % (ref 39.0–52.0)
Hemoglobin: 17.1 g/dL — ABNORMAL HIGH (ref 13.0–17.0)
Immature Granulocytes: 1 %
Lymphocytes Relative: 26 %
Lymphs Abs: 1.9 10*3/uL (ref 0.7–4.0)
MCH: 32.7 pg (ref 26.0–34.0)
MCHC: 33.5 g/dL (ref 30.0–36.0)
MCV: 97.5 fL (ref 80.0–100.0)
Monocytes Absolute: 0.8 10*3/uL (ref 0.1–1.0)
Monocytes Relative: 10 %
Neutro Abs: 4.6 10*3/uL (ref 1.7–7.7)
Neutrophils Relative %: 61 %
Platelets: 240 10*3/uL (ref 150–400)
RBC: 5.23 MIL/uL (ref 4.22–5.81)
RDW: 13.5 % (ref 11.5–15.5)
WBC: 7.4 10*3/uL (ref 4.0–10.5)
nRBC: 0 % (ref 0.0–0.2)

## 2019-06-04 LAB — PROTIME-INR
INR: 1 (ref 0.8–1.2)
Prothrombin Time: 13.5 seconds (ref 11.4–15.2)

## 2019-06-04 MED ORDER — FENTANYL CITRATE (PF) 100 MCG/2ML IJ SOLN
INTRAMUSCULAR | Status: AC | PRN
  Administered 2019-06-04 (×2): 50 ug via INTRAVENOUS

## 2019-06-04 MED ORDER — SODIUM CHLORIDE 0.9 % IV SOLN
INTRAVENOUS | Status: DC
  Administered 2019-06-04 (×2): via INTRAVENOUS

## 2019-06-04 MED ORDER — FENTANYL CITRATE (PF) 100 MCG/2ML IJ SOLN
INTRAMUSCULAR | Status: AC
  Filled 2019-06-04: qty 2

## 2019-06-04 MED ORDER — MIDAZOLAM HCL 2 MG/2ML IJ SOLN
INTRAMUSCULAR | Status: AC | PRN
  Administered 2019-06-04 (×2): 1 mg via INTRAVENOUS

## 2019-06-04 MED ORDER — MIDAZOLAM HCL 2 MG/2ML IJ SOLN
INTRAMUSCULAR | Status: AC
  Filled 2019-06-04: qty 2

## 2019-06-04 MED ORDER — LIDOCAINE HCL (PF) 1 % IJ SOLN
INTRAMUSCULAR | Status: AC | PRN
  Administered 2019-06-04: 10 mL

## 2019-06-04 MED ORDER — GELATIN ABSORBABLE 12-7 MM EX MISC
CUTANEOUS | Status: AC
  Filled 2019-06-04: qty 1

## 2019-06-04 MED ORDER — LIDOCAINE HCL 1 % IJ SOLN
INTRAMUSCULAR | Status: AC
  Filled 2019-06-04: qty 20

## 2019-06-04 NOTE — Procedures (Signed)
Interventional Radiology Procedure Note  Procedure: US guided liver mass biopsy Complications: None Recommendations:  - advance diet - follow up path - 2 hours dc home when goals met - Do not submerge for 7 days - Routine care   Signed,  Dulcy Fanny. Earleen Newport, DO

## 2019-06-04 NOTE — Discharge Instructions (Signed)
Liver Biopsy, Care After °These instructions give you information on caring for yourself after your procedure. Your doctor may also give you more specific instructions. Call your doctor if you have any problems or questions after your procedure. °What can I expect after the procedure? °After the procedure, it is common to have: °· Pain and soreness where the biopsy was done. °· Bruising around the area where the biopsy was done. °· Sleepiness and be tired for a few days. °Follow these instructions at home: °Medicines °· Take over-the-counter and prescription medicines only as told by your doctor. °· If you were prescribed an antibiotic medicine, take it as told by your doctor. Do not stop taking the antibiotic even if you start to feel better. °· Do not take medicines such as aspirin and ibuprofen. These medicines can thin your blood. Do not take these medicines unless your doctor tells you to take them. °· If you are taking prescription pain medicine, take actions to prevent or treat constipation. Your doctor may recommend that you: °? Drink enough fluid to keep your pee (urine) clear or pale yellow. °? Take over-the-counter or prescription medicines. °? Eat foods that are high in fiber, such as fresh fruits and vegetables, whole grains, and beans. °? Limit foods that are high in fat and processed sugars, such as fried and sweet foods. °Caring for your cut °· Follow instructions from your doctor about how to take care of your cuts from surgery (incisions). Make sure you: °? Wash your hands with soap and water before you change your bandage (dressing). If you cannot use soap and water, use hand sanitizer. °? Change your bandage as told by your doctor. °? Leave stitches (sutures), skin glue, or skin tape (adhesive) strips in place. They may need to stay in place for 2 weeks or longer. If tape strips get loose and curl up, you may trim the loose edges. Do not remove tape strips completely unless your doctor says it is  okay. °· Check your cuts every day for signs of infection. Check for: °? Redness, swelling, or more pain. °? Fluid or blood. °? Pus or a bad smell. °? Warmth. °· Do not take baths, swim, or use a hot tub until your doctor says it is okay to do so. °Activity ° °· Rest at home for 1-2 days or as told by your doctor. °? Avoid sitting for a long time without moving. Get up to take short walks every 1-2 hours. °· Return to your normal activities as told by your doctor. Ask what activities are safe for you. °· Do not do these things in the first 24 hours: °? Drive. °? Use machinery. °? Take a bath or shower. °· Do not lift more than 10 pounds (4.5 kg) or play contact sports for the first 2 weeks. °General instructions ° °· Do not drink alcohol in the first week after the procedure. °· Have someone stay with you for at least 24 hours after the procedure. °· Get your test results. Ask your doctor or the department that is doing the test: °? When will my results be ready? °? How will I get my results? °? What are my treatment options? °? What other tests do I need? °? What are my next steps? °· Keep all follow-up visits as told by your doctor. This is important. °Contact a doctor if: °· A cut bleeds and leaves more than just a small spot of blood. °· A cut is red, puffs up (  swells), or hurts more than before. °· Fluid or something else comes from a cut. °· A cut smells bad. °· You have a fever or chills. °Get help right away if: °· You have swelling, bloating, or pain in your belly (abdomen). °· You get dizzy or faint. °· You have a rash. °· You feel sick to your stomach (nauseous) or throw up (vomit). °· You have trouble breathing, feel short of breath, or feel faint. °· Your chest hurts. °· You have problems talking or seeing. °· You have trouble with your balance or moving your arms or legs. °Summary °· After the procedure, it is common to have pain, soreness, bruising, and tiredness. °· Your doctor will tell you how to  take care of yourself at home. Change your bandage, take your medicines, and limit your activities as told by your doctor. °· Call your doctor if you have symptoms of infection. Get help right away if your belly swells, your cut bleeds a lot, or you have trouble talking or breathing. °This information is not intended to replace advice given to you by your health care provider. Make sure you discuss any questions you have with your health care provider. °Document Released: 08/23/2008 Document Revised: 11/24/2017 Document Reviewed: 11/24/2017 °Elsevier Patient Education © 2020 [employer] °Moderate Conscious Sedation, Adult, Care After °These instructions provide you with information about caring for yourself after your procedure. Your health care provider may also give you more specific instructions. Your treatment has been planned according to current medical practices, but problems sometimes occur. Call your health care provider if you have any problems or questions after your procedure. °What can I expect after the procedure? °After your procedure, it is common: °· To feel sleepy for several hours. °· To feel clumsy and have poor balance for several hours. °· To have poor judgment for several hours. °· To vomit if you eat too soon. °Follow these instructions at home: °For at least 24 hours after the procedure: ° °· Do not: °? Participate in activities where you could fall or become injured. °? Drive. °? Use heavy machinery. °? Drink alcohol. °? Take sleeping pills or medicines that cause drowsiness. °? Make important decisions or sign legal documents. °? Take care of children on your own. °· Rest. °Eating and drinking °· Follow the diet recommended by your health care provider. °· If you vomit: °? Drink water, juice, or soup when you can drink without vomiting. °? Make sure you have little or no nausea before eating solid foods. °General instructions °· Have a responsible adult stay with you until you are awake  and alert. °· Take over-the-counter and prescription medicines only as told by your health care provider. °· If you smoke, do not smoke without supervision. °· Keep all follow-up visits as told by your health care provider. This is important. °Contact a health care provider if: °· You keep feeling nauseous or you keep vomiting. °· You feel light-headed. °· You develop a rash. °· You have a fever. °Get help right away if: °· You have trouble breathing. °This information is not intended to replace advice given to you by your health care provider. Make sure you discuss any questions you have with your health care provider. °Document Released: 09/04/2013 Document Revised: 10/27/2017 Document Reviewed: 03/05/2016 °Elsevier Patient Education © 2020 [employer] ° °

## 2019-06-04 NOTE — Consult Note (Signed)
Chief Complaint: Patient was seen in consultation today for image guided liver lesion biopsy  Referring Physician(s): Feng,Y  Supervising Physician: Corrie Mckusick  Patient Status: [facility] - Out-pt  History of Present Illness: Jeremy Mack is a 73 y.o. male smoker with recent history of abdominal pain, nausea/indigestion and subsequent imaging which revealed widespread hypermetabolic liver lesions along with mediastinal and upper abdominal adenopathy, left upper lobe and right upper lobe pulmonary nodules, bone lesions, indeterminate left adrenal nodule and prostate gland enlargement.  He has no prior history of cancer.  He presents today for image guided liver lesion biopsy for further evaluation.  Past Medical History:  Diagnosis Date   Cancer ([place])    GERD (gastroesophageal reflux disease)    Hyperlipemia    Hypertension     Past Surgical History:  Procedure Laterality Date   arm fracture surgery Right    colon polyps removed     ESOPHAGOGASTRODUODENOSCOPY (EGD) WITH PROPOFOL N/A 06/14/2018   Procedure: ESOPHAGOGASTRODUODENOSCOPY (EGD) WITH PROPOFOL;  Surgeon: [name], MD;  Location: [facility];  Service: Endoscopy;  Laterality: N/A;   EUS N/A 06/14/2018   Procedure: ESOPHAGEAL ENDOSCOPIC ULTRASOUND (EUS) RADIAL;  Surgeon: [name], MD;  Location: [facility];  Service: Endoscopy;  Laterality: N/A;   FINE NEEDLE ASPIRATION N/A 06/14/2018   Procedure: FINE NEEDLE ASPIRATION (FNA) LINEAR;  Surgeon: [name], MD;  Location: [facility];  Service: Endoscopy;  Laterality: N/A;   TONSILLECTOMY      Allergies: Patient has no known allergies.  Medications: Prior to Admission medications   Medication Sig Start Date End Date Taking? Authorizing Provider  amLODipine (NORVASC) 10 MG tablet Take 1 tablet (10 mg total) by mouth daily. 05/24/19  Yes [name], FNP  aspirin-sod bicarb-citric acid (ALKA-SELTZER) 325 MG TBEF tablet Take 650 mg  by mouth every 6 (six) hours as needed.   Yes [provider]  naproxen sodium (ALEVE) 220 MG tablet Take 440 mg by mouth 2 (two) times daily as needed (pain).   Yes [provider]  Omega-3 Fatty Acids (FISH OIL) 1000 MG CAPS Take 1 capsule by mouth daily.   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 file   Food insecurity    Worry: Not on file    Inability: Not on Lexicographer needs    Medical: Not on file    Non-medical: Not on file  Tobacco Use   Smoking status: Current Every Day Smoker    Packs/day: 0.50    Types: Cigars   Smokeless tobacco: Never Used  Substance and Sexual Activity   Alcohol use: Yes    Alcohol/week: 0.0 standard drinks    Comment: Occasionally   Drug use: No   Sexual activity: Not on file  Lifestyle   Physical activity    Days per week: Not on file    Minutes per session: Not on file   Stress: Not on file  Relationships   Social connections    Talks on phone: Not on file  Gets together: Not on file    Attends religious service: Not on file    Active member of club or organization: Not on file    Attends meetings of clubs or organizations: Not on file    Relationship status: Not on file  Other Topics Concern   Not on file  Social History Narrative   Fun: Swimming, and outdoor sports   Denies religious beliefs effecting health care.       Review of Systems currently denies fever, headache, chest pain, dyspnea, cough, back pain, wt loss, night sweats, vomiting or bleeding  Vital Signs: BP (!) 145/99 (BP Location: Right Arm)     Pulse (!) 108    Temp 98.2 F (36.8 C) (Oral)    Resp 18    SpO2 94%   Physical Exam awake, alert.  Chest with distant breath sounds bilaterally.  Heart with regular rate and rhythm.  Abdomen soft, positive bowel sounds, currently nontender.  No lower extremity edema.  Imaging: Nm Pet Image Initial (pi) Skull Base To Thigh  Result Date: 05/28/2019 CLINICAL DATA:  Initial treatment strategy for liver mass. EXAM: NUCLEAR MEDICINE PET SKULL BASE TO THIGH TECHNIQUE: 9.08 mCi F-18 FDG was injected intravenously. Full-ring PET imaging was performed from the skull base to thigh after the radiotracer. CT data was obtained and used for attenuation correction and anatomic localization. Fasting blood glucose: 110 mg/dl COMPARISON:  05/01/2019 FINDINGS: Mediastinal blood pool activity: SUV max 1.9 Liver activity: SUV max NA NECK: No hypermetabolic lymph nodes in the neck. Incidental CT findings: none CHEST: No hypermetabolic axillary or supraclavicular lymph nodes. Enlarged and hypermetabolic right paratracheal lymph nodes identified. Index lymph node measures 1.9 cm with SUV max 11.89. No FDG avid hilar lymph nodes. Hypermetabolic pulmonary nodules identified bilaterally. Index lesion within medial left upper lobe measures 2.6 cm with SUV max of 16.5 to index nodule in the right upper lobe measures 1 cm with SUV max of 6.99. Incidental CT findings: ABDOMEN/PELVIS: Extensive liver metastases are identified involving both lobes of the liver. These lesions are too numerous to count. -4 cm lateral segment left lobe of liver lesion has an SUV max 10.0, image 120/4. -Dome of right liver lesion measures 2.6 cm with SUV max of 7.96. -Posterior right lobe of liver lesion measures 2.9 cm and has SUV max of 10.87. -Central right lobe of liver lesion measures 2.8 cm with SUV max 8.56. No abnormal uptake within the pancreas or spleen. Left adrenal gland nodule measures 2.1 cm with SUV max of 3.74. Not significantly changed in  size from 06/06/2018. Normal right adrenal gland. Normal appearance of both kidneys. Urinary bladder is unremarkable. Asymmetric increased uptake within the left posterior aspect of the prostate gland has an SUV max of 7.7. Portacaval lymph node measures 1.2 cm with SUV max of 8.76. Two FDG avid right retroperitoneal/aortocaval nodes are noted. The largest measures 1.2 cm with SUV max of 11.79. Incidental CT findings: Prostate gland enlargement. Aortic atherosclerosis. SKELETON: Multifocal hypermetabolic bone metastases are identified throughout the proximal appendicular skeleton and axial skeleton. Lesions are too numerous to count. Most of these lesions appear of occult on the corresponding CT images. Index lesion within the base of the dens has an SUV max of 10.35. Small lytic lesion within the C5 vertebra measures 9 mm within SUV max of 6.8 lytic lesion within the L2 vertebra measures 0.9 cm and has an SUV max of 8.91. FDG avid lesion within the right femoral neck without corresponding CT  abnormality has an SUV max of 7.03. CT occult lesion within the left ischium has an SUV max of 14.35. Incidental CT findings: none IMPRESSION: 1. Examination is positive for widespread FDG avid liver metastases. Liver lesions are too numerous to count. 2. Hypermetabolic mediastinal and upper abdominal nodal metastases. 3. Left upper lobe and right upper lobe hypermetabolic pulmonary nodules consistent with pulmonary metastasis. 4. Innumerable liver metastases noted within the axial and proximal appendicular skeleton. Bone lesions are too numerous to count. Some of these have a corresponding lytic lesion on CT images. Others appear occult on the corresponding CT images. 5. Solid hyperdense left adrenal nodule is not significantly changed from 05/01/2019 and exhibits mild FDG uptake. Indeterminate. 6. Prostate gland enlargement. Focal area of increased uptake within the left posterior gland is indeterminate. Electronically Signed    By: Kerby Moors M.D.   On: 05/28/2019 20:14    Labs:  CBC: Recent Labs    06/15/18 0429 06/16/18 0344 07/23/18 1317 04/26/19 1142  WBC 6.3 6.5 9.4 5.5  HGB 11.2* 11.8* 14.4 17.0  HCT 32.8* 35.6* 42.2 48.0  PLT 334 337 189.0 165.0    COAGS: Recent Labs    06/12/18 1552 07/23/18 1317  INR 1.26 1.2*    BMP: Recent Labs    06/13/18 0356 06/14/18 0431 06/15/18 0429 06/16/18 0344 07/23/18 1317 04/26/19 1142  NA 137 138 139 140 138 145  K 4.6 4.6 4.4 4.5 3.8 3.9  CL 105 107 108 108 104 107  CO2 25 25 24 25 25 28   GLUCOSE 109* 94 118* 123* 98 111*  BUN 23 12 11 13 9 10   CALCIUM 8.5* 8.7* 8.7* 8.7* 9.5 9.5  CREATININE 1.11 0.96 0.92 0.83 1.17 1.09  GFRNONAA >60 >60 >60 >60  --   --   GFRAA >60 >60 >60 >60  --   --     LIVER FUNCTION TESTS: Recent Labs    06/13/18 0356 06/14/18 0431 07/23/18 1317 04/26/19 1142  BILITOT 0.6 0.3 1.1 0.7  AST 20 38 11 26  ALT 15 21 9 24   ALKPHOS 78 77 110 174*  PROT 6.8 6.7 7.9 7.5  ALBUMIN 2.4* 2.5* 4.0 4.2    TUMOR MARKERS: No results for input(s): AFPTM, CEA, CA199, CHROMGRNA in the last 8760 hours.  Assessment and Plan: 73 y.o. male smoker with recent history of abdominal pain, nausea/indigestion and subsequent imaging which revealed widespread hypermetabolic liver lesions along with mediastinal and upper abdominal adenopathy, left upper lobe and right upper lobe pulmonary nodules, bone lesions, indeterminate left adrenal nodule and prostate gland enlargement.  He has no prior history of cancer.  He presents today for image guided liver lesion biopsy for further evaluation.Risks and benefits of procedure was discussed with the patient including, but not limited to bleeding, infection, damage to adjacent structures or low yield requiring additional tests.  All of the questions were answered and there is agreement to proceed.  Consent signed and in chart.  LABS PENDING   Thank you for this interesting consult.  I  greatly enjoyed meeting Jeremy Mack and look forward to participating in their care.  A copy of this report was sent to the requesting provider on this date.  Electronically Signed: D. Rowe Robert, PA-C 06/04/2019, 11:53 AM   I spent a total of  25 minutes   in face to face in clinical consultation, greater than 50% of which was counseling/coordinating care for image guided liver lesion biopsy

## 2019-06-06 ENCOUNTER — Telehealth: Payer: Self-pay | Admitting: *Deleted

## 2019-06-06 ENCOUNTER — Inpatient Hospital Stay: Payer: Medicare PPO | Attending: Nurse Practitioner | Admitting: Hematology

## 2019-06-06 ENCOUNTER — Telehealth: Payer: Self-pay

## 2019-06-06 ENCOUNTER — Encounter: Payer: Self-pay | Admitting: Hematology

## 2019-06-06 ENCOUNTER — Telehealth: Payer: Self-pay | Admitting: Hematology

## 2019-06-06 ENCOUNTER — Other Ambulatory Visit: Payer: Self-pay

## 2019-06-06 ENCOUNTER — Inpatient Hospital Stay: Payer: Medicare PPO

## 2019-06-06 VITALS — BP 139/85 | HR 103 | Temp 96.8°F | Resp 17 | Ht 71.0 in | Wt 168.7 lb

## 2019-06-06 DIAGNOSIS — Z8719 Personal history of other diseases of the digestive system: Secondary | ICD-10-CM | POA: Diagnosis not present

## 2019-06-06 DIAGNOSIS — Z5112 Encounter for antineoplastic immunotherapy: Secondary | ICD-10-CM | POA: Diagnosis not present

## 2019-06-06 DIAGNOSIS — C787 Secondary malignant neoplasm of liver and intrahepatic bile duct: Secondary | ICD-10-CM

## 2019-06-06 DIAGNOSIS — I10 Essential (primary) hypertension: Secondary | ICD-10-CM | POA: Diagnosis not present

## 2019-06-06 DIAGNOSIS — Z79899 Other long term (current) drug therapy: Secondary | ICD-10-CM

## 2019-06-06 DIAGNOSIS — R590 Localized enlarged lymph nodes: Secondary | ICD-10-CM | POA: Diagnosis not present

## 2019-06-06 DIAGNOSIS — N320 Bladder-neck obstruction: Secondary | ICD-10-CM | POA: Diagnosis not present

## 2019-06-06 DIAGNOSIS — C3490 Malignant neoplasm of unspecified part of unspecified bronchus or lung: Secondary | ICD-10-CM

## 2019-06-06 DIAGNOSIS — C3412 Malignant neoplasm of upper lobe, left bronchus or lung: Secondary | ICD-10-CM | POA: Insufficient documentation

## 2019-06-06 DIAGNOSIS — D7389 Other diseases of spleen: Secondary | ICD-10-CM | POA: Diagnosis not present

## 2019-06-06 DIAGNOSIS — J439 Emphysema, unspecified: Secondary | ICD-10-CM

## 2019-06-06 DIAGNOSIS — R160 Hepatomegaly, not elsewhere classified: Secondary | ICD-10-CM | POA: Diagnosis not present

## 2019-06-06 DIAGNOSIS — K5730 Diverticulosis of large intestine without perforation or abscess without bleeding: Secondary | ICD-10-CM

## 2019-06-06 DIAGNOSIS — Z7189 Other specified counseling: Secondary | ICD-10-CM

## 2019-06-06 DIAGNOSIS — I700 Atherosclerosis of aorta: Secondary | ICD-10-CM | POA: Diagnosis not present

## 2019-06-06 DIAGNOSIS — E279 Disorder of adrenal gland, unspecified: Secondary | ICD-10-CM

## 2019-06-06 DIAGNOSIS — R740 Nonspecific elevation of levels of transaminase and lactic acid dehydrogenase [LDH]: Secondary | ICD-10-CM | POA: Diagnosis not present

## 2019-06-06 DIAGNOSIS — C7951 Secondary malignant neoplasm of bone: Secondary | ICD-10-CM | POA: Diagnosis not present

## 2019-06-06 DIAGNOSIS — N179 Acute kidney failure, unspecified: Secondary | ICD-10-CM

## 2019-06-06 DIAGNOSIS — Z8639 Personal history of other endocrine, nutritional and metabolic disease: Secondary | ICD-10-CM | POA: Diagnosis not present

## 2019-06-06 DIAGNOSIS — Z5111 Encounter for antineoplastic chemotherapy: Secondary | ICD-10-CM | POA: Diagnosis not present

## 2019-06-06 DIAGNOSIS — K8020 Calculus of gallbladder without cholecystitis without obstruction: Secondary | ICD-10-CM

## 2019-06-06 DIAGNOSIS — C3491 Malignant neoplasm of unspecified part of right bronchus or lung: Secondary | ICD-10-CM | POA: Diagnosis not present

## 2019-06-06 DIAGNOSIS — N400 Enlarged prostate without lower urinary tract symptoms: Secondary | ICD-10-CM

## 2019-06-06 DIAGNOSIS — F17210 Nicotine dependence, cigarettes, uncomplicated: Secondary | ICD-10-CM | POA: Diagnosis not present

## 2019-06-06 DIAGNOSIS — C772 Secondary and unspecified malignant neoplasm of intra-abdominal lymph nodes: Secondary | ICD-10-CM

## 2019-06-06 DIAGNOSIS — E860 Dehydration: Secondary | ICD-10-CM

## 2019-06-06 LAB — BASIC METABOLIC PANEL - CANCER CENTER ONLY
Anion gap: 14 (ref 5–15)
BUN: 15 mg/dL (ref 8–23)
CO2: 23 mmol/L (ref 22–32)
Calcium: 10 mg/dL (ref 8.9–10.3)
Chloride: 103 mmol/L (ref 98–111)
Creatinine: 1.4 mg/dL — ABNORMAL HIGH (ref 0.61–1.24)
GFR, Est AFR Am: 59 mL/min — ABNORMAL LOW (ref >60)
GFR, Estimated: 51 mL/min — ABNORMAL LOW (ref >60)
Glucose, Bld: 102 mg/dL — ABNORMAL HIGH (ref 70–99)
Potassium: 4.7 mmol/L (ref 3.5–5.1)
Sodium: 140 mmol/L (ref 135–145)

## 2019-06-06 LAB — URIC ACID: Uric Acid, Serum: 6.9 mg/dL (ref 3.7–8.6)

## 2019-06-06 MED ORDER — PROCHLORPERAZINE MALEATE 10 MG PO TABS: 10.0000 mg | ORAL_TABLET | Freq: Four times a day (QID) | ORAL | 1 refills | Status: AC | PRN

## 2019-06-06 MED ORDER — SODIUM CHLORIDE 0.9 % IV SOLN
INTRAVENOUS | Status: DC
  Filled 2019-06-06: qty 250

## 2019-06-06 MED ORDER — ALLOPURINOL 300 MG PO TABS: 300.0000 mg | ORAL_TABLET | Freq: Two times a day (BID) | ORAL | 0 refills | Status: DC

## 2019-06-06 MED ORDER — NICOTINE 14 MG/24HR TD PT24: 14.0000 mg | MEDICATED_PATCH | Freq: Every day | TRANSDERMAL | 0 refills | Status: DC

## 2019-06-06 MED ORDER — ONDANSETRON HCL 8 MG PO TABS: 8.0000 mg | ORAL_TABLET | Freq: Two times a day (BID) | ORAL | 1 refills | Status: AC | PRN

## 2019-06-06 NOTE — Telephone Encounter (Signed)
Spoke to patient regarding his appointment that was at 11:30, states he forgot.  He will proceed here now to lab work first and then be seen by [name].

## 2019-06-06 NOTE — Progress Notes (Signed)
START ON PATHWAY REGIMEN - Small Cell Lung     Cycles 1 through 4, every 21 days:     Atezolizumab      Carboplatin      Etoposide    Cycles 5 and beyond, every 21 days:     Atezolizumab   **Always confirm dose/schedule in your pharmacy ordering system**  Patient Characteristics: Newly Diagnosed, Preoperative or Nonsurgical Candidate (Clinical Staging), First Line, Extensive Stage Therapeutic Status: Newly Diagnosed, Preoperative or Nonsurgical Candidate (Clinical Staging) AJCC T Category: cTX AJCC N Category: cN2 AJCC M Category: pM1c AJCC 8 Stage Grouping: IVB Stage Classification: Extensive Intent of Therapy: Non-Curative / Palliative Intent, Discussed with Patient

## 2019-06-06 NOTE — Telephone Encounter (Signed)
Left message for patient to verify telephone visit for pre reg °

## 2019-06-06 NOTE — Telephone Encounter (Signed)
Scheduled appts per 7/9 los. Spoke with patient and he is aware of his appt date and time.

## 2019-06-07 ENCOUNTER — Other Ambulatory Visit: Payer: Self-pay

## 2019-06-07 ENCOUNTER — Inpatient Hospital Stay: Payer: Medicare PPO

## 2019-06-07 ENCOUNTER — Telehealth: Payer: Self-pay | Admitting: *Deleted

## 2019-06-07 VITALS — BP 139/89 | HR 90 | Temp 98.5°F | Resp 16

## 2019-06-07 DIAGNOSIS — E860 Dehydration: Secondary | ICD-10-CM

## 2019-06-07 DIAGNOSIS — Z5112 Encounter for antineoplastic immunotherapy: Secondary | ICD-10-CM | POA: Diagnosis not present

## 2019-06-07 MED ORDER — SODIUM CHLORIDE 0.9 % IV SOLN
Freq: Once | INTRAVENOUS | Status: AC
  Administered 2019-06-07: 09:00:00 via INTRAVENOUS
  Filled 2019-06-07: qty 250

## 2019-06-07 NOTE — Patient Instructions (Signed)
Dehydration, Adult  Dehydration is when there is not enough fluid or water in your body. This happens when you lose more fluids than you take in. Dehydration can range from mild to very bad. It should be treated right away to keep it from getting very bad. Symptoms of mild dehydration may include:  Thirst.  Dry lips.  Slightly dry mouth.  Dry, warm skin.  Dizziness. Symptoms of moderate dehydration may include:  Very dry mouth.  Muscle cramps.  Dark pee (urine). Pee may be the color of tea.  Your body making less pee.  Your eyes making fewer tears.  Heartbeat that is uneven or faster than normal (palpitations).  Headache.  Light-headedness, especially when you stand up from sitting.  Fainting (syncope). Symptoms of very bad dehydration may include:  Changes in skin, such as: ? Cold and clammy skin. ? Blotchy (mottled) or pale skin. ? Skin that does not quickly return to normal after being lightly pinched and let go (poor skin turgor).  Changes in body fluids, such as: ? Feeling very thirsty. ? Your eyes making fewer tears. ? Not sweating when body temperature is high, such as in hot weather. ? Your body making very little pee.  Changes in vital signs, such as: ? Weak pulse. ? Pulse that is more than 100 beats a minute when you are sitting still. ? Fast breathing. ? Low blood pressure.  Other changes, such as: ? Sunken eyes. ? Cold hands and feet. ? Confusion. ? Lack of energy (lethargy). ? Trouble waking up from sleep. ? Short-term weight loss. ? Unconsciousness. Follow these instructions at home:   If told by your doctor, drink an ORS: ? Make an ORS by using instructions on the package. ? Start by drinking small amounts, about  cup (120 mL) every 5-10 minutes. ? Slowly drink more until you have had the amount that your doctor said to have.  Drink enough clear fluid to keep your pee clear or pale yellow. If you were told to drink an ORS, finish the  ORS first, then start slowly drinking clear fluids. Drink fluids such as: ? Water. Do not drink only water by itself. Doing that can make the salt (sodium) level in your body get too low (hyponatremia). ? Ice chips. ? Fruit juice that you have added water to (diluted). ? Low-calorie sports drinks.  Avoid: ? Alcohol. ? Drinks that have a lot of sugar. These include high-calorie sports drinks, fruit juice that does not have water added, and soda. ? Caffeine. ? Foods that are greasy or have a lot of fat or sugar.  Take over-the-counter and prescription medicines only as told by your doctor.  Do not take salt tablets. Doing that can make the salt level in your body get too high (hypernatremia).  Eat foods that have minerals (electrolytes). Examples include bananas, oranges, potatoes, tomatoes, and spinach.  Keep all follow-up visits as told by your doctor. This is important. Contact a doctor if:  You have belly (abdominal) pain that: ? Gets worse. ? Stays in one area (localizes).  You have a rash.  You have a stiff neck.  You get angry or annoyed more easily than normal (irritability).  You are more sleepy than normal.  You have a harder time waking up than normal.  You feel: ? Weak. ? Dizzy. ? Very thirsty.  You have peed (urinated) only a small amount of very dark pee during 6-8 hours. Get help right away if:  You have   symptoms of very bad dehydration.  You cannot drink fluids without throwing up (vomiting).  Your symptoms get worse with treatment.  You have a fever.  You have a very bad headache.  You are throwing up or having watery poop (diarrhea) and it: ? Gets worse. ? Does not go away.  You have blood or something green (bile) in your throw-up.  You have blood in your poop (stool). This may cause poop to look black and tarry.  You have not peed in 6-8 hours.  You pass out (faint).  Your heart rate when you are sitting still is more than 100 beats a  minute.  You have trouble breathing. This information is not intended to replace advice given to you by your health care provider. Make sure you discuss any questions you have with your health care provider. Document Released: 09/10/2009 Document Revised: 10/27/2017 Document Reviewed: 01/08/2016 Elsevier Patient Education  2020 [employer]  

## 2019-06-10 ENCOUNTER — Other Ambulatory Visit: Payer: Self-pay | Admitting: Hematology

## 2019-06-10 ENCOUNTER — Encounter: Payer: Self-pay | Admitting: Hematology

## 2019-06-10 ENCOUNTER — Inpatient Hospital Stay: Payer: Medicare PPO

## 2019-06-10 ENCOUNTER — Other Ambulatory Visit: Payer: Self-pay

## 2019-06-10 ENCOUNTER — Telehealth: Payer: Self-pay | Admitting: Hematology

## 2019-06-10 VITALS — BP 115/82 | HR 105 | Temp 98.5°F | Resp 18

## 2019-06-10 DIAGNOSIS — C3490 Malignant neoplasm of unspecified part of unspecified bronchus or lung: Secondary | ICD-10-CM

## 2019-06-10 DIAGNOSIS — Z7189 Other specified counseling: Secondary | ICD-10-CM

## 2019-06-10 DIAGNOSIS — Z5112 Encounter for antineoplastic immunotherapy: Secondary | ICD-10-CM | POA: Diagnosis not present

## 2019-06-10 LAB — CBC WITH DIFFERENTIAL (CANCER CENTER ONLY)
Abs Immature Granulocytes: 0.03 10*3/uL (ref 0.00–0.07)
Basophils Absolute: 0.1 10*3/uL (ref 0.0–0.1)
Basophils Relative: 1 %
Eosinophils Absolute: 0 10*3/uL (ref 0.0–0.5)
Eosinophils Relative: 0 %
HCT: 52.8 % — ABNORMAL HIGH (ref 39.0–52.0)
Hemoglobin: 17.9 g/dL — ABNORMAL HIGH (ref 13.0–17.0)
Immature Granulocytes: 0 %
Lymphocytes Relative: 24 %
Lymphs Abs: 2 10*3/uL (ref 0.7–4.0)
MCH: 32.6 pg (ref 26.0–34.0)
MCHC: 33.9 g/dL (ref 30.0–36.0)
MCV: 96.2 fL (ref 80.0–100.0)
Monocytes Absolute: 0.7 10*3/uL (ref 0.1–1.0)
Monocytes Relative: 8 %
Neutro Abs: 5.4 10*3/uL (ref 1.7–7.7)
Neutrophils Relative %: 67 %
Platelet Count: 217 10*3/uL (ref 150–400)
RBC: 5.49 MIL/uL (ref 4.22–5.81)
RDW: 13.2 % (ref 11.5–15.5)
WBC Count: 8.2 10*3/uL (ref 4.0–10.5)
nRBC: 0 % (ref 0.0–0.2)

## 2019-06-10 LAB — CMP (CANCER CENTER ONLY)
ALT: 85 U/L — ABNORMAL HIGH (ref 0–44)
AST: 67 U/L — ABNORMAL HIGH (ref 15–41)
Albumin: 3.6 g/dL (ref 3.5–5.0)
Alkaline Phosphatase: 308 U/L — ABNORMAL HIGH (ref 38–126)
Anion gap: 13 (ref 5–15)
BUN: 19 mg/dL (ref 8–23)
CO2: 22 mmol/L (ref 22–32)
Calcium: 10.6 mg/dL — ABNORMAL HIGH (ref 8.9–10.3)
Chloride: 102 mmol/L (ref 98–111)
Creatinine: 1.77 mg/dL — ABNORMAL HIGH (ref 0.61–1.24)
GFR, Est AFR Am: 44 mL/min — ABNORMAL LOW (ref >60)
GFR, Estimated: 38 mL/min — ABNORMAL LOW (ref >60)
Glucose, Bld: 119 mg/dL — ABNORMAL HIGH (ref 70–99)
Potassium: 4.8 mmol/L (ref 3.5–5.1)
Sodium: 137 mmol/L (ref 135–145)
Total Bilirubin: 0.6 mg/dL (ref 0.3–1.2)
Total Protein: 8.6 g/dL — ABNORMAL HIGH (ref 6.5–8.1)

## 2019-06-10 LAB — TSH: TSH: 1.559 u[IU]/mL (ref 0.320–4.118)

## 2019-06-10 MED ORDER — SODIUM CHLORIDE 0.9 % IV SOLN
100.0000 mg/m2 | Freq: Once | INTRAVENOUS | Status: AC
  Administered 2019-06-10: 18:00:00 200 mg via INTRAVENOUS
  Filled 2019-06-10: qty 10

## 2019-06-10 MED ORDER — SODIUM CHLORIDE 0.9 % IV SOLN
Freq: Once | INTRAVENOUS | Status: AC
  Administered 2019-06-10: 14:00:00 via INTRAVENOUS
  Filled 2019-06-10: qty 5

## 2019-06-10 MED ORDER — SODIUM CHLORIDE 0.9 % IV SOLN
Freq: Once | INTRAVENOUS | Status: AC
  Administered 2019-06-10: 14:00:00 via INTRAVENOUS
  Filled 2019-06-10: qty 250

## 2019-06-10 MED ORDER — PALONOSETRON HCL INJECTION 0.25 MG/5ML
INTRAVENOUS | Status: AC
  Filled 2019-06-10: qty 5

## 2019-06-10 MED ORDER — SODIUM CHLORIDE 0.9 % IV SOLN
1200.0000 mg | Freq: Once | INTRAVENOUS | Status: AC
  Administered 2019-06-10: 1200 mg via INTRAVENOUS
  Filled 2019-06-10: qty 20

## 2019-06-10 MED ORDER — PALONOSETRON HCL INJECTION 0.25 MG/5ML
0.2500 mg | Freq: Once | INTRAVENOUS | Status: AC
  Administered 2019-06-10: 14:00:00 0.25 mg via INTRAVENOUS

## 2019-06-10 MED ORDER — SODIUM CHLORIDE 0.9 % IV SOLN
340.5000 mg | Freq: Once | INTRAVENOUS | Status: AC
  Administered 2019-06-10: 17:00:00 340 mg via INTRAVENOUS
  Filled 2019-06-10: qty 34

## 2019-06-10 MED ORDER — SODIUM CHLORIDE 0.9 % IV SOLN
INTRAVENOUS | Status: AC
  Administered 2019-06-10: 15:00:00 via INTRAVENOUS
  Filled 2019-06-10 (×2): qty 250

## 2019-06-10 NOTE — Progress Notes (Signed)
Met with patient at registration to introduce myself as Arboriculturist and to offer available resources.  There are no available copay assistance programs for his diagnosis.  Discussed the one-time $78 [place] and qualifications to assist with personal expenses while going through treatment.  Advised to contact me if interested in applying. Gave him my card with grant information on it and for any additional financial questions or concerns.

## 2019-06-10 NOTE — Patient Instructions (Signed)
[facility] Discharge Instructions for Patients Receiving Chemotherapy  Today you received the following chemotherapy agents: Tecentriq, Carboplatin, and Etoposide   To help prevent nausea and vomiting after your treatment, we encourage you to take your nausea medication as directed.    If you develop nausea and vomiting that is not controlled by your nausea medication, call the clinic.   BELOW ARE SYMPTOMS THAT SHOULD BE REPORTED IMMEDIATELY:  *FEVER GREATER THAN 100.5 F  *CHILLS WITH OR WITHOUT FEVER  NAUSEA AND VOMITING THAT IS NOT CONTROLLED WITH YOUR NAUSEA MEDICATION  *UNUSUAL SHORTNESS OF BREATH  *UNUSUAL BRUISING OR BLEEDING  TENDERNESS IN MOUTH AND THROAT WITH OR WITHOUT PRESENCE OF ULCERS  *URINARY PROBLEMS  *BOWEL PROBLEMS  UNUSUAL RASH Items with * indicate a potential emergency and should be followed up as soon as possible.  Feel free to call the clinic should you have any questions or concerns. The clinic phone number is [phone].  Please show the [place] at check-in to the Emergency Department and triage nurse.  Atezolizumab injection What is this medicine? ATEZOLIZUMAB (a te zoe LIZ ue mab) is a monoclonal antibody. It is used to treat bladder cancer (urothelial cancer), non-small cell lung cancer, small cell lung cancer, and breast cancer. This medicine may be used for other purposes; ask your health care provider or pharmacist if you have questions. COMMON BRAND NAME(S): Tecentriq What should I tell my health care provider before I take this medicine? They need to know if you have any of these conditions:  diabetes  immune system problems  infection  inflammatory bowel disease  liver disease  lung or breathing disease  lupus  nervous system problems like myasthenia gravis or Guillain-Barre syndrome  organ transplant  an unusual or allergic reaction to atezolizumab, other medicines, foods, dyes, or  preservatives  pregnant or trying to get pregnant  breast-feeding How should I use this medicine? This medicine is for infusion into a vein. It is given by a health care professional in a hospital or clinic setting. A special MedGuide will be given to you before each treatment. Be sure to read this information carefully each time. Talk to your pediatrician regarding the use of this medicine in children. Special care may be needed. Overdosage: If you think you have taken too much of this medicine contact a poison control center or emergency room at once. NOTE: This medicine is only for you. Do not share this medicine with others. What if I miss a dose? It is important not to miss your dose. Call your doctor or health care professional if you are unable to keep an appointment. What may interact with this medicine? Interactions have not been studied. This list may not describe all possible interactions. Give your health care provider a list of all the medicines, herbs, non-prescription drugs, or dietary supplements you use. Also tell them if you smoke, drink alcohol, or use illegal drugs. Some items may interact with your medicine. What should I watch for while using this medicine? Your condition will be monitored carefully while you are receiving this medicine. You may need blood work done while you are taking this medicine. Do not become pregnant while taking this medicine or for at least 5 months after stopping it. Women should inform their doctor if they wish to become pregnant or think they might be pregnant. There is a potential for serious side effects to an unborn child. Talk to your health care professional or pharmacist for  more information. Do not breast-feed an infant while taking this medicine or for at least 5 months after the last dose. What side effects may I notice from receiving this medicine? Side effects that you should report to your doctor or health care professional as soon  as possible:  allergic reactions like skin rash, itching or hives, swelling of the face, lips, or tongue  black, tarry stools  bloody or watery diarrhea  breathing problems  changes in vision  chest pain or chest tightness  chills  facial flushing  fever  headache  signs and symptoms of high blood sugar such as dizziness; dry mouth; dry skin; fruity breath; nausea; stomach pain; increased hunger or thirst; increased urination  signs and symptoms of liver injury like dark yellow or brown urine; general ill feeling or flu-like symptoms; light-colored stools; loss of appetite; nausea; right upper belly pain; unusually weak or tired; yellowing of the eyes or skin  stomach pain  trouble passing urine or change in the amount of urine Side effects that usually do not require medical attention (report to your doctor or health care professional if they continue or are bothersome):  cough  diarrhea  joint pain  muscle pain  muscle weakness  tiredness  weight loss This list may not describe all possible side effects. Call your doctor for medical advice about side effects. You may report side effects to FDA at 1-800-FDA-1088. Where should I keep my medicine? This drug is given in a hospital or clinic and will not be stored at home. NOTE: This sheet is a summary. It may not cover all possible information. If you have questions about this medicine, talk to your doctor, pharmacist, or health care provider.  2020 Elsevier/Gold Standard (2018-02-16 09:33:38)  Carboplatin injection What is this medicine? CARBOPLATIN (KAR boe pla tin) is a chemotherapy drug. It targets fast dividing cells, like cancer cells, and causes these cells to die. This medicine is used to treat ovarian cancer and many other cancers. This medicine may be used for other purposes; ask your health care provider or pharmacist if you have questions. COMMON BRAND NAME(S): Paraplatin What should I tell my health  care provider before I take this medicine? They need to know if you have any of these conditions:  blood disorders  hearing problems  kidney disease  recent or ongoing radiation therapy  an unusual or allergic reaction to carboplatin, cisplatin, other chemotherapy, other medicines, foods, dyes, or preservatives  pregnant or trying to get pregnant  breast-feeding How should I use this medicine? This drug is usually given as an infusion into a vein. It is administered in a hospital or clinic by a specially trained health care professional. Talk to your pediatrician regarding the use of this medicine in children. Special care may be needed. Overdosage: If you think you have taken too much of this medicine contact a poison control center or emergency room at once. NOTE: This medicine is only for you. Do not share this medicine with others. What if I miss a dose? It is important not to miss a dose. Call your doctor or health care professional if you are unable to keep an appointment. What may interact with this medicine?  medicines for seizures  medicines to increase blood counts like filgrastim, pegfilgrastim, sargramostim  some antibiotics like amikacin, gentamicin, neomycin, streptomycin, tobramycin  vaccines Talk to your doctor or health care professional before taking any of these medicines:  acetaminophen  aspirin  ibuprofen  ketoprofen  naproxen  This list may not describe all possible interactions. Give your health care provider a list of all the medicines, herbs, non-prescription drugs, or dietary supplements you use. Also tell them if you smoke, drink alcohol, or use illegal drugs. Some items may interact with your medicine. What should I watch for while using this medicine? Your condition will be monitored carefully while you are receiving this medicine. You will need important blood work done while you are taking this medicine. This drug may make you feel generally  unwell. This is not uncommon, as chemotherapy can affect healthy cells as well as cancer cells. Report any side effects. Continue your course of treatment even though you feel ill unless your doctor tells you to stop. In some cases, you may be given additional medicines to help with side effects. Follow all directions for their use. Call your doctor or health care professional for advice if you get a fever, chills or sore throat, or other symptoms of a cold or flu. Do not treat yourself. This drug decreases your body's ability to fight infections. Try to avoid being around people who are sick. This medicine may increase your risk to bruise or bleed. Call your doctor or health care professional if you notice any unusual bleeding. Be careful brushing and flossing your teeth or using a toothpick because you may get an infection or bleed more easily. If you have any dental work done, tell your dentist you are receiving this medicine. Avoid taking products that contain aspirin, acetaminophen, ibuprofen, naproxen, or ketoprofen unless instructed by your doctor. These medicines may hide a fever. Do not become pregnant while taking this medicine. Women should inform their doctor if they wish to become pregnant or think they might be pregnant. There is a potential for serious side effects to an unborn child. Talk to your health care professional or pharmacist for more information. Do not breast-feed an infant while taking this medicine. What side effects may I notice from receiving this medicine? Side effects that you should report to your doctor or health care professional as soon as possible:  allergic reactions like skin rash, itching or hives, swelling of the face, lips, or tongue  signs of infection - fever or chills, cough, sore throat, pain or difficulty passing urine  signs of decreased platelets or bleeding - bruising, pinpoint red spots on the skin, black, tarry stools, nosebleeds  signs of  decreased red blood cells - unusually weak or tired, fainting spells, lightheadedness  breathing problems  changes in hearing  changes in vision  chest pain  high blood pressure  low blood counts - This drug may decrease the number of white blood cells, red blood cells and platelets. You may be at increased risk for infections and bleeding.  nausea and vomiting  pain, swelling, redness or irritation at the injection site  pain, tingling, numbness in the hands or feet  problems with balance, talking, walking  trouble passing urine or change in the amount of urine Side effects that usually do not require medical attention (report to your doctor or health care professional if they continue or are bothersome):  hair loss  loss of appetite  metallic taste in the mouth or changes in taste This list may not describe all possible side effects. Call your doctor for medical advice about side effects. You may report side effects to FDA at 1-800-FDA-1088. Where should I keep my medicine? This drug is given in a hospital or clinic and will not be stored  at home. NOTE: This sheet is a summary. It may not cover all possible information. If you have questions about this medicine, talk to your doctor, pharmacist, or health care provider.  2020 Elsevier/Gold Standard (2008-02-19 14:38:05)  Etoposide, VP-16 injection What is this medicine? ETOPOSIDE, VP-16 (e toe POE side) is a chemotherapy drug. It is used to treat testicular cancer, lung cancer, and other cancers. This medicine may be used for other purposes; ask your health care provider or pharmacist if you have questions. COMMON BRAND NAME(S): Etopophos, Toposar, VePesid What should I tell my health care provider before I take this medicine? They need to know if you have any of these conditions:  infection  kidney disease  liver disease  low blood counts, like low white cell, platelet, or red cell counts  an unusual or allergic  reaction to etoposide, other medicines, foods, dyes, or preservatives  pregnant or trying to get pregnant  breast-feeding How should I use this medicine? This medicine is for infusion into a vein. It is administered in a hospital or clinic by a specially trained health care professional. Talk to your pediatrician regarding the use of this medicine in children. Special care may be needed. Overdosage: If you think you have taken too much of this medicine contact a poison control center or emergency room at once. NOTE: This medicine is only for you. Do not share this medicine with others. What if I miss a dose? It is important not to miss your dose. Call your doctor or health care professional if you are unable to keep an appointment. What may interact with this medicine?  aspirin  certain medications for seizures like carbamazepine, phenobarbital, phenytoin, valproic acid  cyclosporine  levamisole  warfarin This list may not describe all possible interactions. Give your health care provider a list of all the medicines, herbs, non-prescription drugs, or dietary supplements you use. Also tell them if you smoke, drink alcohol, or use illegal drugs. Some items may interact with your medicine. What should I watch for while using this medicine? Visit your doctor for checks on your progress. This drug may make you feel generally unwell. This is not uncommon, as chemotherapy can affect healthy cells as well as cancer cells. Report any side effects. Continue your course of treatment even though you feel ill unless your doctor tells you to stop. In some cases, you may be given additional medicines to help with side effects. Follow all directions for their use. Call your doctor or health care professional for advice if you get a fever, chills or sore throat, or other symptoms of a cold or flu. Do not treat yourself. This drug decreases your body's ability to fight infections. Try to avoid being around  people who are sick. This medicine may increase your risk to bruise or bleed. Call your doctor or health care professional if you notice any unusual bleeding. Talk to your doctor about your risk of cancer. You may be more at risk for certain types of cancers if you take this medicine. Do not become pregnant while taking this medicine or for at least 6 months after stopping it. Women should inform their doctor if they wish to become pregnant or think they might be pregnant. Women of child-bearing potential will need to have a negative pregnancy test before starting this medicine. There is a potential for serious side effects to an unborn child. Talk to your health care professional or pharmacist for more information. Do not breast-feed an infant while  taking this medicine. Men must use a latex condom during sexual contact with a woman while taking this medicine and for at least 4 months after stopping it. A latex condom is needed even if you have had a vasectomy. Contact your doctor right away if your partner becomes pregnant. Do not donate sperm while taking this medicine and for at least 4 months after you stop taking this medicine. Men should inform their doctors if they wish to father a child. This medicine may lower sperm counts. What side effects may I notice from receiving this medicine? Side effects that you should report to your doctor or health care professional as soon as possible:  allergic reactions like skin rash, itching or hives, swelling of the face, lips, or tongue  low blood counts - this medicine may decrease the number of white blood cells, red blood cells and platelets. You may be at increased risk for infections and bleeding.  signs of infection - fever or chills, cough, sore throat, pain or difficulty passing urine  signs of decreased platelets or bleeding - bruising, pinpoint red spots on the skin, black, tarry stools, blood in the urine  signs of decreased red blood cells -  unusually weak or tired, fainting spells, lightheadedness  breathing problems  changes in vision  mouth or throat sores or ulcers  pain, redness, swelling or irritation at the injection site  pain, tingling, numbness in the hands or feet  redness, blistering, peeling or loosening of the skin, including inside the mouth  seizures  vomiting Side effects that usually do not require medical attention (report to your doctor or health care professional if they continue or are bothersome):  diarrhea  hair loss  loss of appetite  nausea  stomach pain This list may not describe all possible side effects. Call your doctor for medical advice about side effects. You may report side effects to FDA at 1-800-FDA-1088. Where should I keep my medicine? This drug is given in a hospital or clinic and will not be stored at home. NOTE: This sheet is a summary. It may not cover all possible information. If you have questions about this medicine, talk to your doctor, pharmacist, or health care provider.  2020 Elsevier/Gold Standard (2015-11-06 11:53:23)

## 2019-06-10 NOTE — Progress Notes (Signed)
Cr of 1.77 and pulse 105 reported to [name]. Received OK to treat.

## 2019-06-10 NOTE — Telephone Encounter (Signed)
Per 7/13 schedule message added ivf to tx 7/14 and 7/15. Treatment duration extended both days - ok per inf AD.

## 2019-06-11 ENCOUNTER — Inpatient Hospital Stay: Payer: Medicare PPO

## 2019-06-11 ENCOUNTER — Encounter: Payer: Self-pay | Admitting: Hematology

## 2019-06-11 ENCOUNTER — Other Ambulatory Visit: Payer: Self-pay

## 2019-06-11 ENCOUNTER — Other Ambulatory Visit: Payer: Self-pay | Admitting: Nurse Practitioner

## 2019-06-11 VITALS — BP 135/87 | HR 89 | Temp 98.3°F | Resp 16

## 2019-06-11 DIAGNOSIS — Z7189 Other specified counseling: Secondary | ICD-10-CM

## 2019-06-11 DIAGNOSIS — C3490 Malignant neoplasm of unspecified part of unspecified bronchus or lung: Secondary | ICD-10-CM

## 2019-06-11 DIAGNOSIS — Z5112 Encounter for antineoplastic immunotherapy: Secondary | ICD-10-CM | POA: Diagnosis not present

## 2019-06-11 MED ORDER — SODIUM CHLORIDE 0.9 % IV SOLN
Freq: Once | INTRAVENOUS | Status: AC
  Administered 2019-06-11: 14:00:00 via INTRAVENOUS
  Filled 2019-06-11: qty 250

## 2019-06-11 MED ORDER — SODIUM CHLORIDE 0.9 % IV SOLN
INTRAVENOUS | Status: AC
  Administered 2019-06-11: 15:00:00 via INTRAVENOUS
  Filled 2019-06-11 (×2): qty 250

## 2019-06-11 MED ORDER — DEXAMETHASONE SODIUM PHOSPHATE 10 MG/ML IJ SOLN
10.0000 mg | Freq: Once | INTRAMUSCULAR | Status: AC
  Administered 2019-06-11: 10 mg via INTRAVENOUS

## 2019-06-11 MED ORDER — SODIUM CHLORIDE 0.9 % IV SOLN: 10.0000 mg | Freq: Once | INTRAVENOUS | Status: DC

## 2019-06-11 MED ORDER — PANTOPRAZOLE SODIUM 20 MG PO TBEC: 20.0000 mg | DELAYED_RELEASE_TABLET | Freq: Every day | ORAL | 0 refills | Status: DC

## 2019-06-11 MED ORDER — DEXAMETHASONE SODIUM PHOSPHATE 10 MG/ML IJ SOLN
INTRAMUSCULAR | Status: AC
  Filled 2019-06-11: qty 1

## 2019-06-11 MED ORDER — SODIUM CHLORIDE 0.9 % IV SOLN
100.0000 mg/m2 | Freq: Once | INTRAVENOUS | Status: AC
  Administered 2019-06-11: 200 mg via INTRAVENOUS
  Filled 2019-06-11: qty 10

## 2019-06-11 NOTE — Patient Instructions (Signed)
[facility] Discharge Instructions for Patients Receiving Chemotherapy  Today you received the following chemotherapy agents Etoposide  To help prevent nausea and vomiting after your treatment, we encourage you to take your nausea medication as directed by your MD.   If you develop nausea and vomiting that is not controlled by your nausea medication, call the clinic.   BELOW ARE SYMPTOMS THAT SHOULD BE REPORTED IMMEDIATELY:  *FEVER GREATER THAN 100.5 F  *CHILLS WITH OR WITHOUT FEVER  NAUSEA AND VOMITING THAT IS NOT CONTROLLED WITH YOUR NAUSEA MEDICATION  *UNUSUAL SHORTNESS OF BREATH  *UNUSUAL BRUISING OR BLEEDING  TENDERNESS IN MOUTH AND THROAT WITH OR WITHOUT PRESENCE OF ULCERS  *URINARY PROBLEMS  *BOWEL PROBLEMS  UNUSUAL RASH Items with * indicate a potential emergency and should be followed up as soon as possible.  Feel free to call the clinic should you have any questions or concerns. The clinic phone number is [phone].  Please show the [place] at check-in to the Emergency Department and triage nurse.  Coronavirus (COVID-19) Are you at risk?  Are you at risk for the Coronavirus (COVID-19)?  To be considered HIGH RISK for Coronavirus (COVID-19), you have to meet the following criteria:  . Traveled to [place], [place], [place], [place] or [place]; or in the [place] to [place], [place], [place], or [place]; and have fever, cough, and shortness of breath within the last 2 weeks of travel OR . Been in close contact with a person diagnosed with COVID-19 within the last 2 weeks and have fever, cough, and shortness of breath . IF YOU DO NOT MEET THESE CRITERIA, YOU ARE CONSIDERED LOW RISK FOR COVID-19.  What to do if you are HIGH RISK for COVID-19?  Marland Kitchen If you are having a medical emergency, call 911. . Seek medical care right away. Before you go to a doctor's office, urgent care or emergency department, call ahead and tell them about  your recent travel, contact with someone diagnosed with COVID-19, and your symptoms. You should receive instructions from your physician's office regarding next steps of care.  . When you arrive at healthcare provider, tell the healthcare staff immediately you have returned from visiting [place], [place], [place], [place] or [place]; or traveled in the [place] to [place], [place], [place], or [place]; in the last two weeks or you have been in close contact with a person diagnosed with COVID-19 in the last 2 weeks.   . Tell the health care staff about your symptoms: fever, cough and shortness of breath. . After you have been seen by a medical provider, you will be either: o Tested for (COVID-19) and discharged home on quarantine except to seek medical care if symptoms worsen, and asked to  - Stay home and avoid contact with others until you get your results (4-5 days)  - Avoid travel on public transportation if possible (such as bus, train, or airplane) or o Sent to the Emergency Department by EMS for evaluation, COVID-19 testing, and possible admission depending on your condition and test results.  What to do if you are LOW RISK for COVID-19?  Reduce your risk of any infection by using the same precautions used for avoiding the common cold or flu:  Marland Kitchen Wash your hands often with soap and warm water for at least 20 seconds.  If soap and water are not readily available, use an alcohol-based hand sanitizer with at least 60% alcohol.  . If  coughing or sneezing, cover your mouth and nose by coughing or sneezing into the elbow areas of your shirt or coat, into a tissue or into your sleeve (not your hands). . Avoid shaking hands with others and consider head nods or verbal greetings only. . Avoid touching your eyes, nose, or mouth with unwashed hands.  . Avoid close contact with people who are sick. . Avoid places or events with large numbers of people in one location, like concerts or sporting  events. . Carefully consider travel plans you have or are making. . If you are planning any travel outside or inside the [place], visit the CDC's Travelers' Health webpage for the latest health notices. . If you have some symptoms but not all symptoms, continue to monitor at home and seek medical attention if your symptoms worsen. . If you are having a medical emergency, call 911.   [place] / e-Visit: [email or web address]         [facility]: [facility]: [phone]                   [facility]: [phone]

## 2019-06-11 NOTE — Progress Notes (Signed)
Patient's spouse called regarding our conversation yesterday about the $700 grant.  Advised what is needed to apply. She verbalized understanding and will give them to him for tomorrow's appointment.  They have my card for any additional financial questions or concerns.

## 2019-06-12 ENCOUNTER — Other Ambulatory Visit: Payer: Self-pay

## 2019-06-12 ENCOUNTER — Inpatient Hospital Stay: Payer: Medicare PPO

## 2019-06-12 ENCOUNTER — Encounter: Payer: Self-pay | Admitting: Hematology

## 2019-06-12 VITALS — BP 161/85 | HR 88 | Temp 97.7°F | Resp 18

## 2019-06-12 DIAGNOSIS — Z7189 Other specified counseling: Secondary | ICD-10-CM

## 2019-06-12 DIAGNOSIS — Z5112 Encounter for antineoplastic immunotherapy: Secondary | ICD-10-CM | POA: Diagnosis not present

## 2019-06-12 DIAGNOSIS — C3490 Malignant neoplasm of unspecified part of unspecified bronchus or lung: Secondary | ICD-10-CM

## 2019-06-12 MED ORDER — DEXAMETHASONE SODIUM PHOSPHATE 10 MG/ML IJ SOLN
10.0000 mg | Freq: Once | INTRAMUSCULAR | Status: AC
  Administered 2019-06-12: 09:00:00 10 mg via INTRAVENOUS

## 2019-06-12 MED ORDER — DEXAMETHASONE SODIUM PHOSPHATE 10 MG/ML IJ SOLN
INTRAMUSCULAR | Status: AC
  Filled 2019-06-12: qty 1

## 2019-06-12 MED ORDER — SODIUM CHLORIDE 0.9 % IV SOLN
Freq: Once | INTRAVENOUS | Status: AC
  Administered 2019-06-12: 09:00:00 via INTRAVENOUS
  Filled 2019-06-12: qty 250

## 2019-06-12 MED ORDER — SODIUM CHLORIDE 0.9 % IV SOLN
100.0000 mg/m2 | Freq: Once | INTRAVENOUS | Status: AC
  Administered 2019-06-12: 10:00:00 200 mg via INTRAVENOUS
  Filled 2019-06-12: qty 10

## 2019-06-12 MED ORDER — SODIUM CHLORIDE 0.9 % IV SOLN
INTRAVENOUS | Status: AC
  Administered 2019-06-12: 09:00:00 via INTRAVENOUS
  Filled 2019-06-12 (×2): qty 250

## 2019-06-12 NOTE — Progress Notes (Signed)
Notified [name] for clarification re: IV fluids. States to give 1L over 2 hours.

## 2019-06-12 NOTE — Patient Instructions (Signed)
[facility] Discharge Instructions for Patients Receiving Chemotherapy  Today you received the following chemotherapy agents: Etoposide   To help prevent nausea and vomiting after your treatment, we encourage you to take your nausea medication as directed.    If you develop nausea and vomiting that is not controlled by your nausea medication, call the clinic.   BELOW ARE SYMPTOMS THAT SHOULD BE REPORTED IMMEDIATELY:  *FEVER GREATER THAN 100.5 F  *CHILLS WITH OR WITHOUT FEVER  NAUSEA AND VOMITING THAT IS NOT CONTROLLED WITH YOUR NAUSEA MEDICATION  *UNUSUAL SHORTNESS OF BREATH  *UNUSUAL BRUISING OR BLEEDING  TENDERNESS IN MOUTH AND THROAT WITH OR WITHOUT PRESENCE OF ULCERS  *URINARY PROBLEMS  *BOWEL PROBLEMS  UNUSUAL RASH Items with * indicate a potential emergency and should be followed up as soon as possible.  Feel free to call the clinic should you have any questions or concerns. The clinic phone number is [phone].  Please show the [place] at check-in to the Emergency Department and triage nurse.

## 2019-06-12 NOTE — Progress Notes (Signed)
Met with patient at registration to sign grant approval letter.  Patient approved for the one-time $700 [place] to assist with personal expenses while going through treatment. Patient was given an expense sheet for what the grant covers along with the Outpatient pharmacy information.  He has my card for any additional financial questions or concerns.

## 2019-06-14 ENCOUNTER — Telehealth: Payer: Self-pay | Admitting: Hematology

## 2019-06-14 ENCOUNTER — Ambulatory Visit (HOSPITAL_COMMUNITY)
Admission: RE | Admit: 2019-06-14 | Discharge: 2019-06-14 | Disposition: A | Discharge status: Home / Self Care | Payer: Medicare PPO | Source: Ambulatory Visit | Attending: Hematology | Admitting: Hematology

## 2019-06-14 ENCOUNTER — Other Ambulatory Visit: Payer: Self-pay

## 2019-06-14 DIAGNOSIS — C3490 Malignant neoplasm of unspecified part of unspecified bronchus or lung: Secondary | ICD-10-CM

## 2019-06-14 MED ORDER — GADOBUTROL 1 MMOL/ML IV SOLN
7.0000 mL | Freq: Once | INTRAVENOUS | Status: AC | PRN
  Administered 2019-06-14: 7 mL via INTRAVENOUS

## 2019-06-14 NOTE — Telephone Encounter (Signed)
LB out 7/20 f/u moved to [facility] and associated appointments adjusted. Confirmed with patient.

## 2019-06-14 NOTE — Telephone Encounter (Signed)
Scheduled appt per staff message - pt is aware of new appts on 7/18 and 7/23 sch message

## 2019-06-15 ENCOUNTER — Inpatient Hospital Stay: Payer: Medicare PPO

## 2019-06-15 VITALS — BP 111/78 | Temp 98.5°F | Resp 18

## 2019-06-15 DIAGNOSIS — Z5112 Encounter for antineoplastic immunotherapy: Secondary | ICD-10-CM | POA: Diagnosis not present

## 2019-06-15 MED ORDER — PEGFILGRASTIM-JMDB 6 MG/0.6ML ~~LOC~~ SOSY
6.0000 mg | PREFILLED_SYRINGE | Freq: Once | SUBCUTANEOUS | Status: AC
  Administered 2019-06-15: 6 mg via SUBCUTANEOUS

## 2019-06-15 NOTE — Patient Instructions (Signed)

## 2019-06-17 ENCOUNTER — Inpatient Hospital Stay: Payer: Medicare PPO | Admitting: Nurse Practitioner

## 2019-06-17 ENCOUNTER — Inpatient Hospital Stay: Payer: Medicare PPO

## 2019-06-17 ENCOUNTER — Inpatient Hospital Stay: Payer: Medicare PPO | Admitting: Physician Assistant

## 2019-06-19 NOTE — Progress Notes (Signed)
[place]   [phone] [phone]   Clinic Follow up Note   Patient Care Team: [name], FNP as PCP - General (Internal Medicine) Arna Snipe, RN as Oncology Nurse Navigator  Date of Service:  06/20/2019  CHIEF COMPLAINT: Metastatic Small Cell Lung Cancer   SUMMARY OF ONCOLOGIC HISTORY: Oncology History Overview Note  Cancer Staging Small cell lung cancer ([place]) Staging form: Lung, AJCC 8th Edition - Clinical: Stage IVB (cTX, cN2, pM1c) - Signed by Truitt Merle, MD on 06/06/2019    Small cell lung cancer ([place])  07/11/2018 Imaging   CT AP 07/11/18 IMPRESSION: 1. Significantly improved appearance of the pancreas and peripancreatic tissues. Improved to resolved peripancreatic edema with resolved and significantly improved peripancreatic lesions. The remaining lesion was felt to represent abscess on prior endoscopic ultrasound sampling. Pseudocyst or infected pseudocyst would be the favored imaging diagnosis. 2. Persistent but improved mass effect upon the inferior aspect of the portal vein and superior aspect of the superior mesenteric vein. 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 3. Aortic Atherosclerosis (ICD10-I70.0). Bilateral common iliac artery ectasia. 4. A left adrenal nodule is indeterminate on precontrast imaging and may have decreased in size since the prior. Consider dedicated adrenal protocol CT. This could either be performed in 6 months to confirm size stability or more acutely, depending on clinical concern. 5. A splenic lesion is indeterminate and can be re-evaluated on follow-up. 6. Hepatic morphology suspicious for cirrhosis. 7. Cholelithiasis. 8.  Emphysema (ICD10-J43.9).   05/01/2019 Imaging   CT AP 05/01/19  IMPRESSION: 1. New diffuse liver metastases. 2. New mild retroperitoneal lymphadenopathy, consistent with metastatic disease. 3. Resolution of previously seen pancreatic  mass since previous study. No radiographic evidence of acute pancreatitis. 4. Stable left adrenal mass, consistent with adrenal adenoma. 5. Stable markedly enlarged prostate and findings of chronic bladder outlet obstruction. 6. Colonic diverticulosis. No radiographic evidence of diverticulitis. 7. Cholelithiasis.   Aortic Atherosclerosis (ICD10-I70.0).   05/28/2019 PET scan   PET 05/28/19  IMPRESSION: 1. Examination is positive for widespread FDG avid liver metastases. Liver lesions are too numerous to count. 2. Hypermetabolic mediastinal and upper abdominal nodal metastases. 3. Left upper lobe and right upper lobe hypermetabolic pulmonary nodules consistent with pulmonary metastasis. 4. Innumerable liver metastases noted within the axial and proximal appendicular skeleton. Bone lesions are too numerous to count. Some of these have a corresponding lytic lesion on CT images. Others appear occult on the corresponding CT images. 5. Solid hyperdense left adrenal nodule is not significantly changed from 05/01/2019 and exhibits mild FDG uptake. Indeterminate. 6. Prostate gland enlargement. Focal area of increased uptake within the left posterior gland is indeterminate.   06/04/2019 Initial Biopsy   Biopsy 06/04/19  Diagnosis Liver, needle/core biopsy - SMALL CELL CARCINOMA. SEE COMMENT.   06/06/2019 Initial Diagnosis   Small cell lung cancer ([place])   06/06/2019 Cancer Staging   Staging form: Lung, AJCC 8th Edition - Clinical: Stage IVB (cTX, cN2, pM1c) - Signed by Truitt Merle, MD on 06/06/2019   06/10/2019 -  Chemotherapy   Carboplatin Day 1 and Etopside day 1-3 with immunotherapy atezolizumab (Tecentriq) on day 1, every 3 weeks for 6 cycles starting 06/10/19. Plan to continue maintenance Tecentriq afterward.    06/14/2019 Imaging    MRI brain 06/14/19  IMPRESSION: No brain metastases. Chronic small-vessel ischemic changes of the pons and cerebral hemispheric white matter.   Probable  metastatic lesions within the upper cervical spine as seen. No sign of spinal  canal encroachment.      CURRENT THERAPY:  First line Carboplatin Day 1 and Etopside day 1-3 with immunotherapy atezolizumab (Tecentriq) on day 1, every 3 weeks for 6 cycles starting 06/10/19.   INTERVAL HISTORY:  Jeremy Mack is here for a follow up of treatment. He presents to the clinic alone. He notes he tolerated treatment well with nausea and mild diarrhea. He was able to manage with antiemetics. He was able to eat adequately. He overall did well.    REVIEW OF SYSTEMS:   Constitutional: Denies fevers, chills or abnormal weight loss Eyes: Denies blurriness of vision Ears, nose, mouth, throat, and face: Denies mucositis or sore throat Respiratory: Denies cough, dyspnea or wheezes Cardiovascular: Denies palpitation, chest discomfort or lower extremity swelling Gastrointestinal:  Denies nausea, heartburn or change in bowel habits Skin: Denies abnormal skin rashes Lymphatics: Denies new lymphadenopathy or easy bruising Neurological:Denies numbness, tingling or new weaknesses Behavioral/Psych: Mood is stable, no new changes  All other systems were reviewed with the patient and are negative.  MEDICAL HISTORY:  Past Medical History:  Diagnosis Date  . Cancer ([place])   . GERD (gastroesophageal reflux disease)   . Hyperlipemia   . Hypertension     SURGICAL HISTORY: Past Surgical History:  Procedure Laterality Date  . arm fracture surgery Right   . colon polyps removed    . ESOPHAGOGASTRODUODENOSCOPY (EGD) WITH PROPOFOL N/A 06/14/2018   Procedure: ESOPHAGOGASTRODUODENOSCOPY (EGD) WITH PROPOFOL;  Surgeon: [name], MD;  Location: [facility];  Service: Endoscopy;  Laterality: N/A;  . EUS N/A 06/14/2018   Procedure: ESOPHAGEAL ENDOSCOPIC ULTRASOUND (EUS) RADIAL;  Surgeon: [name], MD;  Location: [facility];  Service: Endoscopy;  Laterality: N/A;  . FINE NEEDLE ASPIRATION N/A 06/14/2018    Procedure: FINE NEEDLE ASPIRATION (FNA) LINEAR;  Surgeon: [name], MD;  Location: [facility];  Service: Endoscopy;  Laterality: N/A;  . TONSILLECTOMY      I have reviewed the social history and family history with the patient and they are unchanged from previous note.  ALLERGIES:  has No Known Allergies.  MEDICATIONS:  Current Outpatient Medications  Medication Sig Dispense Refill  . allopurinol (ZYLOPRIM) 300 MG tablet Take 1 tablet (300 mg total) by mouth 2 (two) times daily. 60 tablet 0  . amLODipine (NORVASC) 10 MG tablet Take 1 tablet (10 mg total) by mouth daily. 90 tablet 0  . aspirin-sod bicarb-citric acid (ALKA-SELTZER) 325 MG TBEF tablet Take 650 mg by mouth every 6 (six) hours as needed.    . nicotine (NICODERM CQ - DOSED IN MG/24 HOURS) 14 mg/24hr patch Place 1 patch (14 mg total) onto the skin daily. 28 patch 0  . Omega-3 Fatty Acids (FISH OIL) 1000 MG CAPS Take 1 capsule by mouth daily.    . ondansetron (ZOFRAN) 8 MG tablet Take 1 tablet (8 mg total) by mouth 2 (two) times daily as needed for refractory nausea / vomiting. Start on day 3 after carboplatin chemo. 30 tablet 1  . pantoprazole (PROTONIX) 20 MG tablet Take 1 tablet (20 mg total) by mouth daily. 30 tablet 0  . prochlorperazine (COMPAZINE) 10 MG tablet Take 1 tablet (10 mg total) by mouth every 6 (six) hours as needed (Nausea or vomiting). 30 tablet 1   No current facility-administered medications for this visit.     PHYSICAL EXAMINATION: ECOG PERFORMANCE STATUS: 0 - Asymptomatic  Vitals:   06/20/19 0954  BP: 138/78  Pulse: 94  Resp: 18  Temp: 97.7 F (  36.5 C)  SpO2: 100%   Filed Weights   06/20/19 0954  Weight: 167 lb (75.8 kg)    GENERAL:alert, no distress and comfortable SKIN: skin color, texture, turgor are normal, no rashes or significant lesions EYES: normal, Conjunctiva are pink and non-injected, sclera clear  NECK: supple, thyroid normal size, non-tender, without nodularity LYMPH:   no palpable lymphadenopathy in the cervical, axillary  LUNGS: clear to auscultation and percussion with normal breathing effort HEART: regular rate & rhythm and no murmurs and no lower extremity edema ABDOMEN:abdomen soft, non-tender and normal bowel sounds Musculoskeletal:no cyanosis of digits and no clubbing  NEURO: alert & oriented x 3 with fluent speech, no focal motor/sensory deficits  LABORATORY DATA:  I have reviewed the data as listed CBC Latest Ref Rng & Units 06/20/2019 06/10/2019 06/04/2019  WBC 4.0 - 10.5 K/uL 4.4 8.2 7.4  Hemoglobin 13.0 - 17.0 g/dL 12.9(L) 17.9(H) 17.1(H)  Hematocrit 39.0 - 52.0 % 38.0(L) 52.8(H) 51.0  Platelets 150 - 400 K/uL 85(L) 217 240     CMP Latest Ref Rng & Units 06/20/2019 06/10/2019 06/06/2019  Glucose 70 - 99 mg/dL 122(H) 119(H) 102(H)  BUN 8 - 23 mg/dL _0 Creatinine 0.61 - 1.24 mg/dL 0.94 1.77(H) 1.40(H)  Sodium 135 - 145 mmol/L 142 137 140  Potassium 3.5 - 5.1 mmol/L 3.8 4.8 4.7  Chloride 98 - 111 mmol/L 111 102 103  CO2 22 - 32 mmol/L 19(L) 22 23  Calcium 8.9 - 10.3 mg/dL 9.3 10.6(H) 10.0  Total Protein 6.5 - 8.1 g/dL 6.9 8.6(H) -  Total Bilirubin 0.3 - 1.2 mg/dL 0.4 0.6 -  Alkaline Phos 38 - 126 U/L 293(H) 308(H) -  AST 15 - 41 U/L 26 67(H) -  ALT 0 - 44 U/L 44 85(H) -      RADIOGRAPHIC STUDIES: I have personally reviewed the radiological images as listed and agreed with the findings in the report. No results found.   ASSESSMENT & PLAN:  Jeremy Mack is a 73 y.o. male with   1. Small Cell Lung Cancer, Right lung primary, Metastatic to liver, left lung, LNs and bone -I personally reviewed his PET images from 05/28/19 with pt in detail, which shows diffuse metastatic cancer in liver, upper abdominal nodal mets, bilateral lung lesion, right mediastinal nodes and diffuse bone mets.  -We discussed his liver biopsy from 06/04/19 which shows metastatic Small Cell lung cancer, TTF(+). This is most consistent with lung primary. Given his  mediastinal lymph node distribution, I think the right lung lesion is likely the primary tumor. -No brain metastasis as seen on 06/14/19 brain MRI.  -We previously discussed given his stage IV metastatic disease, his cancer is no longer curable but still treatable.  -On 06/10/19 I started him on first line Carboplatin on day 1 and Etopside on day 1-3 with immunotherapy atezolizumab (Tecentriq) on day 1 treatment every 3 weeks for 6 cycles, based on the NCCN guideline. Plan to continue maintenance Tecentriq afterward if he has good response. He tolerated the first cycle well with mild nasuea and diarrhea for a few days.  -For tumor lysis syndrome prevention I will call in Allopurinol and he will continue for now  -Given long term IV treatment, I recommend PAC. He declined for now.  -Labs reviewed, CBC and CMP WNL except Hg 12.9, PLT 85K, ANC 1.5, BG 122, Albumin 3.2, Alk Phos 293. His Uric Acid 3.1. I encouraged him to avoid fall or injury and to watch for signs  of bleeding due to thrombocytopenia.  -F/u in 2 weeks with cycle 2 chemo    2. AKI  -06/04/19 Cr at 1.70, previously normal. Possible related to dehydration vs mild tumor lysis  -He is currently on Allopurinol with treatment.  -Cr normalized to 0.94, uric acid improved to 3.1 today (06/20/19).   3. H/o pancreatic abscess  -imaging in 05/2018 suspicious for neoplasm, CA 19-9 was 1 -EUS and FNA per Dr. Ardis Hughs showed inflammation, no malignancy -s/p two 3-week courses of Augmentin in 05/2018 for pancreatic abscess, f/u CT showed near resolution of pancreatic abnormality -no pancreas mass on 04/2019 CT -I do not feel this is related to his new liver lesions  4. Smoking Cessation, H/o alcohol use  -He reports a past history of alcohol use for 10-15 years, he does not drink now.  -CT in 05/2018 was concerning for hepatic cirrhosis -has 30 year smoking history.  -Liver biopsy from 05/2019 shows metastatic small Cell Lung cancer, Likely related to  his heavy smoking history.  -He still smokes 1/2 ppd. I strongly encouraged him to quit smoking as this can further worsen his condition. He is agreeable and interested in help.  -I called in nicotine patch on 06/06/19  5. Enlarged prostate, elevated PSA  -PSA in 03/2019 9.47, up from 6.8 two years ago -enlarged prostate on CT -Denies urinary symptoms  -He was referred to urology by PCP  6. Goal of care discussion  -We previously discussed the incurable nature of his cancer, and the overall poor prognosis, especially if he does not have good response to chemotherapy or progress on chemo -The patient understands the goal of care is palliative. -he is full code now    PLAN: -He has tolerated the first cycle chemo treatment well.  -Lab, f/u and cycle 2 Carboplatin, Tecentriq and Etopside in 2 weeks    No problem-specific Assessment & Plan notes found for this encounter.   No orders of the defined types were placed in this encounter.  All questions were answered. The patient knows to call the clinic with any problems, questions or concerns. No barriers to learning was detected. I spent 10 minutes counseling the patient face to face. The total time spent in the appointment was 15 minutes and more than 50% was on counseling and review of test results     Truitt Merle, MD 06/20/2019   I, Joslyn Devon, am acting as scribe for Truitt Merle, MD.   I have reviewed the above documentation for accuracy and completeness, and I agree with the above.

## 2019-06-20 ENCOUNTER — Other Ambulatory Visit: Payer: Self-pay

## 2019-06-20 ENCOUNTER — Inpatient Hospital Stay: Payer: Medicare PPO

## 2019-06-20 ENCOUNTER — Inpatient Hospital Stay (HOSPITAL_BASED_OUTPATIENT_CLINIC_OR_DEPARTMENT_OTHER): Payer: Medicare PPO | Admitting: Hematology

## 2019-06-20 ENCOUNTER — Encounter: Payer: Self-pay | Admitting: Hematology

## 2019-06-20 VITALS — BP 138/78 | HR 94 | Temp 97.7°F | Resp 18 | Ht 71.0 in | Wt 167.0 lb

## 2019-06-20 DIAGNOSIS — K5730 Diverticulosis of large intestine without perforation or abscess without bleeding: Secondary | ICD-10-CM

## 2019-06-20 DIAGNOSIS — C772 Secondary and unspecified malignant neoplasm of intra-abdominal lymph nodes: Secondary | ICD-10-CM

## 2019-06-20 DIAGNOSIS — F17210 Nicotine dependence, cigarettes, uncomplicated: Secondary | ICD-10-CM

## 2019-06-20 DIAGNOSIS — D7389 Other diseases of spleen: Secondary | ICD-10-CM

## 2019-06-20 DIAGNOSIS — C7951 Secondary malignant neoplasm of bone: Secondary | ICD-10-CM | POA: Diagnosis not present

## 2019-06-20 DIAGNOSIS — C3491 Malignant neoplasm of unspecified part of right bronchus or lung: Secondary | ICD-10-CM | POA: Diagnosis not present

## 2019-06-20 DIAGNOSIS — I700 Atherosclerosis of aorta: Secondary | ICD-10-CM

## 2019-06-20 DIAGNOSIS — R740 Nonspecific elevation of levels of transaminase and lactic acid dehydrogenase [LDH]: Secondary | ICD-10-CM

## 2019-06-20 DIAGNOSIS — C787 Secondary malignant neoplasm of liver and intrahepatic bile duct: Secondary | ICD-10-CM

## 2019-06-20 DIAGNOSIS — Z8639 Personal history of other endocrine, nutritional and metabolic disease: Secondary | ICD-10-CM

## 2019-06-20 DIAGNOSIS — Z8719 Personal history of other diseases of the digestive system: Secondary | ICD-10-CM

## 2019-06-20 DIAGNOSIS — N179 Acute kidney failure, unspecified: Secondary | ICD-10-CM

## 2019-06-20 DIAGNOSIS — N320 Bladder-neck obstruction: Secondary | ICD-10-CM

## 2019-06-20 DIAGNOSIS — Z5112 Encounter for antineoplastic immunotherapy: Secondary | ICD-10-CM | POA: Diagnosis not present

## 2019-06-20 DIAGNOSIS — K8020 Calculus of gallbladder without cholecystitis without obstruction: Secondary | ICD-10-CM

## 2019-06-20 DIAGNOSIS — I10 Essential (primary) hypertension: Secondary | ICD-10-CM

## 2019-06-20 DIAGNOSIS — R590 Localized enlarged lymph nodes: Secondary | ICD-10-CM

## 2019-06-20 DIAGNOSIS — C3490 Malignant neoplasm of unspecified part of unspecified bronchus or lung: Secondary | ICD-10-CM

## 2019-06-20 DIAGNOSIS — Z79899 Other long term (current) drug therapy: Secondary | ICD-10-CM

## 2019-06-20 DIAGNOSIS — R160 Hepatomegaly, not elsewhere classified: Secondary | ICD-10-CM

## 2019-06-20 DIAGNOSIS — E279 Disorder of adrenal gland, unspecified: Secondary | ICD-10-CM

## 2019-06-20 DIAGNOSIS — N400 Enlarged prostate without lower urinary tract symptoms: Secondary | ICD-10-CM

## 2019-06-20 DIAGNOSIS — J439 Emphysema, unspecified: Secondary | ICD-10-CM

## 2019-06-20 LAB — CBC WITH DIFFERENTIAL (CANCER CENTER ONLY)
Abs Immature Granulocytes: 0.19 10*3/uL — ABNORMAL HIGH (ref 0.00–0.07)
Basophils Absolute: 0.1 10*3/uL (ref 0.0–0.1)
Basophils Relative: 1 %
Eosinophils Absolute: 0.1 10*3/uL (ref 0.0–0.5)
Eosinophils Relative: 1 %
HCT: 38 % — ABNORMAL LOW (ref 39.0–52.0)
Hemoglobin: 12.9 g/dL — ABNORMAL LOW (ref 13.0–17.0)
Immature Granulocytes: 4 %
Lymphocytes Relative: 52 %
Lymphs Abs: 2.2 10*3/uL (ref 0.7–4.0)
MCH: 32.6 pg (ref 26.0–34.0)
MCHC: 33.9 g/dL (ref 30.0–36.0)
MCV: 96 fL (ref 80.0–100.0)
Monocytes Absolute: 0.4 10*3/uL (ref 0.1–1.0)
Monocytes Relative: 8 %
Neutro Abs: 1.5 10*3/uL — ABNORMAL LOW (ref 1.7–7.7)
Neutrophils Relative %: 34 %
Platelet Count: 85 10*3/uL — ABNORMAL LOW (ref 150–400)
RBC: 3.96 MIL/uL — ABNORMAL LOW (ref 4.22–5.81)
RDW: 13.2 % (ref 11.5–15.5)
WBC Count: 4.4 10*3/uL (ref 4.0–10.5)
nRBC: 0 % (ref 0.0–0.2)

## 2019-06-20 LAB — CMP (CANCER CENTER ONLY)
ALT: 44 U/L (ref 0–44)
AST: 26 U/L (ref 15–41)
Albumin: 3.2 g/dL — ABNORMAL LOW (ref 3.5–5.0)
Alkaline Phosphatase: 293 U/L — ABNORMAL HIGH (ref 38–126)
Anion gap: 12 (ref 5–15)
BUN: 10 mg/dL (ref 8–23)
CO2: 19 mmol/L — ABNORMAL LOW (ref 22–32)
Calcium: 9.3 mg/dL (ref 8.9–10.3)
Chloride: 111 mmol/L (ref 98–111)
Creatinine: 0.94 mg/dL (ref 0.61–1.24)
GFR, Est AFR Am: >60 mL/min
GFR, Estimated: >60 mL/min
Glucose, Bld: 122 mg/dL — ABNORMAL HIGH (ref 70–99)
Potassium: 3.8 mmol/L (ref 3.5–5.1)
Sodium: 142 mmol/L (ref 135–145)
Total Bilirubin: 0.4 mg/dL (ref 0.3–1.2)
Total Protein: 6.9 g/dL (ref 6.5–8.1)

## 2019-06-20 LAB — URIC ACID: Uric Acid, Serum: 3.1 mg/dL — ABNORMAL LOW (ref 3.7–8.6)

## 2019-06-21 ENCOUNTER — Telehealth: Payer: Self-pay | Admitting: Hematology

## 2019-06-21 NOTE — Telephone Encounter (Signed)
No los per 7/23.

## 2019-06-27 NOTE — Progress Notes (Signed)
[facility]   Telephone:(336) [phone] Fax:(336) [phone]   Clinic Follow up Note   Patient Care Team: [name], FNP as PCP - General (Internal Medicine) Arna Snipe, RN as Oncology Nurse Navigator  Date of Service:  07/01/2019  CHIEF COMPLAINT: Metastatic Small Cell Lung Cancer  SUMMARY OF ONCOLOGIC HISTORY: Oncology History Overview Note  Cancer Staging Small cell lung cancer ([place]) Staging form: Lung, AJCC 8th Edition - Clinical: Stage IVB (cTX, cN2, pM1c) - Signed by Truitt Merle, MD on 06/06/2019    Small cell lung cancer ([place])  07/11/2018 Imaging   CT AP 07/11/18 IMPRESSION: 1. Significantly improved appearance of the pancreas and peripancreatic tissues. Improved to resolved peripancreatic edema with resolved and significantly improved peripancreatic lesions. The remaining lesion was felt to represent abscess on prior endoscopic ultrasound sampling. Pseudocyst or infected pseudocyst would be the favored imaging diagnosis. 2. Persistent but improved mass effect upon the inferior aspect of the portal vein and superior aspect of the superior mesenteric vein. 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 3. Aortic Atherosclerosis (ICD10-I70.0). Bilateral common iliac artery ectasia. 4. A left adrenal nodule is indeterminate on precontrast imaging and may have decreased in size since the prior. Consider dedicated adrenal protocol CT. This could either be performed in 6 months to confirm size stability or more acutely, depending on clinical concern. 5. A splenic lesion is indeterminate and can be re-evaluated on follow-up. 6. Hepatic morphology suspicious for cirrhosis. 7. Cholelithiasis. 8.  Emphysema (ICD10-J43.9).   05/01/2019 Imaging   CT AP 05/01/19  IMPRESSION: 1. New diffuse liver metastases. 2. New mild retroperitoneal lymphadenopathy, consistent with metastatic disease. 3. Resolution of previously seen pancreatic  mass since previous study. No radiographic evidence of acute pancreatitis. 4. Stable left adrenal mass, consistent with adrenal adenoma. 5. Stable markedly enlarged prostate and findings of chronic bladder outlet obstruction. 6. Colonic diverticulosis. No radiographic evidence of diverticulitis. 7. Cholelithiasis.   Aortic Atherosclerosis (ICD10-I70.0).   05/28/2019 PET scan   PET 05/28/19  IMPRESSION: 1. Examination is positive for widespread FDG avid liver metastases. Liver lesions are too numerous to count. 2. Hypermetabolic mediastinal and upper abdominal nodal metastases. 3. Left upper lobe and right upper lobe hypermetabolic pulmonary nodules consistent with pulmonary metastasis. 4. Innumerable liver metastases noted within the axial and proximal appendicular skeleton. Bone lesions are too numerous to count. Some of these have a corresponding lytic lesion on CT images. Others appear occult on the corresponding CT images. 5. Solid hyperdense left adrenal nodule is not significantly changed from 05/01/2019 and exhibits mild FDG uptake. Indeterminate. 6. Prostate gland enlargement. Focal area of increased uptake within the left posterior gland is indeterminate.   06/04/2019 Initial Biopsy   Biopsy 06/04/19  Diagnosis Liver, needle/core biopsy - SMALL CELL CARCINOMA. SEE COMMENT.   06/06/2019 Initial Diagnosis   Small cell lung cancer ([place])   06/06/2019 Cancer Staging   Staging form: Lung, AJCC 8th Edition - Clinical: Stage IVB (cTX, cN2, pM1c) - Signed by Truitt Merle, MD on 06/06/2019   06/10/2019 -  Chemotherapy   Carboplatin Day 1 and Etopside day 1-3 with immunotherapy atezolizumab (Tecentriq) on day 1, every 3 weeks for 6 cycles starting 06/10/19. Plan to continue maintenance Tecentriq afterward.    06/14/2019 Imaging    MRI brain 06/14/19  IMPRESSION: No brain metastases. Chronic small-vessel ischemic changes of the pons and cerebral hemispheric white matter.   Probable  metastatic lesions within the upper cervical spine as seen. No sign of spinal canal  encroachment.      CURRENT THERAPY:  First lineCarboplatin Day 1 and Etopside day 1-3 with immunotherapyatezolizumab(Tecentriq) on day 1, every 3 weeks for 6 cyclesstarting 06/10/19.  INTERVAL HISTORY:  Jeremy Mack is here for a follow up and treatment. He presents to the clinic alone. He notes he tolerated first cycle chemo well. He notes neck stiffness from how he slept. He notes antiemetics controls his nausea very well. He denies LE swelling, abdominal issues or mouth sores.    REVIEW OF SYSTEMS:   Constitutional: Denies fevers, chills or abnormal weight loss Eyes: Denies blurriness of vision Ears, nose, mouth, throat, and face: Denies mucositis or sore throat Respiratory: Denies cough, dyspnea or wheezes Cardiovascular: Denies palpitation, chest discomfort or lower extremity swelling Gastrointestinal:  Denies nausea, heartburn or change in bowel habits Skin: Denies abnormal skin rashes MSK: (+) Neck stiffness  Lymphatics: Denies new lymphadenopathy or easy bruising Neurological:Denies numbness, tingling or new weaknesses Behavioral/Psych: Mood is stable, no new changes  All other systems were reviewed with the patient and are negative.  MEDICAL HISTORY:  Past Medical History:  Diagnosis Date  . Cancer ([place])   . GERD (gastroesophageal reflux disease)   . Hyperlipemia   . Hypertension     SURGICAL HISTORY: Past Surgical History:  Procedure Laterality Date  . arm fracture surgery Right   . colon polyps removed    . ESOPHAGOGASTRODUODENOSCOPY (EGD) WITH PROPOFOL N/A 06/14/2018   Procedure: ESOPHAGOGASTRODUODENOSCOPY (EGD) WITH PROPOFOL;  Surgeon: [name], MD;  Location: [facility];  Service: Endoscopy;  Laterality: N/A;  . EUS N/A 06/14/2018   Procedure: ESOPHAGEAL ENDOSCOPIC ULTRASOUND (EUS) RADIAL;  Surgeon: [name], MD;  Location: [facility];  Service: Endoscopy;   Laterality: N/A;  . FINE NEEDLE ASPIRATION N/A 06/14/2018   Procedure: FINE NEEDLE ASPIRATION (FNA) LINEAR;  Surgeon: [name], MD;  Location: [facility];  Service: Endoscopy;  Laterality: N/A;  . TONSILLECTOMY      I have reviewed the social history and family history with the patient and they are unchanged from previous note.  ALLERGIES:  has No Known Allergies.  MEDICATIONS:  Current Outpatient Medications  Medication Sig Dispense Refill  . allopurinol (ZYLOPRIM) 300 MG tablet TAKE 1 TABLET BY MOUTH TWICE A DAY 60 tablet 0  . amLODipine (NORVASC) 10 MG tablet Take 1 tablet (10 mg total) by mouth daily. 90 tablet 0  . aspirin-sod bicarb-citric acid (ALKA-SELTZER) 325 MG TBEF tablet Take 650 mg by mouth every 6 (six) hours as needed.    . nicotine (NICODERM CQ - DOSED IN MG/24 HOURS) 14 mg/24hr patch Place 1 patch (14 mg total) onto the skin daily. 28 patch 0  . Omega-3 Fatty Acids (FISH OIL) 1000 MG CAPS Take 1 capsule by mouth daily.    . ondansetron (ZOFRAN) 8 MG tablet Take 1 tablet (8 mg total) by mouth 2 (two) times daily as needed for refractory nausea / vomiting. Start on day 3 after carboplatin chemo. 30 tablet 1  . pantoprazole (PROTONIX) 20 MG tablet Take 1 tablet (20 mg total) by mouth daily. 30 tablet 0  . prochlorperazine (COMPAZINE) 10 MG tablet Take 1 tablet (10 mg total) by mouth every 6 (six) hours as needed (Nausea or vomiting). 30 tablet 1   No current facility-administered medications for this visit.    Facility-Administered Medications Ordered in Other Visits  Medication Dose Route Frequency Provider Last Rate Last Dose  . atezolizumab (TECENTRIQ) 1,200 mg in sodium chloride 0.9 % 250  mL chemo infusion  1,200 mg Intravenous Once Truitt Merle, MD      . CARBOplatin (PARAPLATIN) 470 mg in sodium chloride 0.9 % 250 mL chemo infusion  470 mg Intravenous Once Truitt Merle, MD      . etoposide (VEPESID) 200 mg in sodium chloride 0.9 % 500 mL chemo infusion  100 mg/m2  (Treatment Plan Recorded) Intravenous Once Truitt Merle, MD      . fosaprepitant (EMEND) 150 mg, dexamethasone (DECADRON) 12 mg in sodium chloride 0.9 % 145 mL IVPB   Intravenous Once Truitt Merle, MD      . palonosetron (ALOXI) injection 0.25 mg  0.25 mg Intravenous Once Truitt Merle, MD        PHYSICAL EXAMINATION: ECOG PERFORMANCE STATUS: 1 - Symptomatic but completely ambulatory  Vitals:   07/01/19 1003  BP: 123/75  Pulse: (!) 103  Resp: 18  Temp: 98.5 F (36.9 C)  SpO2: 100%   Filed Weights   07/01/19 1003  Weight: 166 lb (75.3 kg)    GENERAL:alert, no distress and comfortable SKIN: skin color, texture, turgor are normal, no rashes or significant lesions EYES: normal, Conjunctiva are pink and non-injected, sclera clear  NECK: supple, thyroid normal size, non-tender, without nodularity LYMPH:  no palpable lymphadenopathy in the cervical, axillary  LUNGS: clear to auscultation and percussion with normal breathing effort HEART: regular rate & rhythm and no murmurs and no lower extremity edema ABDOMEN:abdomen soft, non-tender and normal bowel sounds Musculoskeletal:no cyanosis of digits and no clubbing  NEURO: alert & oriented x 3 with fluent speech, no focal motor/sensory deficits  LABORATORY DATA:  I have reviewed the data as listed CBC Latest Ref Rng & Units 07/01/2019 06/20/2019 06/10/2019  WBC 4.0 - 10.5 K/uL 14.1(H) 4.4 8.2  Hemoglobin 13.0 - 17.0 g/dL 12.1(L) 12.9(L) 17.9(H)  Hematocrit 39.0 - 52.0 % 35.6(L) 38.0(L) 52.8(H)  Platelets 150 - 400 K/uL 367 85(L) 217     CMP Latest Ref Rng & Units 07/01/2019 06/20/2019 06/10/2019  Glucose 70 - 99 mg/dL 145(H) 122(H) 119(H)  BUN 8 - 23 mg/dL 9 10 19   Creatinine 0.61 - 1.24 mg/dL 1.09 0.94 1.77(H)  Sodium 135 - 145 mmol/L 139 142 137  Potassium 3.5 - 5.1 mmol/L 3.8 3.8 4.8  Chloride 98 - 111 mmol/L 107 111 102  CO2 22 - 32 mmol/L 23 19(L) 22  Calcium 8.9 - 10.3 mg/dL 9.4 9.3 10.6(H)  Total Protein 6.5 - 8.1 g/dL 7.4 6.9 8.6(H)   Total Bilirubin 0.3 - 1.2 mg/dL 0.5 0.4 0.6  Alkaline Phos 38 - 126 U/L 223(H) 293(H) 308(H)  AST 15 - 41 U/L 15 26 67(H)  ALT 0 - 44 U/L 14 44 85(H)      RADIOGRAPHIC STUDIES: I have personally reviewed the radiological images as listed and agreed with the findings in the report. No results found.   ASSESSMENT & PLAN:  Jeremy Mack is a 73 y.o. male with   1.Small Cell Lung Cancer,Right lung primary,Metastatic to liver,left lung, LNs andbone -I personally reviewedhis PETimagesfrom 05/28/19 with pt in detail,which shows diffuse metastatic cancer in liver, upper abdominal nodal mets,bilateral lung lesion, right mediastinal nodes and diffuse bone mets. -We discussed his liverbiopsyfrom 06/04/19 which shows metastatic Small Cell lung cancer,TTF(+). This is most consistent with lung primary.Given his mediastinal lymph node distribution, I think the right lung lesion is likely the primary tumor. -No brain metastasis as seen on 06/14/19 brain MRI.  -We previously discussed given his stage IV metastatic disease, his  cancer is no longer curable but still treatable.  -On 06/10/19 I started him onfirst lineCarboplatin on day 1 and Etopside on day 1-3 with immunotherapy atezolizumab(Tecentriq) on day 1 treatment every 3 weeks for 6 cycles, based on the NCCNguideline.Plan to continue maintenance Tecentriq afterward if he has good response. He tolerated the first cycle well with mild nausea and diarrhea for a few days, recovered well. -For tumor lysis syndrome prevention I will call in Allopurinol and he will continue for now  -Labs reviewed, CBC and CMP WNL except WBC 14.2, Hg 12.1, ANC 10.4, ALK 223. TSH pending. Overall adequate to proceed with C2 Carboplatin/Etopside/Tecentriq today.  -Plan to scan him after cycle 3 to monitor response. If he has good response or stable disease, will continue for a total of 6 cycles then switch to Atezolizumab maintenance therapy after cycle 6. He  understands and agree with the plan.  -F/u in 3 weeks before cycle 3   2. AKI -06/04/19 Cr at 1.70, previously normal.Possible related to dehydration vs mild tumor lysis -He is currently on Allopurinol with treatment.  -resolved now   3. H/o pancreatic abscess  -imaging in 05/2018 suspicious for neoplasm, CA 19-9 was 1 -EUS and FNA per Dr. Ardis Hughs showed inflammation, no malignancy -s/p two 3-week courses of Augmentin in 05/2018 for pancreatic abscess, f/u CT showed near resolution of pancreatic abnormality -no pancreas mass on 04/2019 CT -I donot feel this is related to his new liver lesions  4. SmokingCessation,H/oalcohol use  -He reports a past history of alcohol use for 10-15 years, he does not drink now. -CT in 05/2018 was concerning for hepatic cirrhosis -has 30 year smoking history.  -Liver biopsy from 05/2019 shows metastatic small Cell Lung cancer, Likely related to his heavy smoking history.  -He still smokes 1/2 ppd. I strongly encouraged him to quit smoking as this can further worsen his condition. He is agreeable and interested in help.  -I called in nicotine patch on 06/06/19  5. Enlarged prostate, elevated PSA  -PSA in 03/2019 9.47, up from 6.8 two years ago -enlarged prostate on CT -Denies urinary symptoms  -He was referred to urology by PCP  6.Goal of care discussion  -The patient understands the goal of care is palliative. -he is full code now   PLAN: -Labs reviewed and adequate to proceed with cycle 2 Carboplatin, Tecentriq and Etopside today with GCSF on day 4 (pm) -lab, f/u and chemo Carboplatin, Tecentriq and Etopside in 3 weeks (etopside on day 1-3)   No problem-specific Assessment & Plan notes found for this encounter.   No orders of the defined types were placed in this encounter.  All questions were answered. The patient knows to call the clinic with any problems, questions or concerns. No barriers to learning was detected. I spent 15  minutes counseling the patient face to face. The total time spent in the appointment was 20 minutes and more than 50% was on counseling and review of test results     Truitt Merle, MD 07/01/2019   I, Joslyn Devon, am acting as scribe for Truitt Merle, MD.   I have reviewed the above documentation for accuracy and completeness, and I agree with the above.

## 2019-06-29 ENCOUNTER — Other Ambulatory Visit: Payer: Self-pay | Admitting: Hematology

## 2019-07-01 ENCOUNTER — Inpatient Hospital Stay: Payer: Medicare PPO

## 2019-07-01 ENCOUNTER — Inpatient Hospital Stay: Payer: Medicare PPO | Attending: Nurse Practitioner

## 2019-07-01 ENCOUNTER — Inpatient Hospital Stay (HOSPITAL_BASED_OUTPATIENT_CLINIC_OR_DEPARTMENT_OTHER): Payer: Medicare PPO | Admitting: Hematology

## 2019-07-01 ENCOUNTER — Encounter: Payer: Self-pay | Admitting: Hematology

## 2019-07-01 ENCOUNTER — Telehealth: Payer: Self-pay | Admitting: Hematology

## 2019-07-01 ENCOUNTER — Other Ambulatory Visit: Payer: Self-pay

## 2019-07-01 VITALS — HR 95 | Resp 17

## 2019-07-01 VITALS — BP 123/75 | HR 103 | Temp 98.5°F | Resp 18 | Ht 71.0 in | Wt 166.0 lb

## 2019-07-01 DIAGNOSIS — Z5112 Encounter for antineoplastic immunotherapy: Secondary | ICD-10-CM | POA: Diagnosis present

## 2019-07-01 DIAGNOSIS — N400 Enlarged prostate without lower urinary tract symptoms: Secondary | ICD-10-CM | POA: Diagnosis not present

## 2019-07-01 DIAGNOSIS — M542 Cervicalgia: Secondary | ICD-10-CM | POA: Diagnosis not present

## 2019-07-01 DIAGNOSIS — F17210 Nicotine dependence, cigarettes, uncomplicated: Secondary | ICD-10-CM | POA: Insufficient documentation

## 2019-07-01 DIAGNOSIS — D7389 Other diseases of spleen: Secondary | ICD-10-CM | POA: Diagnosis not present

## 2019-07-01 DIAGNOSIS — C787 Secondary malignant neoplasm of liver and intrahepatic bile duct: Secondary | ICD-10-CM | POA: Diagnosis not present

## 2019-07-01 DIAGNOSIS — R110 Nausea: Secondary | ICD-10-CM | POA: Insufficient documentation

## 2019-07-01 DIAGNOSIS — C772 Secondary and unspecified malignant neoplasm of intra-abdominal lymph nodes: Secondary | ICD-10-CM | POA: Insufficient documentation

## 2019-07-01 DIAGNOSIS — C7802 Secondary malignant neoplasm of left lung: Secondary | ICD-10-CM | POA: Diagnosis not present

## 2019-07-01 DIAGNOSIS — Z79899 Other long term (current) drug therapy: Secondary | ICD-10-CM | POA: Diagnosis not present

## 2019-07-01 DIAGNOSIS — K219 Gastro-esophageal reflux disease without esophagitis: Secondary | ICD-10-CM | POA: Diagnosis not present

## 2019-07-01 DIAGNOSIS — M436 Torticollis: Secondary | ICD-10-CM | POA: Diagnosis not present

## 2019-07-01 DIAGNOSIS — J439 Emphysema, unspecified: Secondary | ICD-10-CM | POA: Insufficient documentation

## 2019-07-01 DIAGNOSIS — Z5189 Encounter for other specified aftercare: Secondary | ICD-10-CM | POA: Diagnosis not present

## 2019-07-01 DIAGNOSIS — Z5111 Encounter for antineoplastic chemotherapy: Secondary | ICD-10-CM | POA: Diagnosis present

## 2019-07-01 DIAGNOSIS — I10 Essential (primary) hypertension: Secondary | ICD-10-CM

## 2019-07-01 DIAGNOSIS — Z7982 Long term (current) use of aspirin: Secondary | ICD-10-CM | POA: Diagnosis not present

## 2019-07-01 DIAGNOSIS — C3491 Malignant neoplasm of unspecified part of right bronchus or lung: Secondary | ICD-10-CM | POA: Diagnosis not present

## 2019-07-01 DIAGNOSIS — C3490 Malignant neoplasm of unspecified part of unspecified bronchus or lung: Secondary | ICD-10-CM

## 2019-07-01 DIAGNOSIS — E785 Hyperlipidemia, unspecified: Secondary | ICD-10-CM | POA: Insufficient documentation

## 2019-07-01 DIAGNOSIS — Z7189 Other specified counseling: Secondary | ICD-10-CM

## 2019-07-01 DIAGNOSIS — C7951 Secondary malignant neoplasm of bone: Secondary | ICD-10-CM | POA: Insufficient documentation

## 2019-07-01 LAB — CMP (CANCER CENTER ONLY)
ALT: 14 U/L (ref 0–44)
AST: 15 U/L (ref 15–41)
Albumin: 2.9 g/dL — ABNORMAL LOW (ref 3.5–5.0)
Alkaline Phosphatase: 223 U/L — ABNORMAL HIGH (ref 38–126)
Anion gap: 9 (ref 5–15)
BUN: 9 mg/dL (ref 8–23)
CO2: 23 mmol/L (ref 22–32)
Calcium: 9.4 mg/dL (ref 8.9–10.3)
Chloride: 107 mmol/L (ref 98–111)
Creatinine: 1.09 mg/dL (ref 0.61–1.24)
GFR, Est AFR Am: >60 mL/min (ref >60)
GFR, Estimated: >60 mL/min (ref >60)
Glucose, Bld: 145 mg/dL — ABNORMAL HIGH (ref 70–99)
Potassium: 3.8 mmol/L (ref 3.5–5.1)
Sodium: 139 mmol/L (ref 135–145)
Total Bilirubin: 0.5 mg/dL (ref 0.3–1.2)
Total Protein: 7.4 g/dL (ref 6.5–8.1)

## 2019-07-01 LAB — CBC WITH DIFFERENTIAL (CANCER CENTER ONLY)
Abs Immature Granulocytes: 0.1 10*3/uL — ABNORMAL HIGH (ref 0.00–0.07)
Basophils Absolute: 0 10*3/uL (ref 0.0–0.1)
Basophils Relative: 0 %
Eosinophils Absolute: 0 10*3/uL (ref 0.0–0.5)
Eosinophils Relative: 0 %
HCT: 35.6 % — ABNORMAL LOW (ref 39.0–52.0)
Hemoglobin: 12.1 g/dL — ABNORMAL LOW (ref 13.0–17.0)
Immature Granulocytes: 1 %
Lymphocytes Relative: 16 %
Lymphs Abs: 2.2 10*3/uL (ref 0.7–4.0)
MCH: 32.4 pg (ref 26.0–34.0)
MCHC: 34 g/dL (ref 30.0–36.0)
MCV: 95.2 fL (ref 80.0–100.0)
Monocytes Absolute: 1.4 10*3/uL — ABNORMAL HIGH (ref 0.1–1.0)
Monocytes Relative: 10 %
Neutro Abs: 10.4 10*3/uL — ABNORMAL HIGH (ref 1.7–7.7)
Neutrophils Relative %: 73 %
Platelet Count: 367 10*3/uL (ref 150–400)
RBC: 3.74 MIL/uL — ABNORMAL LOW (ref 4.22–5.81)
RDW: 15 % (ref 11.5–15.5)
WBC Count: 14.1 10*3/uL — ABNORMAL HIGH (ref 4.0–10.5)
nRBC: 0.6 % — ABNORMAL HIGH (ref 0.0–0.2)

## 2019-07-01 LAB — TSH: TSH: 0.653 u[IU]/mL (ref 0.320–4.118)

## 2019-07-01 MED ORDER — SODIUM CHLORIDE 0.9 % IV SOLN
100.0000 mg/m2 | Freq: Once | INTRAVENOUS | Status: AC
  Administered 2019-07-01: 13:00:00 200 mg via INTRAVENOUS
  Filled 2019-07-01: qty 10

## 2019-07-01 MED ORDER — SODIUM CHLORIDE 0.9 % IV SOLN
1200.0000 mg | Freq: Once | INTRAVENOUS | Status: AC
  Administered 2019-07-01: 12:00:00 1200 mg via INTRAVENOUS
  Filled 2019-07-01: qty 20

## 2019-07-01 MED ORDER — SODIUM CHLORIDE 0.9 % IV SOLN
Freq: Once | INTRAVENOUS | Status: AC
  Administered 2019-07-01: 12:00:00 via INTRAVENOUS
  Filled 2019-07-01: qty 5

## 2019-07-01 MED ORDER — PALONOSETRON HCL INJECTION 0.25 MG/5ML
INTRAVENOUS | Status: AC
  Filled 2019-07-01: qty 5

## 2019-07-01 MED ORDER — PALONOSETRON HCL INJECTION 0.25 MG/5ML
0.2500 mg | Freq: Once | INTRAVENOUS | Status: AC
  Administered 2019-07-01: 12:00:00 0.25 mg via INTRAVENOUS

## 2019-07-01 MED ORDER — SODIUM CHLORIDE 0.9 % IV SOLN
470.0000 mg | Freq: Once | INTRAVENOUS | Status: AC
  Administered 2019-07-01: 13:00:00 470 mg via INTRAVENOUS
  Filled 2019-07-01: qty 47

## 2019-07-01 MED ORDER — SODIUM CHLORIDE 0.9 % IV SOLN
Freq: Once | INTRAVENOUS | Status: AC
  Administered 2019-07-01: 11:00:00 via INTRAVENOUS
  Filled 2019-07-01: qty 250

## 2019-07-01 NOTE — Patient Instructions (Signed)
[facility] Discharge Instructions for Patients Receiving Chemotherapy  Today you received the following chemotherapy agents: Atezolizumab (Tecentriq), Etoposide (Vepesid, VP-16), and Carboplatin (Paraplatin)  To help prevent nausea and vomiting after your treatment, we encourage you to take your nausea medication as directed. Received Aloxi during treatment today-->Take your Compazine prescription (not your Zofran) for the next 3 days as needed.    If you develop nausea and vomiting that is not controlled by your nausea medication, call the clinic.   BELOW ARE SYMPTOMS THAT SHOULD BE REPORTED IMMEDIATELY:  *FEVER GREATER THAN 100.5 F  *CHILLS WITH OR WITHOUT FEVER  NAUSEA AND VOMITING THAT IS NOT CONTROLLED WITH YOUR NAUSEA MEDICATION  *UNUSUAL SHORTNESS OF BREATH  *UNUSUAL BRUISING OR BLEEDING  TENDERNESS IN MOUTH AND THROAT WITH OR WITHOUT PRESENCE OF ULCERS  *URINARY PROBLEMS  *BOWEL PROBLEMS  UNUSUAL RASH Items with * indicate a potential emergency and should be followed up as soon as possible.  Feel free to call the clinic should you have any questions or concerns. The clinic phone number is [phone].  Please show the [facility] at check-in to the Emergency Department and triage nurse.  Coronavirus (COVID-19) Are you at risk?  Are you at risk for the Coronavirus (COVID-19)?  To be considered HIGH RISK for Coronavirus (COVID-19), you have to meet the following criteria:  . Traveled to [place], [place], [place], [place] or [place]; or in the [place] to [place], [place], [employer], or [place]; and have fever, cough, and shortness of breath within the last 2 weeks of travel OR . Been in close contact with a person diagnosed with COVID-19 within the last 2 weeks and have fever, cough, and shortness of breath . IF YOU DO NOT MEET THESE CRITERIA, YOU ARE CONSIDERED LOW RISK FOR COVID-19.  What to do if you are HIGH RISK for  COVID-19?  Marland Kitchen If you are having a medical emergency, call 911. . Seek medical care right away. Before you go to a doctor's office, urgent care or emergency department, call ahead and tell them about your recent travel, contact with someone diagnosed with COVID-19, and your symptoms. You should receive instructions from your physician's office regarding next steps of care.  . When you arrive at healthcare provider, tell the healthcare staff immediately you have returned from visiting [place], [place], [place], [place] or [place]; or traveled in the [place] to [place], [place], [place], or [place]; in the last two weeks or you have been in close contact with a person diagnosed with COVID-19 in the last 2 weeks.   . Tell the health care staff about your symptoms: fever, cough and shortness of breath. . After you have been seen by a medical provider, you will be either: o Tested for (COVID-19) and discharged home on quarantine except to seek medical care if symptoms worsen, and asked to  - Stay home and avoid contact with others until you get your results (4-5 days)  - Avoid travel on public transportation if possible (such as bus, train, or airplane) or o Sent to the Emergency Department by EMS for evaluation, COVID-19 testing, and possible admission depending on your condition and test results.  What to do if you are LOW RISK for COVID-19?  Reduce your risk of any infection by using the same precautions used for avoiding the common cold or flu:  Marland Kitchen Wash your hands often with soap and warm water for at least 20  seconds.  If soap and water are not readily available, use an alcohol-based hand sanitizer with at least 60% alcohol.  . If coughing or sneezing, cover your mouth and nose by coughing or sneezing into the elbow areas of your shirt or coat, into a tissue or into your sleeve (not your hands). . Avoid shaking hands with others and consider head nods or verbal greetings only. . Avoid  touching your eyes, nose, or mouth with unwashed hands.  . Avoid close contact with people who are sick. . Avoid places or events with large numbers of people in one location, like concerts or sporting events. . Carefully consider travel plans you have or are making. . If you are planning any travel outside or inside the [place], visit the CDC's Travelers' Health webpage for the latest health notices. . If you have some symptoms but not all symptoms, continue to monitor at home and seek medical attention if your symptoms worsen. . If you are having a medical emergency, call 911.   [place] / e-Visit: [email or web address]         [facility]: [facility]: [phone]                   [facility]: [phone]

## 2019-07-01 NOTE — Telephone Encounter (Signed)
Scheduled appt per 8/3 los.  Patient will get a print out after treatment.

## 2019-07-02 ENCOUNTER — Inpatient Hospital Stay: Payer: Medicare PPO

## 2019-07-02 ENCOUNTER — Other Ambulatory Visit: Payer: Self-pay

## 2019-07-02 VITALS — BP 109/76 | HR 98 | Temp 98.3°F | Resp 18

## 2019-07-02 DIAGNOSIS — C3490 Malignant neoplasm of unspecified part of unspecified bronchus or lung: Secondary | ICD-10-CM

## 2019-07-02 DIAGNOSIS — Z5189 Encounter for other specified aftercare: Secondary | ICD-10-CM | POA: Diagnosis not present

## 2019-07-02 DIAGNOSIS — Z7189 Other specified counseling: Secondary | ICD-10-CM

## 2019-07-02 MED ORDER — SODIUM CHLORIDE 0.9 % IV SOLN
Freq: Once | INTRAVENOUS | Status: AC
  Administered 2019-07-02: 10:00:00 via INTRAVENOUS
  Filled 2019-07-02: qty 250

## 2019-07-02 MED ORDER — SODIUM CHLORIDE 0.9 % IV SOLN
100.0000 mg/m2 | Freq: Once | INTRAVENOUS | Status: AC
  Administered 2019-07-02: 200 mg via INTRAVENOUS
  Filled 2019-07-02: qty 10

## 2019-07-02 MED ORDER — DEXAMETHASONE SODIUM PHOSPHATE 10 MG/ML IJ SOLN
10.0000 mg | Freq: Once | INTRAMUSCULAR | Status: AC
  Administered 2019-07-02: 10 mg via INTRAVENOUS

## 2019-07-02 MED ORDER — DEXAMETHASONE SODIUM PHOSPHATE 10 MG/ML IJ SOLN
INTRAMUSCULAR | Status: AC
  Filled 2019-07-02: qty 1

## 2019-07-02 NOTE — Patient Instructions (Signed)
[facility] Discharge Instructions for Patients Receiving Chemotherapy  Today you received the following chemotherapy agents: Etoposide   To help prevent nausea and vomiting after your treatment, we encourage you to take your nausea medication as directed.    If you develop nausea and vomiting that is not controlled by your nausea medication, call the clinic.   BELOW ARE SYMPTOMS THAT SHOULD BE REPORTED IMMEDIATELY:  *FEVER GREATER THAN 100.5 F  *CHILLS WITH OR WITHOUT FEVER  NAUSEA AND VOMITING THAT IS NOT CONTROLLED WITH YOUR NAUSEA MEDICATION  *UNUSUAL SHORTNESS OF BREATH  *UNUSUAL BRUISING OR BLEEDING  TENDERNESS IN MOUTH AND THROAT WITH OR WITHOUT PRESENCE OF ULCERS  *URINARY PROBLEMS  *BOWEL PROBLEMS  UNUSUAL RASH Items with * indicate a potential emergency and should be followed up as soon as possible.  Feel free to call the clinic should you have any questions or concerns. The clinic phone number is [phone].  Please show the [place] at check-in to the Emergency Department and triage nurse.

## 2019-07-03 ENCOUNTER — Other Ambulatory Visit: Payer: Self-pay

## 2019-07-03 ENCOUNTER — Inpatient Hospital Stay: Payer: Medicare PPO

## 2019-07-03 VITALS — BP 115/73 | HR 93 | Temp 98.0°F | Resp 18

## 2019-07-03 DIAGNOSIS — Z7189 Other specified counseling: Secondary | ICD-10-CM

## 2019-07-03 DIAGNOSIS — Z5189 Encounter for other specified aftercare: Secondary | ICD-10-CM | POA: Diagnosis not present

## 2019-07-03 DIAGNOSIS — C3490 Malignant neoplasm of unspecified part of unspecified bronchus or lung: Secondary | ICD-10-CM

## 2019-07-03 MED ORDER — DEXAMETHASONE SODIUM PHOSPHATE 10 MG/ML IJ SOLN
INTRAMUSCULAR | Status: AC
  Filled 2019-07-03: qty 1

## 2019-07-03 MED ORDER — SODIUM CHLORIDE 0.9 % IV SOLN
Freq: Once | INTRAVENOUS | Status: AC
  Administered 2019-07-03: 10:00:00 via INTRAVENOUS
  Filled 2019-07-03: qty 250

## 2019-07-03 MED ORDER — DEXAMETHASONE SODIUM PHOSPHATE 10 MG/ML IJ SOLN
10.0000 mg | Freq: Once | INTRAMUSCULAR | Status: AC
  Administered 2019-07-03: 10:00:00 10 mg via INTRAVENOUS

## 2019-07-03 MED ORDER — SODIUM CHLORIDE 0.9 % IV SOLN
100.0000 mg/m2 | Freq: Once | INTRAVENOUS | Status: AC
  Administered 2019-07-03: 11:00:00 200 mg via INTRAVENOUS
  Filled 2019-07-03: qty 10

## 2019-07-03 NOTE — Patient Instructions (Signed)
[facility] Discharge Instructions for Patients Receiving Chemotherapy  Today you received the following chemotherapy agents Etoposide (VEPESID).  To help prevent nausea and vomiting after your treatment, we encourage you to take your nausea medication as prescribed.   If you develop nausea and vomiting that is not controlled by your nausea medication, call the clinic.   BELOW ARE SYMPTOMS THAT SHOULD BE REPORTED IMMEDIATELY:  *FEVER GREATER THAN 100.5 F  *CHILLS WITH OR WITHOUT FEVER  NAUSEA AND VOMITING THAT IS NOT CONTROLLED WITH YOUR NAUSEA MEDICATION  *UNUSUAL SHORTNESS OF BREATH  *UNUSUAL BRUISING OR BLEEDING  TENDERNESS IN MOUTH AND THROAT WITH OR WITHOUT PRESENCE OF ULCERS  *URINARY PROBLEMS  *BOWEL PROBLEMS  UNUSUAL RASH Items with * indicate a potential emergency and should be followed up as soon as possible.  Feel free to call the clinic should you have any questions or concerns. The clinic phone number is [phone].  Please show the [facility] at check-in to the Emergency Department and triage nurse.  Coronavirus (COVID-19) Are you at risk?  Are you at risk for the Coronavirus (COVID-19)?  To be considered HIGH RISK for Coronavirus (COVID-19), you have to meet the following criteria:  . Traveled to [place], [place], [place], [place] or [place]; or in the [place] to [place], [place], [place], or [place]; and have fever, cough, and shortness of breath within the last 2 weeks of travel OR . Been in close contact with a person diagnosed with COVID-19 within the last 2 weeks and have fever, cough, and shortness of breath . IF YOU DO NOT MEET THESE CRITERIA, YOU ARE CONSIDERED LOW RISK FOR COVID-19.  What to do if you are HIGH RISK for COVID-19?  Marland Kitchen If you are having a medical emergency, call 911. . Seek medical care right away. Before you go to a doctor's office, urgent care or emergency department, call ahead and tell them  about your recent travel, contact with someone diagnosed with COVID-19, and your symptoms. You should receive instructions from your physician's office regarding next steps of care.  . When you arrive at healthcare provider, tell the healthcare staff immediately you have returned from visiting [place], [place], [place], [place] or [place]; or traveled in the [place] to [place], [place], [place], or [place]; in the last two weeks or you have been in close contact with a person diagnosed with COVID-19 in the last 2 weeks.   . Tell the health care staff about your symptoms: fever, cough and shortness of breath. . After you have been seen by a medical provider, you will be either: o Tested for (COVID-19) and discharged home on quarantine except to seek medical care if symptoms worsen, and asked to  - Stay home and avoid contact with others until you get your results (4-5 days)  - Avoid travel on public transportation if possible (such as bus, train, or airplane) or o Sent to the Emergency Department by EMS for evaluation, COVID-19 testing, and possible admission depending on your condition and test results.  What to do if you are LOW RISK for COVID-19?  Reduce your risk of any infection by using the same precautions used for avoiding the common cold or flu:  Marland Kitchen Wash your hands often with soap and warm water for at least 20 seconds.  If soap and water are not readily available, use an alcohol-based hand sanitizer with at least 60% alcohol.  . If coughing or  sneezing, cover your mouth and nose by coughing or sneezing into the elbow areas of your shirt or coat, into a tissue or into your sleeve (not your hands). . Avoid shaking hands with others and consider head nods or verbal greetings only. . Avoid touching your eyes, nose, or mouth with unwashed hands.  . Avoid close contact with people who are sick. . Avoid places or events with large numbers of people in one location, like concerts or  sporting events. . Carefully consider travel plans you have or are making. . If you are planning any travel outside or inside the [place], visit the CDC's Travelers' Health webpage for the latest health notices. . If you have some symptoms but not all symptoms, continue to monitor at home and seek medical attention if your symptoms worsen. . If you are having a medical emergency, call 911.   [place] / e-Visit: [email or web address]         [facility]: [facility]: [phone]                   [facility]: [phone]

## 2019-07-04 ENCOUNTER — Ambulatory Visit: Payer: Medicare PPO

## 2019-07-04 ENCOUNTER — Other Ambulatory Visit: Payer: Self-pay

## 2019-07-04 ENCOUNTER — Inpatient Hospital Stay: Payer: Medicare PPO

## 2019-07-04 VITALS — BP 118/72 | HR 88 | Temp 98.2°F | Resp 18

## 2019-07-04 DIAGNOSIS — C3490 Malignant neoplasm of unspecified part of unspecified bronchus or lung: Secondary | ICD-10-CM

## 2019-07-04 DIAGNOSIS — Z5189 Encounter for other specified aftercare: Secondary | ICD-10-CM | POA: Diagnosis not present

## 2019-07-04 DIAGNOSIS — Z7189 Other specified counseling: Secondary | ICD-10-CM

## 2019-07-04 MED ORDER — PEGFILGRASTIM-JMDB 6 MG/0.6ML ~~LOC~~ SOSY
6.0000 mg | PREFILLED_SYRINGE | Freq: Once | SUBCUTANEOUS | Status: AC
  Administered 2019-07-04: 6 mg via SUBCUTANEOUS
  Filled 2019-07-04: qty 0.6

## 2019-07-04 NOTE — Patient Instructions (Signed)

## 2019-07-05 ENCOUNTER — Other Ambulatory Visit: Payer: Self-pay | Admitting: Nurse Practitioner

## 2019-07-23 ENCOUNTER — Other Ambulatory Visit: Payer: Self-pay

## 2019-07-23 ENCOUNTER — Encounter: Payer: Self-pay | Admitting: Nurse Practitioner

## 2019-07-23 ENCOUNTER — Inpatient Hospital Stay: Payer: Medicare PPO

## 2019-07-23 ENCOUNTER — Telehealth: Payer: Self-pay | Admitting: Nurse Practitioner

## 2019-07-23 ENCOUNTER — Inpatient Hospital Stay (HOSPITAL_BASED_OUTPATIENT_CLINIC_OR_DEPARTMENT_OTHER): Payer: Medicare PPO | Admitting: Nurse Practitioner

## 2019-07-23 VITALS — BP 131/79 | HR 97 | Temp 98.3°F | Resp 18 | Ht 71.0 in | Wt 169.3 lb

## 2019-07-23 DIAGNOSIS — C3490 Malignant neoplasm of unspecified part of unspecified bronchus or lung: Secondary | ICD-10-CM

## 2019-07-23 DIAGNOSIS — Z5189 Encounter for other specified aftercare: Secondary | ICD-10-CM | POA: Diagnosis not present

## 2019-07-23 DIAGNOSIS — Z7189 Other specified counseling: Secondary | ICD-10-CM

## 2019-07-23 LAB — CMP (CANCER CENTER ONLY)
ALT: 8 U/L (ref 0–44)
AST: 11 U/L — ABNORMAL LOW (ref 15–41)
Albumin: 3.1 g/dL — ABNORMAL LOW (ref 3.5–5.0)
Alkaline Phosphatase: 168 U/L — ABNORMAL HIGH (ref 38–126)
Anion gap: 8 (ref 5–15)
BUN: 12 mg/dL (ref 8–23)
CO2: 25 mmol/L (ref 22–32)
Calcium: 9.2 mg/dL (ref 8.9–10.3)
Chloride: 110 mmol/L (ref 98–111)
Creatinine: 1.25 mg/dL — ABNORMAL HIGH (ref 0.61–1.24)
GFR, Est AFR Am: >60 mL/min (ref >60)
GFR, Estimated: 58 mL/min — ABNORMAL LOW (ref >60)
Glucose, Bld: 112 mg/dL — ABNORMAL HIGH (ref 70–99)
Potassium: 4.4 mmol/L (ref 3.5–5.1)
Sodium: 143 mmol/L (ref 135–145)
Total Bilirubin: 0.3 mg/dL (ref 0.3–1.2)
Total Protein: 7.3 g/dL (ref 6.5–8.1)

## 2019-07-23 LAB — CBC WITH DIFFERENTIAL (CANCER CENTER ONLY)
Abs Immature Granulocytes: 0.17 10*3/uL — ABNORMAL HIGH (ref 0.00–0.07)
Basophils Absolute: 0 10*3/uL (ref 0.0–0.1)
Basophils Relative: 0 %
Eosinophils Absolute: 0.1 10*3/uL (ref 0.0–0.5)
Eosinophils Relative: 1 %
HCT: 30 % — ABNORMAL LOW (ref 39.0–52.0)
Hemoglobin: 9.7 g/dL — ABNORMAL LOW (ref 13.0–17.0)
Immature Granulocytes: 1 %
Lymphocytes Relative: 17 %
Lymphs Abs: 2.2 10*3/uL (ref 0.7–4.0)
MCH: 32.4 pg (ref 26.0–34.0)
MCHC: 32.3 g/dL (ref 30.0–36.0)
MCV: 100.3 fL — ABNORMAL HIGH (ref 80.0–100.0)
Monocytes Absolute: 1.3 10*3/uL — ABNORMAL HIGH (ref 0.1–1.0)
Monocytes Relative: 9 %
Neutro Abs: 9.7 10*3/uL — ABNORMAL HIGH (ref 1.7–7.7)
Neutrophils Relative %: 72 %
Platelet Count: 373 10*3/uL (ref 150–400)
RBC: 2.99 MIL/uL — ABNORMAL LOW (ref 4.22–5.81)
RDW: 19.5 % — ABNORMAL HIGH (ref 11.5–15.5)
WBC Count: 13.6 10*3/uL — ABNORMAL HIGH (ref 4.0–10.5)
nRBC: 1.1 % — ABNORMAL HIGH (ref 0.0–0.2)

## 2019-07-23 LAB — TSH: TSH: 1.839 u[IU]/mL (ref 0.320–4.118)

## 2019-07-23 MED ORDER — PALONOSETRON HCL INJECTION 0.25 MG/5ML
INTRAVENOUS | Status: AC
  Filled 2019-07-23: qty 5

## 2019-07-23 MED ORDER — SODIUM CHLORIDE 0.9 % IV SOLN
1200.0000 mg | Freq: Once | INTRAVENOUS | Status: AC
  Administered 2019-07-23: 1200 mg via INTRAVENOUS
  Filled 2019-07-23: qty 20

## 2019-07-23 MED ORDER — TRAMADOL HCL 50 MG PO TABS: 50.0000 mg | ORAL_TABLET | Freq: Two times a day (BID) | ORAL | 0 refills | Status: DC | PRN

## 2019-07-23 MED ORDER — SODIUM CHLORIDE 0.9 % IV SOLN
426.0000 mg | Freq: Once | INTRAVENOUS | Status: AC
  Administered 2019-07-23: 430 mg via INTRAVENOUS
  Filled 2019-07-23: qty 43

## 2019-07-23 MED ORDER — PALONOSETRON HCL INJECTION 0.25 MG/5ML
0.2500 mg | Freq: Once | INTRAVENOUS | Status: AC
  Administered 2019-07-23: 12:00:00 0.25 mg via INTRAVENOUS

## 2019-07-23 MED ORDER — SODIUM CHLORIDE 0.9 % IV SOLN
100.0000 mg/m2 | Freq: Once | INTRAVENOUS | Status: AC
  Administered 2019-07-23: 200 mg via INTRAVENOUS
  Filled 2019-07-23: qty 10

## 2019-07-23 MED ORDER — SODIUM CHLORIDE 0.9 % IV SOLN
Freq: Once | INTRAVENOUS | Status: AC
  Administered 2019-07-23: 13:00:00 via INTRAVENOUS
  Filled 2019-07-23: qty 5

## 2019-07-23 MED ORDER — SODIUM CHLORIDE 0.9 % IV SOLN
Freq: Once | INTRAVENOUS | Status: AC
  Administered 2019-07-23: 12:00:00 via INTRAVENOUS
  Filled 2019-07-23: qty 250

## 2019-07-23 NOTE — Patient Instructions (Signed)
[facility] Discharge Instructions for Patients Receiving Chemotherapy  Today you received the following chemotherapy agents: Atezolizumab (Tecentriq), Etoposide (Vepesid, VP-16), and Carboplatin (Paraplatin)  To help prevent nausea and vomiting after your treatment, we encourage you to take your nausea medication as directed. Received Aloxi during treatment today-->Take your Compazine prescription (not your Zofran) for the next 3 days as needed.    If you develop nausea and vomiting that is not controlled by your nausea medication, call the clinic.   BELOW ARE SYMPTOMS THAT SHOULD BE REPORTED IMMEDIATELY:  *FEVER GREATER THAN 100.5 F  *CHILLS WITH OR WITHOUT FEVER  NAUSEA AND VOMITING THAT IS NOT CONTROLLED WITH YOUR NAUSEA MEDICATION  *UNUSUAL SHORTNESS OF BREATH  *UNUSUAL BRUISING OR BLEEDING  TENDERNESS IN MOUTH AND THROAT WITH OR WITHOUT PRESENCE OF ULCERS  *URINARY PROBLEMS  *BOWEL PROBLEMS  UNUSUAL RASH Items with * indicate a potential emergency and should be followed up as soon as possible.  Feel free to call the clinic should you have any questions or concerns. The clinic phone number is [phone].  Please show the [facility] at check-in to the Emergency Department and triage nurse.  Coronavirus (COVID-19) Are you at risk?  Are you at risk for the Coronavirus (COVID-19)?  To be considered HIGH RISK for Coronavirus (COVID-19), you have to meet the following criteria:  . Traveled to [place], [place], [place], [place] or [place]; or in the [place] to [place], [place], [place], or [place]; and have fever, cough, and shortness of breath within the last 2 weeks of travel OR . Been in close contact with a person diagnosed with COVID-19 within the last 2 weeks and have fever, cough, and shortness of breath . IF YOU DO NOT MEET THESE CRITERIA, YOU ARE CONSIDERED LOW RISK FOR COVID-19.  What to do if you are HIGH RISK for  COVID-19?  Marland Kitchen If you are having a medical emergency, call 911. . Seek medical care right away. Before you go to a doctor's office, urgent care or emergency department, call ahead and tell them about your recent travel, contact with someone diagnosed with COVID-19, and your symptoms. You should receive instructions from your physician's office regarding next steps of care.  . When you arrive at healthcare provider, tell the healthcare staff immediately you have returned from visiting [place], [place], [place], [place] or [place]; or traveled in the [place] to [place], [place], [place], or [place]; in the last two weeks or you have been in close contact with a person diagnosed with COVID-19 in the last 2 weeks.   . Tell the health care staff about your symptoms: fever, cough and shortness of breath. . After you have been seen by a medical provider, you will be either: o Tested for (COVID-19) and discharged home on quarantine except to seek medical care if symptoms worsen, and asked to  - Stay home and avoid contact with others until you get your results (4-5 days)  - Avoid travel on public transportation if possible (such as bus, train, or airplane) or o Sent to the Emergency Department by EMS for evaluation, COVID-19 testing, and possible admission depending on your condition and test results.  What to do if you are LOW RISK for COVID-19?  Reduce your risk of any infection by using the same precautions used for avoiding the common cold or flu:  Marland Kitchen Wash your hands often with soap and warm water for at least 20  seconds.  If soap and water are not readily available, use an alcohol-based hand sanitizer with at least 60% alcohol.  . If coughing or sneezing, cover your mouth and nose by coughing or sneezing into the elbow areas of your shirt or coat, into a tissue or into your sleeve (not your hands). . Avoid shaking hands with others and consider head nods or verbal greetings only. . Avoid  touching your eyes, nose, or mouth with unwashed hands.  . Avoid close contact with people who are sick. . Avoid places or events with large numbers of people in one location, like concerts or sporting events. . Carefully consider travel plans you have or are making. . If you are planning any travel outside or inside the [place], visit the CDC's Travelers' Health webpage for the latest health notices. . If you have some symptoms but not all symptoms, continue to monitor at home and seek medical attention if your symptoms worsen. . If you are having a medical emergency, call 911.   [place] / e-Visit: [email or web address]         [facility]: [facility]: [phone]                   [facility]: [phone]

## 2019-07-23 NOTE — Progress Notes (Signed)
[place]   [phone] Fax:(336) [phone]   Clinic Follow up Note   Patient Care Team: [name], FNP as PCP - General (Internal Medicine) Arna Snipe, RN as Oncology Nurse Navigator 07/23/2019  CHIEF COMPLAINT: f/u metastatic small cell lung cancer   SUMMARY OF ONCOLOGIC HISTORY: Oncology History Overview Note  Cancer Staging Small cell lung cancer ([place]) Staging form: Lung, AJCC 8th Edition - Clinical: Stage IVB (cTX, cN2, pM1c) - Signed by Truitt Merle, MD on 06/06/2019    Small cell lung cancer ([place])  07/11/2018 Imaging   CT AP 07/11/18 IMPRESSION: 1. Significantly improved appearance of the pancreas and peripancreatic tissues. Improved to resolved peripancreatic edema with resolved and significantly improved peripancreatic lesions. The remaining lesion was felt to represent abscess on prior endoscopic ultrasound sampling. Pseudocyst or infected pseudocyst would be the favored imaging diagnosis. 2. Persistent but improved mass effect upon the inferior aspect of the portal vein and superior aspect of the superior mesenteric vein. 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 3. Aortic Atherosclerosis (ICD10-I70.0). Bilateral common iliac artery ectasia. 4. A left adrenal nodule is indeterminate on precontrast imaging and may have decreased in size since the prior. Consider dedicated adrenal protocol CT. This could either be performed in 6 months to confirm size stability or more acutely, depending on clinical concern. 5. A splenic lesion is indeterminate and can be re-evaluated on follow-up. 6. Hepatic morphology suspicious for cirrhosis. 7. Cholelithiasis. 8.  Emphysema (ICD10-J43.9).   05/01/2019 Imaging   CT AP 05/01/19  IMPRESSION: 1. New diffuse liver metastases. 2. New mild retroperitoneal lymphadenopathy, consistent with metastatic disease. 3. Resolution of previously seen pancreatic mass since  previous study. No radiographic evidence of acute pancreatitis. 4. Stable left adrenal mass, consistent with adrenal adenoma. 5. Stable markedly enlarged prostate and findings of chronic bladder outlet obstruction. 6. Colonic diverticulosis. No radiographic evidence of diverticulitis. 7. Cholelithiasis.   Aortic Atherosclerosis (ICD10-I70.0).   05/28/2019 PET scan   PET 05/28/19  IMPRESSION: 1. Examination is positive for widespread FDG avid liver metastases. Liver lesions are too numerous to count. 2. Hypermetabolic mediastinal and upper abdominal nodal metastases. 3. Left upper lobe and right upper lobe hypermetabolic pulmonary nodules consistent with pulmonary metastasis. 4. Innumerable liver metastases noted within the axial and proximal appendicular skeleton. Bone lesions are too numerous to count. Some of these have a corresponding lytic lesion on CT images. Others appear occult on the corresponding CT images. 5. Solid hyperdense left adrenal nodule is not significantly changed from 05/01/2019 and exhibits mild FDG uptake. Indeterminate. 6. Prostate gland enlargement. Focal area of increased uptake within the left posterior gland is indeterminate.   06/04/2019 Initial Biopsy   Biopsy 06/04/19  Diagnosis Liver, needle/core biopsy - SMALL CELL CARCINOMA. SEE COMMENT.   06/06/2019 Initial Diagnosis   Small cell lung cancer ([place])   06/06/2019 Cancer Staging   Staging form: Lung, AJCC 8th Edition - Clinical: Stage IVB (cTX, cN2, pM1c) - Signed by Truitt Merle, MD on 06/06/2019   06/10/2019 -  Chemotherapy   Carboplatin Day 1 and Etopside day 1-3 with immunotherapy atezolizumab (Tecentriq) on day 1, every 3 weeks for 6 cycles starting 06/10/19. Plan to continue maintenance Tecentriq afterward.    06/14/2019 Imaging    MRI brain 06/14/19  IMPRESSION: No brain metastases. Chronic small-vessel ischemic changes of the pons and cerebral hemispheric white matter.   Probable metastatic  lesions within the upper cervical spine as seen. No sign of spinal canal encroachment.  CURRENT THERAPY: First lineCarboplatin Day 1 and Etopside day 1-3 with immunotherapyatezolizumab(Tecentriq) on day 1, every 3 weeks for 6 cyclesstarting 06/10/19.  INTERVAL HISTORY: Mr. Wessinger returns for f/u and cycle 3 chemo as scheduled. He completed cycle 2 from 8/3 - 8/5 and Udenyca. He denies bone pain with injection. He feels well. Has fatigue 1 day after treatment. Denies n/v with home meds. Bowels moving normally. Denies decreased appetite. No recent fever or chills. Denies cough, chest pain, dyspnea, or leg swelling. Nicotine patch is helping him quit smoking, down to 1/2 PPD. He has worsening left neck stiffness. He denies known injury or strain. Pain 7/10. Has been applying rubbing alcohol and icy hot and taking aspirin without much relief. Pain is worse on turning head to left and right. Denies upper extremity weakness or other spinal or bone pain. Has tingling in first finger on left hand, denies other neuropathy.   MEDICAL HISTORY:  Past Medical History:  Diagnosis Date  . Cancer ([place])   . GERD (gastroesophageal reflux disease)   . Hyperlipemia   . Hypertension     SURGICAL HISTORY: Past Surgical History:  Procedure Laterality Date  . arm fracture surgery Right   . colon polyps removed    . ESOPHAGOGASTRODUODENOSCOPY (EGD) WITH PROPOFOL N/A 06/14/2018   Procedure: ESOPHAGOGASTRODUODENOSCOPY (EGD) WITH PROPOFOL;  Surgeon: [name], MD;  Location: [facility];  Service: Endoscopy;  Laterality: N/A;  . EUS N/A 06/14/2018   Procedure: ESOPHAGEAL ENDOSCOPIC ULTRASOUND (EUS) RADIAL;  Surgeon: [name], MD;  Location: [facility];  Service: Endoscopy;  Laterality: N/A;  . FINE NEEDLE ASPIRATION N/A 06/14/2018   Procedure: FINE NEEDLE ASPIRATION (FNA) LINEAR;  Surgeon: [name], MD;  Location: [facility];  Service: Endoscopy;  Laterality: N/A;  . TONSILLECTOMY       I have reviewed the social history and family history with the patient and they are unchanged from previous note.  ALLERGIES:  has No Known Allergies.  MEDICATIONS:  Current Outpatient Medications  Medication Sig Dispense Refill  . allopurinol (ZYLOPRIM) 300 MG tablet TAKE 1 TABLET BY MOUTH TWICE A DAY 60 tablet 0  . amLODipine (NORVASC) 10 MG tablet Take 1 tablet (10 mg total) by mouth daily. 90 tablet 0  . aspirin-sod bicarb-citric acid (ALKA-SELTZER) 325 MG TBEF tablet Take 650 mg by mouth every 6 (six) hours as needed.    . nicotine (NICODERM CQ - DOSED IN MG/24 HOURS) 14 mg/24hr patch Place 1 patch (14 mg total) onto the skin daily. 28 patch 0  . Omega-3 Fatty Acids (FISH OIL) 1000 MG CAPS Take 1 capsule by mouth daily.    . ondansetron (ZOFRAN) 8 MG tablet Take 1 tablet (8 mg total) by mouth 2 (two) times daily as needed for refractory nausea / vomiting. Start on day 3 after carboplatin chemo. 30 tablet 1  . pantoprazole (PROTONIX) 20 MG tablet TAKE 1 TABLET BY MOUTH EVERY DAY 30 tablet 0  . prochlorperazine (COMPAZINE) 10 MG tablet Take 1 tablet (10 mg total) by mouth every 6 (six) hours as needed (Nausea or vomiting). 30 tablet 1  . traMADol (ULTRAM) 50 MG tablet Take 1 tablet (50 mg total) by mouth every 12 (twelve) hours as needed. 30 tablet 0   No current facility-administered medications for this visit.    Facility-Administered Medications Ordered in Other Visits  Medication Dose Route Frequency Provider Last Rate Last Dose  . atezolizumab (TECENTRIQ) 1,200 mg in sodium chloride 0.9 % 250 mL chemo infusion  1,200 mg Intravenous Once Truitt Merle, MD      . CARBOplatin (PARAPLATIN) 430 mg in sodium chloride 0.9 % 250 mL chemo infusion  430 mg Intravenous Once Truitt Merle, MD      . etoposide (VEPESID) 200 mg in sodium chloride 0.9 % 500 mL chemo infusion  100 mg/m2 (Treatment Plan Recorded) Intravenous Once Truitt Merle, MD        PHYSICAL EXAMINATION: ECOG PERFORMANCE STATUS: 1 -  Symptomatic but completely ambulatory  Vitals:   07/23/19 1102  BP: 131/79  Pulse: 97  Resp: 18  Temp: 98.3 F (36.8 C)  SpO2: 99%   Filed Weights   07/23/19 1102  Weight: 169 lb 4.8 oz (76.8 kg)    GENERAL:alert, no distress and comfortable SKIN: no rash. Darkening over vein of right forearm  EYES:  sclera clear NECK: without mass  LYMPH:  no palpable cervical or supraclavicular lymphadenopathy LUNGS: distant breath sounds, normal breathing effort HEART: regular rate & rhythm, no lower extremity edema ABDOMEN:abdomen soft, non-tender and normal bowel sounds Musculoskeletal: C spine tenderness on rotation, none on flexion or extension. No C spine tenderness on palpation. No other palpable spinal tenderness or abnormality  NEURO: alert & oriented x 3 with fluent speech, normal gait   LABORATORY DATA:  I have reviewed the data as listed CBC Latest Ref Rng & Units 07/23/2019 07/01/2019 06/20/2019  WBC 4.0 - 10.5 K/uL 13.6(H) 14.1(H) 4.4  Hemoglobin 13.0 - 17.0 g/dL 9.7(L) 12.1(L) 12.9(L)  Hematocrit 39.0 - 52.0 % 30.0(L) 35.6(L) 38.0(L)  Platelets 150 - 400 K/uL 373 367 85(L)     CMP Latest Ref Rng & Units 07/23/2019 07/01/2019 06/20/2019  Glucose 70 - 99 mg/dL 112(H) 145(H) 122(H)  BUN 8 - 23 mg/dL _0 Creatinine 0.61 - 1.24 mg/dL 1.25(H) 1.09 0.94  Sodium 135 - 145 mmol/L 143 139 142  Potassium 3.5 - 5.1 mmol/L 4.4 3.8 3.8  Chloride 98 - 111 mmol/L 110 107 111  CO2 22 - 32 mmol/L 25 23 19(L)  Calcium 8.9 - 10.3 mg/dL 9.2 9.4 9.3  Total Protein 6.5 - 8.1 g/dL 7.3 7.4 6.9  Total Bilirubin 0.3 - 1.2 mg/dL 0.3 0.5 0.4  Alkaline Phos 38 - 126 U/L 168(H) 223(H) 293(H)  AST 15 - 41 U/L 11(L) 15 26  ALT 0 - 44 U/L 8 14 44      RADIOGRAPHIC STUDIES: I have personally reviewed the radiological images as listed and agreed with the findings in the report. No results found.   ASSESSMENT & PLAN: Jeremy Mack is a 73 y.o. male with   1.Small Cell Lung Cancer,Right lung  primary,Metastatic to liver,left lung, LNs andbone -PET scan with diffuse metastatic cancer in liver, upper abdominal nodal mets,bilateral lung lesion, right mediastinal nodes and diffuse bone mets. -Liverbiopsyfrom 06/04/19 which shows metastatic Small Cell lung cancer,TTF(+). This is most consistent with lung primary.Given his mediastinal lymph node distribution, right lung lesion likely the primary tumor. -No brain metastasis as seen on 06/14/19 brain MRI.  -He began systemic first lineCarboplatin on day 1 and Etopside on day 1-3 with immunotherapy atezolizumab(Tecentriq) on day 1 treatment every 3 weeks for 6 cycles, based on the NCCNguideline. -goal of treatment is palliative  -For tumor lysis syndrome prevention he is on allopurinol  -Plan per [name]: restage after cycle 3 to monitor response. If he has good response or stable disease, will continue for a total of 6 cycles then switch to Atezolizumab maintenance therapy after cycle  6.   2. AKI -06/04/19 Cr at 1.70, previously normal.Possible related to dehydration vs mild tumor lysis -He is currently on Allopurinol with treatment. -Cr normalized, but is again slightly elevated today 1.25 (07/23/19)  3. H/o pancreatic abscess  -imaging in 05/2018 suspicious for neoplasm, CA 19-9 was 1 -EUS and FNA per Dr. Ardis Hughs showed inflammation, no malignancy -s/p two 3-week courses of Augmentin in 05/2018 for pancreatic abscess, f/u CT showed near resolution of pancreatic abnormality -no pancreas mass on 04/2019 CT -resolved   4. SmokingCessation,H/oalcohol use  -He reports a past history of alcohol use for 10-15 years, he does not drink now. -CT in 05/2018 was concerning for hepatic cirrhosis -has 30 year smoking history.  -Liver biopsy from 05/2019 shows metastatic small Cell Lung cancer, Likely related to his heavy smoking history.  -nicotine patch is helping him quit   5. Enlarged prostate, elevated PSA  -PSA in 03/2019 9.47,  up from 6.8 two years ago -enlarged prostate on CT  -He was previously referred to urology by PCP  6.Goal of care discussion  -The patient understands the goal of care is palliative. -he is full code now  Disposition: Mr. Lunden appears stable. He completed 2 cycles of chemotherapy with carboplatin, etoposide and tecentriq. He tolerates treatment well overall with mild fatigue and nausea that is well controlled with anti-emetics. Labs reviewed, CBC and CMP adequate for treatment. Cr. Increased to 1.25. He had remote AKI after cycle 1 which resolved. He is on allopurinol for TLS prevention. He drinks mostly coffee. I encouraged him to drink more water. Also has been using more aspirin for neck pain. I recommend to avoid NSAIDs. He will proceed with cycle 3 today. Will repeat CMP to monitor renal function on 8/27.   His neck pain is possibly related to lytic lesion within C5 vertebra noted on PET. Exam shows tenderness with ROM. I recommend he avoid NSAIDs due to potential renal toxicity. Will prescribe tramadol 1 tab q12 PRN. We reviewed possible side effects of tramadol. I recommend he not drive while taking this. He understands. Avoiding steroids as he is on Tecentriq. Will include MRI C spine with restaging scans. Will also discuss with rad onc.   He will return for f/u and review of imaging in 3 weeks. If scan shows good response or is stable, plan to proceed with cycle 4 at next visit. I also spoke with his wife today who called me after the visit.    Orders Placed This Encounter  Procedures  . CT Abdomen Pelvis W Contrast    Standing Status:   Future    Standing Expiration Date:   07/22/2020    Order Specific Question:   ** REASON FOR EXAM (FREE TEXT)    Answer:   restaging    Order Specific Question:   If indicated for the ordered procedure, I authorize the administration of contrast media per Radiology protocol    Answer:   Yes    Order Specific Question:   Preferred imaging location?     Answer:   [facility]    Order Specific Question:   Is Oral Contrast requested for this exam?    Answer:   Yes, Per Radiology protocol    Order Specific Question:   Radiology Contrast Protocol - do NOT remove file path    Answer:   \\[email or web address].pdf  . CT Chest W Contrast    Standing Status:   Future    Standing Expiration Date:   07/22/2020  Order Specific Question:   ** REASON FOR EXAM (FREE TEXT)    Answer:   restaging    Order Specific Question:   If indicated for the ordered procedure, I authorize the administration of contrast media per Radiology protocol    Answer:   Yes    Order Specific Question:   Preferred imaging location?    Answer:   [facility]    Order Specific Question:   Radiology Contrast Protocol - do NOT remove file path    Answer:   \\[email or web address]  . MR CERVICAL SPINE W WO CONTRAST    Standing Status:   Future    Standing Expiration Date:   09/21/2020    Order Specific Question:   ** REASON FOR EXAM (FREE TEXT)    Answer:   wrosening neck pain. C5 met on PET scan    Order Specific Question:   If indicated for the ordered procedure, I authorize the administration of contrast media per Radiology protocol    Answer:   Yes    Order Specific Question:   What is the patient's sedation requirement?    Answer:   No Sedation    Order Specific Question:   Does the patient have a pacemaker or implanted devices?    Answer:   No    Order Specific Question:   Use SRS Protocol?    Answer:   No    Order Specific Question:   Radiology Contrast Protocol - do NOT remove file path    Answer:   \\[email or web address]    Order Specific Question:   Preferred imaging location?    Answer:   [facility] (table limit-350 lbs)  . CMP ([place] only)    Standing Status:   Future    Standing Expiration Date:   07/22/2020   All questions were answered. The patient knows to  call the clinic with any problems, questions or concerns. No barriers to learning was detected. I spent 20 minutes counseling the patient face to face. The total time spent in the appointment was 25 minutes and more than 50% was on counseling and review of test results     Alla Feeling, NP 07/23/19

## 2019-07-23 NOTE — Telephone Encounter (Signed)
Scheduled appt per 8/25 los.  Called patient and spoke with the patient spouse and she is aware of the appt date and time.

## 2019-07-24 ENCOUNTER — Telehealth: Payer: Self-pay | Admitting: Family

## 2019-07-24 ENCOUNTER — Other Ambulatory Visit: Payer: Self-pay

## 2019-07-24 ENCOUNTER — Ambulatory Visit: Payer: Medicare PPO

## 2019-07-24 ENCOUNTER — Inpatient Hospital Stay: Payer: Medicare PPO

## 2019-07-24 VITALS — BP 133/75 | HR 98 | Temp 98.3°F | Resp 17

## 2019-07-24 DIAGNOSIS — Z7189 Other specified counseling: Secondary | ICD-10-CM

## 2019-07-24 DIAGNOSIS — Z5189 Encounter for other specified aftercare: Secondary | ICD-10-CM | POA: Diagnosis not present

## 2019-07-24 DIAGNOSIS — C3490 Malignant neoplasm of unspecified part of unspecified bronchus or lung: Secondary | ICD-10-CM

## 2019-07-24 MED ORDER — DEXAMETHASONE SODIUM PHOSPHATE 10 MG/ML IJ SOLN
INTRAMUSCULAR | Status: AC
  Filled 2019-07-24: qty 1

## 2019-07-24 MED ORDER — SODIUM CHLORIDE 0.9 % IV SOLN
Freq: Once | INTRAVENOUS | Status: AC
  Administered 2019-07-24: 08:00:00 via INTRAVENOUS
  Filled 2019-07-24: qty 250

## 2019-07-24 MED ORDER — SODIUM CHLORIDE 0.9 % IV SOLN
100.0000 mg/m2 | Freq: Once | INTRAVENOUS | Status: AC
  Administered 2019-07-24: 200 mg via INTRAVENOUS
  Filled 2019-07-24: qty 10

## 2019-07-24 MED ORDER — DEXAMETHASONE SODIUM PHOSPHATE 10 MG/ML IJ SOLN
10.0000 mg | Freq: Once | INTRAMUSCULAR | Status: AC
  Administered 2019-07-24: 09:00:00 10 mg via INTRAVENOUS

## 2019-07-24 NOTE — Patient Instructions (Signed)
[facility] Discharge Instructions for Patients Receiving Chemotherapy  Today you received the following chemotherapy agents: Etoposide  To help prevent nausea and vomiting after your treatment, we encourage you to take your nausea medication as directed.   If you develop nausea and vomiting that is not controlled by your nausea medication, call the clinic.   BELOW ARE SYMPTOMS THAT SHOULD BE REPORTED IMMEDIATELY:  *FEVER GREATER THAN 100.5 F  *CHILLS WITH OR WITHOUT FEVER  NAUSEA AND VOMITING THAT IS NOT CONTROLLED WITH YOUR NAUSEA MEDICATION  *UNUSUAL SHORTNESS OF BREATH  *UNUSUAL BRUISING OR BLEEDING  TENDERNESS IN MOUTH AND THROAT WITH OR WITHOUT PRESENCE OF ULCERS  *URINARY PROBLEMS  *BOWEL PROBLEMS  UNUSUAL RASH Items with * indicate a potential emergency and should be followed up as soon as possible.  Feel free to call the clinic should you have any questions or concerns. The clinic phone number is [phone].  Please show the [place] at check-in to the Emergency Department and triage nurse.

## 2019-07-24 NOTE — Telephone Encounter (Signed)
His pharmacy sent a refill request for Amlodipine 5 mg but according to our records he is on 10 mg daily. Please have him verify what dosage he has been taking. His blood pressure has looked much better so I want to keep him on the correct dosage.

## 2019-07-25 ENCOUNTER — Other Ambulatory Visit: Payer: Self-pay

## 2019-07-25 ENCOUNTER — Other Ambulatory Visit: Payer: Self-pay | Admitting: Nurse Practitioner

## 2019-07-25 ENCOUNTER — Inpatient Hospital Stay: Payer: Medicare PPO

## 2019-07-25 ENCOUNTER — Ambulatory Visit: Payer: Medicare PPO

## 2019-07-25 VITALS — BP 137/82 | HR 99 | Temp 99.1°F | Resp 17

## 2019-07-25 DIAGNOSIS — C3490 Malignant neoplasm of unspecified part of unspecified bronchus or lung: Secondary | ICD-10-CM

## 2019-07-25 DIAGNOSIS — Z5189 Encounter for other specified aftercare: Secondary | ICD-10-CM | POA: Diagnosis not present

## 2019-07-25 DIAGNOSIS — Z7189 Other specified counseling: Secondary | ICD-10-CM

## 2019-07-25 LAB — CMP (CANCER CENTER ONLY)
ALT: 10 U/L (ref 0–44)
AST: 12 U/L — ABNORMAL LOW (ref 15–41)
Albumin: 3.5 g/dL (ref 3.5–5.0)
Alkaline Phosphatase: 159 U/L — ABNORMAL HIGH (ref 38–126)
Anion gap: 11 (ref 5–15)
BUN: 23 mg/dL (ref 8–23)
CO2: 23 mmol/L (ref 22–32)
Calcium: 9.4 mg/dL (ref 8.9–10.3)
Chloride: 106 mmol/L (ref 98–111)
Creatinine: 1.33 mg/dL — ABNORMAL HIGH (ref 0.61–1.24)
GFR, Est AFR Am: >60 mL/min (ref >60)
GFR, Estimated: 53 mL/min — ABNORMAL LOW (ref >60)
Glucose, Bld: 99 mg/dL (ref 70–99)
Potassium: 4.2 mmol/L (ref 3.5–5.1)
Sodium: 140 mmol/L (ref 135–145)
Total Bilirubin: 0.3 mg/dL (ref 0.3–1.2)
Total Protein: 7.6 g/dL (ref 6.5–8.1)

## 2019-07-25 LAB — URIC ACID: Uric Acid, Serum: 4.4 mg/dL (ref 3.7–8.6)

## 2019-07-25 MED ORDER — DEXAMETHASONE SODIUM PHOSPHATE 10 MG/ML IJ SOLN
10.0000 mg | Freq: Once | INTRAMUSCULAR | Status: AC
  Administered 2019-07-25: 10 mg via INTRAVENOUS

## 2019-07-25 MED ORDER — SODIUM CHLORIDE 0.9% FLUSH
10.0000 mL | INTRAVENOUS | Status: DC | PRN
  Filled 2019-07-25: qty 10

## 2019-07-25 MED ORDER — SODIUM CHLORIDE 0.9 % IV SOLN
Freq: Once | INTRAVENOUS | Status: AC
  Administered 2019-07-25: 09:00:00 via INTRAVENOUS
  Filled 2019-07-25: qty 250

## 2019-07-25 MED ORDER — SODIUM CHLORIDE 0.9 % IV SOLN
100.0000 mg/m2 | Freq: Once | INTRAVENOUS | Status: AC
  Administered 2019-07-25: 10:00:00 200 mg via INTRAVENOUS
  Filled 2019-07-25: qty 10

## 2019-07-25 MED ORDER — HEPARIN SOD (PORK) LOCK FLUSH 100 UNIT/ML IV SOLN
500.0000 [IU] | Freq: Once | INTRAVENOUS | Status: DC | PRN
  Filled 2019-07-25: qty 5

## 2019-07-25 MED ORDER — DEXAMETHASONE SODIUM PHOSPHATE 10 MG/ML IJ SOLN
INTRAMUSCULAR | Status: AC
  Filled 2019-07-25: qty 1

## 2019-07-25 NOTE — Patient Instructions (Signed)
[facility] Discharge Instructions for Patients Receiving Chemotherapy  Today you received the following chemotherapy agents:Etoposide (Vepesid, VP-16)  To help prevent nausea and vomiting after your treatment, we encourage you to take your nausea medication as directed. Received Aloxi during treatment today-->Take your Compazine prescription (not your Zofran) for the next 3 days as needed.    If you develop nausea and vomiting that is not controlled by your nausea medication, call the clinic.   BELOW ARE SYMPTOMS THAT SHOULD BE REPORTED IMMEDIATELY:  *FEVER GREATER THAN 100.5 F  *CHILLS WITH OR WITHOUT FEVER  NAUSEA AND VOMITING THAT IS NOT CONTROLLED WITH YOUR NAUSEA MEDICATION  *UNUSUAL SHORTNESS OF BREATH  *UNUSUAL BRUISING OR BLEEDING  TENDERNESS IN MOUTH AND THROAT WITH OR WITHOUT PRESENCE OF ULCERS  *URINARY PROBLEMS  *BOWEL PROBLEMS  UNUSUAL RASH Items with * indicate a potential emergency and should be followed up as soon as possible.  Feel free to call the clinic should you have any questions or concerns. The clinic phone number is [phone].  Please show the [facility] at check-in to the Emergency Department and triage nurse.  Coronavirus (COVID-19) Are you at risk?  Are you at risk for the Coronavirus (COVID-19)?  To be considered HIGH RISK for Coronavirus (COVID-19), you have to meet the following criteria:  . Traveled to [place], [place], [place], [place] or [place]; or in the [place] to [place], [place], [place], or [place]; and have fever, cough, and shortness of breath within the last 2 weeks of travel OR . Been in close contact with a person diagnosed with COVID-19 within the last 2 weeks and have fever, cough, and shortness of breath . IF YOU DO NOT MEET THESE CRITERIA, YOU ARE CONSIDERED LOW RISK FOR COVID-19.  What to do if you are HIGH RISK for COVID-19?  Marland Kitchen If you are having a medical emergency, call 911. . Seek  medical care right away. Before you go to a doctor's office, urgent care or emergency department, call ahead and tell them about your recent travel, contact with someone diagnosed with COVID-19, and your symptoms. You should receive instructions from your physician's office regarding next steps of care.  . When you arrive at healthcare provider, tell the healthcare staff immediately you have returned from visiting [place], [place], [place], [place] or [place]; or traveled in the [place] to [place], [place], [place], or [place]; in the last two weeks or you have been in close contact with a person diagnosed with COVID-19 in the last 2 weeks.   . Tell the health care staff about your symptoms: fever, cough and shortness of breath. . After you have been seen by a medical provider, you will be either: o Tested for (COVID-19) and discharged home on quarantine except to seek medical care if symptoms worsen, and asked to  - Stay home and avoid contact with others until you get your results (4-5 days)  - Avoid travel on public transportation if possible (such as bus, train, or airplane) or o Sent to the Emergency Department by EMS for evaluation, COVID-19 testing, and possible admission depending on your condition and test results.  What to do if you are LOW RISK for COVID-19?  Reduce your risk of any infection by using the same precautions used for avoiding the common cold or flu:  Marland Kitchen Wash your hands often with soap and warm water for at least 20 seconds.  If soap and water  are not readily available, use an alcohol-based hand sanitizer with at least 60% alcohol.  . If coughing or sneezing, cover your mouth and nose by coughing or sneezing into the elbow areas of your shirt or coat, into a tissue or into your sleeve (not your hands). . Avoid shaking hands with others and consider head nods or verbal greetings only. . Avoid touching your eyes, nose, or mouth with unwashed hands.  . Avoid close  contact with people who are sick. . Avoid places or events with large numbers of people in one location, like concerts or sporting events. . Carefully consider travel plans you have or are making. . If you are planning any travel outside or inside the [place], visit the CDC's Travelers' Health webpage for the latest health notices. . If you have some symptoms but not all symptoms, continue to monitor at home and seek medical attention if your symptoms worsen. . If you are having a medical emergency, call 911.   [place] / e-Visit: [email or web address]         [facility]: [facility]: [phone]                   [facility]: [phone]

## 2019-07-26 ENCOUNTER — Other Ambulatory Visit: Payer: Self-pay

## 2019-07-26 ENCOUNTER — Inpatient Hospital Stay: Payer: Medicare PPO

## 2019-07-26 VITALS — BP 126/77 | HR 95 | Temp 98.2°F | Resp 16

## 2019-07-26 DIAGNOSIS — Z7189 Other specified counseling: Secondary | ICD-10-CM

## 2019-07-26 DIAGNOSIS — C3490 Malignant neoplasm of unspecified part of unspecified bronchus or lung: Secondary | ICD-10-CM

## 2019-07-26 DIAGNOSIS — Z5189 Encounter for other specified aftercare: Secondary | ICD-10-CM | POA: Diagnosis not present

## 2019-07-26 MED ORDER — PEGFILGRASTIM-JMDB 6 MG/0.6ML ~~LOC~~ SOSY
6.0000 mg | PREFILLED_SYRINGE | Freq: Once | SUBCUTANEOUS | Status: AC
  Administered 2019-07-26: 6 mg via SUBCUTANEOUS
  Filled 2019-07-26: qty 0.6

## 2019-07-26 MED ORDER — SODIUM CHLORIDE 0.9 % IV SOLN
INTRAVENOUS | Status: DC
  Administered 2019-07-26: 15:00:00 via INTRAVENOUS
  Filled 2019-07-26 (×2): qty 250

## 2019-07-26 NOTE — Patient Instructions (Signed)
Rehydration, Adult Rehydration is the replacement of body fluids and salts and minerals (electrolytes) that are lost during dehydration. Dehydration is when there is not enough fluid or water in the body. This happens when you lose more fluids than you take in. Common causes of dehydration include:  Vomiting.  Diarrhea.  Excessive sweating, such as from heat exposure or exercise.  Taking medicines that cause the body to lose excess fluid (diuretics).  Impaired kidney function.  Not drinking enough fluid.  Certain illnesses or infections.  Certain poorly controlled long-term (chronic) illnesses, such as diabetes, heart disease, and kidney disease.  Symptoms of mild dehydration may include thirst, dry lips and mouth, dry skin, and dizziness. Symptoms of severe dehydration may include increased heart rate, confusion, fainting, and not urinating. You can rehydrate by drinking certain fluids or getting fluids through an IV tube, as told by your health care provider. What are the risks? Generally, rehydration is safe. However, one problem that can happen is taking in too much fluid (overhydration). This is rare. If overhydration happens, it can cause an electrolyte imbalance, kidney failure, or a decrease in salt (sodium) levels in the body. How to rehydrate Follow instructions from your health care provider for rehydration. The kind of fluid you should drink and the amount you should drink depend on your condition.  If directed by your health care provider, drink an oral rehydration solution (ORS). This is a drink designed to treat dehydration that is found in pharmacies and retail stores. ? Make an ORS by following instructions on the package. ? Start by drinking small amounts, about  cup (120 mL) every 5-10 minutes. ? Slowly increase how much you drink until you have taken the amount recommended by your health care provider.  Drink enough clear fluids to keep your urine clear or pale  yellow. If you were instructed to drink an ORS, finish the ORS first, then start slowly drinking other clear fluids. Drink fluids such as: ? Water. Do not drink only water. Doing that can lead to having too little sodium in your body (hyponatremia). ? Ice chips. ? Fruit juice that you have added water to (diluted juice). ? Low-calorie sports drinks.  If you are severely dehydrated, your health care provider may recommend that you receive fluids through an IV tube in the hospital.  Do not take sodium tablets. Doing that can lead to the condition of having too much sodium in your body (hypernatremia). Eating while you rehydrate Follow instructions from your health care provider about what to eat while you rehydrate. Your health care provider may recommend that you slowly begin eating regular foods in small amounts.  Eat foods that contain a healthy balance of electrolytes, such as bananas, oranges, potatoes, tomatoes, and spinach.  Avoid foods that are greasy or contain a lot of fat or sugar.  In some cases, you may get nutrition through a feeding tube that is passed through your nose and into your stomach (nasogastric tube, or NG tube). This may be done if you have uncontrolled vomiting or diarrhea. Beverages to avoid Certain beverages may make dehydration worse. While you rehydrate, avoid:  Alcohol.  Caffeine.  Drinks that contain a lot of sugar. These include: ? High-calorie sports drinks. ? Fruit juice that is not diluted. ? Soda.  Check nutrition labels to see how much sugar or caffeine a beverage contains. Signs of dehydration recovery You may be recovering from dehydration if:  You are urinating more often than before you started  rehydrating.  Your urine is clear or pale yellow.  Your energy level improves.  You vomit less frequently.  You have diarrhea less frequently.  Your appetite improves or returns to normal.  You feel less dizzy or less light-headed.  Your  skin tone and color start to look more normal. Contact a health care provider if:  You continue to have symptoms of mild dehydration, such as: ? Thirst. ? Dry lips. ? Slightly dry mouth. ? Dry, warm skin. ? Dizziness.  You continue to vomit or have diarrhea. Get help right away if:  You have symptoms of dehydration that get worse.  You feel: ? Confused. ? Weak. ? Like you are going to faint.  You have not urinated in 6-8 hours.  You have very dark urine.  You have trouble breathing.  Your heart rate while sitting still is over 100 beats a minute.  You cannot drink fluids without vomiting.  You have vomiting or diarrhea that: ? Gets worse. ? Does not go away.  You have a fever. This information is not intended to replace advice given to you by your health care provider. Make sure you discuss any questions you have with your health care provider. Document Released: 02/06/2012 Document Revised: 10/27/2017 Document Reviewed: 01/08/2016 Elsevier Patient Education  [place].  Pegfilgrastim injection What is this medicine? PEGFILGRASTIM (PEG fil gra stim) is a long-acting granulocyte colony-stimulating factor that stimulates the growth of neutrophils, a type of white blood cell important in the body's fight against infection. It is used to reduce the incidence of fever and infection in patients with certain types of cancer who are receiving chemotherapy that affects the bone marrow, and to increase survival after being exposed to high doses of radiation. This medicine may be used for other purposes; ask your health care provider or pharmacist if you have questions. COMMON BRAND NAME(S): Steve Rattler, Ziextenzo What should I tell my health care provider before I take this medicine? They need to know if you have any of these conditions:  kidney disease  latex allergy  ongoing radiation therapy  sickle cell disease  skin reactions to acrylic  adhesives (On-Body Injector only)  an unusual or allergic reaction to pegfilgrastim, filgrastim, other medicines, foods, dyes, or preservatives  pregnant or trying to get pregnant  breast-feeding How should I use this medicine? This medicine is for injection under the skin. If you get this medicine at home, you will be taught how to prepare and give the pre-filled syringe or how to use the On-body Injector. Refer to the patient Instructions for Use for detailed instructions. Use exactly as directed. Tell your healthcare provider immediately if you suspect that the On-body Injector may not have performed as intended or if you suspect the use of the On-body Injector resulted in a missed or partial dose. It is important that you put your used needles and syringes in a special sharps container. Do not put them in a trash can. If you do not have a sharps container, call your pharmacist or healthcare provider to get one. Talk to your pediatrician regarding the use of this medicine in children. While this drug may be prescribed for selected conditions, precautions do apply. Overdosage: If you think you have taken too much of this medicine contact a poison control center or emergency room at once. NOTE: This medicine is only for you. Do not share this medicine with others. What if I miss a dose? It is important not to  miss your dose. Call your doctor or health care professional if you miss your dose. If you miss a dose due to an On-body Injector failure or leakage, a new dose should be administered as soon as possible using a single prefilled syringe for manual use. What may interact with this medicine? Interactions have not been studied. Give your health care provider a list of all the medicines, herbs, non-prescription drugs, or dietary supplements you use. Also tell them if you smoke, drink alcohol, or use illegal drugs. Some items may interact with your medicine. This list may not describe all possible  interactions. Give your health care provider a list of all the medicines, herbs, non-prescription drugs, or dietary supplements you use. Also tell them if you smoke, drink alcohol, or use illegal drugs. Some items may interact with your medicine. What should I watch for while using this medicine? You may need blood work done while you are taking this medicine. If you are going to need a MRI, CT scan, or other procedure, tell your doctor that you are using this medicine (On-Body Injector only). What side effects may I notice from receiving this medicine? Side effects that you should report to your doctor or health care professional as soon as possible:  allergic reactions like skin rash, itching or hives, swelling of the face, lips, or tongue  back pain  dizziness  fever  pain, redness, or irritation at site where injected  pinpoint red spots on the skin  red or dark-brown urine  shortness of breath or breathing problems  stomach or side pain, or pain at the shoulder  swelling  tiredness  trouble passing urine or change in the amount of urine Side effects that usually do not require medical attention (report to your doctor or health care professional if they continue or are bothersome):  bone pain  muscle pain This list may not describe all possible side effects. Call your doctor for medical advice about side effects. You may report side effects to FDA at 1-800-FDA-1088. Where should I keep my medicine? Keep out of the reach of children. If you are using this medicine at home, you will be instructed on how to store it. Throw away any unused medicine after the expiration date on the label. NOTE: This sheet is a summary. It may not cover all possible information. If you have questions about this medicine, talk to your doctor, pharmacist, or health care provider.  2020 Elsevier/Gold Standard (2018-02-19 16:57:08)

## 2019-07-30 ENCOUNTER — Ambulatory Visit (HOSPITAL_COMMUNITY)
Admission: RE | Admit: 2019-07-30 | Discharge: 2019-07-30 | Disposition: A | Discharge status: Home / Self Care | Payer: Medicare PPO | Source: Ambulatory Visit | Attending: Nurse Practitioner | Admitting: Nurse Practitioner

## 2019-07-30 ENCOUNTER — Encounter (HOSPITAL_COMMUNITY): Payer: Self-pay

## 2019-07-30 ENCOUNTER — Inpatient Hospital Stay: Payer: Medicare PPO | Attending: Nurse Practitioner

## 2019-07-30 ENCOUNTER — Other Ambulatory Visit: Payer: Self-pay

## 2019-07-30 DIAGNOSIS — Z5111 Encounter for antineoplastic chemotherapy: Secondary | ICD-10-CM | POA: Diagnosis present

## 2019-07-30 DIAGNOSIS — M542 Cervicalgia: Secondary | ICD-10-CM | POA: Insufficient documentation

## 2019-07-30 DIAGNOSIS — C3492 Malignant neoplasm of unspecified part of left bronchus or lung: Secondary | ICD-10-CM | POA: Insufficient documentation

## 2019-07-30 DIAGNOSIS — C772 Secondary and unspecified malignant neoplasm of intra-abdominal lymph nodes: Secondary | ICD-10-CM | POA: Insufficient documentation

## 2019-07-30 DIAGNOSIS — E785 Hyperlipidemia, unspecified: Secondary | ICD-10-CM | POA: Diagnosis not present

## 2019-07-30 DIAGNOSIS — C3491 Malignant neoplasm of unspecified part of right bronchus or lung: Secondary | ICD-10-CM | POA: Diagnosis not present

## 2019-07-30 DIAGNOSIS — R5383 Other fatigue: Secondary | ICD-10-CM | POA: Insufficient documentation

## 2019-07-30 DIAGNOSIS — N400 Enlarged prostate without lower urinary tract symptoms: Secondary | ICD-10-CM | POA: Diagnosis not present

## 2019-07-30 DIAGNOSIS — C787 Secondary malignant neoplasm of liver and intrahepatic bile duct: Secondary | ICD-10-CM | POA: Insufficient documentation

## 2019-07-30 DIAGNOSIS — K219 Gastro-esophageal reflux disease without esophagitis: Secondary | ICD-10-CM | POA: Diagnosis not present

## 2019-07-30 DIAGNOSIS — Z23 Encounter for immunization: Secondary | ICD-10-CM | POA: Diagnosis not present

## 2019-07-30 DIAGNOSIS — F17210 Nicotine dependence, cigarettes, uncomplicated: Secondary | ICD-10-CM | POA: Insufficient documentation

## 2019-07-30 DIAGNOSIS — Z7982 Long term (current) use of aspirin: Secondary | ICD-10-CM | POA: Diagnosis not present

## 2019-07-30 DIAGNOSIS — Z5112 Encounter for antineoplastic immunotherapy: Secondary | ICD-10-CM | POA: Insufficient documentation

## 2019-07-30 DIAGNOSIS — I10 Essential (primary) hypertension: Secondary | ICD-10-CM | POA: Insufficient documentation

## 2019-07-30 DIAGNOSIS — Z5189 Encounter for other specified aftercare: Secondary | ICD-10-CM | POA: Diagnosis not present

## 2019-07-30 DIAGNOSIS — C3490 Malignant neoplasm of unspecified part of unspecified bronchus or lung: Secondary | ICD-10-CM

## 2019-07-30 DIAGNOSIS — J439 Emphysema, unspecified: Secondary | ICD-10-CM | POA: Insufficient documentation

## 2019-07-30 DIAGNOSIS — C7951 Secondary malignant neoplasm of bone: Secondary | ICD-10-CM | POA: Insufficient documentation

## 2019-07-30 DIAGNOSIS — Z79899 Other long term (current) drug therapy: Secondary | ICD-10-CM | POA: Diagnosis not present

## 2019-07-30 DIAGNOSIS — C7801 Secondary malignant neoplasm of right lung: Secondary | ICD-10-CM | POA: Diagnosis not present

## 2019-07-30 LAB — CMP (CANCER CENTER ONLY)
ALT: 12 U/L (ref 0–44)
AST: 13 U/L — ABNORMAL LOW (ref 15–41)
Albumin: 3.7 g/dL (ref 3.5–5.0)
Alkaline Phosphatase: 241 U/L — ABNORMAL HIGH (ref 38–126)
Anion gap: 11 (ref 5–15)
BUN: 13 mg/dL (ref 8–23)
CO2: 23 mmol/L (ref 22–32)
Calcium: 9.3 mg/dL (ref 8.9–10.3)
Chloride: 108 mmol/L (ref 98–111)
Creatinine: 1.07 mg/dL (ref 0.61–1.24)
GFR, Est AFR Am: >60 mL/min (ref >60)
GFR, Estimated: >60 mL/min (ref >60)
Glucose, Bld: 125 mg/dL — ABNORMAL HIGH (ref 70–99)
Potassium: 4.8 mmol/L (ref 3.5–5.1)
Sodium: 142 mmol/L (ref 135–145)
Total Bilirubin: 0.4 mg/dL (ref 0.3–1.2)
Total Protein: 7.4 g/dL (ref 6.5–8.1)

## 2019-07-30 LAB — CBC WITH DIFFERENTIAL (CANCER CENTER ONLY)
Abs Immature Granulocytes: 0.43 10*3/uL — ABNORMAL HIGH (ref 0.00–0.07)
Basophils Absolute: 0.1 10*3/uL (ref 0.0–0.1)
Basophils Relative: 0 %
Eosinophils Absolute: 0.1 10*3/uL (ref 0.0–0.5)
Eosinophils Relative: 0 %
HCT: 29 % — ABNORMAL LOW (ref 39.0–52.0)
Hemoglobin: 9.7 g/dL — ABNORMAL LOW (ref 13.0–17.0)
Immature Granulocytes: 1 %
Lymphocytes Relative: 6 %
Lymphs Abs: 2.2 10*3/uL (ref 0.7–4.0)
MCH: 32.9 pg (ref 26.0–34.0)
MCHC: 33.4 g/dL (ref 30.0–36.0)
MCV: 98.3 fL (ref 80.0–100.0)
Monocytes Absolute: 0.5 10*3/uL (ref 0.1–1.0)
Monocytes Relative: 2 %
Neutro Abs: 32.5 10*3/uL — ABNORMAL HIGH (ref 1.7–7.7)
Neutrophils Relative %: 91 %
Platelet Count: 231 10*3/uL (ref 150–400)
RBC: 2.95 MIL/uL — ABNORMAL LOW (ref 4.22–5.81)
RDW: 18.3 % — ABNORMAL HIGH (ref 11.5–15.5)
WBC Count: 35.8 10*3/uL — ABNORMAL HIGH (ref 4.0–10.5)
nRBC: 0 % (ref 0.0–0.2)

## 2019-07-30 MED ORDER — IOHEXOL 300 MG/ML  SOLN
100.0000 mL | Freq: Once | INTRAMUSCULAR | Status: AC | PRN
  Administered 2019-07-30: 09:00:00 100 mL via INTRAVENOUS

## 2019-07-30 MED ORDER — SODIUM CHLORIDE (PF) 0.9 % IJ SOLN
INTRAMUSCULAR | Status: AC
  Filled 2019-07-30: qty 50

## 2019-07-31 ENCOUNTER — Ambulatory Visit (HOSPITAL_COMMUNITY)
Admission: RE | Admit: 2019-07-31 | Discharge: 2019-07-31 | Disposition: A | Discharge status: Home / Self Care | Payer: Medicare PPO | Source: Ambulatory Visit | Attending: Nurse Practitioner | Admitting: Nurse Practitioner

## 2019-07-31 DIAGNOSIS — C3490 Malignant neoplasm of unspecified part of unspecified bronchus or lung: Secondary | ICD-10-CM | POA: Diagnosis not present

## 2019-07-31 MED ORDER — GADOBUTROL 1 MMOL/ML IV SOLN
7.0000 mL | Freq: Once | INTRAVENOUS | Status: AC | PRN
  Administered 2019-07-31: 7 mL via INTRAVENOUS

## 2019-07-31 NOTE — Progress Notes (Signed)
Histology and Location of Primary Cancer: Metastatic small cell lung cancer right lung to liver, bone, left lung, LN's  Staging form: Lung, AJCC 8th Edition - Clinical: Stage IVB (cTX, cN2, pM1c) - Signed by Truitt Merle, MD on 06/06/2019   MRI C Spine 07/31/2019: Multiple osseous metastatic disease within the cervical and visualized upper thoracic spine.  The largest lesion is present within the C2 vertebral body/dens, measuring 2.1 x 1.2 cm.  CT CAP 07/30/2019: Marked interval decrease in size of the hypermetabolic bilateral pulmonary nodules seen on previous PET.  Clear interval decrease in the numerous hepatic metastases.  Interval resolution of mediastinal and upper abdominal lymphadenopathy seen previously.  Stable left adrenal nodule.  No new or progressive findings to suggest disease progression.  Stable hypo-enhancing splenic lesion.  MRI Brain 06/14/2019: No brain metastases.  Probably metastatic lesions within the upper cervical spine as seen.  No sign of spinal canal encroachment.  PET 05/28/2019: Examination is positive for widespread FDG avid liver metastases.  Liver lesions are too numerous to count.  Hypermetabolic mediastinal and upper abdominal nodal metastases.  Left upper lobe and right upper lobe hypermetabolic pulmonary nodules consistent with pulmonary metastasis.  Innumerable liver metastases noted within the axial and proximal appendicular skeleton.  Bone lesions are too numerous to count.  Some of these have a corresponding lytic lesion on CT images.  CT AP 05/01/2019: New diffuse liver metastases.  New mild retroperitoneal lymphadenopathy, consistent with metastatic disease.  Resolution of previously seen pancreatic mass since previous study.  Stable left adrenal mass, consistent with adrenal adenoma.  Location(s) of Symptomatic Metastases: C5 vertebra  Past/Anticipated chemotherapy by medical oncology, if any:  NP [facility]/[name] 07/23/2019 -Goal of treatment is  palliative. -Chemotherapy: Carboplatin Day 1 and Etopside day 1-3 with immunotherapy atezolizumab (Tecentriq) on day 1, every 3 weeks for 6 cycles starting 06/10/2019.  Plan to continue maintenance Tecentriq afterward. -Plan per [name]: restage after cycle 3 to monitor response.  If he has a good response or stable disease, will continue for a total of 6 cycles then switch to Atezolizumab maintenance therapy after cycle 6. -He has worsening left neck stiffness and pain.  Neck pain is possibly related to the lytic lesion within C5 vertebra noted on PET. -Will include MRI C spine with restaging scans.  Will also discuss with rad onc. -He will return for f/u and review of imaging in 3 weeks.  If scan shows good response or is stable, plan to proceed with cycle 4 at next visit.  Pain on a scale of 0-10 is: Neck pain and stiffness improved, he is taking tramadol.   Ambulatory status? Walker? Wheelchair?: Ambulatory  SAFETY ISSUES:  Prior radiation? No  Pacemaker/ICD? No  Possible current pregnancy? n/a  Is the patient on methotrexate? No  Current Complaints / other details:

## 2019-08-01 ENCOUNTER — Ambulatory Visit
Admission: RE | Admit: 2019-08-01 | Discharge: 2019-08-01 | Disposition: A | Discharge status: Home / Self Care | Payer: Medicare PPO | Source: Ambulatory Visit | Attending: Radiation Oncology | Admitting: Radiation Oncology

## 2019-08-01 ENCOUNTER — Telehealth: Payer: Self-pay | Admitting: Nurse Practitioner

## 2019-08-01 ENCOUNTER — Encounter: Payer: Self-pay | Admitting: Radiation Oncology

## 2019-08-01 ENCOUNTER — Other Ambulatory Visit: Payer: Self-pay

## 2019-08-01 VITALS — Ht 71.0 in | Wt 168.0 lb

## 2019-08-01 DIAGNOSIS — C7951 Secondary malignant neoplasm of bone: Secondary | ICD-10-CM

## 2019-08-01 DIAGNOSIS — C3490 Malignant neoplasm of unspecified part of unspecified bronchus or lung: Secondary | ICD-10-CM

## 2019-08-01 NOTE — Telephone Encounter (Signed)
I attempted to reach patient to review CT and cervical MRI results, no answer on home or mobile numbers. Left message to call back. [name], NP  08/01/2019

## 2019-08-01 NOTE — Progress Notes (Signed)
Radiation Oncology         [phone] ________________________________  Initial Outpatient Consultation - Conducted via telephone due to current COVID-19 concerns for limiting patient exposure  I spoke with the patient to conduct this consult visit via telephone to spare the patient unnecessary potential exposure in the healthcare setting during the current COVID-19 pandemic. The patient was notified in advance and was offered a [place] meeting to allow for face to face communication but unfortunately reported that they did not have the appropriate resources/technology to support such a visit and instead preferred to proceed with a telephone consult.   ________________________________  Name: Jeremy Mack        MRN: [id number]  Date of Service: 08/01/2019 DOB: [birth year]  JJ:HERDEY, Jeremy Repress, FNP  Alla Feeling, NP     REFERRING PHYSICIAN: Alla Feeling, NP   DIAGNOSIS: The primary encounter diagnosis was Metastasis to bone [facility]). A diagnosis of Small cell lung cancer ([place]) was also pertinent to this visit.   HISTORY OF PRESENT ILLNESS: [name] is a 73 y.o. male seen at the request of [name] for a recently diagnosed  extensive stage lung cancer diagnosed in July 2020 involving the lungs, liver, mediastinal and upper abdominal adenopathy. He had negative imaging of the brain, and began carboplatin/etoposide/tecentriq which he begame on 07/01/2019. He had recent scan on 07/30/2019 that showed improvement in his systemic disease. He had complained of neck pain at his last visit with [name], NP, and given his imaging showing a C5 lesion, an MRI of the c spine was ordered. He was also given pain medication. His MRI yesterday revealed multiple lesions in the c spine specifically there was a 2.1 x 1.2 cm lesion at C2, and a lobular appearing lesion at C6-C7 revealed mild spinal stenosis and hyperintense lobular focus in the right aspect of the spinal canal measuring 1.1 x .6 cm. This was  a possible perineural cyst versus disc extension. There was effacement of the right anterolateral thecal sac at the right foraminal entry zone. He is contacted today to discuss treatment options with radiotherapy.   PREVIOUS RADIATION THERAPY: No   PAST MEDICAL HISTORY:  Past Medical History:  Diagnosis Date   Cancer ([place])    GERD (gastroesophageal reflux disease)    Hyperlipemia    Hypertension        PAST SURGICAL HISTORY: Past Surgical History:  Procedure Laterality Date   arm fracture surgery Right    colon polyps removed     ESOPHAGOGASTRODUODENOSCOPY (EGD) WITH PROPOFOL N/A 06/14/2018   Procedure: ESOPHAGOGASTRODUODENOSCOPY (EGD) WITH PROPOFOL;  Surgeon: [name], MD;  Location: [facility];  Service: Endoscopy;  Laterality: N/A;   EUS N/A 06/14/2018   Procedure: ESOPHAGEAL ENDOSCOPIC ULTRASOUND (EUS) RADIAL;  Surgeon: [name], MD;  Location: [facility];  Service: Endoscopy;  Laterality: N/A;   FINE NEEDLE ASPIRATION N/A 06/14/2018   Procedure: FINE NEEDLE ASPIRATION (FNA) LINEAR;  Surgeon: [name], MD;  Location: [facility];  Service: Endoscopy;  Laterality: N/A;   TONSILLECTOMY       FAMILY HISTORY:  Family History  Problem Relation Age of Onset   Diabetes Mother    Diabetes Maternal Grandmother      SOCIAL HISTORY:  reports that he has been smoking cigars. He has been smoking about 0.50 packs per day. He has never used smokeless tobacco. He reports current alcohol use. He reports that he does not use drugs. The patient is married and lives  in [place]. He is retired from working in [employer], educational and enjoys gardening in retirement.   ALLERGIES: Patient has no known allergies.   MEDICATIONS:  Current Outpatient Medications  Medication Sig Dispense Refill   allopurinol (ZYLOPRIM) 300 MG tablet TAKE 1 TABLET BY MOUTH TWICE A DAY 60 tablet 0   amLODipine (NORVASC) 10 MG tablet Take 1 tablet (10 mg total) by mouth daily.  90 tablet 0   aspirin-sod bicarb-citric acid (ALKA-SELTZER) 325 MG TBEF tablet Take 650 mg by mouth every 6 (six) hours as needed.     nicotine (NICODERM CQ - DOSED IN MG/24 HOURS) 14 mg/24hr patch Place 1 patch (14 mg total) onto the skin daily. 28 patch 0   Omega-3 Fatty Acids (FISH OIL) 1000 MG CAPS Take 1 capsule by mouth daily.     ondansetron (ZOFRAN) 8 MG tablet Take 1 tablet (8 mg total) by mouth 2 (two) times daily as needed for refractory nausea / vomiting. Start on day 3 after carboplatin chemo. 30 tablet 1   pantoprazole (PROTONIX) 20 MG tablet TAKE 1 TABLET BY MOUTH EVERY DAY 30 tablet 0   prochlorperazine (COMPAZINE) 10 MG tablet Take 1 tablet (10 mg total) by mouth every 6 (six) hours as needed (Nausea or vomiting). 30 tablet 1   traMADol (ULTRAM) 50 MG tablet Take 1 tablet (50 mg total) by mouth every 12 (twelve) hours as needed. 30 tablet 0   No current facility-administered medications for this encounter.      REVIEW OF SYSTEMS: On review of systems, the patient reports that he is doing well overall. He reports his neck pain has improved and he continues to take pain medication with good results. He denies any stiffness, numbness in his upper torso or extremities. He denies any chest pain, shortness of breath, cough, fevers, chills, night sweats, unintended weight changes. He denies any bowel or bladder disturbances, and denies abdominal pain, nausea or vomiting. He denies any new musculoskeletal or joint aches or pains. A complete review of systems is obtained and is otherwise negative.     PHYSICAL EXAM:  Wt Readings from Last 3 Encounters:  08/01/19 168 lb (76.2 kg)  07/23/19 169 lb 4.8 oz (76.8 kg)  07/01/19 166 lb (75.3 kg)   Unable to assess due to encounter type.    ECOG = 0  0 - Asymptomatic (Fully active, able to carry on all predisease activities without restriction)  1 - Symptomatic but completely ambulatory (Restricted in physically strenuous  activity but ambulatory and able to carry out work of a light or sedentary nature. For example, light housework, office work)  2 - Symptomatic, <50% in bed during the day (Ambulatory and capable of all self care but unable to carry out any work activities. Up and about more than 50% of waking hours)  3 - Symptomatic, >50% in bed, but not bedbound (Capable of only limited self-care, confined to bed or chair 50% or more of waking hours)  4 - Bedbound (Completely disabled. Cannot carry on any self-care. Totally confined to bed or chair)  5 - Death   Eustace Pen MM, Creech RH, Tormey DC, et al. [phone]). "Toxicity and response criteria of the [facility]". [place] Oncol. 5 (6): 649-55    LABORATORY DATA:  Lab Results  Component Value Date   WBC 35.8 (H) 07/30/2019   HGB 9.7 (L) 07/30/2019   HCT 29.0 (L) 07/30/2019   MCV 98.3 07/30/2019   PLT 231 07/30/2019   Lab  Results  Component Value Date   NA 142 07/30/2019   K 4.8 07/30/2019   CL 108 07/30/2019   CO2 23 07/30/2019   Lab Results  Component Value Date   ALT 12 07/30/2019   AST 13 (L) 07/30/2019   ALKPHOS 241 (H) 07/30/2019   BILITOT 0.4 07/30/2019      RADIOGRAPHY: Ct Chest W Contrast  Result Date: 07/30/2019 CLINICAL DATA:  Small-cell lung cancer.  Restaging. EXAM: CT CHEST, ABDOMEN, AND PELVIS WITH CONTRAST TECHNIQUE: Multidetector CT imaging of the chest, abdomen and pelvis was performed following the standard protocol during bolus administration of intravenous contrast. CONTRAST:  139m OMNIPAQUE IOHEXOL 300 MG/ML  SOLN COMPARISON:  PET-CT 05/28/2019 abdomen and pelvis CT 05/01/2019 FINDINGS: CT CHEST FINDINGS Cardiovascular: The heart size is normal. No substantial pericardial effusion. No thoracic aortic aneurysm. Mediastinum/Nodes: No mediastinal lymphadenopathy. There is no hilar lymphadenopathy. The esophagus has normal imaging features. There is no axillary lymphadenopathy. Lungs/Pleura: Marked  interval decrease in the hypermetabolic pulmonary nodule seen on previous PET-CT. 1.1 x 0.5 cm medial left upper lobe nodule (49/6) has decreased from 2.6 x 1.7 cm when I remeasure in a similar fashion on the prior study. 0.7 x 0.4 cm right upper lobe nodule on today's exam was 1.0 x 0.8 cm previously. No new pulmonary nodule or mass. No focal airspace consolidation. No pleural effusion. Musculoskeletal: The areas of heterogeneous mineralization in the thoracic spine, corresponding to hypermetabolic disease seen on previous PET-CT are similar in the interval. Sclerotic changes in the sternum with fusion of the bilateral first ribs to the sternum, similar to prior. CT ABDOMEN PELVIS FINDINGS Hepatobiliary: Numerous low-density liver lesions are identified consistent with metastatic disease. These are decreased in the interval. Liver lesions have become better defined and lower in attenuation in the interval. 2.1 cm lesion lateral segment left liver (65/2) was 3.9 cm when I remeasure in a similar fashion previously. 1.2 cm lesion in the central right liver (67/2) was 2.6 cm when remeasured previously. 1.6 cm lesion in the dome of the right liver (54/2) was 2.4 cm previously. Layering tiny gallstones evident. No intrahepatic or extrahepatic biliary dilation. Pancreas: No focal mass lesion. No dilatation of the main duct. No intraparenchymal cyst. No peripancreatic edema. Spleen: 2.1 cm low-density lesion in the spleen is similar to prior PET-CT and chest CT of 05/01/2019. Adrenals/Urinary Tract: Right adrenal gland unremarkable. Stable 2.1 cm left adrenal nodule. Cystic lesions are noted in the kidneys bilaterally, similar. No evidence for hydroureter. The urinary bladder appears normal for the degree of distention. Stomach/Bowel: Stomach is unremarkable. No gastric wall thickening. No evidence of outlet obstruction. Duodenum is normally positioned as is the ligament of Treitz. No small bowel wall thickening. No small  bowel dilatation. The terminal ileum is normal. The appendix is not visualized, but there is no edema or inflammation in the region of the cecum. Left colonic anastomosis evident. Vascular/Lymphatic: There is abdominal aortic atherosclerosis without aneurysm. 1.2 cm hypermetabolic portal caval lymph node measured on prior PET-CT is now 0.6 cm short axis (70/2). Hypermetabolic aortocaval lymph nodes are identified on the previous PET-CT. Index 1.2 cm lesion measured previously is now 0.4 cm (68/2). No new abdominal lymphadenopathy. No pelvic lymphadenopathy. Reproductive: Prostate gland is enlarged. Other: No intraperitoneal free fluid. Musculoskeletal: Areas of heterogeneous bone mineralization again noted in the lumbar spine and pelvis compatible with metastatic involvement. Appearance is overall similar to prior PET-CT. IMPRESSION: 1. Marked interval decrease in size of the hypermetabolic bilateral pulmonary  nodules seen on previous PET-CT. 2. Clear interval decrease in the numerous hepatic metastases. 3. Interval resolution of mediastinal and upper abdominal lymphadenopathy seen previously. 4. Stable left adrenal nodule. This shows low level FDG uptake on prior PET-CT and is indeterminate. Continued attention on follow-up recommended. 5. No new or progressive findings to suggest disease progression. 6. Stable hypoenhancing splenic lesion. Attention on follow-up recommended. 7. Prostatomegaly. 8.  Aortic Atherosclerois (ICD10-170.0) Electronically Signed   By: Misty Stanley M.D.   On: 07/30/2019 10:25   Mr Cervical Spine W Wo Contrast  Result Date: 07/31/2019 CLINICAL DATA:  Lung cancer, small cell, staging worsening neck pain, C5 met on PET scan EXAM: MRI CERVICAL SPINE WITHOUT AND WITH CONTRAST TECHNIQUE: Multiplanar and multiecho pulse sequences of the cervical spine, to include the craniocervical junction and cervicothoracic junction, were obtained without and with intravenous contrast. CONTRAST:  7 mL  [facility] COMPARISON:  Brain MRI 06/14/2019, nuclear medicine PET scan 05/28/2019 FINDINGS: Multiple sequences are significantly motion degraded, limiting evaluation. Alignment: Straightening of the expected cervical lordosis. No significant spondylolisthesis. Vertebrae: There are numerous metastatic lesions within the cervical and visualized upper thoracic vertebral bodies and posterior elements. These lesions are characterized by abnormal T1 hypointensity and T2/STIR hyperintensity as well as enhancement. The largest lesion is present within the C2 vertebral body and dens, measuring 2.1 x 1.2 cm (series 3, image 6). Cord: Within the limitations of significantly motion degraded imaging, no definite spinal cord signal abnormality or cord enhancement is identified. Posterior Fossa, vertebral arteries, paraspinal tissues: Imaged posterior fossa is unremarkable. Within the limitations of motion degraded imaging, the imaged paraspinal soft tissues are unremarkable. Preserved flow voids within the visualized portions of the cervical vertebral arteries. Disc levels: Mild multilevel disc degeneration. C2-C3: Left-sided disc osteophyte ridge/uncinate hypertrophy with left-sided facet hypertrophy. No significant spinal canal stenosis. Moderate left neural foraminal narrowing. C3-C4: Minimal disc bulge. Mild uncinate/facet hypertrophy. No significant spinal canal stenosis. Mild bilateral neural foraminal narrowing (greater on the right). C4-C5: Small disc bulge with bilateral disc osteophyte ridge/uncinate hypertrophy. Facet hypertrophy. Mild spinal canal stenosis. Mild bilateral neural foraminal narrowing. C5-C6: Minimal disc bulge. No significant spinal canal stenosis or neural foraminal narrowing. C6-C7: Evaluation at this level is significantly limited due to motion degradation. There is a disc bulge with uncinate/facet hypertrophy. Mild spinal canal stenosis at the C6-C7 disc level. At the C6 vertebral body level, there  is a 1.1 x 0.6 cm nonenhancing T2 hyperintense lobular focus within the right aspect of the spinal canal. This finding is indeterminate but may reflect a perineural cyst. Alternatively, this may reflect a cranially migrated disc extrusion/discal cyst extending from the C6-C7 disc. Resultant effacement of the right anterolateral thecal sac and right foraminal entry zone. Resultant mild spinal canal stenosis at this level with contact upon the right aspect of the spinal cord. Mild right with mild/moderate left neural foraminal narrowing. C7-T1: Small disc bulge. No significant spinal canal or neural foraminal narrowing. IMPRESSION: Significantly motion degraded and limited examination as described. Multifocal osseous metastatic disease within the cervical and visualized upper thoracic spine. The largest lesion is present within the C2 vertebral body/dens, measuring 2.1 x 1.2 cm. Cervical spondylosis as described. At the C6 vertebral body level, there is a 1.1 cm T2 hyperintense lobular focus within the right aspect of the spinal canal. This finding is indeterminate but may reflect a perineural cyst. Alternatively, this may reflect a cranially migrated disc extrusion/discal cyst arising from the C6-C7 disc. Resultant effacement of the right  anterolateral thecal sac and right foraminal entry zone. Resultant mild spinal canal stenosis at this level with contact upon the right aspect of the spinal cord. No more than mild spinal canal stenosis at the remaining levels. Multilevel neural foraminal narrowing greatest on the left at C2-C3 (moderate) and on the left at C6-C7 (mild/moderate). Electronically Signed   By: Kellie Simmering   On: 07/31/2019 12:32   Ct Abdomen Pelvis W Contrast  Result Date: 07/30/2019 CLINICAL DATA:  Small-cell lung cancer.  Restaging. EXAM: CT CHEST, ABDOMEN, AND PELVIS WITH CONTRAST TECHNIQUE: Multidetector CT imaging of the chest, abdomen and pelvis was performed following the standard protocol  during bolus administration of intravenous contrast. CONTRAST:  125m OMNIPAQUE IOHEXOL 300 MG/ML  SOLN COMPARISON:  PET-CT 05/28/2019 abdomen and pelvis CT 05/01/2019 FINDINGS: CT CHEST FINDINGS Cardiovascular: The heart size is normal. No substantial pericardial effusion. No thoracic aortic aneurysm. Mediastinum/Nodes: No mediastinal lymphadenopathy. There is no hilar lymphadenopathy. The esophagus has normal imaging features. There is no axillary lymphadenopathy. Lungs/Pleura: Marked interval decrease in the hypermetabolic pulmonary nodule seen on previous PET-CT. 1.1 x 0.5 cm medial left upper lobe nodule (49/6) has decreased from 2.6 x 1.7 cm when I remeasure in a similar fashion on the prior study. 0.7 x 0.4 cm right upper lobe nodule on today's exam was 1.0 x 0.8 cm previously. No new pulmonary nodule or mass. No focal airspace consolidation. No pleural effusion. Musculoskeletal: The areas of heterogeneous mineralization in the thoracic spine, corresponding to hypermetabolic disease seen on previous PET-CT are similar in the interval. Sclerotic changes in the sternum with fusion of the bilateral first ribs to the sternum, similar to prior. CT ABDOMEN PELVIS FINDINGS Hepatobiliary: Numerous low-density liver lesions are identified consistent with metastatic disease. These are decreased in the interval. Liver lesions have become better defined and lower in attenuation in the interval. 2.1 cm lesion lateral segment left liver (65/2) was 3.9 cm when I remeasure in a similar fashion previously. 1.2 cm lesion in the central right liver (67/2) was 2.6 cm when remeasured previously. 1.6 cm lesion in the dome of the right liver (54/2) was 2.4 cm previously. Layering tiny gallstones evident. No intrahepatic or extrahepatic biliary dilation. Pancreas: No focal mass lesion. No dilatation of the main duct. No intraparenchymal cyst. No peripancreatic edema. Spleen: 2.1 cm low-density lesion in the spleen is similar to  prior PET-CT and chest CT of 05/01/2019. Adrenals/Urinary Tract: Right adrenal gland unremarkable. Stable 2.1 cm left adrenal nodule. Cystic lesions are noted in the kidneys bilaterally, similar. No evidence for hydroureter. The urinary bladder appears normal for the degree of distention. Stomach/Bowel: Stomach is unremarkable. No gastric wall thickening. No evidence of outlet obstruction. Duodenum is normally positioned as is the ligament of Treitz. No small bowel wall thickening. No small bowel dilatation. The terminal ileum is normal. The appendix is not visualized, but there is no edema or inflammation in the region of the cecum. Left colonic anastomosis evident. Vascular/Lymphatic: There is abdominal aortic atherosclerosis without aneurysm. 1.2 cm hypermetabolic portal caval lymph node measured on prior PET-CT is now 0.6 cm short axis (70/2). Hypermetabolic aortocaval lymph nodes are identified on the previous PET-CT. Index 1.2 cm lesion measured previously is now 0.4 cm (68/2). No new abdominal lymphadenopathy. No pelvic lymphadenopathy. Reproductive: Prostate gland is enlarged. Other: No intraperitoneal free fluid. Musculoskeletal: Areas of heterogeneous bone mineralization again noted in the lumbar spine and pelvis compatible with metastatic involvement. Appearance is overall similar to prior PET-CT. IMPRESSION: 1.  Marked interval decrease in size of the hypermetabolic bilateral pulmonary nodules seen on previous PET-CT. 2. Clear interval decrease in the numerous hepatic metastases. 3. Interval resolution of mediastinal and upper abdominal lymphadenopathy seen previously. 4. Stable left adrenal nodule. This shows low level FDG uptake on prior PET-CT and is indeterminate. Continued attention on follow-up recommended. 5. No new or progressive findings to suggest disease progression. 6. Stable hypoenhancing splenic lesion. Attention on follow-up recommended. 7. Prostatomegaly. 8.  Aortic Atherosclerois  (ICD10-170.0) Electronically Signed   By: Misty Stanley M.D.   On: 07/30/2019 10:25       IMPRESSION/PLAN: 1. Extensive stage small cell carcinoma of the lung with lung, liver, bone, and lymph node involvement. Dr. Lisbeth Renshaw reviews the findings from the patient's imaging and he discusses that while the patient's symptoms have improved with pain medication, and his quality of life is stable, we could consider proceeding with following this area. The location of the site however could be problematic, and Dr. Lisbeth Renshaw offers the option to treat or follow his symptoms, and if he's able to discontinue pain medication, then proceeding would be recommended. After considering his options, the patient would like to hold off on radiation to see if his pain persists after discontinuing pain medication.  We discussed the risks, benefits, short, and long term effects of radiotherapy. Dr. Lisbeth Renshaw discusses the delivery and logistics of radiotherapy and anticipates a course of 3 weeks of radiotherapy given ongoing systemic therapy. We will follow along with him in the next cycle or so of treatment and if he wishes to proceed, we'd move forward next with simulation. He is in agreement with this plan.   Given current concerns for patient exposure during the COVID-19 pandemic, this encounter was conducted via telephone.  The patient has given verbal consent for this type of encounter. The time spent during this encounter was 45 minutes and 50% of that time was spent in the coordination of his care. The attendants for this meeting include Dr. Lisbeth Renshaw, Blenda Nicely, RN, Shona Simpson, [facility] and Jermon Chalfant and his wife [name]. During the encounter, Dr. Lisbeth Renshaw, Blenda Nicely, RN, Shona Simpson [facility] were located at [facility] Department.  Ronnel Zuercher and his wife  were located at home.   The above documentation reflects my direct findings during this shared patient visit. Please see the separate note by  Dr. Lisbeth Renshaw on this date for the remainder of the patient's plan of care.    Carola Rhine, PAC

## 2019-08-08 ENCOUNTER — Other Ambulatory Visit: Payer: Self-pay | Admitting: Nurse Practitioner

## 2019-08-08 MED ORDER — PANTOPRAZOLE SODIUM 20 MG PO TBEC: 20.0000 mg | DELAYED_RELEASE_TABLET | Freq: Every day | ORAL | 2 refills | Status: DC

## 2019-08-08 NOTE — Progress Notes (Signed)
[facility]   [phone] [phone]   Clinic Follow up Note   Patient Care Team: [name], FNP as PCP - General (Internal Medicine) Arna Snipe, RN as Oncology Nurse Navigator  Date of Service:  08/12/2019  CHIEF COMPLAINT: Metastatic Small Cell Lung Cancer  SUMMARY OF ONCOLOGIC HISTORY: Oncology History Overview Note  Cancer Staging Small cell lung cancer ([place]) Staging form: Lung, AJCC 8th Edition - Clinical: Stage IVB (cTX, cN2, pM1c) - Signed by Truitt Merle, MD on 06/06/2019    Small cell lung cancer ([place])  07/11/2018 Imaging   CT AP 07/11/18 IMPRESSION: 1. Significantly improved appearance of the pancreas and peripancreatic tissues. Improved to resolved peripancreatic edema with resolved and significantly improved peripancreatic lesions. The remaining lesion was felt to represent abscess on prior endoscopic ultrasound sampling. Pseudocyst or infected pseudocyst would be the favored imaging diagnosis. 2. Persistent but improved mass effect upon the inferior aspect of the portal vein and superior aspect of the superior mesenteric vein. 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 3. Aortic Atherosclerosis (ICD10-I70.0). Bilateral common iliac artery ectasia. 4. A left adrenal nodule is indeterminate on precontrast imaging and may have decreased in size since the prior. Consider dedicated adrenal protocol CT. This could either be performed in 6 months to confirm size stability or more acutely, depending on clinical concern. 5. A splenic lesion is indeterminate and can be re-evaluated on follow-up. 6. Hepatic morphology suspicious for cirrhosis. 7. Cholelithiasis. 8.  Emphysema (ICD10-J43.9).   05/01/2019 Imaging   CT AP 05/01/19  IMPRESSION: 1. New diffuse liver metastases. 2. New mild retroperitoneal lymphadenopathy, consistent with metastatic disease. 3. Resolution of previously seen pancreatic  mass since previous study. No radiographic evidence of acute pancreatitis. 4. Stable left adrenal mass, consistent with adrenal adenoma. 5. Stable markedly enlarged prostate and findings of chronic bladder outlet obstruction. 6. Colonic diverticulosis. No radiographic evidence of diverticulitis. 7. Cholelithiasis.   Aortic Atherosclerosis (ICD10-I70.0).   05/28/2019 PET scan   PET 05/28/19  IMPRESSION: 1. Examination is positive for widespread FDG avid liver metastases. Liver lesions are too numerous to count. 2. Hypermetabolic mediastinal and upper abdominal nodal metastases. 3. Left upper lobe and right upper lobe hypermetabolic pulmonary nodules consistent with pulmonary metastasis. 4. Innumerable liver metastases noted within the axial and proximal appendicular skeleton. Bone lesions are too numerous to count. Some of these have a corresponding lytic lesion on CT images. Others appear occult on the corresponding CT images. 5. Solid hyperdense left adrenal nodule is not significantly changed from 05/01/2019 and exhibits mild FDG uptake. Indeterminate. 6. Prostate gland enlargement. Focal area of increased uptake within the left posterior gland is indeterminate.   06/04/2019 Initial Biopsy   Biopsy 06/04/19  Diagnosis Liver, needle/core biopsy - SMALL CELL CARCINOMA. SEE COMMENT.   06/06/2019 Initial Diagnosis   Small cell lung cancer ([place])   06/06/2019 Cancer Staging   Staging form: Lung, AJCC 8th Edition - Clinical: Stage IVB (cTX, cN2, pM1c) - Signed by Truitt Merle, MD on 06/06/2019   06/10/2019 -  Chemotherapy   Carboplatin Day 1 and Etopside day 1-3 with immunotherapy atezolizumab (Tecentriq) on day 1, every 3 weeks for 6 cycles starting 06/10/19. Plan to continue maintenance Tecentriq afterward.    06/14/2019 Imaging    MRI brain 06/14/19  IMPRESSION: No brain metastases. Chronic small-vessel ischemic changes of the pons and cerebral hemispheric white matter.   Probable  metastatic lesions within the upper cervical spine as seen. No sign of spinal canal  encroachment.   07/30/2019 Imaging   CT CAP W Contrast  IMPRESSION: 1. Marked interval decrease in size of the hypermetabolic bilateral pulmonary nodules seen on previous PET-CT. 2. Clear interval decrease in the numerous hepatic metastases. 3. Interval resolution of mediastinal and upper abdominal lymphadenopathy seen previously. 4. Stable left adrenal nodule. This shows low level FDG uptake on prior PET-CT and is indeterminate. Continued attention on follow-up recommended. 5. No new or progressive findings to suggest disease progression. 6. Stable hypoenhancing splenic lesion. Attention on follow-up recommended. 7. Prostatomegaly. 8.  Aortic Atherosclerois (ICD10-170.0)        CURRENT THERAPY:  First lineCarboplatin Day 1 and Etopside day 1-3 with immunotherapyatezolizumab(Tecentriq) on day 1, every 3 weeks for 6 cyclesstarting 06/10/19.  INTERVAL HISTORY:  Jeremy Mack is here for a follow up and treatment. He presents to the clinic alone. He started having neck pain 2 weeks ago. He saw Dr. Lisbeth Renshaw for possible radiation in his neck. He feels the icy hot improved his pain and now resolved. He feels this was muscular related. He notes he has been able to tolerate chemo mostly well with fatigue. He has to take a break with activities as needed. I reviewed his medication list with him. He does not feels he needs a port at this time. His peripheral veins are still manageable.  He notes he is to appear in court but has been postponed due to chemo. He feels he will be able to attend after chemo. He requested to post pone his court date for now.    REVIEW OF SYSTEMS:   Constitutional: Denies fevers, chills or abnormal weight loss Eyes: Denies blurriness of vision Ears, nose, mouth, throat, and face: Denies mucositis or sore throat Respiratory: Denies cough, dyspnea or wheezes Cardiovascular: Denies  palpitation, chest discomfort or lower extremity swelling Gastrointestinal:  Denies nausea, heartburn or change in bowel habits Skin: Denies abnormal skin rashes Lymphatics: Denies new lymphadenopathy or easy bruising Neurological:Denies numbness, tingling or new weaknesses Behavioral/Psych: Mood is stable, no new changes  All other systems were reviewed with the patient and are negative.  MEDICAL HISTORY:  Past Medical History:  Diagnosis Date   Cancer ([place])    GERD (gastroesophageal reflux disease)    Hyperlipemia    Hypertension     SURGICAL HISTORY: Past Surgical History:  Procedure Laterality Date   arm fracture surgery Right    colon polyps removed     ESOPHAGOGASTRODUODENOSCOPY (EGD) WITH PROPOFOL N/A 06/14/2018   Procedure: ESOPHAGOGASTRODUODENOSCOPY (EGD) WITH PROPOFOL;  Surgeon: [name], MD;  Location: [facility];  Service: Endoscopy;  Laterality: N/A;   EUS N/A 06/14/2018   Procedure: ESOPHAGEAL ENDOSCOPIC ULTRASOUND (EUS) RADIAL;  Surgeon: [name], MD;  Location: [facility];  Service: Endoscopy;  Laterality: N/A;   FINE NEEDLE ASPIRATION N/A 06/14/2018   Procedure: FINE NEEDLE ASPIRATION (FNA) LINEAR;  Surgeon: [name], MD;  Location: [facility];  Service: Endoscopy;  Laterality: N/A;   TONSILLECTOMY      I have reviewed the social history and family history with the patient and they are unchanged from previous note.  ALLERGIES:  has No Known Allergies.  MEDICATIONS:  Current Outpatient Medications  Medication Sig Dispense Refill   allopurinol (ZYLOPRIM) 300 MG tablet TAKE 1 TABLET BY MOUTH TWICE A DAY 60 tablet 0   amLODipine (NORVASC) 10 MG tablet Take 1 tablet (10 mg total) by mouth daily. 90 tablet 0   aspirin-sod bicarb-citric acid (ALKA-SELTZER) 325 MG TBEF tablet Take  650 mg by mouth every 6 (six) hours as needed.     nicotine (NICODERM CQ - DOSED IN MG/24 HOURS) 14 mg/24hr patch Place 1 patch (14 mg total) onto  the skin daily. 28 patch 0   Omega-3 Fatty Acids (FISH OIL) 1000 MG CAPS Take 1 capsule by mouth daily.     ondansetron (ZOFRAN) 8 MG tablet Take 1 tablet (8 mg total) by mouth 2 (two) times daily as needed for refractory nausea / vomiting. Start on day 3 after carboplatin chemo. 30 tablet 1   pantoprazole (PROTONIX) 20 MG tablet Take 1 tablet (20 mg total) by mouth daily. 30 tablet 2   prochlorperazine (COMPAZINE) 10 MG tablet Take 1 tablet (10 mg total) by mouth every 6 (six) hours as needed (Nausea or vomiting). 30 tablet 1   traMADol (ULTRAM) 50 MG tablet Take 1 tablet (50 mg total) by mouth every 12 (twelve) hours as needed. 30 tablet 0   No current facility-administered medications for this visit.    Facility-Administered Medications Ordered in Other Visits  Medication Dose Route Frequency Provider Last Rate Last Dose   CARBOplatin (PARAPLATIN) 500 mg in sodium chloride 0.9 % 250 mL chemo infusion  500 mg Intravenous Once Truitt Merle, MD       etoposide (VEPESID) 200 mg in sodium chloride 0.9 % 500 mL chemo infusion  100 mg/m2 (Treatment Plan Recorded) Intravenous Once Truitt Merle, MD        PHYSICAL EXAMINATION: ECOG PERFORMANCE STATUS: 1 - Symptomatic but completely ambulatory  Vitals:   08/12/19 0839  BP: (!) 154/84  Pulse: (!) 110  Resp: 17  Temp: 99.5 F (37.5 C)  SpO2: 100%   Filed Weights   08/12/19 0839  Weight: 165 lb 1.6 oz (74.9 kg)    GENERAL:alert, no distress and comfortable SKIN: skin color, texture, turgor are normal, no rashes or significant lesions EYES: normal, Conjunctiva are pink and non-injected, sclera clear  NECK: supple, thyroid normal size, non-tender, without nodularity LYMPH:  no palpable lymphadenopathy in the cervical, axillary  LUNGS: clear to auscultation and percussion with normal breathing effort HEART: regular rate & rhythm and no murmurs and no lower extremity edema ABDOMEN:abdomen soft, non-tender and normal bowel  sounds Musculoskeletal:no cyanosis of digits and no clubbing  NEURO: alert & oriented x 3 with fluent speech, no focal motor/sensory deficits  LABORATORY DATA:  I have reviewed the data as listed CBC Latest Ref Rng & Units 08/12/2019 07/30/2019 07/23/2019  WBC 4.0 - 10.5 K/uL 10.2 35.8(H) 13.6(H)  Hemoglobin 13.0 - 17.0 g/dL 11.3(L) 9.7(L) 9.7(L)  Hematocrit 39.0 - 52.0 % 34.3(L) 29.0(L) 30.0(L)  Platelets 150 - 400 K/uL 257 231 373     CMP Latest Ref Rng & Units 08/12/2019 07/30/2019 07/25/2019  Glucose 70 - 99 mg/dL 104(H) 125(H) 99  BUN 8 - 23 mg/dL 10 13 23   Creatinine 0.61 - 1.24 mg/dL 1.01 1.07 1.33(H)  Sodium 135 - 145 mmol/L 145 142 140  Potassium 3.5 - 5.1 mmol/L 3.9 4.8 4.2  Chloride 98 - 111 mmol/L 109 108 106  CO2 22 - 32 mmol/L 22 23 23   Calcium 8.9 - 10.3 mg/dL 9.2 9.3 9.4  Total Protein 6.5 - 8.1 g/dL 7.7 7.4 7.6  Total Bilirubin 0.3 - 1.2 mg/dL 0.7 0.4 0.3  Alkaline Phos 38 - 126 U/L 188(H) 241(H) 159(H)  AST 15 - 41 U/L 16 13(L) 12(L)  ALT 0 - 44 U/L 8 12 10       RADIOGRAPHIC STUDIES:  I have personally reviewed the radiological images as listed and agreed with the findings in the report. No results found.   ASSESSMENT & PLAN:  Jeremy Mack is a 73 y.o. male with   1.Small Cell Lung Cancer,Right lung primary,Metastatic to liver,left lung, LNs andbone -I personally reviewedhis PETimagesfrom 05/28/19 with pt in detail,which shows diffuse metastatic cancer in liver, upper abdominal nodal mets,bilateral lung lesion, right mediastinal nodes and diffuse bone mets. -We discussed his liverbiopsyfrom 06/04/19 which shows metastatic Small Cell lung cancer,TTF(+). This is most consistent with lung primary.Given his mediastinal lymph node distribution, I think the right lung lesion is likely the primary tumor. -No brain metastasis as seen on 06/14/19 brain MRI.  -Wepreviouslydiscussed given his stage IV metastatic disease, his cancer is no longer curable but still  treatable. -On 06/10/19 I started him onfirst lineCarboplatin on day 1 and Etopside on day 1-3 with immunotherapy atezolizumab(Tecentriq) on day 1 treatment every 3 weeks for 6 cycles, based on the NCCNguideline.Plan to continue maintenance Tecentriq afterwardif he has good response. -For tumor lysis syndrome prevention he will continue Allopurinol.  -We discussed his CT CAP from 07/30/19 which showed more than 50% decrease in lung nodule, clear interval decrease in the numerous hepatic metastasis and resolution of mediastinal and abdominal lymphadenopathy. Overall excellent response to chemo. He has been tolerating chemo well overall, will continue  -Labs reviewed, CBC and CMP WNL except HG 11.3, ALP 188. TSH still pending. Overall adequate to proceed with C4 Carboplatin/Etopside/Tecentriq today.  -Plans to continue chemo for 6 cycles followed by maintenance Tecentriq for as long as he can tolerate and it controls his disease.  -I also discussed he has the option for consolidation RT of chest after chemo based on next scan. He is interested.  -F/u in 3 weeks   2. AKI -06/04/19 Cr at 1.70, previously normal.Possible related to dehydration vs mild tumor lysis -He is currently on Allopurinol with treatment. -resolved now   3. H/o pancreatic abscess  -imaging in 05/2018 suspicious for neoplasm, CA 19-9 was 1 -EUS and FNA per Dr. Ardis Hughs showed inflammation, no malignancy -s/p two 3-week courses of Augmentin in 05/2018 for pancreatic abscess, f/u CT showed near resolution of pancreatic abnormality -no pancreas mass on 04/2019 CT -I donot feel this is related to his new liver lesions  4. SmokingCessation,H/oalcohol use  -He reports a past history of alcohol use for 10-15 years, he does not drink now. -CT in 05/2018 was concerning for hepatic cirrhosis -has 30 year smoking history.  -Liver biopsy from 05/2019 shows metastatic small Cell Lung cancer, Likely related to his heavy smoking  history.  -He still smokes 1/2 ppd. I strongly encouraged him to quit smoking as this can further worsen his condition. He is agreeable and interested in help.  -Icalled in nicotine patchon7/9/20  5. Enlarged prostate, elevated PSA  -PSA in 03/2019 9.47, up from 6.8 two years ago -enlarged prostate on CT -Denies urinary symptoms  -He was referred to urology by PCP   6. Neck pain -She recently developed neck pain, positional, resolved with pain medication, possible muscular related  -He does have bone metastasis in the cervical spine, was evaluated by Dr. Lisbeth Renshaw.  Giving his excellent response to chemotherapy, resolved neck pain with pain medication, patient wants to hold radiation for now.  7.Goal of care discussion  -The patient understands the goal of care is palliative. -he is full code now   PLAN: -Labs reviewed and adequate to proceed with cycle 4 Carboplatin, Tecentriq  and Etopside today with GCSF on day 4 (pm) -lab, f/u and chemo Carboplatin, Tecentriq and Etopside in 3 and 6 weeks(etopside on day 1-3) -I provided a letter to post pone his court date until after chemo.    No problem-specific Assessment & Plan notes found for this encounter.   No orders of the defined types were placed in this encounter.  All questions were answered. The patient knows to call the clinic with any problems, questions or concerns. No barriers to learning was detected. I spent 20 minutes counseling the patient face to face. The total time spent in the appointment was 25 minutes and more than 50% was on counseling and review of test results     Truitt Merle, MD 08/12/2019   I, Joslyn Devon, am acting as scribe for Truitt Merle, MD.   I have reviewed the above documentation for accuracy and completeness, and I agree with the above.

## 2019-08-12 ENCOUNTER — Inpatient Hospital Stay: Payer: Medicare PPO

## 2019-08-12 ENCOUNTER — Inpatient Hospital Stay (HOSPITAL_BASED_OUTPATIENT_CLINIC_OR_DEPARTMENT_OTHER): Payer: Medicare PPO | Admitting: Hematology

## 2019-08-12 ENCOUNTER — Encounter: Payer: Self-pay | Admitting: Hematology

## 2019-08-12 ENCOUNTER — Other Ambulatory Visit: Payer: Self-pay

## 2019-08-12 ENCOUNTER — Telehealth: Payer: Self-pay | Admitting: Hematology

## 2019-08-12 VITALS — HR 98

## 2019-08-12 VITALS — BP 154/84 | HR 110 | Temp 99.5°F | Resp 17 | Ht 71.0 in | Wt 165.1 lb

## 2019-08-12 DIAGNOSIS — Z7189 Other specified counseling: Secondary | ICD-10-CM

## 2019-08-12 DIAGNOSIS — C3490 Malignant neoplasm of unspecified part of unspecified bronchus or lung: Secondary | ICD-10-CM

## 2019-08-12 DIAGNOSIS — I10 Essential (primary) hypertension: Secondary | ICD-10-CM

## 2019-08-12 DIAGNOSIS — Z5189 Encounter for other specified aftercare: Secondary | ICD-10-CM | POA: Diagnosis not present

## 2019-08-12 LAB — CMP (CANCER CENTER ONLY)
ALT: 8 U/L (ref 0–44)
AST: 16 U/L (ref 15–41)
Albumin: 3.8 g/dL (ref 3.5–5.0)
Alkaline Phosphatase: 188 U/L — ABNORMAL HIGH (ref 38–126)
Anion gap: 14 (ref 5–15)
BUN: 10 mg/dL (ref 8–23)
CO2: 22 mmol/L (ref 22–32)
Calcium: 9.2 mg/dL (ref 8.9–10.3)
Chloride: 109 mmol/L (ref 98–111)
Creatinine: 1.01 mg/dL (ref 0.61–1.24)
GFR, Est AFR Am: >60 mL/min (ref >60)
GFR, Estimated: >60 mL/min (ref >60)
Glucose, Bld: 104 mg/dL — ABNORMAL HIGH (ref 70–99)
Potassium: 3.9 mmol/L (ref 3.5–5.1)
Sodium: 145 mmol/L (ref 135–145)
Total Bilirubin: 0.7 mg/dL (ref 0.3–1.2)
Total Protein: 7.7 g/dL (ref 6.5–8.1)

## 2019-08-12 LAB — CBC WITH DIFFERENTIAL (CANCER CENTER ONLY)
Abs Immature Granulocytes: 0.11 10*3/uL — ABNORMAL HIGH (ref 0.00–0.07)
Basophils Absolute: 0 10*3/uL (ref 0.0–0.1)
Basophils Relative: 0 %
Eosinophils Absolute: 0 10*3/uL (ref 0.0–0.5)
Eosinophils Relative: 0 %
HCT: 34.3 % — ABNORMAL LOW (ref 39.0–52.0)
Hemoglobin: 11.3 g/dL — ABNORMAL LOW (ref 13.0–17.0)
Immature Granulocytes: 1 %
Lymphocytes Relative: 14 %
Lymphs Abs: 1.4 10*3/uL (ref 0.7–4.0)
MCH: 34.9 pg — ABNORMAL HIGH (ref 26.0–34.0)
MCHC: 32.9 g/dL (ref 30.0–36.0)
MCV: 105.9 fL — ABNORMAL HIGH (ref 80.0–100.0)
Monocytes Absolute: 1.1 10*3/uL — ABNORMAL HIGH (ref 0.1–1.0)
Monocytes Relative: 11 %
Neutro Abs: 7.5 10*3/uL (ref 1.7–7.7)
Neutrophils Relative %: 74 %
Platelet Count: 257 10*3/uL (ref 150–400)
RBC: 3.24 MIL/uL — ABNORMAL LOW (ref 4.22–5.81)
RDW: 24.5 % — ABNORMAL HIGH (ref 11.5–15.5)
WBC Count: 10.2 10*3/uL (ref 4.0–10.5)
nRBC: 0.6 % — ABNORMAL HIGH (ref 0.0–0.2)

## 2019-08-12 LAB — TSH: TSH: 1.044 u[IU]/mL (ref 0.320–4.118)

## 2019-08-12 MED ORDER — SODIUM CHLORIDE 0.9 % IV SOLN
1200.0000 mg | Freq: Once | INTRAVENOUS | Status: AC
  Administered 2019-08-12: 1200 mg via INTRAVENOUS
  Filled 2019-08-12: qty 20

## 2019-08-12 MED ORDER — SODIUM CHLORIDE 0.9 % IV SOLN
Freq: Once | INTRAVENOUS | Status: AC
  Administered 2019-08-12: 10:00:00 via INTRAVENOUS
  Filled 2019-08-12: qty 250

## 2019-08-12 MED ORDER — SODIUM CHLORIDE 0.9 % IV SOLN
497.5000 mg | Freq: Once | INTRAVENOUS | Status: AC
  Administered 2019-08-12: 500 mg via INTRAVENOUS
  Filled 2019-08-12: qty 50

## 2019-08-12 MED ORDER — SODIUM CHLORIDE 0.9 % IV SOLN
Freq: Once | INTRAVENOUS | Status: AC
  Administered 2019-08-12: 10:00:00 via INTRAVENOUS
  Filled 2019-08-12: qty 5

## 2019-08-12 MED ORDER — SODIUM CHLORIDE 0.9 % IV SOLN
100.0000 mg/m2 | Freq: Once | INTRAVENOUS | Status: AC
  Administered 2019-08-12: 200 mg via INTRAVENOUS
  Filled 2019-08-12: qty 10

## 2019-08-12 MED ORDER — PALONOSETRON HCL INJECTION 0.25 MG/5ML
INTRAVENOUS | Status: AC
  Filled 2019-08-12: qty 5

## 2019-08-12 MED ORDER — PALONOSETRON HCL INJECTION 0.25 MG/5ML
0.2500 mg | Freq: Once | INTRAVENOUS | Status: AC
  Administered 2019-08-12: 0.25 mg via INTRAVENOUS

## 2019-08-12 NOTE — Telephone Encounter (Signed)
Scheduled appt per 9/14 los.

## 2019-08-12 NOTE — Patient Instructions (Signed)
[facility] Discharge Instructions for Patients Receiving Chemotherapy  Today you received the following chemotherapy agents: Atezolizumab (Tecentriq), Etoposide (Vepesid, VP-16), and Carboplatin (Paraplatin)  To help prevent nausea and vomiting after your treatment, we encourage you to take your nausea medication as directed. Received Aloxi during treatment today-->Take your Compazine prescription (not your Zofran) for the next 3 days as needed.    If you develop nausea and vomiting that is not controlled by your nausea medication, call the clinic.   BELOW ARE SYMPTOMS THAT SHOULD BE REPORTED IMMEDIATELY:  *FEVER GREATER THAN 100.5 F  *CHILLS WITH OR WITHOUT FEVER  NAUSEA AND VOMITING THAT IS NOT CONTROLLED WITH YOUR NAUSEA MEDICATION  *UNUSUAL SHORTNESS OF BREATH  *UNUSUAL BRUISING OR BLEEDING  TENDERNESS IN MOUTH AND THROAT WITH OR WITHOUT PRESENCE OF ULCERS  *URINARY PROBLEMS  *BOWEL PROBLEMS  UNUSUAL RASH Items with * indicate a potential emergency and should be followed up as soon as possible.  Feel free to call the clinic should you have any questions or concerns. The clinic phone number is [phone].  Please show the [facility] at check-in to the Emergency Department and triage nurse.  Coronavirus (COVID-19) Are you at risk?  Are you at risk for the Coronavirus (COVID-19)?  To be considered HIGH RISK for Coronavirus (COVID-19), you have to meet the following criteria:  . Traveled to [place], [place], [place], [place] or [place]; or in the [place] to [place], [place], [place], or [place]; and have fever, cough, and shortness of breath within the last 2 weeks of travel OR . Been in close contact with a person diagnosed with COVID-19 within the last 2 weeks and have fever, cough, and shortness of breath . IF YOU DO NOT MEET THESE CRITERIA, YOU ARE CONSIDERED LOW RISK FOR COVID-19.  What to do if you are HIGH RISK for  COVID-19?  Marland Kitchen If you are having a medical emergency, call 911. . Seek medical care right away. Before you go to a doctor's office, urgent care or emergency department, call ahead and tell them about your recent travel, contact with someone diagnosed with COVID-19, and your symptoms. You should receive instructions from your physician's office regarding next steps of care.  . When you arrive at healthcare provider, tell the healthcare staff immediately you have returned from visiting [place], [place], [place], [place] or [place]; or traveled in the [place] to [place], [place], [place], or [place]; in the last two weeks or you have been in close contact with a person diagnosed with COVID-19 in the last 2 weeks.   . Tell the health care staff about your symptoms: fever, cough and shortness of breath. . After you have been seen by a medical provider, you will be either: o Tested for (COVID-19) and discharged home on quarantine except to seek medical care if symptoms worsen, and asked to  - Stay home and avoid contact with others until you get your results (4-5 days)  - Avoid travel on public transportation if possible (such as bus, train, or airplane) or o Sent to the Emergency Department by EMS for evaluation, COVID-19 testing, and possible admission depending on your condition and test results.  What to do if you are LOW RISK for COVID-19?  Reduce your risk of any infection by using the same precautions used for avoiding the common cold or flu:  Marland Kitchen Wash your hands often with soap and warm water for at least 20  seconds.  If soap and water are not readily available, use an alcohol-based hand sanitizer with at least 60% alcohol.  . If coughing or sneezing, cover your mouth and nose by coughing or sneezing into the elbow areas of your shirt or coat, into a tissue or into your sleeve (not your hands). . Avoid shaking hands with others and consider head nods or verbal greetings only. . Avoid  touching your eyes, nose, or mouth with unwashed hands.  . Avoid close contact with people who are sick. . Avoid places or events with large numbers of people in one location, like concerts or sporting events. . Carefully consider travel plans you have or are making. . If you are planning any travel outside or inside the [place], visit the CDC's Travelers' Health webpage for the latest health notices. . If you have some symptoms but not all symptoms, continue to monitor at home and seek medical attention if your symptoms worsen. . If you are having a medical emergency, call 911.   [place] / e-Visit: [email or web address]         [facility]: [facility]: [phone]                   [facility]: [phone]

## 2019-08-12 NOTE — Progress Notes (Signed)
Spoke w/ [name], use dose of carboplatin 500mg  today (AUC=5, improved renal function) as patient has been tolerating treatment well thus far.   Demetrius Charity, PharmD, [facility] Pharmacist Pharmacy Phone: [phone] 08/12/2019

## 2019-08-13 ENCOUNTER — Other Ambulatory Visit: Payer: Self-pay

## 2019-08-13 ENCOUNTER — Inpatient Hospital Stay: Payer: Medicare PPO

## 2019-08-13 VITALS — BP 150/84 | HR 96 | Temp 98.9°F

## 2019-08-13 DIAGNOSIS — C3490 Malignant neoplasm of unspecified part of unspecified bronchus or lung: Secondary | ICD-10-CM

## 2019-08-13 DIAGNOSIS — Z7189 Other specified counseling: Secondary | ICD-10-CM

## 2019-08-13 DIAGNOSIS — Z5189 Encounter for other specified aftercare: Secondary | ICD-10-CM | POA: Diagnosis not present

## 2019-08-13 MED ORDER — DEXAMETHASONE SODIUM PHOSPHATE 10 MG/ML IJ SOLN
INTRAMUSCULAR | Status: AC
  Filled 2019-08-13: qty 1

## 2019-08-13 MED ORDER — SODIUM CHLORIDE 0.9 % IV SOLN
100.0000 mg/m2 | Freq: Once | INTRAVENOUS | Status: AC
  Administered 2019-08-13: 200 mg via INTRAVENOUS
  Filled 2019-08-13: qty 10

## 2019-08-13 MED ORDER — SODIUM CHLORIDE 0.9 % IV SOLN
Freq: Once | INTRAVENOUS | Status: AC
  Administered 2019-08-13: 10:00:00 via INTRAVENOUS
  Filled 2019-08-13: qty 250

## 2019-08-13 MED ORDER — DEXAMETHASONE SODIUM PHOSPHATE 10 MG/ML IJ SOLN
10.0000 mg | Freq: Once | INTRAMUSCULAR | Status: AC
  Administered 2019-08-13: 10:00:00 10 mg via INTRAVENOUS

## 2019-08-13 NOTE — Patient Instructions (Signed)
[facility] Discharge Instructions for Patients Receiving Chemotherapy  Today you received the following chemotherapy agents :  Etoposide.  To help prevent nausea and vomiting after your treatment, we encourage you to take your nausea medication as prescribed.   If you develop nausea and vomiting that is not controlled by your nausea medication, call the clinic.   BELOW ARE SYMPTOMS THAT SHOULD BE REPORTED IMMEDIATELY:  *FEVER GREATER THAN 100.5 F  *CHILLS WITH OR WITHOUT FEVER  NAUSEA AND VOMITING THAT IS NOT CONTROLLED WITH YOUR NAUSEA MEDICATION  *UNUSUAL SHORTNESS OF BREATH  *UNUSUAL BRUISING OR BLEEDING  TENDERNESS IN MOUTH AND THROAT WITH OR WITHOUT PRESENCE OF ULCERS  *URINARY PROBLEMS  *BOWEL PROBLEMS  UNUSUAL RASH Items with * indicate a potential emergency and should be followed up as soon as possible.  Feel free to call the clinic should you have any questions or concerns. The clinic phone number is [phone].  Please show the [facility] at check-in to the Emergency Department and triage nurse.

## 2019-08-14 ENCOUNTER — Other Ambulatory Visit: Payer: Self-pay

## 2019-08-14 ENCOUNTER — Inpatient Hospital Stay: Payer: Medicare PPO

## 2019-08-14 VITALS — BP 139/83 | HR 99 | Temp 98.9°F | Resp 17

## 2019-08-14 DIAGNOSIS — Z5189 Encounter for other specified aftercare: Secondary | ICD-10-CM | POA: Diagnosis not present

## 2019-08-14 DIAGNOSIS — C3490 Malignant neoplasm of unspecified part of unspecified bronchus or lung: Secondary | ICD-10-CM

## 2019-08-14 DIAGNOSIS — Z23 Encounter for immunization: Secondary | ICD-10-CM

## 2019-08-14 DIAGNOSIS — Z7189 Other specified counseling: Secondary | ICD-10-CM

## 2019-08-14 MED ORDER — SODIUM CHLORIDE 0.9 % IV SOLN
100.0000 mg/m2 | Freq: Once | INTRAVENOUS | Status: AC
  Administered 2019-08-14: 200 mg via INTRAVENOUS
  Filled 2019-08-14: qty 10

## 2019-08-14 MED ORDER — SODIUM CHLORIDE 0.9 % IV SOLN
Freq: Once | INTRAVENOUS | Status: AC
  Administered 2019-08-14: 10:00:00 via INTRAVENOUS
  Filled 2019-08-14: qty 250

## 2019-08-14 MED ORDER — DEXAMETHASONE SODIUM PHOSPHATE 10 MG/ML IJ SOLN
10.0000 mg | Freq: Once | INTRAMUSCULAR | Status: AC
  Administered 2019-08-14: 10:00:00 10 mg via INTRAVENOUS

## 2019-08-14 MED ORDER — DEXAMETHASONE SODIUM PHOSPHATE 10 MG/ML IJ SOLN
INTRAMUSCULAR | Status: AC
  Filled 2019-08-14: qty 1

## 2019-08-14 MED ORDER — INFLUENZA VAC A&B SA ADJ QUAD 0.5 ML IM PRSY
PREFILLED_SYRINGE | INTRAMUSCULAR | Status: AC
  Filled 2019-08-14: qty 0.5

## 2019-08-14 MED ORDER — INFLUENZA VAC A&B SA ADJ QUAD 0.5 ML IM PRSY
0.5000 mL | PREFILLED_SYRINGE | Freq: Once | INTRAMUSCULAR | Status: AC
  Administered 2019-08-14: 0.5 mL via INTRAMUSCULAR

## 2019-08-14 NOTE — Patient Instructions (Signed)
[facility] Discharge Instructions for Patients Receiving Chemotherapy  Today you received the following chemotherapy agents: Etoposide  To help prevent nausea and vomiting after your treatment, we encourage you to take your nausea medication as directed.   If you develop nausea and vomiting that is not controlled by your nausea medication, call the clinic.   BELOW ARE SYMPTOMS THAT SHOULD BE REPORTED IMMEDIATELY:  *FEVER GREATER THAN 100.5 F  *CHILLS WITH OR WITHOUT FEVER  NAUSEA AND VOMITING THAT IS NOT CONTROLLED WITH YOUR NAUSEA MEDICATION  *UNUSUAL SHORTNESS OF BREATH  *UNUSUAL BRUISING OR BLEEDING  TENDERNESS IN MOUTH AND THROAT WITH OR WITHOUT PRESENCE OF ULCERS  *URINARY PROBLEMS  *BOWEL PROBLEMS  UNUSUAL RASH Items with * indicate a potential emergency and should be followed up as soon as possible.  Feel free to call the clinic should you have any questions or concerns. The clinic phone number is [phone].  Please show the [facility] at check-in to the Emergency Department and triage nurse.

## 2019-08-14 NOTE — Addendum Note (Signed)
Addended by: Truitt Merle on: 08/14/2019 07:09 AM   Modules accepted: Orders

## 2019-08-15 ENCOUNTER — Inpatient Hospital Stay: Payer: Medicare PPO

## 2019-08-15 ENCOUNTER — Other Ambulatory Visit: Payer: Self-pay

## 2019-08-15 VITALS — BP 143/87 | HR 98 | Temp 98.9°F | Resp 18

## 2019-08-15 DIAGNOSIS — C3490 Malignant neoplasm of unspecified part of unspecified bronchus or lung: Secondary | ICD-10-CM

## 2019-08-15 DIAGNOSIS — Z5189 Encounter for other specified aftercare: Secondary | ICD-10-CM | POA: Diagnosis not present

## 2019-08-15 DIAGNOSIS — Z7189 Other specified counseling: Secondary | ICD-10-CM

## 2019-08-15 MED ORDER — PEGFILGRASTIM-JMDB 6 MG/0.6ML ~~LOC~~ SOSY
6.0000 mg | PREFILLED_SYRINGE | Freq: Once | SUBCUTANEOUS | Status: AC
  Administered 2019-08-15: 14:00:00 6 mg via SUBCUTANEOUS

## 2019-08-15 MED ORDER — PEGFILGRASTIM-JMDB 6 MG/0.6ML ~~LOC~~ SOSY
PREFILLED_SYRINGE | SUBCUTANEOUS | Status: AC
  Filled 2019-08-15: qty 0.6

## 2019-08-15 NOTE — Patient Instructions (Signed)

## 2019-08-21 IMAGING — CT CT ABD-PELV W/ CM
1 of 3 series · 11 of 32 positions shown, 16 images · IV contrast (APPLIED)
Comparison: None.

CLINICAL DATA: Two-month history of generalized abdominal pain,
anorexia and urinary frequency. Current history of hypertension and
hyperlipidemia.

EXAM:
CT ABDOMEN AND PELVIS WITH CONTRAST
TECHNIQUE: Multidetector CT imaging of the abdomen and pelvis was performed
using the standard protocol following bolus administration of
intravenous contrast.
CONTRAST:  100mL [id number] IOPAMIDOL INJECTION 61% IV. Oral
contrast was also administered.

[Series 2: abd/pelvis w/cm · axial · 0.77mm/px · z∈[-375,+15]mm · 11 of 94 slices shown, 16 images]
[im 8/94  soft-tissue]
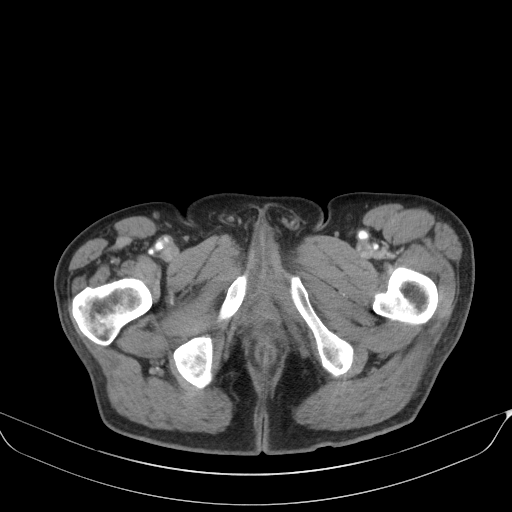
[im 8/94  bone]
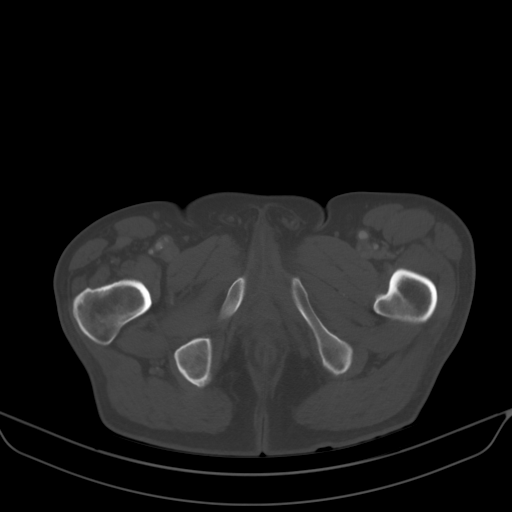
[im 15/94  soft-tissue]
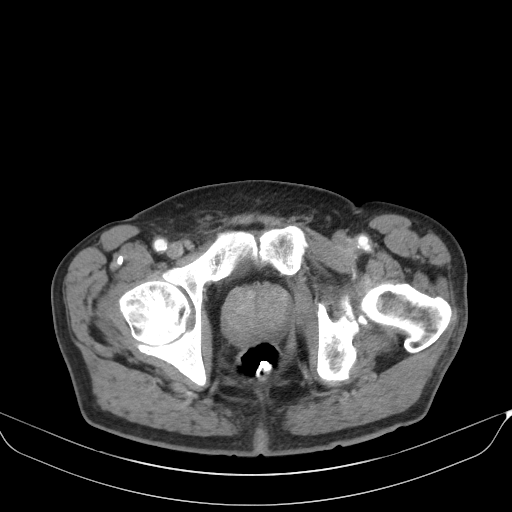
[im 29/94  soft-tissue]
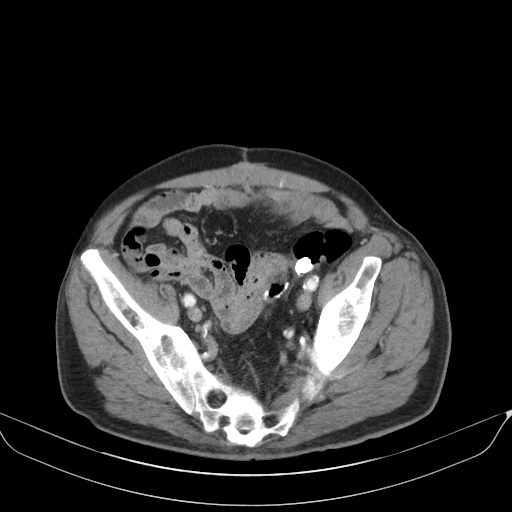
[im 36/94  soft-tissue]
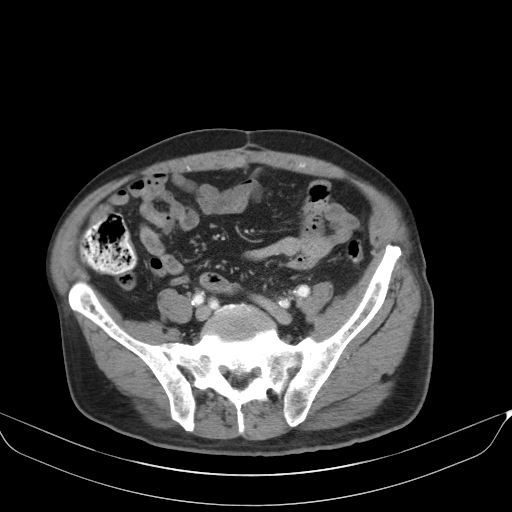
[im 43/94  soft-tissue]
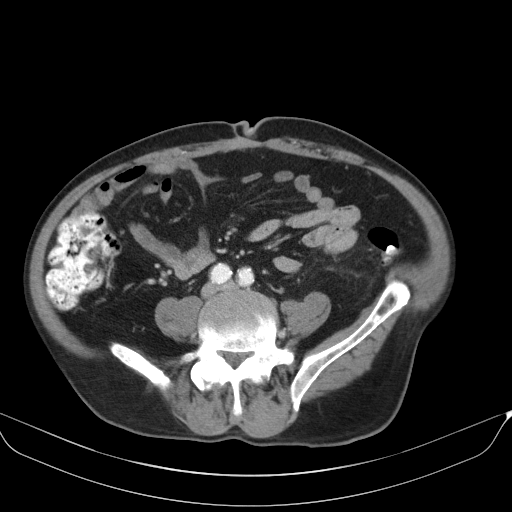
[im 51/94  soft-tissue]
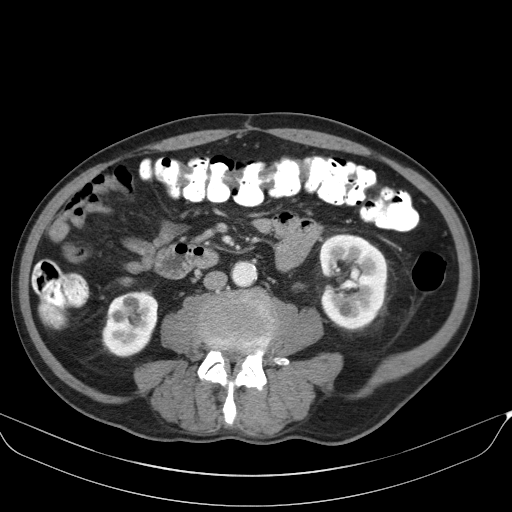
[im 58/94  soft-tissue]
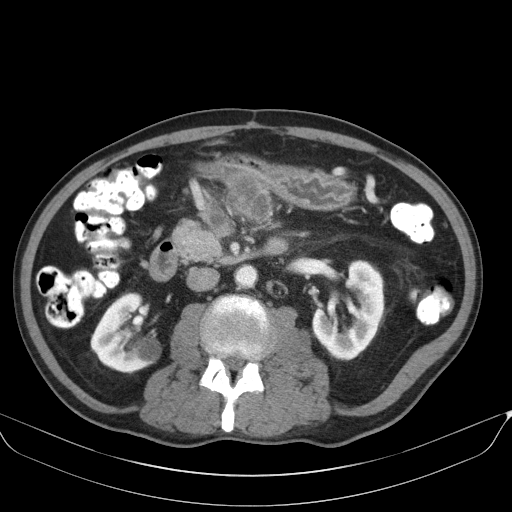
[im 65/94  lung]
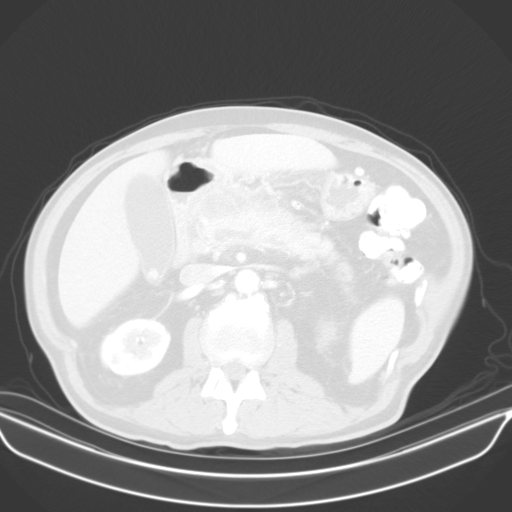
[im 72/94  soft-tissue]
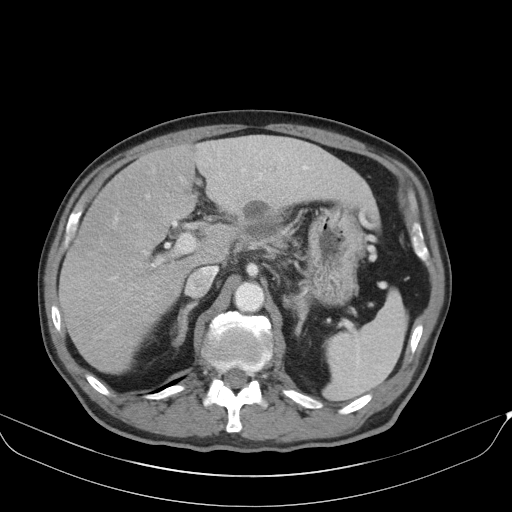
[im 72/94  lung]
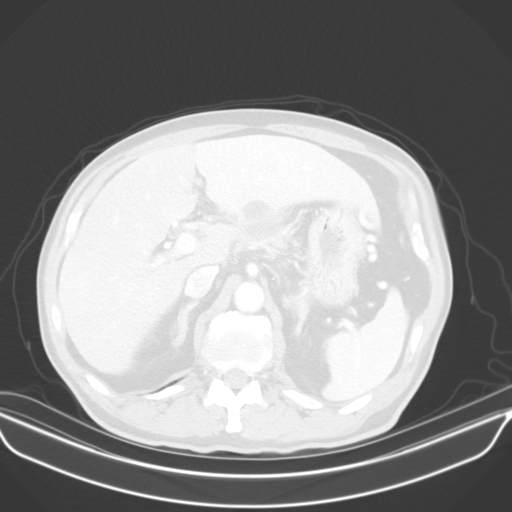
[im 79/94  soft-tissue]
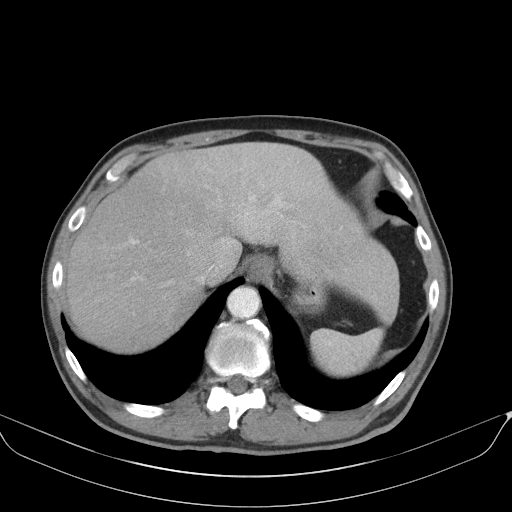
[im 79/94  lung]
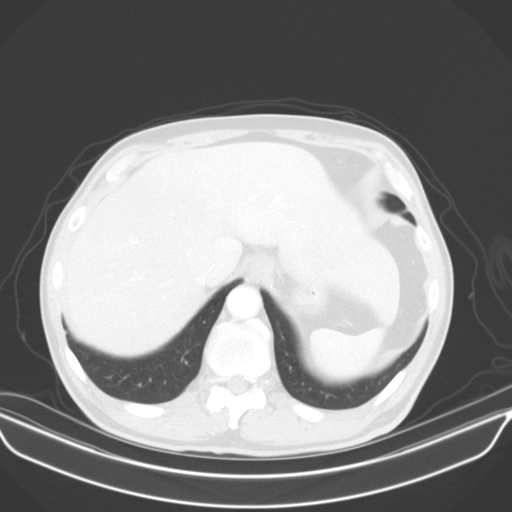
[im 79/94  bone]
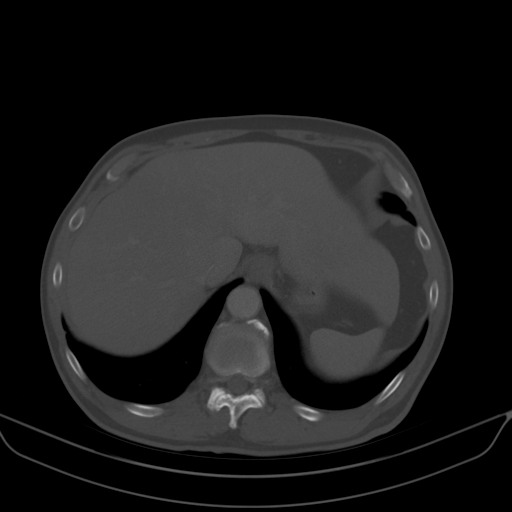
[im 86/94  soft-tissue]
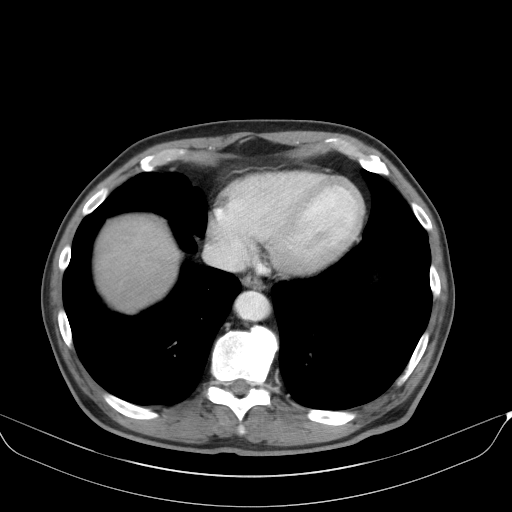
[im 86/94  lung]
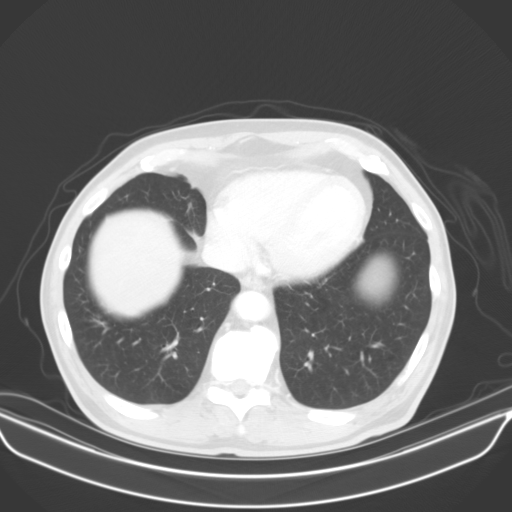

[11 of 32 positions shown; findings below may reference images not displayed]

FINDINGS: Lower chest: Mild bronchiectasis involving the lower lobes
bilaterally. Visualized lung bases clear apart from minimal scarring
in the RIGHT MIDDLE LOBE. Heart size upper normal.

Hepatobiliary: Geographic low attenuation involving the entire RIGHT
lobe with normal enhancement of the LEFT lobe. Anatomic variant in
that the LEFT lobe extends well across the midline into the LEFT
UPPER QUADRANT. Relative enlargement of the LEFT lobe and caudate
lobe with mildly irregular hepatic contour. No hepatic parenchymal
masses.

Gallstones within the gallbladder. No evidence of cholecystitis.
Mild dilation of the extrahepatic bile duct and the central
intrahepatic ducts, maximum common bile duct diameter approximately
11 mm.

Pancreas: Large macrocystic mass involving the proximal body of the
pancreas measuring approximately 4.9 x 7.3 x 8.8 cm (series 2, image
26 and coronal image 29). Mild edema in the peripancreatic fat.

Spleen: Mass involving the LATERAL spleen just below the level of
the hilum measuring approximately 2.0 x 1.5 cm (series 2, image 25).
Spleen normal in size.

Adrenals/Urinary Tract: Enhancing mass involving the LEFT adrenal
gland measuring approximately 2.5 x 2.5 cm (series 2, image 24).

Cortical cysts involving both kidneys, the largest measuring
approximately 2.5 cm arising from the mid RIGHT kidney. No solid
renal masses. No hydronephrosis. No urinary tract calculi. Urinary
bladder decompressed and unremarkable.

Stomach/Bowel: Stomach normal in appearance, though there is
extrinsic mass effect on the distal gastric body and antrum due to
the pancreatic mass. Normal-appearing small bowel. Scattered
diverticula involving the ascending colon and more substantial
diverticulosis involving the descending and sigmoid colon without
evidence of acute diverticulitis. Appendix not clearly visible, but
no evidence of periappendiceal inflammation.

Vascular/Lymphatic: Severe aorto-iliofemoral atherosclerosis.
BILATERAL common iliac artery aneurysms measuring 1.7 cm on the
RIGHT and 1.9 cm on the LEFT. Occlusion of the RIGHT superficial
femoral artery at its origin.

The mass surrounds the celiac artery at its bifurcation and also
surrounds the proximal LEFT gastric artery and the proper hepatic
artery.

Narrowing of the LEFT renal vein as it courses between the aorta and
the SUPERIOR mesenteric artery. The SUPERIOR mesenteric vein and the
splenic vein are likely occluded by the pancreatic mass. The portal
vein is patent. A large collateral vein is present in the LEFT UPPER
QUADRANT emanating from the spleen and extending across the midline
into the RIGHT UPPER QUADRANT to ultimately empty into the portal
vein.

Mildly enlarged lymph nodes in the gastrohepatic ligament, the porta
hepatis, the portacaval region, the retrocaval region of the
retroperitoneum and the LEFT periaortic region of the
retroperitoneum, indicating locoregional lymphadenopathy related to
the pancreatic mass. No pathologic lymphadenopathy elsewhere.

Reproductive: Moderately enlarged prostate gland with heterogeneous
nodular enhancement, the most prominent enhancing nodule in the
median lobe. Normal-appearing seminal vesicles.

Other: Midline abdominal surgical scar. No evidence of incisional
hernia.

Musculoskeletal: DISH involving the LOWER thoracic and UPPER lumbar
spine. Facet degenerative changes at L3-4 and L4-5. Moderate
multifactorial spinal stenosis at L2-3, severe multifactorial spinal
stenosis at L3-4 and L4-5. Ankylosis of the sacroiliac joints.
Degenerative changes involving both hip joints. No acute findings.
IMPRESSION: 1. Macrocystic neoplasm involving the proximal body of the pancreas
with measurements given above.
2. Likely metastatic locoregional lymphadenopathy as detailed above.
3. Solid mass involving the LEFT adrenal gland, likely metastatic
disease.
4. Solid mass involving the spleen, possibly representing metastatic
disease.
5. Likely occlusion of the SUPERIOR mesenteric vein and the splenic
vein due to the mass. The mass surrounds the celiac artery at its
bifurcation and surrounds the LEFT gastric artery and the proper
hepatic artery.
6. Mild intra and extrahepatic biliary ductal dilation.
7. Hepatic cirrhosis is suspected. Geographic steatosis involving
the RIGHT lobe of the liver. No hepatic parenchymal masses.
8. Diffuse colonic diverticulosis without evidence of acute
diverticulitis.
9. Moderate prostate gland enlargement with heterogeneous nodular
enhancement. The largest enhancing nodule is in the median lobe.
10. BILATERAL common iliac artery aneurysms, 1.7 cm on the RIGHT and
1.9 cm on the LEFT.
11. Occlusion of the RIGHT superficial femoral artery at its origin.
12.  Aortic Atherosclerosis ([id number])

## 2019-08-22 ENCOUNTER — Other Ambulatory Visit: Payer: Self-pay | Admitting: Radiology

## 2019-08-23 ENCOUNTER — Other Ambulatory Visit: Payer: Self-pay | Admitting: Radiology

## 2019-08-26 ENCOUNTER — Other Ambulatory Visit: Payer: Self-pay

## 2019-08-26 ENCOUNTER — Encounter (HOSPITAL_COMMUNITY): Payer: Self-pay

## 2019-08-26 ENCOUNTER — Ambulatory Visit (HOSPITAL_COMMUNITY)
Admission: RE | Admit: 2019-08-26 | Discharge: 2019-08-26 | Disposition: A | Discharge status: Home / Self Care | Payer: Medicare PPO | Source: Ambulatory Visit | Attending: Hematology | Admitting: Hematology

## 2019-08-26 ENCOUNTER — Emergency Department (HOSPITAL_COMMUNITY): Admission: EM | Admit: 2019-08-26 | Discharge: 2019-08-26 | Discharge status: Absent without leave | Payer: Medicare PPO

## 2019-08-26 DIAGNOSIS — Z7982 Long term (current) use of aspirin: Secondary | ICD-10-CM | POA: Insufficient documentation

## 2019-08-26 DIAGNOSIS — I10 Essential (primary) hypertension: Secondary | ICD-10-CM | POA: Insufficient documentation

## 2019-08-26 DIAGNOSIS — E785 Hyperlipidemia, unspecified: Secondary | ICD-10-CM | POA: Insufficient documentation

## 2019-08-26 DIAGNOSIS — Z79899 Other long term (current) drug therapy: Secondary | ICD-10-CM | POA: Diagnosis not present

## 2019-08-26 DIAGNOSIS — C3490 Malignant neoplasm of unspecified part of unspecified bronchus or lung: Secondary | ICD-10-CM | POA: Insufficient documentation

## 2019-08-26 DIAGNOSIS — K219 Gastro-esophageal reflux disease without esophagitis: Secondary | ICD-10-CM | POA: Insufficient documentation

## 2019-08-26 HISTORY — PX: IR IMAGING GUIDED PORT INSERTION: IMG5740

## 2019-08-26 LAB — CBC
HCT: 30.3 % — ABNORMAL LOW (ref 39.0–52.0)
Hemoglobin: 9.5 g/dL — ABNORMAL LOW (ref 13.0–17.0)
MCH: 34.8 pg — ABNORMAL HIGH (ref 26.0–34.0)
MCHC: 31.4 g/dL (ref 30.0–36.0)
MCV: 111 fL — ABNORMAL HIGH (ref 80.0–100.0)
Platelets: 86 10*3/uL — ABNORMAL LOW (ref 150–400)
RBC: 2.73 MIL/uL — ABNORMAL LOW (ref 4.22–5.81)
RDW: 21.4 % — ABNORMAL HIGH (ref 11.5–15.5)
WBC: 7.7 10*3/uL (ref 4.0–10.5)
nRBC: 1.2 % — ABNORMAL HIGH (ref 0.0–0.2)

## 2019-08-26 LAB — BASIC METABOLIC PANEL
Anion gap: 14 (ref 5–15)
BUN: 9 mg/dL (ref 8–23)
CO2: 21 mmol/L — ABNORMAL LOW (ref 22–32)
Calcium: 9.1 mg/dL (ref 8.9–10.3)
Chloride: 107 mmol/L (ref 98–111)
Creatinine, Ser: 0.99 mg/dL (ref 0.61–1.24)
GFR calc Af Amer: >60 mL/min (ref >60)
GFR calc non Af Amer: >60 mL/min (ref >60)
Glucose, Bld: 128 mg/dL — ABNORMAL HIGH (ref 70–99)
Potassium: 3.6 mmol/L (ref 3.5–5.1)
Sodium: 142 mmol/L (ref 135–145)

## 2019-08-26 LAB — PROTIME-INR
INR: 1.1 (ref 0.8–1.2)
Prothrombin Time: 13.8 seconds (ref 11.4–15.2)

## 2019-08-26 MED ORDER — LIDOCAINE HCL (PF) 1 % IJ SOLN
INTRAMUSCULAR | Status: AC | PRN
  Administered 2019-08-26: 30 mL

## 2019-08-26 MED ORDER — CEFAZOLIN SODIUM-DEXTROSE 2-4 GM/100ML-% IV SOLN
INTRAVENOUS | Status: AC | PRN
  Administered 2019-08-26: 2 g via INTRAVENOUS

## 2019-08-26 MED ORDER — CEFAZOLIN SODIUM-DEXTROSE 2-4 GM/100ML-% IV SOLN: 2.0000 g | INTRAVENOUS | Status: DC

## 2019-08-26 MED ORDER — FENTANYL CITRATE (PF) 100 MCG/2ML IJ SOLN
INTRAMUSCULAR | Status: AC
  Filled 2019-08-26: qty 2

## 2019-08-26 MED ORDER — SODIUM CHLORIDE 0.9 % IV SOLN: INTRAVENOUS | Status: DC

## 2019-08-26 MED ORDER — HEPARIN SOD (PORK) LOCK FLUSH 100 UNIT/ML IV SOLN
INTRAVENOUS | Status: AC
  Filled 2019-08-26: qty 5

## 2019-08-26 MED ORDER — FENTANYL CITRATE (PF) 100 MCG/2ML IJ SOLN
INTRAMUSCULAR | Status: AC | PRN
  Administered 2019-08-26 (×2): 50 ug via INTRAVENOUS

## 2019-08-26 MED ORDER — MIDAZOLAM HCL 2 MG/2ML IJ SOLN
INTRAMUSCULAR | Status: AC
  Filled 2019-08-26: qty 2

## 2019-08-26 MED ORDER — CEFAZOLIN (ANCEF) 1 G IV SOLR: INTRAVENOUS | Status: AC | PRN

## 2019-08-26 MED ORDER — CEFAZOLIN SODIUM-DEXTROSE 2-4 GM/100ML-% IV SOLN
INTRAVENOUS | Status: AC
  Filled 2019-08-26: qty 100

## 2019-08-26 MED ORDER — SODIUM CHLORIDE 0.9 % IV SOLN
Freq: Once | INTRAVENOUS | Status: AC
  Administered 2019-08-26: 13:00:00 20 mL/h via INTRAVENOUS

## 2019-08-26 MED ORDER — MIDAZOLAM HCL 2 MG/2ML IJ SOLN
INTRAMUSCULAR | Status: AC | PRN
  Administered 2019-08-26 (×2): 1 mg via INTRAVENOUS

## 2019-08-26 MED ORDER — LIDOCAINE HCL 1 % IJ SOLN
INTRAMUSCULAR | Status: AC
  Filled 2019-08-26: qty 20

## 2019-08-26 MED ORDER — LIDOCAINE-EPINEPHRINE 1 %-1:100000 IJ SOLN
INTRAMUSCULAR | Status: AC
  Filled 2019-08-26: qty 1

## 2019-08-26 NOTE — Discharge Instructions (Signed)
DO  NOT APPLY ANY EMLA CREAM OR LOTION TO PORT SITE UNTIL ALL GLUE AND STERI STRIPS HAVE COME OFF ON THEIR OWN   Implanted Port Insertion, Care After This sheet gives you information about how to care for yourself after your procedure. Your health care provider may also give you more specific instructions. If you have problems or questions, contact your health care provider. What can I expect after the procedure? After the procedure, it is common to have:  Discomfort at the port insertion site.  Bruising on the skin over the port. This should improve over 3-4 days. Follow these instructions at home: [facility] care  After your port is placed, you will get a manufacturer's information card. The card has information about your port. Keep this card with you at all times.  Take care of the port as told by your health care provider. Ask your health care provider if you or a family member can get training for taking care of the port at home. A home health care nurse may also take care of the port.  Make sure to remember what type of port you have. Incision care      Follow instructions from your health care provider about how to take care of your port insertion site. Make sure you: ? Wash your hands with soap and water before and after you change your bandage (dressing). If soap and water are not available, use hand sanitizer. ? Change your dressing as told by your health care provider. ? Leave stitches (sutures), skin glue, or adhesive strips in place. These skin closures may need to stay in place for 2 weeks or longer. If adhesive strip edges start to loosen and curl up, you may trim the loose edges. Do not remove adhesive strips completely unless your health care provider tells you to do that.  Check your port insertion site every day for signs of infection. Check for: ? Redness, swelling, or pain. ? Fluid or blood. ? Warmth. ? Pus or a bad smell. Activity  Return to your normal activities  as told by your health care provider. Ask your health care provider what activities are safe for you.  Do not lift anything that is heavier than 10 lb (4.5 kg), or the limit that you are told, until your health care provider says that it is safe. General instructions  Take over-the-counter and prescription medicines only as told by your health care provider.  Do not take baths, swim, or use a hot tub until your health care provider approves. Ask your health care provider if you may take showers. You may only be allowed to take sponge baths.  Do not drive for 24 hours if you were given a sedative during your procedure.  Wear a medical alert bracelet in case of an emergency. This will tell any health care providers that you have a port.  Keep all follow-up visits as told by your health care provider. This is important. Contact a health care provider if:  You cannot flush your port with saline as directed, or you cannot draw blood from the port.  You have a fever or chills.  You have redness, swelling, or pain around your port insertion site.  You have fluid or blood coming from your port insertion site.  Your port insertion site feels warm to the touch.  You have pus or a bad smell coming from the port insertion site. Get help right away if:  You have chest pain or  shortness of breath.  You have bleeding from your port that you cannot control. Summary  Take care of the port as told by your health care provider. Keep the manufacturer's information card with you at all times.  Change your dressing as told by your health care provider.  Contact a health care provider if you have a fever or chills or if you have redness, swelling, or pain around your port insertion site.  Keep all follow-up visits as told by your health care provider. This information is not intended to replace advice given to you by your health care provider. Make sure you discuss any questions you have with your  health care provider. Document Released: 09/04/2013 Document Revised: 06/12/2018 Document Reviewed: 06/12/2018 Elsevier Patient Education  [place].    Moderate Conscious Sedation, Adult, Care After These instructions provide you with information about caring for yourself after your procedure. Your health care provider may also give you more specific instructions. Your treatment has been planned according to current medical practices, but problems sometimes occur. Call your health care provider if you have any problems or questions after your procedure. What can I expect after the procedure? After your procedure, it is common:  To feel sleepy for several hours.  To feel clumsy and have poor balance for several hours.  To have poor judgment for several hours.  To vomit if you eat too soon. Follow these instructions at home: For at least 24 hours after the procedure:   Do not: ? Participate in activities where you could fall or become injured. ? Drive. ? Use heavy machinery. ? Drink alcohol. ? Take sleeping pills or medicines that cause drowsiness. ? Make important decisions or sign legal documents. ? Take care of children on your own.  Rest. Eating and drinking  Follow the diet recommended by your health care provider.  If you vomit: ? Drink water, juice, or soup when you can drink without vomiting. ? Make sure you have little or no nausea before eating solid foods. General instructions  Have a responsible adult stay with you until you are awake and alert.  Take over-the-counter and prescription medicines only as told by your health care provider.  If you smoke, do not smoke without supervision.  Keep all follow-up visits as told by your health care provider. This is important. Contact a health care provider if:  You keep feeling nauseous or you keep vomiting.  You feel light-headed.  You develop a rash.  You have a fever. Get help right away  if:  You have trouble breathing. This information is not intended to replace advice given to you by your health care provider. Make sure you discuss any questions you have with your health care provider. Document Released: 09/04/2013 Document Revised: 10/27/2017 Document Reviewed: 03/05/2016 Elsevier Patient Education  2020 [employer].

## 2019-08-26 NOTE — Procedures (Signed)
Lung ca  S/p RT IJ POWER PORT  TIP SVCRA NO COMP STABLE EBL MIN READY FOR USE FULL REPORT IN PACS

## 2019-08-26 NOTE — H&P (Signed)
Referring Physician(s): Feng,Yan  Supervising Physician: Daryll Brod  Patient Status:  [facility] OP  Chief Complaint: "I'm getting aport a cath"  Subjective: Pt familiar to IR service from right liver mass biopsy on 06/04/19. Patient needs a port placement today for chemotherapy due to metastatic small cell  lung cancer. Patient denies fever, chills, vomiting, diarrhea, SOB, CP, abdominal/back pain, bleeding. Patient reports left hand swelling from IV stick since chemo treatment last week.   Past Medical History:  Diagnosis Date  . Cancer ([place])   . GERD (gastroesophageal reflux disease)   . Hyperlipemia   . Hypertension    Past Surgical History:  Procedure Laterality Date  . arm fracture surgery Right   . colon polyps removed    . ESOPHAGOGASTRODUODENOSCOPY (EGD) WITH PROPOFOL N/A 06/14/2018   Procedure: ESOPHAGOGASTRODUODENOSCOPY (EGD) WITH PROPOFOL;  Surgeon: [name], MD;  Location: [facility] ENDOSCOPY;  Service: Endoscopy;  Laterality: N/A;  . EUS N/A 06/14/2018   Procedure: ESOPHAGEAL ENDOSCOPIC ULTRASOUND (EUS) RADIAL;  Surgeon: [name], MD;  Location: [facility] ENDOSCOPY;  Service: Endoscopy;  Laterality: N/A;  . FINE NEEDLE ASPIRATION N/A 06/14/2018   Procedure: FINE NEEDLE ASPIRATION (FNA) LINEAR;  Surgeon: [name], MD;  Location: [facility] ENDOSCOPY;  Service: Endoscopy;  Laterality: N/A;  . TONSILLECTOMY        Allergies: Patient has no known allergies.  Medications: Prior to Admission medications   Medication Sig Start Date End Date Taking? Authorizing Provider  allopurinol (ZYLOPRIM) 300 MG tablet TAKE 1 TABLET BY MOUTH TWICE A DAY 07/01/19  Yes Truitt Merle, MD  amLODipine (NORVASC) 10 MG tablet Take 1 tablet (10 mg total) by mouth daily. 05/24/19  Yes [name], FNP  aspirin-sod bicarb-citric acid (ALKA-SELTZER) 325 MG TBEF tablet Take 650 mg by mouth every 6 (six) hours as needed.   Yes [provider]  nicotine (NICODERM CQ - DOSED IN  MG/24 HOURS) 14 mg/24hr patch Place 1 patch (14 mg total) onto the skin daily. 06/06/19  Yes Truitt Merle, MD  Omega-3 Fatty Acids (FISH OIL) 1000 MG CAPS Take 1 capsule by mouth daily.   Yes [provider]  ondansetron (ZOFRAN) 8 MG tablet Take 1 tablet (8 mg total) by mouth 2 (two) times daily as needed for refractory nausea / vomiting. Start on day 3 after carboplatin chemo. 06/06/19  Yes Truitt Merle, MD  pantoprazole (PROTONIX) 20 MG tablet Take 1 tablet (20 mg total) by mouth daily. 08/08/19  Yes Alla Feeling, NP  prochlorperazine (COMPAZINE) 10 MG tablet Take 1 tablet (10 mg total) by mouth every 6 (six) hours as needed (Nausea or vomiting). 06/06/19  Yes Truitt Merle, MD  traMADol (ULTRAM) 50 MG tablet Take 1 tablet (50 mg total) by mouth every 12 (twelve) hours as needed. 07/23/19  Yes Alla Feeling, NP     Vital Signs: BP (!) 155/80 (BP Location: Left Arm)   Pulse (!) 109   Temp 99.6 F (37.6 C) (Oral)   Resp 18   SpO2 99%   Awake/alert Cardio: RRR, no lower extremity edema Lung: clear to auscultation bilaterally   Imaging: No results found.  Labs:  CBC: Recent Labs    07/01/19 0939 07/23/19 1045 07/30/19 0802 08/12/19 0818  WBC 14.1* 13.6* 35.8* 10.2  HGB 12.1* 9.7* 9.7* 11.3*  HCT 35.6* 30.0* 29.0* 34.3*  PLT 367 373 231 257    COAGS: Recent Labs    06/04/19 1156  INR 1.0    BMP:  Recent Labs    07/23/19 1045 07/25/19 0807 07/30/19 0802 08/12/19 0818  NA 143 140 142 145  K 4.4 4.2 4.8 3.9  CL 110 106 108 109  CO2 25 23 23 22   GLUCOSE 112* 99 125* 104*  BUN 12 23 13 10   CALCIUM 9.2 9.4 9.3 9.2  CREATININE 1.25* 1.33* 1.07 1.01  GFRNONAA 58* 53* >60 >60  GFRAA >60 >60 >60 >60    LIVER FUNCTION TESTS: Recent Labs    07/23/19 1045 07/25/19 0807 07/30/19 0802 08/12/19 0818  BILITOT 0.3 0.3 0.4 0.7  AST 11* 12* 13* 16  ALT 8 10 12 8   ALKPHOS 168* 159* 241* 188*  PROT 7.3 7.6 7.4 7.7  ALBUMIN 3.1* 3.5 3.7 3.8    Assessment and Plan:   Small cell lung cancer  with poor venous access. IV porta catheter placement needed for sufficient chemotherapy.   Risks and benefits of image guided port-a-catheter placement was discussed with the patient including, but not limited to bleeding, infection, pneumothorax, or fibrin sheath development and need for additional procedures.  All of the patient's questions were answered, patient is agreeable to proceed. Consent signed and in chart. Will resume chemotherapy treatment later this week.   Electronically Signed: D. Rowe Robert, PA-C/Chelsea Cyr PA-S 08/26/2019, 1:04 PM   I spent a total of 15 Minutes at the the patient's bedside AND on the patient's hospital floor or unit, greater than 50% of which was counseling/coordinating care for port placement consent

## 2019-08-27 ENCOUNTER — Other Ambulatory Visit: Payer: Self-pay | Admitting: Hematology

## 2019-08-27 IMAGING — CR DG ABDOMEN ACUTE W/ 1V CHEST
3 series · 3 of 3 positions shown · non-contrast
Comparison: CT abdomen and pelvis 06/06/2018

CLINICAL DATA: Abdominal pain and vomiting

EXAM:
DG ABDOMEN ACUTE W/ 1V CHEST

[w chest pa]
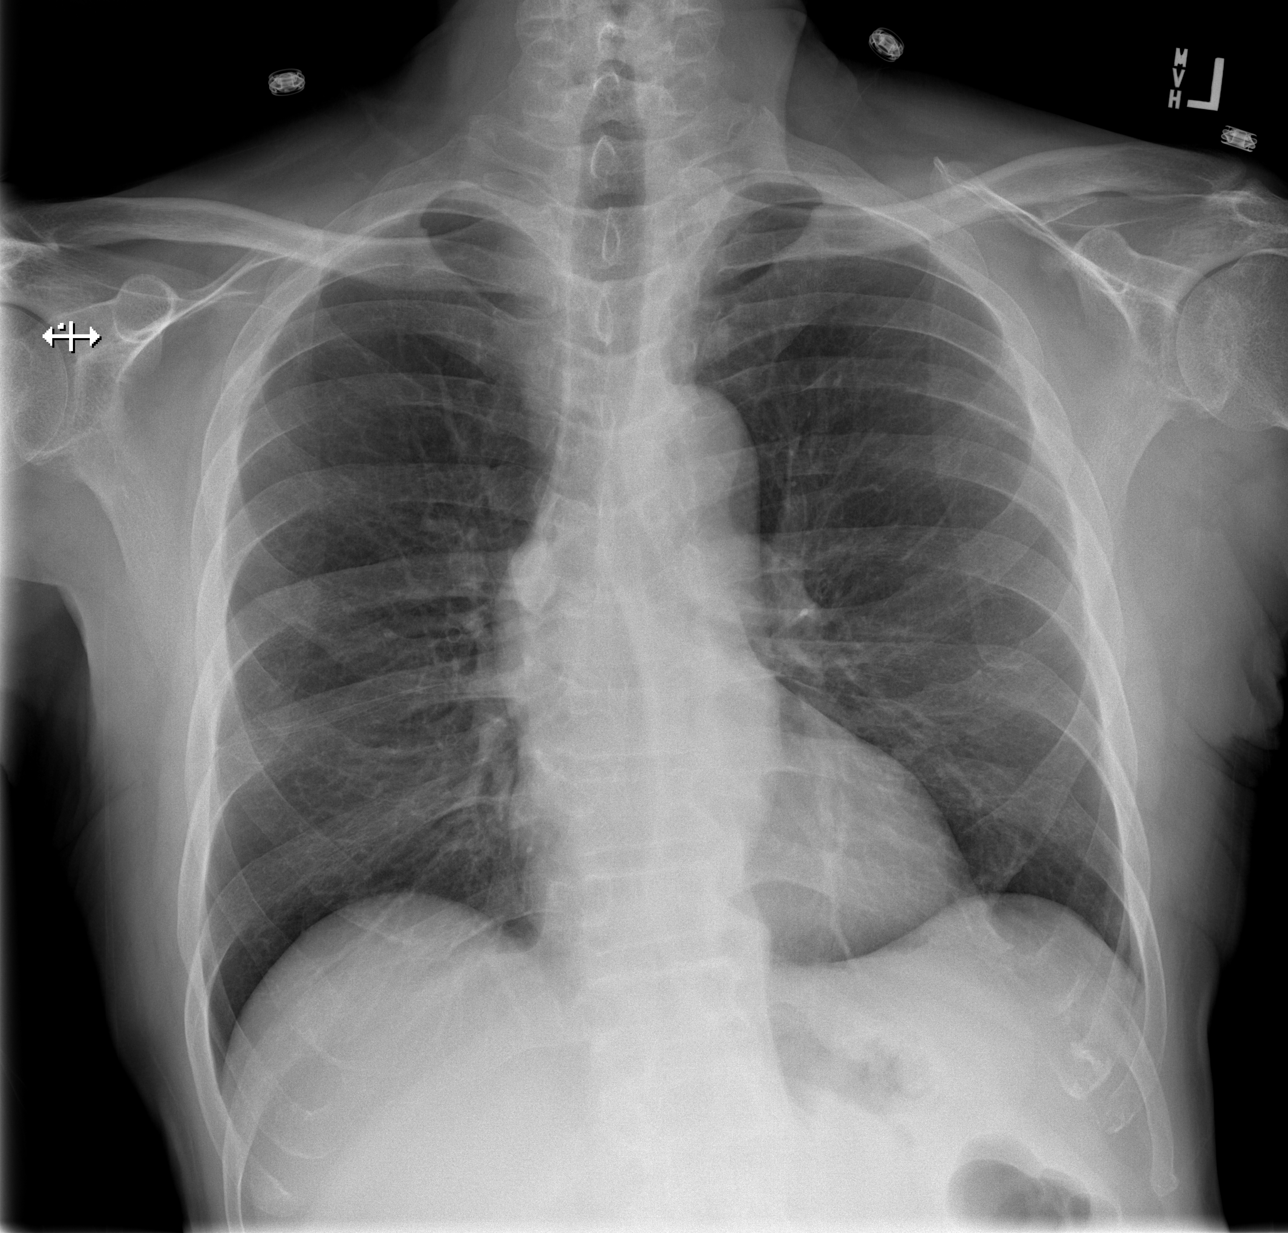

[w abdomen upright]
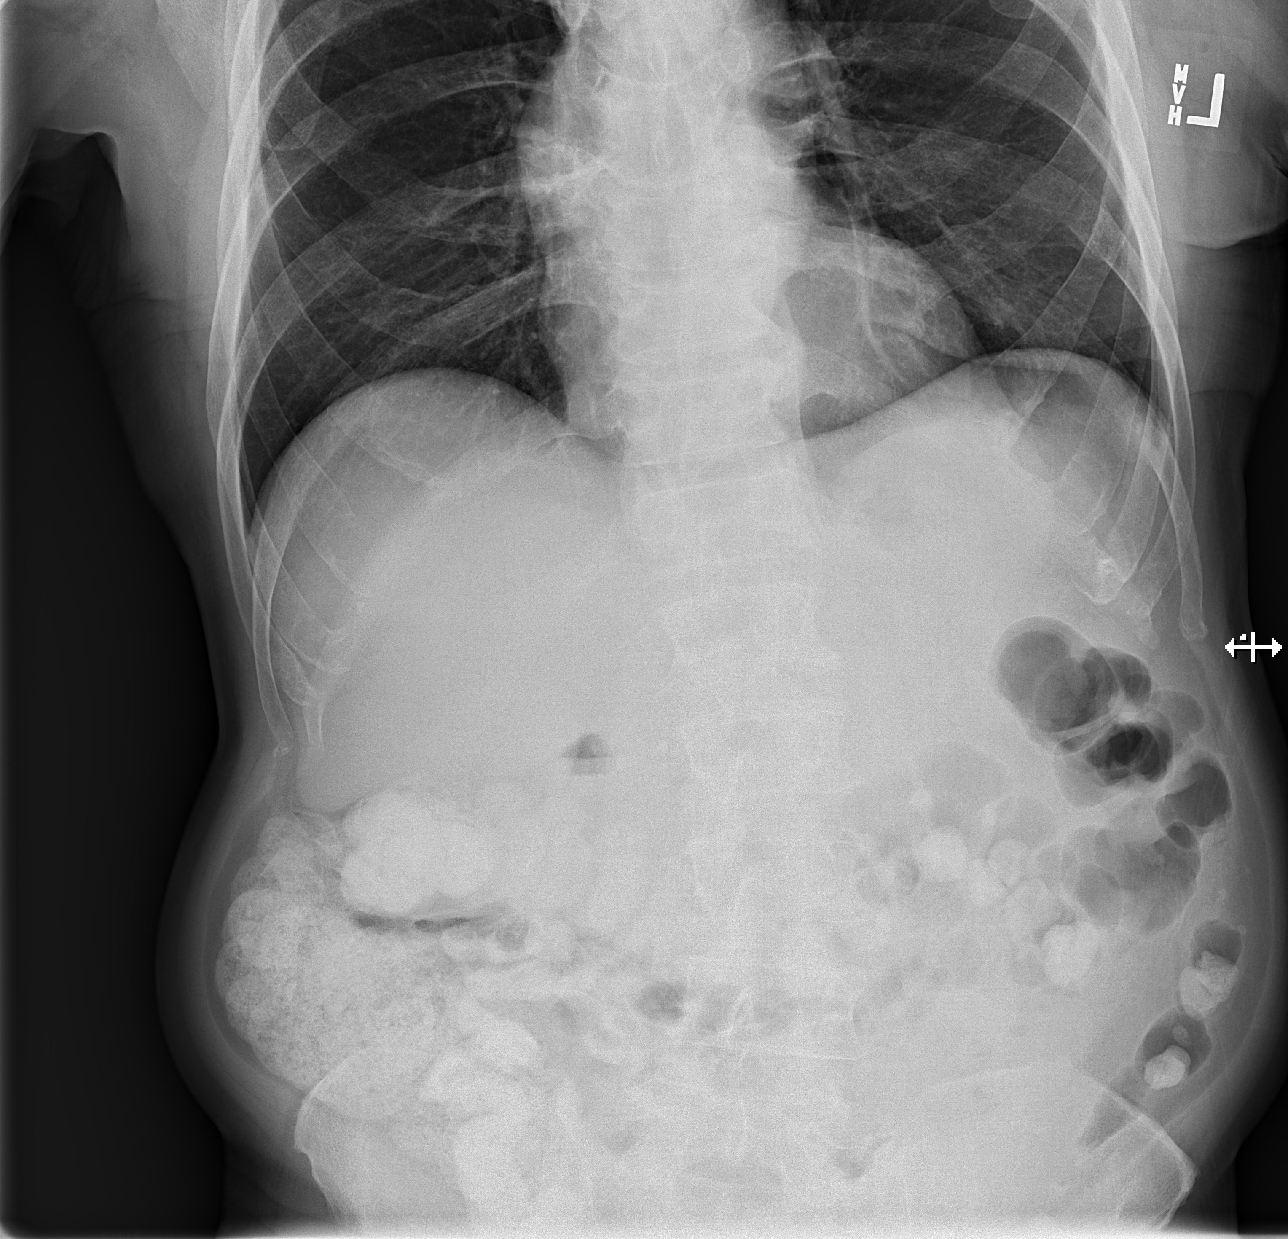

[t abdomen supine]
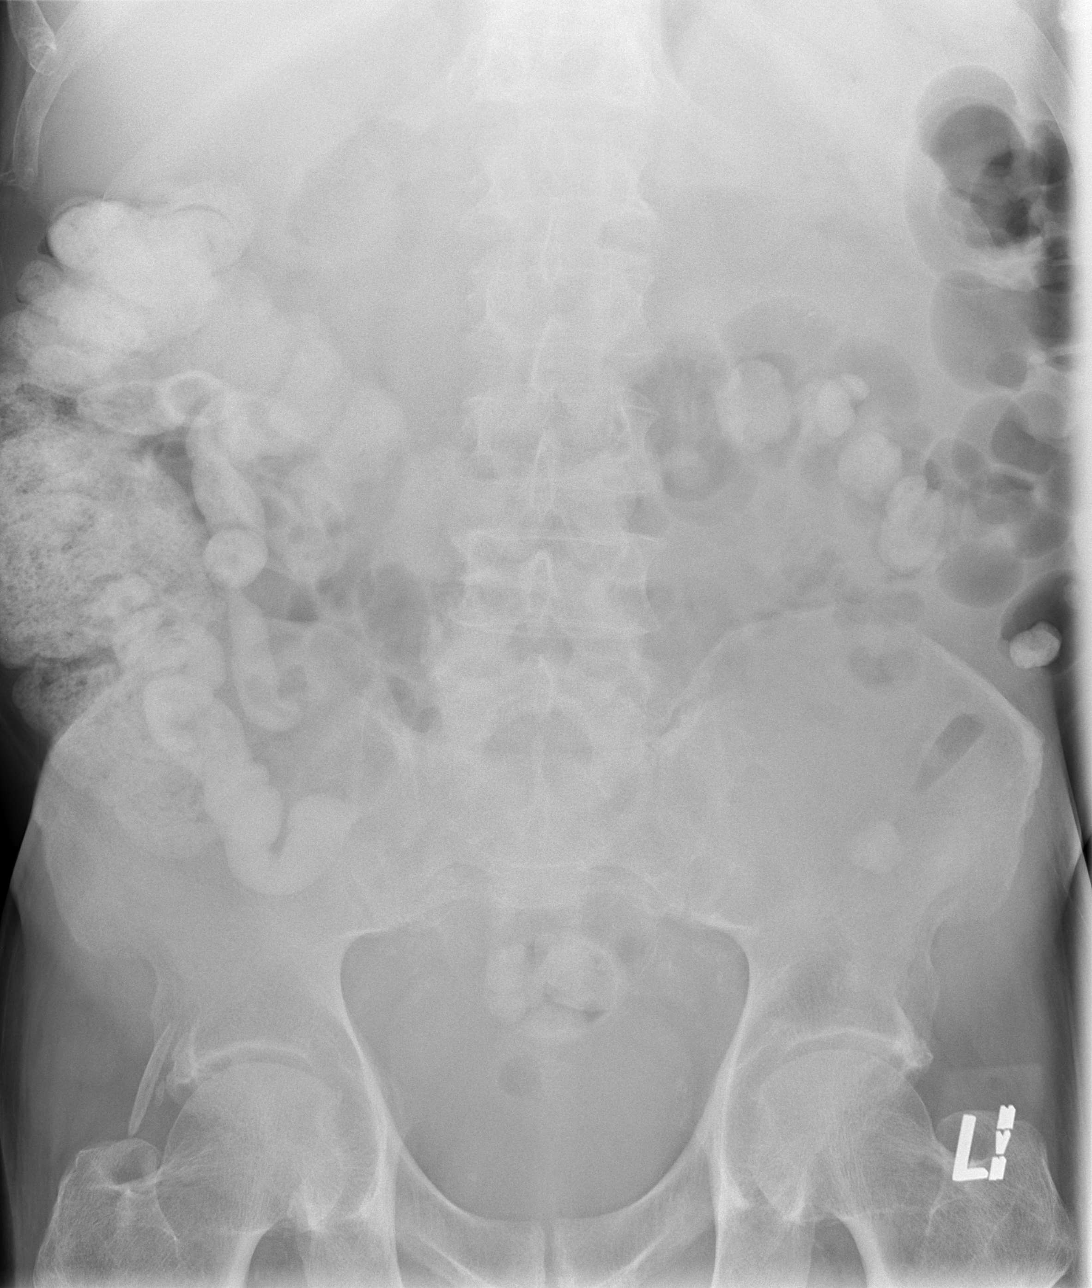

[3 of 3 positions shown; findings below may reference images not displayed]

FINDINGS: Normal heart size, mediastinal contours, and pulmonary vascularity.

Lungs clear.

No pleural effusion or pneumothorax.

Retained contrast in small bowel and colon from recent CT.

No bowel dilatation, bowel wall thickening, or free air.

Bones unremarkable.
IMPRESSION: No acute abnormalities.

## 2019-08-30 ENCOUNTER — Telehealth: Payer: Self-pay | Admitting: Hematology

## 2019-08-30 ENCOUNTER — Other Ambulatory Visit: Payer: Self-pay | Admitting: Hematology

## 2019-08-30 NOTE — Telephone Encounter (Signed)
Cancelled appt per 10/2 sch message - pt aware of appts cancelled

## 2019-09-01 NOTE — Progress Notes (Signed)
[facility]   Telephone:(336) [phone] Fax:(336) [phone]   Clinic Follow up Note   Patient Care Team: [name], FNP as PCP - General (Internal Medicine) Arna Snipe, RN as Oncology Nurse Navigator 09/02/2019  CHIEF COMPLAINT: F/u extensive stage SCLC   SUMMARY OF ONCOLOGIC HISTORY: Oncology History Overview Note  Cancer Staging Small cell lung cancer ([place]) Staging form: Lung, AJCC 8th Edition - Clinical: Stage IVB (cTX, cN2, pM1c) - Signed by Truitt Merle, MD on 06/06/2019    Small cell lung cancer ([place])  07/11/2018 Imaging   CT AP 07/11/18 IMPRESSION: 1. Significantly improved appearance of the pancreas and peripancreatic tissues. Improved to resolved peripancreatic edema with resolved and significantly improved peripancreatic lesions. The remaining lesion was felt to represent abscess on prior endoscopic ultrasound sampling. Pseudocyst or infected pseudocyst would be the favored imaging diagnosis. 2. Persistent but improved mass effect upon the inferior aspect of the portal vein and superior aspect of the superior mesenteric vein. 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 3. Aortic Atherosclerosis (ICD10-I70.0). Bilateral common iliac artery ectasia. 4. A left adrenal nodule is indeterminate on precontrast imaging and may have decreased in size since the prior. Consider dedicated adrenal protocol CT. This could either be performed in 6 months to confirm size stability or more acutely, depending on clinical concern. 5. A splenic lesion is indeterminate and can be re-evaluated on follow-up. 6. Hepatic morphology suspicious for cirrhosis. 7. Cholelithiasis. 8.  Emphysema (ICD10-J43.9).   05/01/2019 Imaging   CT AP 05/01/19  IMPRESSION: 1. New diffuse liver metastases. 2. New mild retroperitoneal lymphadenopathy, consistent with metastatic disease. 3. Resolution of previously seen pancreatic mass since previous study. No  radiographic evidence of acute pancreatitis. 4. Stable left adrenal mass, consistent with adrenal adenoma. 5. Stable markedly enlarged prostate and findings of chronic bladder outlet obstruction. 6. Colonic diverticulosis. No radiographic evidence of diverticulitis. 7. Cholelithiasis.   Aortic Atherosclerosis (ICD10-I70.0).   05/28/2019 PET scan   PET 05/28/19  IMPRESSION: 1. Examination is positive for widespread FDG avid liver metastases. Liver lesions are too numerous to count. 2. Hypermetabolic mediastinal and upper abdominal nodal metastases. 3. Left upper lobe and right upper lobe hypermetabolic pulmonary nodules consistent with pulmonary metastasis. 4. Innumerable liver metastases noted within the axial and proximal appendicular skeleton. Bone lesions are too numerous to count. Some of these have a corresponding lytic lesion on CT images. Others appear occult on the corresponding CT images. 5. Solid hyperdense left adrenal nodule is not significantly changed from 05/01/2019 and exhibits mild FDG uptake. Indeterminate. 6. Prostate gland enlargement. Focal area of increased uptake within the left posterior gland is indeterminate.   06/04/2019 Initial Biopsy   Biopsy 06/04/19  Diagnosis Liver, needle/core biopsy - SMALL CELL CARCINOMA. SEE COMMENT.   06/06/2019 Initial Diagnosis   Small cell lung cancer ([place])   06/06/2019 Cancer Staging   Staging form: Lung, AJCC 8th Edition - Clinical: Stage IVB (cTX, cN2, pM1c) - Signed by Truitt Merle, MD on 06/06/2019   06/10/2019 -  Chemotherapy   Carboplatin Day 1 and Etopside day 1-3 with immunotherapy atezolizumab (Tecentriq) on day 1, every 3 weeks for 6 cycles starting 06/10/19. Plan to continue maintenance Tecentriq afterward.    06/14/2019 Imaging    MRI brain 06/14/19  IMPRESSION: No brain metastases. Chronic small-vessel ischemic changes of the pons and cerebral hemispheric white matter.   Probable metastatic lesions within the upper  cervical spine as seen. No sign of spinal canal encroachment.   07/30/2019 Imaging  CT CAP W Contrast  IMPRESSION: 1. Marked interval decrease in size of the hypermetabolic bilateral pulmonary nodules seen on previous PET-CT. 2. Clear interval decrease in the numerous hepatic metastases. 3. Interval resolution of mediastinal and upper abdominal lymphadenopathy seen previously. 4. Stable left adrenal nodule. This shows low level FDG uptake on prior PET-CT and is indeterminate. Continued attention on follow-up recommended. 5. No new or progressive findings to suggest disease progression. 6. Stable hypoenhancing splenic lesion. Attention on follow-up recommended. 7. Prostatomegaly. 8.  Aortic Atherosclerois (ICD10-170.0)       CURRENT THERAPY:  First lineCarboplatin Day 1 and Etopside day 1-3 with immunotherapyatezolizumab(Tecentriq) on day 1, every 3 weeks starting 06/10/19; s/p 4 cycles; beginning maintenance tecentriq on 09/02/19.  INTERVAL HISTORY: Jeremy Mack returns for f/u and treatment as scheduled. He completed cycle 4 Carbo/etoposide and tecentriq on 9/14 - 9/16 and GCSF on 08/15/19. He feels well today. Has transient fatigue and weakness for a few hours after chemo then resolves. Able to eat and drink well. Denies mucositis. Takes anti-emetics prophylactically, no n/v/c/d. Denies bleeding. No change to left first finger tingling that was present before chemo, denies other neuropathy. His left foot swells with prolonged standing, which has been ongoing for more than 20 years. Denies calf pain in either leg. Denies cough, chest pain, dyspnea. No recent fever or chills. Neck pain resolved, denies new pain. PAC placement went well, was mildly sore but not now.    MEDICAL HISTORY:  Past Medical History:  Diagnosis Date  . Cancer ([place])   . GERD (gastroesophageal reflux disease)   . Hyperlipemia   . Hypertension     SURGICAL HISTORY: Past Surgical History:  Procedure Laterality  Date  . arm fracture surgery Right   . colon polyps removed    . ESOPHAGOGASTRODUODENOSCOPY (EGD) WITH PROPOFOL N/A 06/14/2018   Procedure: ESOPHAGOGASTRODUODENOSCOPY (EGD) WITH PROPOFOL;  Surgeon: [name], MD;  Location: [facility];  Service: Endoscopy;  Laterality: N/A;  . EUS N/A 06/14/2018   Procedure: ESOPHAGEAL ENDOSCOPIC ULTRASOUND (EUS) RADIAL;  Surgeon: [name], MD;  Location: [facility];  Service: Endoscopy;  Laterality: N/A;  . FINE NEEDLE ASPIRATION N/A 06/14/2018   Procedure: FINE NEEDLE ASPIRATION (FNA) LINEAR;  Surgeon: [name], MD;  Location: [facility]OS[facility];  Service: Endoscopy;  Laterality: N/A;  . IR IMAGING GUIDED PORT INSERTION  08/26/2019  . TONSILLECTOMY      I have reviewed the social history and family history with the patient and they are unchanged from previous note.  ALLERGIES:  has No Known Allergies.  MEDICATIONS:  Current Outpatient Medications  Medication Sig Dispense Refill  . allopurinol (ZYLOPRIM) 300 MG tablet TAKE 1 TABLET BY MOUTH TWICE A DAY 60 tablet 0  . amLODipine (NORVASC) 10 MG tablet Take 1 tablet (10 mg total) by mouth daily. 90 tablet 0  . aspirin-sod bicarb-citric acid (ALKA-SELTZER) 325 MG TBEF tablet Take 650 mg by mouth every 6 (six) hours as needed.    . nicotine (NICODERM CQ - DOSED IN MG/24 HOURS) 14 mg/24hr patch Place 1 patch (14 mg total) onto the skin daily. 28 patch 0  . Omega-3 Fatty Acids (FISH OIL) 1000 MG CAPS Take 1 capsule by mouth daily.    . ondansetron (ZOFRAN) 8 MG tablet Take 1 tablet (8 mg total) by mouth 2 (two) times daily as needed for refractory nausea / vomiting. Start on day 3 after carboplatin chemo. 30 tablet 1  . pantoprazole (PROTONIX) 20 MG tablet Take 1  tablet (20 mg total) by mouth daily. 30 tablet 2  . prochlorperazine (COMPAZINE) 10 MG tablet Take 1 tablet (10 mg total) by mouth every 6 (six) hours as needed (Nausea or vomiting). 30 tablet 1  . traMADol (ULTRAM) 50 MG tablet Take  1 tablet (50 mg total) by mouth every 12 (twelve) hours as needed. 30 tablet 0  . lidocaine-prilocaine (EMLA) cream Apply 1 application topically as needed. 30 g 3   No current facility-administered medications for this visit.    Facility-Administered Medications Ordered in Other Visits  Medication Dose Route Frequency Provider Last Rate Last Dose  . sodium chloride flush (NS) 0.9 % injection 10 mL  10 mL Intracatheter PRN Truitt Merle, MD   10 mL at 09/02/19 1053    PHYSICAL EXAMINATION: ECOG PERFORMANCE STATUS: 0 - Asymptomatic  Vitals:   09/02/19 0814  BP: 140/78  Pulse: 99  Resp: 16  Temp: 98.9 F (37.2 C)  SpO2: 100%   Filed Weights   09/02/19 0814  Weight: 170 lb 3.2 oz (77.2 kg)    GENERAL:alert, no distress and comfortable SKIN: no rash EYES: sclera clear OROPHARYNX: no thrush or ulcers LUNGS: clear with normal breathing effort HEART: regular rate & rhythm, no lower extremity edema ABDOMEN: abdomen soft, non-tender and normal bowel sounds Musculoskeletal:no cyanosis of digits NEURO: alert & oriented x 3 with fluent speech, normal gait PAC healing well, no erythema or swelling  LABORATORY DATA:  I have reviewed the data as listed CBC Latest Ref Rng & Units 09/02/2019 08/26/2019 08/12/2019  WBC 4.0 - 10.5 K/uL 10.8(H) 7.7 10.2  Hemoglobin 13.0 - 17.0 g/dL 9.6(L) 9.5(L) 11.3(L)  Hematocrit 39.0 - 52.0 % 29.1(L) 30.3(L) 34.3(L)  Platelets 150 - 400 K/uL 269 86(L) 257     CMP Latest Ref Rng & Units 09/02/2019 08/26/2019 08/12/2019  Glucose 70 - 99 mg/dL 118(H) 128(H) 104(H)  BUN 8 - 23 mg/dL 7(L) 9 10  Creatinine 0.61 - 1.24 mg/dL 0.96 0.99 1.01  Sodium 135 - 145 mmol/L 146(H) 142 145  Potassium 3.5 - 5.1 mmol/L 3.9 3.6 3.9  Chloride 98 - 111 mmol/L 113(H) 107 109  CO2 22 - 32 mmol/L 22 21(L) 22  Calcium 8.9 - 10.3 mg/dL 8.7(L) 9.1 9.2  Total Protein 6.5 - 8.1 g/dL 6.9 - 7.7  Total Bilirubin 0.3 - 1.2 mg/dL 0.3 - 0.7  Alkaline Phos 38 - 126 U/L 146(H) - 188(H)   AST 15 - 41 U/L 12(L) - 16  ALT 0 - 44 U/L 7 - 8      RADIOGRAPHIC STUDIES: I have personally reviewed the radiological images as listed and agreed with the findings in the report. No results found.   ASSESSMENT & PLAN: Jeremy Mack a 73 y.o.malewith   1.Small Cell Lung Cancer,Right lung primary,Metastatic to liver,left lung, LNs andbone -PET scan with diffuse metastatic cancer in liver, upper abdominal nodal mets,bilateral lung lesion, right mediastinal nodes and diffuse bone mets. -Liverbiopsyfrom 06/04/19 which shows metastatic Small Cell lung cancer,TTF(+). This is most consistent with lung primary.Given his mediastinal lymph node distribution, right lung lesion likely the primary tumor. -No brain metastasis as seen on 06/14/19 brain MRI.  -He began systemic first lineCarboplatin on day 1 and Etopside on day 1-3 with immunotherapy atezolizumab(Tecentriq) on day 1 treatment every 3 weeks -goal of treatment is palliative  -For tumor lysis syndrome prevention he is on allopurinol  -He had excellent response to treatment after cycle 3 and completed 4 total cycles. His insurance did  not approve carboplatin and etoposide for cycles 5 and 6.  -plan to proceed with maintenance Tecentriq starting today  2. AKI -06/04/19 Cr at 1.70, previously normal.Possible related to dehydration vs mild tumor lysis -He is currently on Allopurinol with treatment. -Cr normalized  3. H/o pancreatic abscess  -imaging in 05/2018 suspicious for neoplasm, CA 19-9 was 1 -EUS and FNA per Dr. Ardis Hughs showed inflammation, no malignancy -s/p two 3-week courses of Augmentin in 05/2018 for pancreatic abscess, f/u CT showed near resolution of pancreatic abnormality -no pancreas mass on 04/2019 CT -resolved   4. SmokingCessation,H/oalcohol use  -He reports a past history of alcohol use for 10-15 years, he does not drink now. -CT in 05/2018 was concerning for hepatic cirrhosis -has 30 year  smoking history.  -Liver biopsy from 05/2019 shows metastatic small Cell Lung cancer, Likely related to his heavy smoking history.  -nicotine patch is helping him quit   5. Enlarged prostate, elevated PSA  -PSA in 03/2019 9.47, up from 6.8 two years ago -enlarged prostate on CT  -He was previously referred to urology by PCP  6.Goal of care discussion  -The patient understands the goal of care is palliative. -he is full code now  Disposition:  Jeremy Mack appears well. He completed 4 cycles Carboplatin and Etoposide and Tecentriq. He responded very well to treatment per recent restaging CT and continues to tolerate well. His neck pain resolved, no new pain. I refilled Tramadol which he takes sporadically. Labs reviewed, VSS. He will proceed with first cycle of maintenance Tecentriq today. He will return for f/u and Tecentriq in 3 weeks.   All questions were answered. The patient knows to call the clinic with any problems, questions or concerns. No barriers to learning was detected.     Alla Feeling, NP 09/02/19

## 2019-09-02 ENCOUNTER — Encounter: Payer: Self-pay | Admitting: Nurse Practitioner

## 2019-09-02 ENCOUNTER — Inpatient Hospital Stay: Payer: Medicare PPO

## 2019-09-02 ENCOUNTER — Inpatient Hospital Stay: Payer: Medicare PPO | Attending: Nurse Practitioner

## 2019-09-02 ENCOUNTER — Other Ambulatory Visit: Payer: Self-pay

## 2019-09-02 ENCOUNTER — Inpatient Hospital Stay (HOSPITAL_BASED_OUTPATIENT_CLINIC_OR_DEPARTMENT_OTHER): Payer: Medicare PPO | Admitting: Nurse Practitioner

## 2019-09-02 VITALS — BP 140/78 | HR 99 | Temp 98.9°F | Resp 16 | Wt 170.2 lb

## 2019-09-02 DIAGNOSIS — C3491 Malignant neoplasm of unspecified part of right bronchus or lung: Secondary | ICD-10-CM | POA: Diagnosis not present

## 2019-09-02 DIAGNOSIS — Z79899 Other long term (current) drug therapy: Secondary | ICD-10-CM | POA: Diagnosis not present

## 2019-09-02 DIAGNOSIS — Z7982 Long term (current) use of aspirin: Secondary | ICD-10-CM | POA: Diagnosis not present

## 2019-09-02 DIAGNOSIS — E785 Hyperlipidemia, unspecified: Secondary | ICD-10-CM | POA: Diagnosis not present

## 2019-09-02 DIAGNOSIS — G629 Polyneuropathy, unspecified: Secondary | ICD-10-CM | POA: Insufficient documentation

## 2019-09-02 DIAGNOSIS — C7951 Secondary malignant neoplasm of bone: Secondary | ICD-10-CM | POA: Diagnosis not present

## 2019-09-02 DIAGNOSIS — Z5112 Encounter for antineoplastic immunotherapy: Secondary | ICD-10-CM | POA: Diagnosis present

## 2019-09-02 DIAGNOSIS — C7802 Secondary malignant neoplasm of left lung: Secondary | ICD-10-CM | POA: Diagnosis not present

## 2019-09-02 DIAGNOSIS — Z5111 Encounter for antineoplastic chemotherapy: Secondary | ICD-10-CM | POA: Diagnosis present

## 2019-09-02 DIAGNOSIS — N179 Acute kidney failure, unspecified: Secondary | ICD-10-CM | POA: Diagnosis not present

## 2019-09-02 DIAGNOSIS — C772 Secondary and unspecified malignant neoplasm of intra-abdominal lymph nodes: Secondary | ICD-10-CM | POA: Insufficient documentation

## 2019-09-02 DIAGNOSIS — N400 Enlarged prostate without lower urinary tract symptoms: Secondary | ICD-10-CM | POA: Insufficient documentation

## 2019-09-02 DIAGNOSIS — C3490 Malignant neoplasm of unspecified part of unspecified bronchus or lung: Secondary | ICD-10-CM

## 2019-09-02 DIAGNOSIS — K219 Gastro-esophageal reflux disease without esophagitis: Secondary | ICD-10-CM | POA: Diagnosis not present

## 2019-09-02 DIAGNOSIS — I10 Essential (primary) hypertension: Secondary | ICD-10-CM | POA: Insufficient documentation

## 2019-09-02 DIAGNOSIS — Z7189 Other specified counseling: Secondary | ICD-10-CM

## 2019-09-02 DIAGNOSIS — Z5189 Encounter for other specified aftercare: Secondary | ICD-10-CM | POA: Diagnosis present

## 2019-09-02 LAB — CBC WITH DIFFERENTIAL (CANCER CENTER ONLY)
Abs Immature Granulocytes: 0.21 10*3/uL — ABNORMAL HIGH (ref 0.00–0.07)
Basophils Absolute: 0.1 10*3/uL (ref 0.0–0.1)
Basophils Relative: 1 %
Eosinophils Absolute: 0.1 10*3/uL (ref 0.0–0.5)
Eosinophils Relative: 1 %
HCT: 29.1 % — ABNORMAL LOW (ref 39.0–52.0)
Hemoglobin: 9.6 g/dL — ABNORMAL LOW (ref 13.0–17.0)
Immature Granulocytes: 2 %
Lymphocytes Relative: 27 %
Lymphs Abs: 2.9 10*3/uL (ref 0.7–4.0)
MCH: 36.4 pg — ABNORMAL HIGH (ref 26.0–34.0)
MCHC: 33 g/dL (ref 30.0–36.0)
MCV: 110.2 fL — ABNORMAL HIGH (ref 80.0–100.0)
Monocytes Absolute: 1.2 10*3/uL — ABNORMAL HIGH (ref 0.1–1.0)
Monocytes Relative: 11 %
Neutro Abs: 6.4 10*3/uL (ref 1.7–7.7)
Neutrophils Relative %: 58 %
Platelet Count: 269 10*3/uL (ref 150–400)
RBC: 2.64 MIL/uL — ABNORMAL LOW (ref 4.22–5.81)
RDW: 21.3 % — ABNORMAL HIGH (ref 11.5–15.5)
WBC Count: 10.8 10*3/uL — ABNORMAL HIGH (ref 4.0–10.5)
nRBC: 1.9 % — ABNORMAL HIGH (ref 0.0–0.2)

## 2019-09-02 LAB — CMP (CANCER CENTER ONLY)
ALT: 7 U/L (ref 0–44)
AST: 12 U/L — ABNORMAL LOW (ref 15–41)
Albumin: 3.2 g/dL — ABNORMAL LOW (ref 3.5–5.0)
Alkaline Phosphatase: 146 U/L — ABNORMAL HIGH (ref 38–126)
Anion gap: 11 (ref 5–15)
BUN: 7 mg/dL — ABNORMAL LOW (ref 8–23)
CO2: 22 mmol/L (ref 22–32)
Calcium: 8.7 mg/dL — ABNORMAL LOW (ref 8.9–10.3)
Chloride: 113 mmol/L — ABNORMAL HIGH (ref 98–111)
Creatinine: 0.96 mg/dL (ref 0.61–1.24)
GFR, Est AFR Am: >60 mL/min (ref >60)
GFR, Estimated: >60 mL/min (ref >60)
Glucose, Bld: 118 mg/dL — ABNORMAL HIGH (ref 70–99)
Potassium: 3.9 mmol/L (ref 3.5–5.1)
Sodium: 146 mmol/L — ABNORMAL HIGH (ref 135–145)
Total Bilirubin: 0.3 mg/dL (ref 0.3–1.2)
Total Protein: 6.9 g/dL (ref 6.5–8.1)

## 2019-09-02 LAB — TSH: TSH: 1.085 u[IU]/mL (ref 0.320–4.118)

## 2019-09-02 MED ORDER — SODIUM CHLORIDE 0.9% FLUSH
10.0000 mL | INTRAVENOUS | Status: DC | PRN
  Administered 2019-09-02: 10 mL
  Filled 2019-09-02: qty 10

## 2019-09-02 MED ORDER — SODIUM CHLORIDE 0.9 % IV SOLN
1200.0000 mg | Freq: Once | INTRAVENOUS | Status: AC
  Administered 2019-09-02: 10:00:00 1200 mg via INTRAVENOUS
  Filled 2019-09-02: qty 20

## 2019-09-02 MED ORDER — SODIUM CHLORIDE 0.9 % IV SOLN
Freq: Once | INTRAVENOUS | Status: AC
  Administered 2019-09-02: 10:00:00 via INTRAVENOUS
  Filled 2019-09-02: qty 250

## 2019-09-02 MED ORDER — HEPARIN SOD (PORK) LOCK FLUSH 100 UNIT/ML IV SOLN
500.0000 [IU] | Freq: Once | INTRAVENOUS | Status: AC | PRN
  Administered 2019-09-02: 11:00:00 500 [IU]
  Filled 2019-09-02: qty 5

## 2019-09-02 MED ORDER — TRAMADOL HCL 50 MG PO TABS: 50.0000 mg | ORAL_TABLET | Freq: Two times a day (BID) | ORAL | 0 refills | Status: DC | PRN

## 2019-09-02 MED ORDER — LIDOCAINE-PRILOCAINE 2.5-2.5 % EX CREA: 1.0000 "application " | TOPICAL_CREAM | CUTANEOUS | 3 refills | Status: AC | PRN

## 2019-09-02 NOTE — Patient Instructions (Addendum)
[facility] Discharge Instructions for Patients Receiving Chemotherapy  Today you received the following chemotherapy agents :  Tecentriq  To help prevent nausea and vomiting after your treatment, we encourage you to take your nausea medication as prescribed.   If you develop nausea and vomiting that is not controlled by your nausea medication, call the clinic.   BELOW ARE SYMPTOMS THAT SHOULD BE REPORTED IMMEDIATELY:  *FEVER GREATER THAN 100.5 F  *CHILLS WITH OR WITHOUT FEVER  NAUSEA AND VOMITING THAT IS NOT CONTROLLED WITH YOUR NAUSEA MEDICATION  *UNUSUAL SHORTNESS OF BREATH  *UNUSUAL BRUISING OR BLEEDING  TENDERNESS IN MOUTH AND THROAT WITH OR WITHOUT PRESENCE OF ULCERS  *URINARY PROBLEMS  *BOWEL PROBLEMS  UNUSUAL RASH Items with * indicate a potential emergency and should be followed up as soon as possible.  Feel free to call the clinic should you have any questions or concerns. The clinic phone number is [phone].  Please show the [facility] at check-in to the Emergency Department and triage nurse.  Atezolizumab injection(Tecentriq) What is this medicine? ATEZOLIZUMAB (a te zoe LIZ ue mab) is a monoclonal antibody. It is used to treat bladder cancer (urothelial cancer), non-small cell lung cancer, small cell lung cancer, and breast cancer. This medicine may be used for other purposes; ask your health care provider or pharmacist if you have questions. COMMON BRAND NAME(S): Tecentriq What should I tell my health care provider before I take this medicine? They need to know if you have any of these conditions:  diabetes  immune system problems  infection  inflammatory bowel disease  liver disease  lung or breathing disease  lupus  nervous system problems like myasthenia gravis or Guillain-Barre syndrome  organ transplant  an unusual or allergic reaction to atezolizumab, other medicines, foods, dyes, or preservatives  pregnant or  trying to get pregnant  breast-feeding How should I use this medicine? This medicine is for infusion into a vein. It is given by a health care professional in a hospital or clinic setting. A special MedGuide will be given to you before each treatment. Be sure to read this information carefully each time. Talk to your pediatrician regarding the use of this medicine in children. Special care may be needed. Overdosage: If you think you have taken too much of this medicine contact a poison control center or emergency room at once. NOTE: This medicine is only for you. Do not share this medicine with others. What if I miss a dose? It is important not to miss your dose. Call your doctor or health care professional if you are unable to keep an appointment. What may interact with this medicine? Interactions have not been studied. This list may not describe all possible interactions. Give your health care provider a list of all the medicines, herbs, non-prescription drugs, or dietary supplements you use. Also tell them if you smoke, drink alcohol, or use illegal drugs. Some items may interact with your medicine. What should I watch for while using this medicine? Your condition will be monitored carefully while you are receiving this medicine. You may need blood work done while you are taking this medicine. Do not become pregnant while taking this medicine or for at least 5 months after stopping it. Women should inform their doctor if they wish to become pregnant or think they might be pregnant. There is a potential for serious side effects to an unborn child. Talk to your health care professional or pharmacist for more information. Do  not breast-feed an infant while taking this medicine or for at least 5 months after the last dose. What side effects may I notice from receiving this medicine? Side effects that you should report to your doctor or health care professional as soon as possible:  allergic  reactions like skin rash, itching or hives, swelling of the face, lips, or tongue  black, tarry stools  bloody or watery diarrhea  breathing problems  changes in vision  chest pain or chest tightness  chills  facial flushing  fever  headache  signs and symptoms of high blood sugar such as dizziness; dry mouth; dry skin; fruity breath; nausea; stomach pain; increased hunger or thirst; increased urination  signs and symptoms of liver injury like dark yellow or brown urine; general ill feeling or flu-like symptoms; light-colored stools; loss of appetite; nausea; right upper belly pain; unusually weak or tired; yellowing of the eyes or skin  stomach pain  trouble passing urine or change in the amount of urine Side effects that usually do not require medical attention (report to your doctor or health care professional if they continue or are bothersome):  cough  diarrhea  joint pain  muscle pain  muscle weakness  tiredness  weight loss This list may not describe all possible side effects. Call your doctor for medical advice about side effects. You may report side effects to FDA at 1-800-FDA-1088. Where should I keep my medicine? This drug is given in a hospital or clinic and will not be stored at home. NOTE: This sheet is a summary. It may not cover all possible information. If you have questions about this medicine, talk to your doctor, pharmacist, or health care provider.  2020 Elsevier/Gold Standard (2018-02-16 09:33:38)    Implanted [facility] Guide An implanted port is a device that is placed under the skin. It is usually placed in the chest. The device can be used to give IV medicine, to take blood, or for dialysis. You may have an implanted port if:  You need IV medicine that would be irritating to the small veins in your hands or arms.  You need IV medicines, such as antibiotics, for a long period of time.  You need IV nutrition for a long period of  time.  You need dialysis. Having a port means that your health care provider will not need to use the veins in your arms for these procedures. You may have fewer limitations when using a port than you would if you used other types of long-term IVs, and you will likely be able to return to normal activities after your incision heals. An implanted port has two main parts:  Reservoir. The reservoir is the part where a needle is inserted to give medicines or draw blood. The reservoir is round. After it is placed, it appears as a small, raised area under your skin.  Catheter. The catheter is a thin, flexible tube that connects the reservoir to a vein. Medicine that is inserted into the reservoir goes into the catheter and then into the vein. How is my port accessed? To access your port:  A numbing cream may be placed on the skin over the port site.  Your health care provider will put on a mask and sterile gloves.  The skin over your port will be cleaned carefully with a germ-killing soap and allowed to dry.  Your health care provider will gently pinch the port and insert a needle into it.  Your health care provider  will check for a blood return to make sure the port is in the vein and is not clogged.  If your port needs to remain accessed to get medicine continuously (constant infusion), your health care provider will place a clear bandage (dressing) over the needle site. The dressing and needle will need to be changed every week, or as told by your health care provider. What is flushing? Flushing helps keep the port from getting clogged. Follow instructions from your health care provider about how and when to flush the port. Ports are usually flushed with saline solution or a medicine called heparin. The need for flushing will depend on how the port is used:  If the port is only used from time to time to give medicines or draw blood, the port may need to be flushed: ? Before and after  medicines have been given. ? Before and after blood has been drawn. ? As part of routine maintenance. Flushing may be recommended every 4-6 weeks.  If a constant infusion is running, the port may not need to be flushed.  Throw away any syringes in a disposal container that is meant for sharp items (sharps container). You can buy a sharps container from a pharmacy, or you can make one by using an empty hard plastic bottle with a cover. How long will my port stay implanted? The port can stay in for as long as your health care provider thinks it is needed. When it is time for the port to come out, a surgery will be done to remove it. The surgery will be similar to the procedure that was done to put the port in. Follow these instructions at home:   Flush your port as told by your health care provider.  If you need an infusion over several days, follow instructions from your health care provider about how to take care of your port site. Make sure you: ? Wash your hands with soap and water before you change your dressing. If soap and water are not available, use alcohol-based hand sanitizer. ? Change your dressing as told by your health care provider. ? Place any used dressings or infusion bags into a plastic bag. Throw that bag in the trash. ? Keep the dressing that covers the needle clean and dry. Do not get it wet. ? Do not use scissors or sharp objects near the tube. ? Keep the tube clamped, unless it is being used.  Check your port site every day for signs of infection. Check for: ? Redness, swelling, or pain. ? Fluid or blood. ? Pus or a bad smell.  Protect the skin around the port site. ? Avoid wearing bra straps that rub or irritate the site. ? Protect the skin around your port from seat belts. Place a soft pad over your chest if needed.  Bathe or shower as told by your health care provider. The site may get wet as long as you are not actively receiving an infusion.  Return to your  normal activities as told by your health care provider. Ask your health care provider what activities are safe for you.  Carry a medical alert card or wear a medical alert bracelet at all times. This will let health care providers know that you have an implanted port in case of an emergency. Get help right away if:  You have redness, swelling, or pain at the port site.  You have fluid or blood coming from your port site.  You have pus  or a bad smell coming from the port site.  You have a fever. Summary  Implanted ports are usually placed in the chest for long-term IV access.  Follow instructions from your health care provider about flushing the port and changing bandages (dressings).  Take care of the area around your port by avoiding clothing that puts pressure on the area, and by watching for signs of infection.  Protect the skin around your port from seat belts. Place a soft pad over your chest if needed.  Get help right away if you have a fever or you have redness, swelling, pain, drainage, or a bad smell at the port site. This information is not intended to replace advice given to you by your health care provider. Make sure you discuss any questions you have with your health care provider. Document Released: 11/14/2005 Document Revised: 03/08/2019 Document Reviewed: 12/17/2016 Elsevier Patient Education  2020 [employer].

## 2019-09-02 NOTE — Progress Notes (Unsigned)
e

## 2019-09-03 ENCOUNTER — Ambulatory Visit: Payer: Medicare PPO

## 2019-09-03 ENCOUNTER — Telehealth: Payer: Self-pay | Admitting: Nurse Practitioner

## 2019-09-03 NOTE — Telephone Encounter (Signed)
Scheduled appt per 10/5 los.  Spoke with patient and he is aware of the appt date and time.

## 2019-09-04 ENCOUNTER — Ambulatory Visit: Payer: Medicare PPO

## 2019-09-05 ENCOUNTER — Ambulatory Visit: Payer: Medicare PPO

## 2019-09-05 ENCOUNTER — Other Ambulatory Visit: Payer: Self-pay | Admitting: Nurse Practitioner

## 2019-09-19 ENCOUNTER — Other Ambulatory Visit: Payer: Self-pay | Admitting: Hematology

## 2019-09-24 ENCOUNTER — Encounter: Payer: Self-pay | Admitting: Nurse Practitioner

## 2019-09-24 ENCOUNTER — Inpatient Hospital Stay: Payer: Medicare PPO

## 2019-09-24 ENCOUNTER — Inpatient Hospital Stay (HOSPITAL_BASED_OUTPATIENT_CLINIC_OR_DEPARTMENT_OTHER): Payer: Medicare PPO | Admitting: Nurse Practitioner

## 2019-09-24 ENCOUNTER — Other Ambulatory Visit: Payer: Self-pay

## 2019-09-24 ENCOUNTER — Telehealth: Payer: Self-pay | Admitting: Radiation Oncology

## 2019-09-24 ENCOUNTER — Telehealth: Payer: Self-pay | Admitting: Nurse Practitioner

## 2019-09-24 VITALS — BP 146/83 | HR 79 | Temp 98.3°F | Resp 18 | Ht 71.0 in | Wt 173.0 lb

## 2019-09-24 DIAGNOSIS — C3490 Malignant neoplasm of unspecified part of unspecified bronchus or lung: Secondary | ICD-10-CM | POA: Diagnosis not present

## 2019-09-24 DIAGNOSIS — Z7189 Other specified counseling: Secondary | ICD-10-CM

## 2019-09-24 DIAGNOSIS — Z5112 Encounter for antineoplastic immunotherapy: Secondary | ICD-10-CM | POA: Diagnosis not present

## 2019-09-24 DIAGNOSIS — Z95828 Presence of other vascular implants and grafts: Secondary | ICD-10-CM

## 2019-09-24 LAB — CBC WITH DIFFERENTIAL (CANCER CENTER ONLY)
Abs Immature Granulocytes: 0.01 10*3/uL (ref 0.00–0.07)
Basophils Absolute: 0.1 10*3/uL (ref 0.0–0.1)
Basophils Relative: 1 %
Eosinophils Absolute: 0.2 10*3/uL (ref 0.0–0.5)
Eosinophils Relative: 3 %
HCT: 36.8 % — ABNORMAL LOW (ref 39.0–52.0)
Hemoglobin: 12 g/dL — ABNORMAL LOW (ref 13.0–17.0)
Immature Granulocytes: 0 %
Lymphocytes Relative: 27 %
Lymphs Abs: 1.6 10*3/uL (ref 0.7–4.0)
MCH: 34.5 pg — ABNORMAL HIGH (ref 26.0–34.0)
MCHC: 32.6 g/dL (ref 30.0–36.0)
MCV: 105.7 fL — ABNORMAL HIGH (ref 80.0–100.0)
Monocytes Absolute: 0.7 10*3/uL (ref 0.1–1.0)
Monocytes Relative: 12 %
Neutro Abs: 3.3 10*3/uL (ref 1.7–7.7)
Neutrophils Relative %: 57 %
Platelet Count: 216 10*3/uL (ref 150–400)
RBC: 3.48 MIL/uL — ABNORMAL LOW (ref 4.22–5.81)
RDW: 15.5 % (ref 11.5–15.5)
WBC Count: 5.9 10*3/uL (ref 4.0–10.5)
nRBC: 0 % (ref 0.0–0.2)

## 2019-09-24 LAB — CMP (CANCER CENTER ONLY)
ALT: 9 U/L (ref 0–44)
AST: 15 U/L (ref 15–41)
Albumin: 3.5 g/dL (ref 3.5–5.0)
Alkaline Phosphatase: 133 U/L — ABNORMAL HIGH (ref 38–126)
Anion gap: 9 (ref 5–15)
BUN: 13 mg/dL (ref 8–23)
CO2: 24 mmol/L (ref 22–32)
Calcium: 9.2 mg/dL (ref 8.9–10.3)
Chloride: 109 mmol/L (ref 98–111)
Creatinine: 1.17 mg/dL (ref 0.61–1.24)
GFR, Est AFR Am: >60 mL/min (ref >60)
GFR, Estimated: >60 mL/min (ref >60)
Glucose, Bld: 114 mg/dL — ABNORMAL HIGH (ref 70–99)
Potassium: 4.2 mmol/L (ref 3.5–5.1)
Sodium: 142 mmol/L (ref 135–145)
Total Bilirubin: 0.4 mg/dL (ref 0.3–1.2)
Total Protein: 7.3 g/dL (ref 6.5–8.1)

## 2019-09-24 LAB — TSH: TSH: 1.189 u[IU]/mL (ref 0.320–4.118)

## 2019-09-24 MED ORDER — SODIUM CHLORIDE 0.9 % IV SOLN
Freq: Once | INTRAVENOUS | Status: AC
  Administered 2019-09-24: 11:00:00 via INTRAVENOUS
  Filled 2019-09-24: qty 250

## 2019-09-24 MED ORDER — HEPARIN SOD (PORK) LOCK FLUSH 100 UNIT/ML IV SOLN
500.0000 [IU] | Freq: Once | INTRAVENOUS | Status: AC | PRN
  Administered 2019-09-24: 500 [IU]
  Filled 2019-09-24: qty 5

## 2019-09-24 MED ORDER — SODIUM CHLORIDE 0.9% FLUSH
10.0000 mL | INTRAVENOUS | Status: DC | PRN
  Administered 2019-09-24: 10 mL
  Filled 2019-09-24: qty 10

## 2019-09-24 MED ORDER — SODIUM CHLORIDE 0.9 % IV SOLN
1200.0000 mg | Freq: Once | INTRAVENOUS | Status: AC
  Administered 2019-09-24: 1200 mg via INTRAVENOUS
  Filled 2019-09-24: qty 20

## 2019-09-24 MED ORDER — SODIUM CHLORIDE 0.9% FLUSH
10.0000 mL | INTRAVENOUS | Status: DC | PRN
  Administered 2019-09-24: 09:00:00 10 mL via INTRAVENOUS
  Filled 2019-09-24: qty 10

## 2019-09-24 NOTE — Patient Instructions (Signed)
[facility] Discharge Instructions for Patients Receiving Chemotherapy  Today you received the following chemotherapy agents :  Tecentriq  To help prevent nausea and vomiting after your treatment, we encourage you to take your nausea medication as prescribed.   If you develop nausea and vomiting that is not controlled by your nausea medication, call the clinic.   BELOW ARE SYMPTOMS THAT SHOULD BE REPORTED IMMEDIATELY:  *FEVER GREATER THAN 100.5 F  *CHILLS WITH OR WITHOUT FEVER  NAUSEA AND VOMITING THAT IS NOT CONTROLLED WITH YOUR NAUSEA MEDICATION  *UNUSUAL SHORTNESS OF BREATH  *UNUSUAL BRUISING OR BLEEDING  TENDERNESS IN MOUTH AND THROAT WITH OR WITHOUT PRESENCE OF ULCERS  *URINARY PROBLEMS  *BOWEL PROBLEMS  UNUSUAL RASH Items with * indicate a potential emergency and should be followed up as soon as possible.  Feel free to call the clinic should you have any questions or concerns. The clinic phone number is [phone].  Please show the [facility] at check-in to the Emergency Department and triage nurse.  Atezolizumab injection(Tecentriq) What is this medicine? ATEZOLIZUMAB (a te zoe LIZ ue mab) is a monoclonal antibody. It is used to treat bladder cancer (urothelial cancer), non-small cell lung cancer, small cell lung cancer, and breast cancer. This medicine may be used for other purposes; ask your health care provider or pharmacist if you have questions. COMMON BRAND NAME(S): Tecentriq What should I tell my health care provider before I take this medicine? They need to know if you have any of these conditions:  diabetes  immune system problems  infection  inflammatory bowel disease  liver disease  lung or breathing disease  lupus  nervous system problems like myasthenia gravis or Guillain-Barre syndrome  organ transplant  an unusual or allergic reaction to atezolizumab, other medicines, foods, dyes, or preservatives  pregnant or  trying to get pregnant  breast-feeding How should I use this medicine? This medicine is for infusion into a vein. It is given by a health care professional in a hospital or clinic setting. A special MedGuide will be given to you before each treatment. Be sure to read this information carefully each time. Talk to your pediatrician regarding the use of this medicine in children. Special care may be needed. Overdosage: If you think you have taken too much of this medicine contact a poison control center or emergency room at once. NOTE: This medicine is only for you. Do not share this medicine with others. What if I miss a dose? It is important not to miss your dose. Call your doctor or health care professional if you are unable to keep an appointment. What may interact with this medicine? Interactions have not been studied. This list may not describe all possible interactions. Give your health care provider a list of all the medicines, herbs, non-prescription drugs, or dietary supplements you use. Also tell them if you smoke, drink alcohol, or use illegal drugs. Some items may interact with your medicine. What should I watch for while using this medicine? Your condition will be monitored carefully while you are receiving this medicine. You may need blood work done while you are taking this medicine. Do not become pregnant while taking this medicine or for at least 5 months after stopping it. Women should inform their doctor if they wish to become pregnant or think they might be pregnant. There is a potential for serious side effects to an unborn child. Talk to your health care professional or pharmacist for more information. Do  not breast-feed an infant while taking this medicine or for at least 5 months after the last dose. What side effects may I notice from receiving this medicine? Side effects that you should report to your doctor or health care professional as soon as possible:  allergic  reactions like skin rash, itching or hives, swelling of the face, lips, or tongue  black, tarry stools  bloody or watery diarrhea  breathing problems  changes in vision  chest pain or chest tightness  chills  facial flushing  fever  headache  signs and symptoms of high blood sugar such as dizziness; dry mouth; dry skin; fruity breath; nausea; stomach pain; increased hunger or thirst; increased urination  signs and symptoms of liver injury like dark yellow or brown urine; general ill feeling or flu-like symptoms; light-colored stools; loss of appetite; nausea; right upper belly pain; unusually weak or tired; yellowing of the eyes or skin  stomach pain  trouble passing urine or change in the amount of urine Side effects that usually do not require medical attention (report to your doctor or health care professional if they continue or are bothersome):  cough  diarrhea  joint pain  muscle pain  muscle weakness  tiredness  weight loss This list may not describe all possible side effects. Call your doctor for medical advice about side effects. You may report side effects to FDA at 1-800-FDA-1088. Where should I keep my medicine? This drug is given in a hospital or clinic and will not be stored at home. NOTE: This sheet is a summary. It may not cover all possible information. If you have questions about this medicine, talk to your doctor, pharmacist, or health care provider.  2020 Elsevier/Gold Standard (2018-02-16 09:33:38)    Implanted [facility] Guide An implanted port is a device that is placed under the skin. It is usually placed in the chest. The device can be used to give IV medicine, to take blood, or for dialysis. You may have an implanted port if:  You need IV medicine that would be irritating to the small veins in your hands or arms.  You need IV medicines, such as antibiotics, for a long period of time.  You need IV nutrition for a long period of time.   You need dialysis. Having a port means that your health care provider will not need to use the veins in your arms for these procedures. You may have fewer limitations when using a port than you would if you used other types of long-term IVs, and you will likely be able to return to normal activities after your incision heals. An implanted port has two main parts:  Reservoir. The reservoir is the part where a needle is inserted to give medicines or draw blood. The reservoir is round. After it is placed, it appears as a small, raised area under your skin.  Catheter. The catheter is a thin, flexible tube that connects the reservoir to a vein. Medicine that is inserted into the reservoir goes into the catheter and then into the vein. How is my port accessed? To access your port:  A numbing cream may be placed on the skin over the port site.  Your health care provider will put on a mask and sterile gloves.  The skin over your port will be cleaned carefully with a germ-killing soap and allowed to dry.  Your health care provider will gently pinch the port and insert a needle into it.  Your health care provider  will check for a blood return to make sure the port is in the vein and is not clogged.  If your port needs to remain accessed to get medicine continuously (constant infusion), your health care provider will place a clear bandage (dressing) over the needle site. The dressing and needle will need to be changed every week, or as told by your health care provider. What is flushing? Flushing helps keep the port from getting clogged. Follow instructions from your health care provider about how and when to flush the port. Ports are usually flushed with saline solution or a medicine called heparin. The need for flushing will depend on how the port is used:  If the port is only used from time to time to give medicines or draw blood, the port may need to be flushed: ? Before and after medicines have  been given. ? Before and after blood has been drawn. ? As part of routine maintenance. Flushing may be recommended every 4-6 weeks.  If a constant infusion is running, the port may not need to be flushed.  Throw away any syringes in a disposal container that is meant for sharp items (sharps container). You can buy a sharps container from a pharmacy, or you can make one by using an empty hard plastic bottle with a cover. How long will my port stay implanted? The port can stay in for as long as your health care provider thinks it is needed. When it is time for the port to come out, a surgery will be done to remove it. The surgery will be similar to the procedure that was done to put the port in. Follow these instructions at home:   Flush your port as told by your health care provider.  If you need an infusion over several days, follow instructions from your health care provider about how to take care of your port site. Make sure you: ? Wash your hands with soap and water before you change your dressing. If soap and water are not available, use alcohol-based hand sanitizer. ? Change your dressing as told by your health care provider. ? Place any used dressings or infusion bags into a plastic bag. Throw that bag in the trash. ? Keep the dressing that covers the needle clean and dry. Do not get it wet. ? Do not use scissors or sharp objects near the tube. ? Keep the tube clamped, unless it is being used.  Check your port site every day for signs of infection. Check for: ? Redness, swelling, or pain. ? Fluid or blood. ? Pus or a bad smell.  Protect the skin around the port site. ? Avoid wearing bra straps that rub or irritate the site. ? Protect the skin around your port from seat belts. Place a soft pad over your chest if needed.  Bathe or shower as told by your health care provider. The site may get wet as long as you are not actively receiving an infusion.  Return to your normal  activities as told by your health care provider. Ask your health care provider what activities are safe for you.  Carry a medical alert card or wear a medical alert bracelet at all times. This will let health care providers know that you have an implanted port in case of an emergency. Get help right away if:  You have redness, swelling, or pain at the port site.  You have fluid or blood coming from your port site.  You have pus  or a bad smell coming from the port site.  You have a fever. Summary  Implanted ports are usually placed in the chest for long-term IV access.  Follow instructions from your health care provider about flushing the port and changing bandages (dressings).  Take care of the area around your port by avoiding clothing that puts pressure on the area, and by watching for signs of infection.  Protect the skin around your port from seat belts. Place a soft pad over your chest if needed.  Get help right away if you have a fever or you have redness, swelling, pain, drainage, or a bad smell at the port site. This information is not intended to replace advice given to you by your health care provider. Make sure you discuss any questions you have with your health care provider. Document Released: 11/14/2005 Document Revised: 03/08/2019 Document Reviewed: 12/17/2016 Elsevier Patient Education  2020 [employer].

## 2019-09-24 NOTE — Telephone Encounter (Signed)
Scheduled appt per 10/27 los.  Patient will get a print out at his next scheduled appt.

## 2019-09-24 NOTE — Telephone Encounter (Signed)
New message:   LVM for patient to return call to schedule appt from referral received

## 2019-09-24 NOTE — Progress Notes (Signed)
[place]   [phone]) [phone] Fax:(336) [phone]   Clinic Follow up Note   Patient Care Team: [name], [place] as PCP - General (Internal Medicine) Arna Snipe, RN as Oncology Nurse Navigator 09/24/2019  CHIEF COMPLAINT: F/u [place]  SUMMARY OF ONCOLOGIC HISTORY: Oncology History Overview Note  Cancer Staging Small cell lung cancer ([place]) Staging form: Lung, AJCC 8th Edition - Clinical: Stage IVB (cTX, cN2, pM1c) - Signed by Truitt Merle, MD on 06/06/2019    Small cell lung cancer ([place])  07/11/2018 Imaging   CT AP 07/11/18 IMPRESSION: 1. Significantly improved appearance of the pancreas and peripancreatic tissues. Improved to resolved peripancreatic edema with resolved and significantly improved peripancreatic lesions. The remaining lesion was felt to represent abscess on prior endoscopic ultrasound sampling. Pseudocyst or infected pseudocyst would be the favored imaging diagnosis. 2. Persistent but improved mass effect upon the inferior aspect of the portal vein and superior aspect of the superior mesenteric vein. 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 3. Aortic Atherosclerosis (ICD10-I70.0). Bilateral common iliac artery ectasia. 4. A left adrenal nodule is indeterminate on precontrast imaging and may have decreased in size since the prior. Consider dedicated adrenal protocol CT. This could either be performed in 6 months to confirm size stability or more acutely, depending on clinical concern. 5. A splenic lesion is indeterminate and can be re-evaluated on follow-up. 6. Hepatic morphology suspicious for cirrhosis. 7. Cholelithiasis. 8.  Emphysema (ICD10-J43.9).   05/01/2019 Imaging   CT AP 05/01/19  IMPRESSION: 1. New diffuse liver metastases. 2. New mild retroperitoneal lymphadenopathy, consistent with metastatic disease. 3. Resolution of previously seen pancreatic mass since previous study. No radiographic  evidence of acute pancreatitis. 4. Stable left adrenal mass, consistent with adrenal adenoma. 5. Stable markedly enlarged prostate and findings of chronic bladder outlet obstruction. 6. Colonic diverticulosis. No radiographic evidence of diverticulitis. 7. Cholelithiasis.   Aortic Atherosclerosis (ICD10-I70.0).   05/28/2019 PET scan   PET 05/28/19  IMPRESSION: 1. Examination is positive for widespread FDG avid liver metastases. Liver lesions are too numerous to count. 2. Hypermetabolic mediastinal and upper abdominal nodal metastases. 3. Left upper lobe and right upper lobe hypermetabolic pulmonary nodules consistent with pulmonary metastasis. 4. Innumerable liver metastases noted within the axial and proximal appendicular skeleton. Bone lesions are too numerous to count. Some of these have a corresponding lytic lesion on CT images. Others appear occult on the corresponding CT images. 5. Solid hyperdense left adrenal nodule is not significantly changed from 05/01/2019 and exhibits mild FDG uptake. Indeterminate. 6. Prostate gland enlargement. Focal area of increased uptake within the left posterior gland is indeterminate.   06/04/2019 Initial Biopsy   Biopsy 06/04/19  Diagnosis Liver, needle/core biopsy - SMALL CELL CARCINOMA. SEE COMMENT.   06/06/2019 Initial Diagnosis   Small cell lung cancer ([place])   06/06/2019 Cancer Staging   Staging form: Lung, AJCC 8th Edition - Clinical: Stage IVB (cTX, cN2, pM1c) - Signed by Truitt Merle, MD on 06/06/2019   06/10/2019 -  Chemotherapy   Carboplatin Day 1 and Etopside day 1-3 with immunotherapy atezolizumab (Tecentriq) on day 1, every 3 weeks for 6 cycles starting 06/10/19. Plan to continue maintenance Tecentriq afterward.    06/14/2019 Imaging    MRI brain 06/14/19  IMPRESSION: No brain metastases. Chronic small-vessel ischemic changes of the pons and cerebral hemispheric white matter.   Probable metastatic lesions within the upper cervical  spine as seen. No sign of spinal canal encroachment.   07/30/2019 Imaging   CT  CAP W Contrast  IMPRESSION: 1. Marked interval decrease in size of the hypermetabolic bilateral pulmonary nodules seen on previous PET-CT. 2. Clear interval decrease in the numerous hepatic metastases. 3. Interval resolution of mediastinal and upper abdominal lymphadenopathy seen previously. 4. Stable left adrenal nodule. This shows low level FDG uptake on prior PET-CT and is indeterminate. Continued attention on follow-up recommended. 5. No new or progressive findings to suggest disease progression. 6. Stable hypoenhancing splenic lesion. Attention on follow-up recommended. 7. Prostatomegaly. 8.  Aortic Atherosclerois (ICD10-170.0)       CURRENT THERAPY: First lineCarboplatin Day 1 and Etopside day 1-3 with immunotherapyatezolizumab(Tecentriq) on day 1, every 3 weeks starting 06/10/19; s/p 4 cycles; beginning maintenance tecentriq on 09/02/19.  INTERVAL HISTORY: Jeremy Mack returns for f/u and treatment as scheduled. He completed cycle 5 Tecentriq on 09/02/19 (first cycle on maintenance therapy). He feels well today. Denies any new issues. He is tolerating treatment well. Reports good appetite. Denies fatigue, skin rash, fever, chills, cough, chest pain, dyspnea, leg swelling, new/worsening pain, headache, bleeding. Pre-existing neuropathy to left hand first finger is stable on treatment.    MEDICAL HISTORY:  Past Medical History:  Diagnosis Date  . Cancer ([place])   . GERD (gastroesophageal reflux disease)   . Hyperlipemia   . Hypertension     SURGICAL HISTORY: Past Surgical History:  Procedure Laterality Date  . arm fracture surgery Right   . colon polyps removed    . ESOPHAGOGASTRODUODENOSCOPY (EGD) WITH PROPOFOL N/A 06/14/2018   Procedure: ESOPHAGOGASTRODUODENOSCOPY (EGD) WITH PROPOFOL;  Surgeon: [name], MD;  Location: [facility];  Service: Endoscopy;  Laterality: N/A;  . EUS N/A  06/14/2018   Procedure: ESOPHAGEAL ENDOSCOPIC ULTRASOUND (EUS) RADIAL;  Surgeon: [name], MD;  Location: [facility];  Service: Endoscopy;  Laterality: N/A;  . FINE NEEDLE ASPIRATION N/A 06/14/2018   Procedure: FINE NEEDLE ASPIRATION (FNA) LINEAR;  Surgeon: [name], MD;  Location: [facility];  Service: Endoscopy;  Laterality: N/A;  . IR IMAGING GUIDED PORT INSERTION  08/26/2019  . TONSILLECTOMY      I have reviewed the social history and family history with the patient and they are unchanged from previous note.  ALLERGIES:  has No Known Allergies.  MEDICATIONS:  Current Outpatient Medications  Medication Sig Dispense Refill  . allopurinol (ZYLOPRIM) 300 MG tablet TAKE 1 TABLET BY MOUTH TWICE A DAY 60 tablet 0  . amLODipine (NORVASC) 10 MG tablet Take 1 tablet (10 mg total) by mouth daily. 90 tablet 0  . aspirin-sod bicarb-citric acid (ALKA-SELTZER) 325 MG TBEF tablet Take 650 mg by mouth every 6 (six) hours as needed.    . lidocaine-prilocaine (EMLA) cream Apply 1 application topically as needed. 30 g 3  . Omega-3 Fatty Acids (FISH OIL) 1000 MG CAPS Take 1 capsule by mouth daily.    . ondansetron (ZOFRAN) 8 MG tablet Take 1 tablet (8 mg total) by mouth 2 (two) times daily as needed for refractory nausea / vomiting. Start on day 3 after carboplatin chemo. 30 tablet 1  . pantoprazole (PROTONIX) 20 MG tablet TAKE 1 TABLET BY MOUTH EVERY DAY 30 tablet 2  . prochlorperazine (COMPAZINE) 10 MG tablet Take 1 tablet (10 mg total) by mouth every 6 (six) hours as needed (Nausea or vomiting). 30 tablet 1  . traMADol (ULTRAM) 50 MG tablet Take 1 tablet (50 mg total) by mouth every 12 (twelve) hours as needed. 30 tablet 0  . nicotine (NICODERM CQ - DOSED IN MG/24 HOURS)  14 mg/24hr patch Place 1 patch (14 mg total) onto the skin daily. (Patient not taking: Reported on 09/24/2019) 28 patch 0   No current facility-administered medications for this visit.    Facility-Administered  Medications Ordered in Other Visits  Medication Dose Route Frequency Provider Last Rate Last Dose  . sodium chloride flush (NS) 0.9 % injection 10 mL  10 mL Intracatheter PRN Truitt Merle, MD   10 mL at 09/24/19 1225    PHYSICAL EXAMINATION: ECOG PERFORMANCE STATUS: 0 - Asymptomatic  Vitals:   09/24/19 0957  BP: (!) 146/83  Pulse: 79  Resp: 18  Temp: 98.3 F (36.8 C)  SpO2: 100%   Filed Weights   09/24/19 0957  Weight: 173 lb (78.5 kg)    GENERAL:alert, no distress and comfortable SKIN: no rash  EYES:  sclera clear OROPHARYNX: no thrush or ulcers NECK: without mass LUNGS: clear with normal breathing effort HEART: regular rate & rhythm, no lower extremity edema ABDOMEN:abdomen soft, non-tender and normal bowel sounds Musculoskeletal: no focal spinal tenderness  NEURO: alert & oriented x 3 with fluent speech, no focal motor/sensory deficits PAC without erythema   LABORATORY DATA:  I have reviewed the data as listed CBC Latest Ref Rng & Units 09/24/2019 09/02/2019 08/26/2019  WBC 4.0 - 10.5 K/uL 5.9 10.8(H) 7.7  Hemoglobin 13.0 - 17.0 g/dL 12.0(L) 9.6(L) 9.5(L)  Hematocrit 39.0 - 52.0 % 36.8(L) 29.1(L) 30.3(L)  Platelets 150 - 400 K/uL 216 269 86(L)     CMP Latest Ref Rng & Units 09/24/2019 09/02/2019 08/26/2019  Glucose 70 - 99 mg/dL 114(H) 118(H) 128(H)  BUN 8 - 23 mg/dL 13 7(L) 9  Creatinine 0.61 - 1.24 mg/dL 1.17 0.96 0.99  Sodium 135 - 145 mmol/L 142 146(H) 142  Potassium 3.5 - 5.1 mmol/L 4.2 3.9 3.6  Chloride 98 - 111 mmol/L 109 113(H) 107  CO2 22 - 32 mmol/L 24 22 21(L)  Calcium 8.9 - 10.3 mg/dL 9.2 8.7(L) 9.1  Total Protein 6.5 - 8.1 g/dL 7.3 6.9 -  Total Bilirubin 0.3 - 1.2 mg/dL 0.4 0.3 -  Alkaline Phos 38 - 126 U/L 133(H) 146(H) -  AST 15 - 41 U/L 15 12(L) -  ALT 0 - 44 U/L 9 7 -      RADIOGRAPHIC STUDIES: I have personally reviewed the radiological images as listed and agreed with the findings in the report. No results found.   ASSESSMENT & PLAN:  Jeremy Mack a 73 y.o.malewith   1.Small Cell Lung Cancer,Right lung primary,Metastatic to liver,left lung, LNs andbone -PET scan withdiffuse metastatic cancer in liver, upper abdominal nodal mets,bilateral lung lesion, right mediastinal nodes and diffuse bone mets. -Liverbiopsyfrom 06/04/19 which shows metastatic Small Cell lung cancer,TTF(+). This is most consistent with lung primary.Given his mediastinal lymph node distribution,right lung lesion likely the primary tumor. -No brain metastasis as seen on 06/14/19 brain MRI. -He began systemicfirst lineCarboplatin on day 1 and Etopside on day 1-3 with immunotherapy atezolizumab(Tecentriq) on day 1 treatment every 3 weeks -goal of treatment is palliative -For tumor lysis syndrome preventionhe is on allopurinol -He had excellent response to treatment after cycle 3 and completed 4 total cycles. His insurance did not approve carboplatin and etoposide for cycles 5 and 6.  -Began maintenance tecentrip on 10/5, tolerating very well overall   2. AKI -06/04/19 Cr at 1.70, previously normal.Possible related to dehydration vs mild tumor lysis -He is currently on Allopurinol with treatment. -Cr normalized  3. H/o pancreatic abscess  -imaging in 05/2018 suspicious  for neoplasm, CA 19-9 was 1 -EUS and FNA per Dr. Ardis Hughs showed inflammation, no malignancy -s/p two 3-week courses of Augmentin in 05/2018 for pancreatic abscess, f/u CT showed near resolution of pancreatic abnormality -no pancreas mass on 04/2019 CT -resolved  4. SmokingCessation,H/oalcohol use  -He reports a past history of alcohol use for 10-15 years, he does not drink now. -CT in 05/2018 was concerning for hepatic cirrhosis -has 30 year smoking history.  -Liver biopsy from 05/2019 shows metastatic small Cell Lung cancer, Likely related to his heavy smoking history. -nicotine patch is helping him quit  5. Enlarged prostate, elevated PSA  -PSA in 03/2019  9.47, up from 6.8 two years ago -enlarged prostate on CT  -He waspreviouslyreferred to urology by PCP  6.Goal of care discussion  -The patient understands the goal of care is palliative. -he is full code now  7. Neck pain -noted in 06/2019, possibly related to lytic lesion within C5 vertebra -he met with rad onc to discuss palliative RT but he declined -pain resolved -denies new or worsening pain  Disposition:  Jeremy Mack appears well. He continues maintenance tecentriq which he tolerates very well without significant side effects. CBC and CMP are stable. TSH remains normal. He will proceed with another cycle today and continue q3 weeks. Plan to restage in 6 weeks, will order scan at next visit. He is being referred back to Dr. Lisbeth Renshaw to discuss consolidation chest radiation and PCI, patient agrees.    Orders Placed This Encounter  Procedures  . Ambulatory referral to Radiation Oncology    Referral Priority:   Routine    Referral Type:   Consultation    Referral Reason:   Specialty Services Required    Referred to Provider:   Kyung Rudd, MD    Requested Specialty:   Radiation Oncology    Number of Visits Requested:   1   All questions were answered. The patient knows to call the clinic with any problems, questions or concerns. No barriers to learning was detected.     Alla Feeling, NP 09/24/19

## 2019-09-25 ENCOUNTER — Telehealth: Payer: Self-pay | Admitting: Radiation Oncology

## 2019-09-25 NOTE — Telephone Encounter (Signed)
Contacted patient to verify telephone visit for pre reg °

## 2019-09-26 ENCOUNTER — Other Ambulatory Visit: Payer: Self-pay

## 2019-09-26 ENCOUNTER — Encounter: Payer: Self-pay | Admitting: Radiation Oncology

## 2019-09-26 ENCOUNTER — Ambulatory Visit
Admission: RE | Admit: 2019-09-26 | Discharge: 2019-09-26 | Disposition: A | Discharge status: Home / Self Care | Payer: Medicare PPO | Source: Ambulatory Visit | Attending: Radiation Oncology | Admitting: Radiation Oncology

## 2019-09-26 ENCOUNTER — Telehealth: Payer: Self-pay | Admitting: *Deleted

## 2019-09-26 DIAGNOSIS — C3490 Malignant neoplasm of unspecified part of unspecified bronchus or lung: Secondary | ICD-10-CM

## 2019-09-26 NOTE — Progress Notes (Signed)
Radiation Oncology         [phone] ________________________________  Outpatient Follow Up - Conducted via telephone due to current COVID-19 concerns for limiting patient exposure  I spoke with the patient to conduct this consult visit via telephone to spare the patient unnecessary potential exposure in the healthcare setting during the current COVID-19 pandemic. The patient was notified in advance and was offered a [place] meeting to allow for face to face communication but unfortunately reported that they did not have the appropriate resources/technology to support such a visit and instead preferred to proceed with a telephone visit.   ________________________________  Name: Jeremy Mack        MRN: [id number]  Date of Service: 09/26/2019 DOB: Nov 08, 1946  TF:[id number], Marvis Repress, FNP  Truitt Merle, MD     REFERRING PHYSICIAN: Truitt Merle, MD   DIAGNOSIS: The primary encounter diagnosis was Small cell lung cancer [facility]). A diagnosis of Malignant neoplasm of unspecified part of unspecified bronchus or lung ([place]) was also pertinent to this visit.   HISTORY OF PRESENT ILLNESS: Jeremy Mack is a 73 y.o. male seen at the request of [name] for an extensive stage lung cancer diagnosed in July 2020 involving the lungs, liver, mediastinal and upper abdominal adenopathy. He had negative imaging of the brain, and began carboplatin/etoposide/tecentriq which he begame on 07/01/2019. He had recent scan on 07/30/2019 that showed improvement in his systemic disease. He had complained of neck pain at his a visit with [name], NP, and given his imaging showing a C5 lesion, an MRI of the c spine was ordered. He was also given pain medication. His MRI on 07/31/2019 revealed multiple lesions in the c spine specifically there was a 2.1 x 1.2 cm lesion at C2, and a lobular appearing lesion at C6-C7 revealed mild spinal stenosis and hyperintense lobular focus in the right aspect of the spinal canal measuring 1.1 x .6 cm.  This was a possible perineural cyst versus disc extension. There was effacement of the right anterolateral thecal sac at the right foraminal entry zone. He was offered palliative radiotherapy to the site initially but after discussing his symptoms they were felt to be so infrequent that we did not pursue treatment. He has continued on systemic therapy and is currently receiving tecentriq consoladative immunotherapy. He's contacted to discuss options of treatment to the chest, PCI, and any other symptomatic sites.  PREVIOUS RADIATION THERAPY: No   PAST MEDICAL HISTORY:  Past Medical History:  Diagnosis Date   Cancer ([place])    GERD (gastroesophageal reflux disease)    Hyperlipemia    Hypertension        PAST SURGICAL HISTORY: Past Surgical History:  Procedure Laterality Date   arm fracture surgery Right    colon polyps removed     ESOPHAGOGASTRODUODENOSCOPY (EGD) WITH PROPOFOL N/A 06/14/2018   Procedure: ESOPHAGOGASTRODUODENOSCOPY (EGD) WITH PROPOFOL;  Surgeon: [name], MD;  Location: [facility];  Service: Endoscopy;  Laterality: N/A;   EUS N/A 06/14/2018   Procedure: ESOPHAGEAL ENDOSCOPIC ULTRASOUND (EUS) RADIAL;  Surgeon: [name], MD;  Location: [facility];  Service: Endoscopy;  Laterality: N/A;   FINE NEEDLE ASPIRATION N/A 06/14/2018   Procedure: FINE NEEDLE ASPIRATION (FNA) LINEAR;  Surgeon: [name], MD;  Location: [facility];  Service: Endoscopy;  Laterality: N/A;   IR IMAGING GUIDED PORT INSERTION  08/26/2019   TONSILLECTOMY       FAMILY HISTORY:  Family History  Problem Relation Age of Onset  Diabetes Mother    Diabetes Maternal Grandmother      SOCIAL HISTORY:  reports that he has been smoking cigars. He has been smoking about 0.50 packs per day. He has never used smokeless tobacco. He reports current alcohol use. He reports that he does not use drugs. The patient is married and lives in [place]. He is retired from working in  Psychologist, educational and enjoys gardening in retirement.   ALLERGIES: Patient has no known allergies.   MEDICATIONS:  Current Outpatient Medications  Medication Sig Dispense Refill   allopurinol (ZYLOPRIM) 300 MG tablet TAKE 1 TABLET BY MOUTH TWICE A DAY 60 tablet 0   amLODipine (NORVASC) 10 MG tablet Take 1 tablet (10 mg total) by mouth daily. 90 tablet 0   aspirin-sod bicarb-citric acid (ALKA-SELTZER) 325 MG TBEF tablet Take 650 mg by mouth every 6 (six) hours as needed.     lidocaine-prilocaine (EMLA) cream Apply 1 application topically as needed. 30 g 3   Omega-3 Fatty Acids (FISH OIL) 1000 MG CAPS Take 1 capsule by mouth daily.     ondansetron (ZOFRAN) 8 MG tablet Take 1 tablet (8 mg total) by mouth 2 (two) times daily as needed for refractory nausea / vomiting. Start on day 3 after carboplatin chemo. 30 tablet 1   pantoprazole (PROTONIX) 20 MG tablet TAKE 1 TABLET BY MOUTH EVERY DAY 30 tablet 2   prochlorperazine (COMPAZINE) 10 MG tablet Take 1 tablet (10 mg total) by mouth every 6 (six) hours as needed (Nausea or vomiting). 30 tablet 1   traMADol (ULTRAM) 50 MG tablet Take 1 tablet (50 mg total) by mouth every 12 (twelve) hours as needed. 30 tablet 0   nicotine (NICODERM CQ - DOSED IN MG/24 HOURS) 14 mg/24hr patch Place 1 patch (14 mg total) onto the skin daily. (Patient not taking: Reported on 09/24/2019) 28 patch 0   No current facility-administered medications for this encounter.       REVIEW OF SYSTEMS: On review of systems, the patient reports that he is doing well overall. He reports his neck pain is again so infrequent and he's been pleased in how he feels overall. He denies any chest pain, shortness of breath, cough, fevers, chills, night sweats, unintended weight changes. He denies any bowel or bladder disturbances, and denies abdominal pain, nausea or vomiting. He denies any new musculoskeletal or joint aches or pains. A complete review of systems is obtained and is  otherwise negative.     PHYSICAL EXAM:  Unable to assess due to encounter type.    ECOG = 0  0 - Asymptomatic (Fully active, able to carry on all predisease activities without restriction)  1 - Symptomatic but completely ambulatory (Restricted in physically strenuous activity but ambulatory and able to carry out work of a light or sedentary nature. For example, light housework, office work)  2 - Symptomatic, <50% in bed during the day (Ambulatory and capable of all self care but unable to carry out any work activities. Up and about more than 50% of waking hours)  3 - Symptomatic, >50% in bed, but not bedbound (Capable of only limited self-care, confined to bed or chair 50% or more of waking hours)  4 - Bedbound (Completely disabled. Cannot carry on any self-care. Totally confined to bed or chair)  5 - Death   Eustace Pen MM, Creech RH, Tormey DC, et al. [phone]). "Toxicity and response criteria of the [facility]". [place] Oncol. 5 (6): [phone]  IMPRESSION/PLAN: 1. Extensive stage small cell carcinoma of the lung with lung, liver, bone, and lymph node involvement with significant response to systemic therapy. Dr. Lisbeth Renshaw reviews the findings from the patient's imaging and does not feel that the size of the pulmonary disease is large enough to warrant radiotherapy. We could always consider this at a later time, but his prior chemotherapy really gave him a nice response. With his neck, he again seems to be doing so well that there does not appear to be a benefit to radiotherapy to that site. We will follow along expectantly with these sites.  2. PCI. We did spend time discussing the options of prophylactic cranial irradiation due to his small cell histology and the significant improvement he's had systemically. Dr. Lisbeth Renshaw would recommend a repeat MRI to rule out any disease at baseline, but would offer whole brain radiotherapy to reduce his chances of developing brain  disease, or if there were lesions present treat them as well.  We discussed the risks, benefits, short, and long term effects of radiotherapy. Dr. Lisbeth Renshaw discusses the delivery and logistics of radiotherapy and anticipates a course of 2 weeks of radiotherapy. We will coordinate simulation following an MRI that I've ordered. He will come for simulation once we know this MRI date and sign consent to proceed at that time.   Given current concerns for patient exposure during the COVID-19 pandemic, this encounter was conducted via telephone.  The patient has given verbal consent for this type of encounter. The time spent during this encounter was 15 minutes and 50% of that time was spent in the coordination of his care. The attendants for this meeting include Dr. Lisbeth Renshaw,  Shona Simpson, [facility] and Yvette Rack. During the encounter, Dr. Jackelyn Knife [facility] were located at [facility] Radiation Oncology Department.  Rodricus Candelaria was located at home.   The above documentation reflects my direct findings during this shared patient visit. Please see the separate note by Dr. Lisbeth Renshaw on this date for the remainder of the patient's plan of care.    Carola Rhine, PAC

## 2019-09-26 NOTE — Telephone Encounter (Signed)
Called patient to inform of MRI for Monday Nov. 2- arrival time- 6:30 pm @ [facility], no restrictions to test, spoke with patient and he is aware of this test

## 2019-09-26 NOTE — Progress Notes (Signed)
Spoke with patient via phone questions and concerns addressed No complaints of voiced today

## 2019-09-30 ENCOUNTER — Ambulatory Visit (HOSPITAL_COMMUNITY)
Admission: RE | Admit: 2019-09-30 | Discharge: 2019-09-30 | Disposition: A | Discharge status: Home / Self Care | Payer: Medicare PPO | Source: Ambulatory Visit | Attending: Radiation Oncology | Admitting: Radiation Oncology

## 2019-09-30 ENCOUNTER — Other Ambulatory Visit: Payer: Self-pay

## 2019-09-30 DIAGNOSIS — C3490 Malignant neoplasm of unspecified part of unspecified bronchus or lung: Secondary | ICD-10-CM | POA: Insufficient documentation

## 2019-09-30 DIAGNOSIS — I6300 Cerebral infarction due to thrombosis of unspecified precerebral artery: Secondary | ICD-10-CM | POA: Diagnosis not present

## 2019-09-30 MED ORDER — GADOBUTROL 1 MMOL/ML IV SOLN
8.0000 mL | Freq: Once | INTRAVENOUS | Status: AC | PRN
  Administered 2019-09-30: 8 mL via INTRAVENOUS

## 2019-10-01 ENCOUNTER — Inpatient Hospital Stay (HOSPITAL_COMMUNITY)
Admission: EM | Admit: 2019-10-01 | Discharge: 2019-10-03 | DRG: 064 | Disposition: A | Discharge status: Home / Self Care | Payer: Medicare PPO | Attending: Internal Medicine | Admitting: Internal Medicine

## 2019-10-01 ENCOUNTER — Encounter (HOSPITAL_COMMUNITY): Payer: Self-pay | Admitting: Emergency Medicine

## 2019-10-01 ENCOUNTER — Other Ambulatory Visit: Payer: Self-pay

## 2019-10-01 ENCOUNTER — Telehealth: Payer: Self-pay | Admitting: Internal Medicine

## 2019-10-01 ENCOUNTER — Telehealth: Payer: Self-pay | Admitting: Radiation Oncology

## 2019-10-01 DIAGNOSIS — Z79899 Other long term (current) drug therapy: Secondary | ICD-10-CM | POA: Diagnosis not present

## 2019-10-01 DIAGNOSIS — I10 Essential (primary) hypertension: Secondary | ICD-10-CM | POA: Diagnosis present

## 2019-10-01 DIAGNOSIS — I6340 Cerebral infarction due to embolism of unspecified cerebral artery: Secondary | ICD-10-CM | POA: Diagnosis present

## 2019-10-01 DIAGNOSIS — C7951 Secondary malignant neoplasm of bone: Secondary | ICD-10-CM | POA: Diagnosis present

## 2019-10-01 DIAGNOSIS — E785 Hyperlipidemia, unspecified: Secondary | ICD-10-CM | POA: Diagnosis present

## 2019-10-01 DIAGNOSIS — K219 Gastro-esophageal reflux disease without esophagitis: Secondary | ICD-10-CM | POA: Diagnosis present

## 2019-10-01 DIAGNOSIS — Z7982 Long term (current) use of aspirin: Secondary | ICD-10-CM | POA: Diagnosis not present

## 2019-10-01 DIAGNOSIS — Z8719 Personal history of other diseases of the digestive system: Secondary | ICD-10-CM | POA: Diagnosis not present

## 2019-10-01 DIAGNOSIS — F17290 Nicotine dependence, other tobacco product, uncomplicated: Secondary | ICD-10-CM | POA: Diagnosis present

## 2019-10-01 DIAGNOSIS — I639 Cerebral infarction, unspecified: Secondary | ICD-10-CM | POA: Diagnosis present

## 2019-10-01 DIAGNOSIS — R29700 NIHSS score 0: Secondary | ICD-10-CM | POA: Diagnosis present

## 2019-10-01 DIAGNOSIS — C3490 Malignant neoplasm of unspecified part of unspecified bronchus or lung: Secondary | ICD-10-CM | POA: Diagnosis present

## 2019-10-01 DIAGNOSIS — Z20828 Contact with and (suspected) exposure to other viral communicable diseases: Secondary | ICD-10-CM | POA: Diagnosis present

## 2019-10-01 DIAGNOSIS — Z833 Family history of diabetes mellitus: Secondary | ICD-10-CM | POA: Diagnosis not present

## 2019-10-01 DIAGNOSIS — I6300 Cerebral infarction due to thrombosis of unspecified precerebral artery: Secondary | ICD-10-CM | POA: Diagnosis present

## 2019-10-01 DIAGNOSIS — C787 Secondary malignant neoplasm of liver and intrahepatic bile duct: Secondary | ICD-10-CM | POA: Diagnosis present

## 2019-10-01 DIAGNOSIS — Z9221 Personal history of antineoplastic chemotherapy: Secondary | ICD-10-CM

## 2019-10-01 DIAGNOSIS — Z515 Encounter for palliative care: Secondary | ICD-10-CM | POA: Diagnosis present

## 2019-10-01 DIAGNOSIS — I6389 Other cerebral infarction: Secondary | ICD-10-CM | POA: Diagnosis not present

## 2019-10-01 LAB — DIFFERENTIAL
Abs Immature Granulocytes: 0.01 10*3/uL (ref 0.00–0.07)
Basophils Absolute: 0.1 10*3/uL (ref 0.0–0.1)
Basophils Relative: 1 %
Eosinophils Absolute: 0.1 10*3/uL (ref 0.0–0.5)
Eosinophils Relative: 1 %
Immature Granulocytes: 0 %
Lymphocytes Relative: 34 %
Lymphs Abs: 2.2 10*3/uL (ref 0.7–4.0)
Monocytes Absolute: 0.4 10*3/uL (ref 0.1–1.0)
Monocytes Relative: 6 %
Neutro Abs: 3.7 10*3/uL (ref 1.7–7.7)
Neutrophils Relative %: 58 %

## 2019-10-01 LAB — URINALYSIS, ROUTINE W REFLEX MICROSCOPIC
Bilirubin Urine: NEGATIVE
Glucose, UA: NEGATIVE mg/dL
Hgb urine dipstick: NEGATIVE
Ketones, ur: NEGATIVE mg/dL
Leukocytes,Ua: NEGATIVE
Nitrite: NEGATIVE
Protein, ur: NEGATIVE mg/dL
Specific Gravity, Urine: 1.017 (ref 1.005–1.030)
pH: 6 (ref 5.0–8.0)

## 2019-10-01 LAB — COMPREHENSIVE METABOLIC PANEL
ALT: 13 U/L (ref 0–44)
AST: 18 U/L (ref 15–41)
Albumin: 4.2 g/dL (ref 3.5–5.0)
Alkaline Phosphatase: 127 U/L — ABNORMAL HIGH (ref 38–126)
Anion gap: 11 (ref 5–15)
BUN: 12 mg/dL (ref 8–23)
CO2: 22 mmol/L (ref 22–32)
Calcium: 9.2 mg/dL (ref 8.9–10.3)
Chloride: 106 mmol/L (ref 98–111)
Creatinine, Ser: 1.08 mg/dL (ref 0.61–1.24)
GFR calc Af Amer: >60 mL/min (ref >60)
GFR calc non Af Amer: >60 mL/min (ref >60)
Glucose, Bld: 102 mg/dL — ABNORMAL HIGH (ref 70–99)
Potassium: 3.8 mmol/L (ref 3.5–5.1)
Sodium: 139 mmol/L (ref 135–145)
Total Bilirubin: 0.7 mg/dL (ref 0.3–1.2)
Total Protein: 8.1 g/dL (ref 6.5–8.1)

## 2019-10-01 LAB — RAPID URINE DRUG SCREEN, HOSP PERFORMED
Amphetamines: NOT DETECTED
Barbiturates: NOT DETECTED
Benzodiazepines: NOT DETECTED
Cocaine: NOT DETECTED
Opiates: NOT DETECTED
Tetrahydrocannabinol: POSITIVE — AB

## 2019-10-01 LAB — CBC
HCT: 41 % (ref 39.0–52.0)
Hemoglobin: 13.1 g/dL (ref 13.0–17.0)
MCH: 33.9 pg (ref 26.0–34.0)
MCHC: 32 g/dL (ref 30.0–36.0)
MCV: 106.2 fL — ABNORMAL HIGH (ref 80.0–100.0)
Platelets: 174 10*3/uL (ref 150–400)
RBC: 3.86 MIL/uL — ABNORMAL LOW (ref 4.22–5.81)
RDW: 15.1 % (ref 11.5–15.5)
WBC: 6.4 10*3/uL (ref 4.0–10.5)
nRBC: 0 % (ref 0.0–0.2)

## 2019-10-01 LAB — APTT: aPTT: 32 seconds (ref 24–36)

## 2019-10-01 LAB — PROTIME-INR
INR: 1.1 (ref 0.8–1.2)
Prothrombin Time: 14.3 seconds (ref 11.4–15.2)

## 2019-10-01 LAB — SARS CORONAVIRUS 2 (TAT 6-24 HRS): SARS Coronavirus 2: NEGATIVE

## 2019-10-01 LAB — ETHANOL: Alcohol, Ethyl (B): <10 mg/dL (ref <10)

## 2019-10-01 MED ORDER — MUSCLE RUB 10-15 % EX CREA: TOPICAL_CREAM | CUTANEOUS | Status: DC | PRN

## 2019-10-01 MED ORDER — ALLOPURINOL 100 MG PO TABS
300.0000 mg | ORAL_TABLET | Freq: Two times a day (BID) | ORAL | Status: DC
  Administered 2019-10-01 – 2019-10-03 (×4): 300 mg via ORAL
  Filled 2019-10-01: qty 1
  Filled 2019-10-01 (×3): qty 3

## 2019-10-01 MED ORDER — METHOCARBAMOL 500 MG PO TABS
750.0000 mg | ORAL_TABLET | Freq: Once | ORAL | Status: AC
  Administered 2019-10-01: 750 mg via ORAL
  Filled 2019-10-01: qty 2

## 2019-10-01 MED ORDER — ATORVASTATIN CALCIUM 80 MG PO TABS
80.0000 mg | ORAL_TABLET | Freq: Every day | ORAL | Status: DC
  Administered 2019-10-01: 80 mg via ORAL
  Filled 2019-10-01: qty 1

## 2019-10-01 MED ORDER — ACETAMINOPHEN 650 MG RE SUPP: 650.0000 mg | RECTAL | Status: DC | PRN

## 2019-10-01 MED ORDER — STROKE: EARLY STAGES OF RECOVERY BOOK
Freq: Once | Status: AC
  Administered 2019-10-01: 22:00:00
  Filled 2019-10-01: qty 1

## 2019-10-01 MED ORDER — PANTOPRAZOLE SODIUM 20 MG PO TBEC
20.0000 mg | DELAYED_RELEASE_TABLET | Freq: Every day | ORAL | Status: DC
  Administered 2019-10-01 – 2019-10-03 (×3): 20 mg via ORAL
  Filled 2019-10-01 (×3): qty 1

## 2019-10-01 MED ORDER — ACETAMINOPHEN 160 MG/5ML PO SOLN: 650.0000 mg | ORAL | Status: DC | PRN

## 2019-10-01 MED ORDER — ONDANSETRON HCL 4 MG PO TABS: 8.0000 mg | ORAL_TABLET | Freq: Two times a day (BID) | ORAL | Status: DC | PRN

## 2019-10-01 MED ORDER — ACETAMINOPHEN 325 MG PO TABS: 650.0000 mg | ORAL_TABLET | Freq: Four times a day (QID) | ORAL | Status: DC | PRN

## 2019-10-01 MED ORDER — POLYVINYL ALCOHOL 1.4 % OP SOLN: 1.0000 [drp] | OPHTHALMIC | Status: DC | PRN

## 2019-10-01 MED ORDER — ACETAMINOPHEN 325 MG PO TABS: 650.0000 mg | ORAL_TABLET | ORAL | Status: DC | PRN

## 2019-10-01 MED ORDER — OMEGA-3-ACID ETHYL ESTERS 1 G PO CAPS
1.0000 g | ORAL_CAPSULE | Freq: Every day | ORAL | Status: DC
  Administered 2019-10-02 – 2019-10-03 (×2): 1 g via ORAL
  Filled 2019-10-01 (×2): qty 1

## 2019-10-01 NOTE — H&P (Addendum)
History and Physical    Jeremy Mack QMG:[id number] DOB: November 15, 1946 DOA: 10/01/2019  PCP: [name], [place]  Patient coming from: Home  I have personally briefly reviewed patient's old medical records in [place]  Chief Complaint: p/w Abnormal imaging  HPI: Jeremy Mack is a 73 y.o. male with medical history significant for Stage IV Small Cell Lung Cancer (s/p 5 cycles of Tecentriq) with C-spine mets, Liver and Bone, HTN, HLD and GERD presents after findings of two new infarcts (acute in appearance) on MRI.  Patient reports he is still maintained at his most recent baseline state of health.  He has been undergoing palliative treatment with Tecentriq with Heme-Onc without issue and was having MRI to assess for brain mets and in advance of possible need for palliative radiation and incidentally was noted to have "small foci of acute infarction involving the lateral right thalamus/posterior limb of the internal capsule and right cerebellar hemisphere" without evidence of metastatic disease.  Patient reports apart from possible LLE feeling weaker in the past 1-2 weeks no other acute symptoms.  He reports he is still able to bear weight and ambulate without issue on the LLE but feels as though it is weaker.  He has neuropathy in his hands (ongoing).  He denies any vertigo, nausea/vomiting, no gait instability, no dysarthria, dysphagia, no focal weakness or numbness, no facial droop, and no transient symptoms.  He denies any hx of TIA or CVA.  He denies any chest pain or palpitations, never been told of an abnormal heart rhythm.  He states he was surprised to be notified of the findings as he has not had any new symptoms.  He currently smokes 2-3 cigarettes a day, formerly more and reports he has been smoking for 10-15 years Occasional EtOH No drugs reported but THC on UDS  Lives at home with his wife, otherwise independent in ADLs  Review of Systems: As per HPI otherwise 10 point review  of systems negative.    Past Medical History:  Diagnosis Date   Cancer ([place])    GERD (gastroesophageal reflux disease)    Hyperlipemia    Hypertension     Past Surgical History:  Procedure Laterality Date   arm fracture surgery Right    colon polyps removed     ESOPHAGOGASTRODUODENOSCOPY (EGD) WITH PROPOFOL N/A 06/14/2018   Procedure: ESOPHAGOGASTRODUODENOSCOPY (EGD) WITH PROPOFOL;  Surgeon: [name], MD;  Location: [facility];  Service: Endoscopy;  Laterality: N/A;   EUS N/A 06/14/2018   Procedure: ESOPHAGEAL ENDOSCOPIC ULTRASOUND (EUS) RADIAL;  Surgeon: [name], MD;  Location: [facility];  Service: Endoscopy;  Laterality: N/A;   FINE NEEDLE ASPIRATION N/A 06/14/2018   Procedure: FINE NEEDLE ASPIRATION (FNA) LINEAR;  Surgeon: [name], MD;  Location: [facility];  Service: Endoscopy;  Laterality: N/A;   IR IMAGING GUIDED PORT INSERTION  08/26/2019   TONSILLECTOMY       reports that he has been smoking cigars. He has been smoking about 0.50 packs per day. He has never used smokeless tobacco. He reports current alcohol use. He reports that he does not use drugs.  No Known Allergies  Family History  Problem Relation Age of Onset   Diabetes Mother    Diabetes Maternal Grandmother      Prior to Admission medications   Medication Sig Start Date End Date Taking? Authorizing Provider  allopurinol (ZYLOPRIM) 300 MG tablet TAKE 1 TABLET BY MOUTH TWICE A DAY Patient taking differently: Take 300 mg  by mouth 2 (two) times daily.  07/01/19  Yes Truitt Merle, MD  amLODipine (NORVASC) 10 MG tablet Take 1 tablet (10 mg total) by mouth daily. 05/24/19  Yes [name], FNP  aspirin 325 MG tablet Take 650 mg by mouth every 4 (four) hours as needed for mild pain or headache.   Yes [provider]  lidocaine-prilocaine (EMLA) cream Apply 1 application topically as needed. Patient taking differently: Apply 1 application topically as needed  (port access).  09/02/19  Yes Alla Feeling, NP  Omega-3 Fatty Acids (FISH OIL) 1000 MG CAPS Take 1,000 mg by mouth daily.    Yes [provider]  ondansetron (ZOFRAN) 8 MG tablet Take 1 tablet (8 mg total) by mouth 2 (two) times daily as needed for refractory nausea / vomiting. Start on day 3 after carboplatin chemo. 06/06/19  Yes Truitt Merle, MD  pantoprazole (PROTONIX) 20 MG tablet TAKE 1 TABLET BY MOUTH EVERY DAY Patient taking differently: Take 20 mg by mouth daily.  09/05/19  Yes Alla Feeling, NP  polyvinyl alcohol (LIQUIFILM TEARS) 1.4 % ophthalmic solution Place 1 drop into both eyes as needed for dry eyes.   Yes [provider]  prochlorperazine (COMPAZINE) 10 MG tablet Take 1 tablet (10 mg total) by mouth every 6 (six) hours as needed (Nausea or vomiting). 06/06/19  Yes Truitt Merle, MD  traMADol (ULTRAM) 50 MG tablet Take 1 tablet (50 mg total) by mouth every 12 (twelve) hours as needed. Patient taking differently: Take 50 mg by mouth every 12 (twelve) hours as needed for moderate pain.  09/02/19  Yes Alla Feeling, NP  nicotine (NICODERM CQ - DOSED IN MG/24 HOURS) 14 mg/24hr patch Place 1 patch (14 mg total) onto the skin daily. Patient not taking: Reported on 09/24/2019 06/06/19   Truitt Merle, MD    Physical Exam: Vitals:   10/01/19 1318 10/01/19 1430  BP: (!) 188/102 (!) 170/106  Pulse: 93 88  Resp: 19 19  Temp: 98.7 F (37.1 C)   TempSrc: Oral   SpO2: 100% 99%    Constitutional: NAD, calm, comfortable Vitals:   10/01/19 1318 10/01/19 1430  BP: (!) 188/102 (!) 170/106  Pulse: 93 88  Resp: 19 19  Temp: 98.7 F (37.1 C)   TempSrc: Oral   SpO2: 100% 99%   General: pleasant, NAD Eyes: EOMI, anicteric sclera ENMT: Mucous membranes are moist. Posterior pharynx clear of any exudate or lesions.Normal dentition.  Neck: normal, supple, no masses, no thyromegaly Respiratory: clear to auscultation bilaterally, no wheezing, no crackles. Normal respiratory effort. No  accessory muscle use.  Cardiovascular: Regular rate and rhythm, no murmurs / rubs / gallops. No extremity edema. 2+ pedal pulses.  Abdomen: no tenderness, no masses palpated. No hepatosplenomegaly. Bowel sounds positive.  Musculoskeletal: no clubbing / cyanosis. No joint deformity upper and lower extremities. Good ROM, no contractures. Normal muscle tone.  Skin: no rashes, lesions, ulcers. No induration Neurologic: CN 2-12 grossly intact. Sensation intact, Strength 5/5 BL LE, 4+/5 LUE, 5/5 RUE, no focal motor sensory deficits appreciated.  Speech intact. Psychiatric: Normal judgment and insight. Alert and oriented x 3. Normal mood.    Labs on Admission: I have personally reviewed following labs and imaging studies  CBC: Recent Labs  Lab 10/01/19 1312  WBC 6.4  NEUTROABS 3.7  HGB 13.1  HCT 41.0  MCV 106.2*  PLT 536   Basic Metabolic Panel: Recent Labs  Lab 10/01/19 1312  NA 139  K 3.8  CL 106  CO2 22  GLUCOSE 102*  BUN 12  CREATININE 1.08  CALCIUM 9.2   GFR: Estimated Creatinine Clearance: 66.8 mL/min (by C-G formula based on SCr of 1.08 mg/dL). Liver Function Tests: Recent Labs  Lab 10/01/19 1312  AST 18  ALT 13  ALKPHOS 127*  BILITOT 0.7  PROT 8.1  ALBUMIN 4.2   No results for input(s): LIPASE, AMYLASE in the last 168 hours. No results for input(s): AMMONIA in the last 168 hours. Coagulation Profile: Recent Labs  Lab 10/01/19 1312  INR 1.1   Cardiac Enzymes: No results for input(s): CKTOTAL, CKMB, CKMBINDEX, TROPONINI in the last 168 hours. BNP (last 3 results) No results for input(s): PROBNP in the last 8760 hours. HbA1C: No results for input(s): HGBA1C in the last 72 hours. CBG: No results for input(s): GLUCAP in the last 168 hours. Lipid Profile: No results for input(s): CHOL, HDL, LDLCALC, TRIG, CHOLHDL, LDLDIRECT in the last 72 hours. Thyroid Function Tests: No results for input(s): TSH, T4TOTAL, FREET4, T3FREE, THYROIDAB in the last 72  hours. Anemia Panel: No results for input(s): VITAMINB12, FOLATE, FERRITIN, TIBC, IRON, RETICCTPCT in the last 72 hours. Urine analysis:    Component Value Date/Time   COLORURINE YELLOW 10/01/2019 1402   APPEARANCEUR CLEAR 10/01/2019 1402   LABSPEC 1.017 10/01/2019 1402   PHURINE 6.0 10/01/2019 1402   GLUCOSEU NEGATIVE 10/01/2019 1402   HGBUR NEGATIVE 10/01/2019 1402   BILIRUBINUR NEGATIVE 10/01/2019 1402   KETONESUR NEGATIVE 10/01/2019 1402   PROTEINUR NEGATIVE 10/01/2019 1402   NITRITE NEGATIVE 10/01/2019 1402   LEUKOCYTESUR NEGATIVE 10/01/2019 1402    Radiological Exams on Admission: Mr Jeri Cos MH Contrast  Result Date: 10/01/2019 CLINICAL DATA:  Small-cell lung cancer, staging EXAM: MRI HEAD WITHOUT AND WITH CONTRAST TECHNIQUE: Multiplanar, multiecho pulse sequences of the brain and surrounding structures were obtained without and with intravenous contrast. CONTRAST:  69mL GADAVIST GADOBUTROL 1 MMOL/ML IV SOLN COMPARISON:  June 14, 2019 FINDINGS: Motion artifact is present. Brain: There is a 1.2 cm focus of restricted diffusion in the region of the lateral right thalamus/posterior limb of the internal capsule. Additional smaller focus of restricted diffusion is present in the posteroinferior right cerebellar hemisphere. There is no intracranial mass or abnormal enhancement within the above limitation. No evidence of intracranial hemorrhage. Stable prominence of the ventricles and sulci reflecting mild parenchymal volume loss. There is no hydrocephalus. Patchy and confluent areas of T2 hyperintensity in the supratentorial white matter are nonspecific but probably reflect stable chronic microvascular ischemic changes. There is no extra-axial fluid collection. Vascular: Major vessel flow voids at the skull base are preserved. Skull and upper cervical spine: Foci of abnormal T1 signal in the upper cervical spine reflecting metastatic disease. Sinuses/Orbits: Minor paranasal sinus mucosal  thickening. Left lens replacement. Other: Mastoid air cells are clear.  Sella is unremarkable. IMPRESSION: Motion degraded study demonstrates small foci of acute infarction involving the lateral right thalamus/posterior limb of the internal capsule and right cerebellar hemisphere. No evidence of intracranial metastatic disease. Cervical spine metastatic disease as seen previously. These results will be called to the ordering clinician or representative by the Radiologist Assistant, and communication documented in the PACS or zVision Dashboard. Electronically Signed   By: Macy Mis M.D.   On: 10/01/2019 08:31    EKG: Independently reviewed.   Assessment/Plan Elizjah Noblet is a 73 y.o. male with medical history significant for Stage IV Small Cell Lung Cancer (s/p 5 cycles of Tecentriq) with C-spine mets, Liver and  Bone, HTN, HLD and GERD presents after findings of two new infarcts (acute in appearance) on MRI.   # Acute right thalamic/posterior limb internal capsule and R cerebellar stroke - incidental findings on MRI, patient without reported new deficits but acute appearance of CVA.  Multifocal nature also could indicate a cardioembolic etiology but patient without any known hx of A. Fib. - Neuro-oncology, Dr. Mickeal Skinner, has reviewed imaging and noted marantic endocarditis as an etiology and patient is at risk of hypercoaguability in setting of malignancy; he does note that active cytotoxic chemo and thrombocytopenia complicate initiation of antiplatelet and/or anticoagulation - patient does not have any metastatic disease noted in the brain, current platelet count 174K  - Dr. Rory Percy, Neurology, consulted and will assess on transfer - will defer anti-platelet therapy to Neurology (and/or [facility]) - continue CVA protocol given inability to determine last known normal due to lack of symptoms but acute findings on imaging - allow permissive HTN, treat with PRN Labetalol for SBP > 220 and/or DBP > 120 (held home  amlodipine) - patient seems to have intact swallow, but will have SLP, PT and OT eval (but allow for diet now, modifications as appropriate per SLP) - started high intensity atorvastatin 80 mg qHS - Echo pending - Carotid dopplers pending  # Stage IV Small Cell Lung Ca with liver, bone/spine mets - Patient following with Oncology and now radiation oncology; continue Rx per oncology - patient is on palliative Rx with immunotherapy, now s/p 5 cycles - no brain mets on MRI - continue allopurinol (TLS)  # HTN - held amlodipine for permissive HTN, defer resumption to neuro (anticipate 24-48 hrs, but unsure of timing of event)  # HLD - Lipid profile in AM - started high intensity statin, Lipitor 80 mg  # GERD - continue PPI  # Tobacco dependence - patient cutting down, counseled  DVT prophylaxis: SCDs but would transition to chemoprophylaxis at Neuro discretion (anticipate risk while monitoring is low) Code Status: Full - per d/w patient today Family Communication: patient has updated his wife Disposition Plan: transfer to [place] called: Neurology, Dr. Rory Percy notified (Oncology service aware of patient as well - coordinated his presentation to ER) Admission status: Inpatient   Truddie Hidden MD [facility] Pager [phone]  If 7PM-7AM, please contact night-coverage [email or web address] Password [email or web address]  10/01/2019, 3:52 PM

## 2019-10-01 NOTE — Telephone Encounter (Signed)
I spoke to Jeremy Mack after being asked by medical oncology to review his recent MRI brain.    I explained that multiple strokes were revealed, and that this could represent an issue with his heart such as atrial fibrillation, valvular disease or cardiomyopathy.    Murantic endocarditis and hypercoagulability are additionally always a consideration in cancer patients in addition to routine stroke workup.  Active cytotoxic chemotherapy and recent thrombocytopenia are complicating factors for anti-platelet and/or anticoagulant therapies.  The degree of workup/monitoring requires inpatient level of care.   I advised him to visit the Emergency Department for evaluation and likely admission, which he agreed to.  [name], MD

## 2019-10-01 NOTE — Telephone Encounter (Signed)
Thank you for evaluating him so quickly, Ailee Pates

## 2019-10-01 NOTE — ED Triage Notes (Signed)
Pt reports that he has MRI yesterday before he starts cancer treatments on his brain. Reports was called today and told to go to ED for strokes that was seen on the MRI. Pt reports only deficit is little weakness in left leg.

## 2019-10-01 NOTE — Telephone Encounter (Signed)
I spoke with the patient about the findings of infarct on MRI that we were called with this am by radiology as a code critical. After speaking with Dr. Mickeal Skinner and [name], and ensuring that Jeremy Mack does not have any known symptoms, Dr. Mickeal Skinner will plan to see him for an urgent work in to assess. He will tentatively plan to come in for simulation later this week for PCI, but this may need to be rescheduled depending on Dr. Renda Rolls assessment of the patient. The patient is in agreement with this plan.

## 2019-10-01 NOTE — ED Notes (Signed)
[facility] called for transport. 

## 2019-10-01 NOTE — ED Provider Notes (Signed)
[facility] DEPT Provider Note   CSN: [id number] Arrival date & time: 10/01/19  1301     History   Chief Complaint Chief Complaint  Patient presents with   stroke seen on MRI    HPI Jeremy Mack is a 73 y.o. male.     Patient with history of lung cancer with known metastasis, hyperlipidemia, hypertension --presents to the emergency department today with stroke noted on routine MRI.  Patient was being evaluated by oncology to ensure no progression of disease.  Reportedly good response to recent chemotherapy which was about 2 and half weeks ago per the patient.  Patient is essentially asymptomatic.  He noted some mild weakness like his left leg was going to give out yesterday morning.  This has been persistent.  He has had no speech changes, vision changes or double vision, balance changes.  No other weakness in his arms or right leg.  No sensation changes including temperature sensation.  No headache, difficulty swallowing, hiccups.  Patient denies any chest pain or shortness of breath.  He is not currently on any anticoagulation.  He was sent in by his oncologist for stroke work-up today.  MRI findings as below.  MRI today:  Motion degraded study demonstrates small foci of acute infarction involving the lateral right thalamus/posterior limb of the internal capsule and right cerebellar hemisphere. No evidence of intracranial metastatic disease.     Past Medical History:  Diagnosis Date   Cancer ([place])    GERD (gastroesophageal reflux disease)    Hyperlipemia    Hypertension     Patient Active Problem List   Diagnosis Date Noted   Small cell lung cancer ([place]) 06/06/2019   Goals of care, counseling/discussion 06/06/2019   Pancreatic abscess    Malnutrition of moderate degree 06/14/2018   Pancreatic mass    Abnormal CT of the abdomen    Abdominal pain 06/12/2018   Leukocytosis 06/12/2018   AKI (acute kidney injury) ([place]) 06/12/2018    Medicare annual wellness visit, subsequent 09/09/2016   Routine general medical examination at a health care facility 09/09/2016   Hyperlipidemia 09/07/2015   Neck stiffness 05/20/2015   Hip pain, chronic 05/20/2015   Essential hypertension 05/20/2015    Past Surgical History:  Procedure Laterality Date   arm fracture surgery Right    colon polyps removed     ESOPHAGOGASTRODUODENOSCOPY (EGD) WITH PROPOFOL N/A 06/14/2018   Procedure: ESOPHAGOGASTRODUODENOSCOPY (EGD) WITH PROPOFOL;  Surgeon: [name], MD;  Location: [facility];  Service: Endoscopy;  Laterality: N/A;   EUS N/A 06/14/2018   Procedure: ESOPHAGEAL ENDOSCOPIC ULTRASOUND (EUS) RADIAL;  Surgeon: [name], MD;  Location: [facility];  Service: Endoscopy;  Laterality: N/A;   FINE NEEDLE ASPIRATION N/A 06/14/2018   Procedure: FINE NEEDLE ASPIRATION (FNA) LINEAR;  Surgeon: [name], MD;  Location: [facility];  Service: Endoscopy;  Laterality: N/A;   IR IMAGING GUIDED PORT INSERTION  08/26/2019   TONSILLECTOMY          Home Medications    Prior to Admission medications   Medication Sig Start Date End Date Taking? Authorizing Provider  allopurinol (ZYLOPRIM) 300 MG tablet TAKE 1 TABLET BY MOUTH TWICE A DAY 07/01/19   Truitt Merle, MD  amLODipine (NORVASC) 10 MG tablet Take 1 tablet (10 mg total) by mouth daily. 05/24/19   [name], FNP  aspirin-sod bicarb-citric acid (ALKA-SELTZER) 325 MG TBEF tablet Take 650 mg by mouth every 6 (six) hours as needed.  [provider]  lidocaine-prilocaine (EMLA) cream Apply 1 application topically as needed. 09/02/19   Alla Feeling, NP  nicotine (NICODERM CQ - DOSED IN MG/24 HOURS) 14 mg/24hr patch Place 1 patch (14 mg total) onto the skin daily. Patient not taking: Reported on 09/24/2019 06/06/19   Truitt Merle, MD  Omega-3 Fatty Acids (FISH OIL) 1000 MG CAPS Take 1 capsule by mouth daily.    [provider]  ondansetron  (ZOFRAN) 8 MG tablet Take 1 tablet (8 mg total) by mouth 2 (two) times daily as needed for refractory nausea / vomiting. Start on day 3 after carboplatin chemo. 06/06/19   Truitt Merle, MD  pantoprazole (PROTONIX) 20 MG tablet TAKE 1 TABLET BY MOUTH EVERY DAY 09/05/19   Alla Feeling, NP  prochlorperazine (COMPAZINE) 10 MG tablet Take 1 tablet (10 mg total) by mouth every 6 (six) hours as needed (Nausea or vomiting). 06/06/19   Truitt Merle, MD  traMADol (ULTRAM) 50 MG tablet Take 1 tablet (50 mg total) by mouth every 12 (twelve) hours as needed. 09/02/19   Alla Feeling, NP    Family History Family History  Problem Relation Age of Onset   Diabetes Mother    Diabetes Maternal Grandmother     Social History Social History   Tobacco Use   Smoking status: Current Every Day Smoker    Packs/day: 0.50    Types: Cigars   Smokeless tobacco: Never Used   Tobacco comment: Trying to quit  Substance Use Topics   Alcohol use: Yes    Alcohol/week: 0.0 standard drinks    Comment: Occasionally   Drug use: No     Allergies   Patient has no known allergies.   Review of Systems Review of Systems  Constitutional: Negative for fever.  HENT: Negative for rhinorrhea, sore throat and trouble swallowing.   Eyes: Negative for redness.  Respiratory: Negative for cough.   Cardiovascular: Negative for chest pain.  Gastrointestinal: Negative for abdominal pain, diarrhea, nausea and vomiting.  Genitourinary: Negative for dysuria.  Musculoskeletal: Negative for myalgias.  Skin: Negative for rash.  Neurological: Positive for weakness (question L leg weakess). Negative for dizziness, light-headedness and headaches.     Physical Exam Updated Vital Signs BP (!) 188/102    Pulse 93    Temp 98.7 F (37.1 C) (Oral)    Resp 19    SpO2 100%   Physical Exam Vitals signs and nursing note reviewed.  Constitutional:      Appearance: He is well-developed.  HENT:     Head: Normocephalic and atraumatic.      Right Ear: Tympanic membrane, ear canal and external ear normal.     Left Ear: Tympanic membrane, ear canal and external ear normal.     Nose: Nose normal.     Mouth/Throat:     Pharynx: Uvula midline.  Eyes:     General: Lids are normal.     Conjunctiva/sclera: Conjunctivae normal.     Pupils: Pupils are equal, round, and reactive to light.  Neck:     Musculoskeletal: Normal range of motion and neck supple.  Cardiovascular:     Rate and Rhythm: Normal rate and regular rhythm.  Pulmonary:     Effort: Pulmonary effort is normal.     Breath sounds: Normal breath sounds.  Abdominal:     Palpations: Abdomen is soft.     Tenderness: There is no abdominal tenderness.  Musculoskeletal: Normal range of motion.     Cervical back:  He exhibits normal range of motion, no tenderness and no bony tenderness.  Skin:    General: Skin is warm and dry.  Neurological:     Mental Status: He is alert and oriented to person, place, and time.     GCS: GCS eye subscore is 4. GCS verbal subscore is 5. GCS motor subscore is 6.     Cranial Nerves: No cranial nerve deficit.     Sensory: No sensory deficit.     Motor: No abnormal muscle tone.     Coordination: Coordination normal.     Gait: Gait normal.     Deep Tendon Reflexes: Reflexes are normal and symmetric.     Comments: No detectable weakness L lower extremity flexors/extensors.       ED Treatments / Results  Labs (all labs ordered are listed, but only abnormal results are displayed) Labs Reviewed  CBC - Abnormal; Notable for the following components:      Result Value   RBC 3.86 (*)    MCV 106.2 (*)    All other components within normal limits  COMPREHENSIVE METABOLIC PANEL - Abnormal; Notable for the following components:   Glucose, Bld 102 (*)    Alkaline Phosphatase 127 (*)    All other components within normal limits  RAPID URINE DRUG SCREEN, HOSP PERFORMED - Abnormal; Notable for the following components:   Tetrahydrocannabinol  POSITIVE (*)    All other components within normal limits  SARS CORONAVIRUS 2 (TAT 6-24 HRS)  PROTIME-INR  APTT  DIFFERENTIAL  URINALYSIS, ROUTINE W REFLEX MICROSCOPIC  ETHANOL    EKG EKG Interpretation  Date/Time:  Tuesday October 01 2019 13:14:15 EST Ventricular Rate:  91 PR Interval:    QRS Duration: 82 QT Interval:  389 QTC Calculation: 479 R Axis:   -35 Text Interpretation: Sinus rhythm Probable left atrial enlargement Left axis deviation Abnormal R-wave progression, early transition Borderline prolonged QT interval No significant change was found Confirmed by Ezequiel Essex [phone]) on 10/01/2019 1:21:13 PM   Radiology Mr Jeri Cos Wo Contrast  Result Date: 10/01/2019 CLINICAL DATA:  Small-cell lung cancer, staging EXAM: MRI HEAD WITHOUT AND WITH CONTRAST TECHNIQUE: Multiplanar, multiecho pulse sequences of the brain and surrounding structures were obtained without and with intravenous contrast. CONTRAST:  26mL GADAVIST GADOBUTROL 1 MMOL/ML IV SOLN COMPARISON:  June 14, 2019 FINDINGS: Motion artifact is present. Brain: There is a 1.2 cm focus of restricted diffusion in the region of the lateral right thalamus/posterior limb of the internal capsule. Additional smaller focus of restricted diffusion is present in the posteroinferior right cerebellar hemisphere. There is no intracranial mass or abnormal enhancement within the above limitation. No evidence of intracranial hemorrhage. Stable prominence of the ventricles and sulci reflecting mild parenchymal volume loss. There is no hydrocephalus. Patchy and confluent areas of T2 hyperintensity in the supratentorial white matter are nonspecific but probably reflect stable chronic microvascular ischemic changes. There is no extra-axial fluid collection. Vascular: Major vessel flow voids at the skull base are preserved. Skull and upper cervical spine: Foci of abnormal T1 signal in the upper cervical spine reflecting metastatic disease.  Sinuses/Orbits: Minor paranasal sinus mucosal thickening. Left lens replacement. Other: Mastoid air cells are clear.  Sella is unremarkable. IMPRESSION: Motion degraded study demonstrates small foci of acute infarction involving the lateral right thalamus/posterior limb of the internal capsule and right cerebellar hemisphere. No evidence of intracranial metastatic disease. Cervical spine metastatic disease as seen previously. These results will be called to the ordering clinician or  representative by the Radiologist Assistant, and communication documented in the PACS or zVision Dashboard. Electronically Signed   By: Macy Mis M.D.   On: 10/01/2019 08:31    Procedures Procedures (including critical care time)  Medications Ordered in ED Medications - No data to display   Initial Impression / Assessment and Plan / ED Course  I have reviewed the triage vital signs and the nursing notes.  Pertinent labs & imaging results that were available during my care of the patient were reviewed by me and considered in my medical decision making (see chart for details).        Patient seen and examined. Work-up initiated. Reviewed MRI findings and heme/onc notes. Will discuss plan with neuro.   Vital signs reviewed and are as follows: BP (!) 188/102    Pulse 93    Temp 98.7 F (37.1 C) (Oral)    Resp 19    SpO2 100%   2:41 PM Spoke with Dr. Rory Percy. Agrees with admit. Will need to go to [facility] to be followed by stroke team. Hospitalist paged.   2:48 PM Spoke with Dr. Andria Frames who will admit. Passed on request to admit to [facility]. Remainder of work-up is reassuring. COVID pending.   Final Clinical Impressions(s) / ED Diagnoses   Final diagnoses:  Cerebrovascular accident (CVA) due to thrombosis of precerebral artery ([place])   Admit for CVA work-up.   ED Discharge Orders    None       Carlisle Cater, PA-C 10/01/19 1450    Maudie Flakes, MD 10/03/19 [phone]

## 2019-10-01 NOTE — ED Notes (Signed)
Urinal @ bedside. Pt has been made aware that provider needs UA sample.

## 2019-10-02 ENCOUNTER — Inpatient Hospital Stay (HOSPITAL_COMMUNITY): Payer: Medicare PPO

## 2019-10-02 DIAGNOSIS — I639 Cerebral infarction, unspecified: Secondary | ICD-10-CM

## 2019-10-02 DIAGNOSIS — I10 Essential (primary) hypertension: Secondary | ICD-10-CM

## 2019-10-02 DIAGNOSIS — I6389 Other cerebral infarction: Secondary | ICD-10-CM

## 2019-10-02 DIAGNOSIS — E785 Hyperlipidemia, unspecified: Secondary | ICD-10-CM

## 2019-10-02 LAB — LIPID PANEL
Cholesterol: 222 mg/dL — ABNORMAL HIGH (ref 0–200)
HDL: 37 mg/dL — ABNORMAL LOW (ref >40)
LDL Cholesterol: 152 mg/dL — ABNORMAL HIGH (ref 0–99)
Total CHOL/HDL Ratio: 6 RATIO
Triglycerides: 163 mg/dL — ABNORMAL HIGH (ref <150)
VLDL: 33 mg/dL (ref 0–40)

## 2019-10-02 LAB — ECHOCARDIOGRAM COMPLETE
Height: 71 in
Weight: 2645.52 oz

## 2019-10-02 LAB — HEMOGLOBIN A1C
Hgb A1c MFr Bld: 4.9 % (ref 4.8–5.6)
Mean Plasma Glucose: 93.93 mg/dL

## 2019-10-02 MED ORDER — ASPIRIN EC 325 MG PO TBEC
325.0000 mg | DELAYED_RELEASE_TABLET | Freq: Every day | ORAL | Status: DC
  Administered 2019-10-02: 325 mg via ORAL
  Filled 2019-10-02: qty 1

## 2019-10-02 MED ORDER — ATORVASTATIN CALCIUM 40 MG PO TABS
40.0000 mg | ORAL_TABLET | Freq: Every day | ORAL | Status: DC
  Administered 2019-10-02: 40 mg via ORAL
  Filled 2019-10-02: qty 1

## 2019-10-02 MED ORDER — CHLORHEXIDINE GLUCONATE CLOTH 2 % EX PADS
6.0000 | MEDICATED_PAD | Freq: Every day | CUTANEOUS | Status: DC
  Administered 2019-10-02 – 2019-10-03 (×2): 6 via TOPICAL

## 2019-10-02 MED ORDER — APIXABAN 5 MG PO TABS
5.0000 mg | ORAL_TABLET | Freq: Two times a day (BID) | ORAL | Status: DC
  Administered 2019-10-02 – 2019-10-03 (×2): 5 mg via ORAL
  Filled 2019-10-02 (×2): qty 1

## 2019-10-02 MED ORDER — GUAIFENESIN-DM 100-10 MG/5ML PO SYRP: 5.0000 mL | ORAL_SOLUTION | ORAL | Status: DC | PRN

## 2019-10-02 NOTE — Progress Notes (Signed)
PROGRESS NOTE    Jeremy Mack  RCB:[id number] DOB: 05-16-1946 DOA: 10/01/2019 PCP: [name], FNP   Brief Narrative:  73 year old male with a stage IV SCC of lung status post 5 cycles of Tecentriq, and C-spine meds liver and bone mets, history of hypertension, hyperlipidemia, GERD who underwent MRI to assess for brain mets in preparation for possible need for palliative radiation and found to have acute infarcts and sent to the hospital for further management.  Ports left lower extremity feeling weaker in past 1 to 2 weeks.  Smokes 2-3 drinks every day occasional alcohol use no drug use lives home with wife. Patient is admitted for further management.  Subjective: Resting comfortably. No acute events overnight. Some weakness on LLE  Assessment & Plan:   Acute ischemic stroke right thalamic/post limb internal capsule and Rt cerebellar stroke: NEURO consult appreciated. ldl 152, hba1c <5. Suspect due to hypercoagulable from cancer. F/u MRA, PT/OT, TTE, CAROTID doppler, permissive htn, risk modifications.Cont asa dose  as ordered by neurology.    Stage IV SCC of ung with liver/bone/spine mets followed by oncology and radiation oncology continue outpatient follow-up.  No meds seen in the MRI.  Continue allopurinol for TLS  Hypertension: Hold amlodipine and allow permissive hypertension resume once-resume once okay with neurology  HLD: Lipid profile LDL 152 poorly controlled started high intensity statin  GERD: Stable on PPI.  Tobacco dependence: Cessation advised.  Body mass index is 23.06 kg/m.    DVT prophylaxis: SCD Code Status: full Family Communication: plan of care discussed with patient at bedside. Disposition Plan: Remains inpatient pending completion of stroke work-up PT OT evaluation.    Consultants:neuro   Procedures:  MRI: Motion degraded study demonstrates small foci of acute infarction involving the lateral right thalamus/posterior limb of the  internal capsule and right cerebellar hemisphere. No evidence of intracranial metastatic disease.  Cervical spine metastatic disease as seen previously.  MRA Angio IMPRESSION: 1. Negative for large vessel occlusion. No posterior circulation stenosis, although there is mild irregularity of the right PCA in the region of the right thalamostriate artery. 2. Non dominant right ICA siphon with atherosclerosis and up to moderate right siphon stenosis in the cavernous segment. 3. Otherwise negative anterior circulation.  Microbiology:  Antimicrobials: Anti-infectives (From admission, onward)   None       Objective: Vitals:   10/02/19 0330 10/02/19 0400 10/02/19 0430 10/02/19 0600  BP: 118/68 119/76 108/78 (!) 163/95  Pulse: 61 (!) 59 62 67  Resp: 18 17 16 13   Temp:    98.7 F (37.1 C)  TempSrc:    Oral  SpO2: 99% 99% 100%   Weight:    75 kg  Height:    5\' 11"  (1.803 m)   No intake or output data in the 24 hours ending 10/02/19 0730 Filed Weights   10/02/19 0600  Weight: 75 kg   Weight change:   Body mass index is 23.06 kg/m.  Intake/Output from previous day: No intake/output data recorded. Intake/Output this shift: No intake/output data recorded.  Examination:  General exam: AAO,NAD, Weak appearing. HEENT:Oral mucosa moist, Ear/Nose WNL grossly, dentition normal. Respiratory system: Diminished at the base,no wheezing or crackles,no use of accessory muscle Cardiovascular system: S1 & S2 +, No JVD,. Gastrointestinal system: Abdomen soft, NT,ND, BS+ Nervous System:Alert, awake, moving extremities and mild weakness on LLE Extremities: No edema, distal peripheral pulses palpable.  Skin: No rashes,no icterus. MSK: Normal muscle bulk,tone, power  Medications:  Scheduled Meds:  allopurinol  300  mg Oral BID   atorvastatin  80 mg Oral q1800   Chlorhexidine Gluconate Cloth  6 each Topical Daily   omega-3 acid ethyl esters  1 g Oral Daily   pantoprazole  20 mg  Oral Daily   Continuous Infusions:  Data Reviewed: I have personally reviewed following labs and imaging studies  CBC: Recent Labs  Lab 10/01/19 1312  WBC 6.4  NEUTROABS 3.7  HGB 13.1  HCT 41.0  MCV 106.2*  PLT 749   Basic Metabolic Panel: Recent Labs  Lab 10/01/19 1312  NA 139  K 3.8  CL 106  CO2 22  GLUCOSE 102*  BUN 12  CREATININE 1.08  CALCIUM 9.2   GFR: Estimated Creatinine Clearance: 66.6 mL/min (by C-G formula based on SCr of 1.08 mg/dL). Liver Function Tests: Recent Labs  Lab 10/01/19 1312  AST 18  ALT 13  ALKPHOS 127*  BILITOT 0.7  PROT 8.1  ALBUMIN 4.2   No results for input(s): LIPASE, AMYLASE in the last 168 hours. No results for input(s): AMMONIA in the last 168 hours. Coagulation Profile: Recent Labs  Lab 10/01/19 1312  INR 1.1   Cardiac Enzymes: No results for input(s): CKTOTAL, CKMB, CKMBINDEX, TROPONINI in the last 168 hours. BNP (last 3 results) No results for input(s): PROBNP in the last 8760 hours. HbA1C: No results for input(s): HGBA1C in the last 72 hours. CBG: No results for input(s): GLUCAP in the last 168 hours. Lipid Profile: Recent Labs    10/02/19 0443  CHOL 222*  HDL 37*  LDLCALC 152*  TRIG 163*  CHOLHDL 6.0   Thyroid Function Tests: No results for input(s): TSH, T4TOTAL, FREET4, T3FREE, THYROIDAB in the last 72 hours. Anemia Panel: No results for input(s): VITAMINB12, FOLATE, FERRITIN, TIBC, IRON, RETICCTPCT in the last 72 hours. Sepsis Labs: No results for input(s): PROCALCITON, LATICACIDVEN in the last 168 hours.  Recent Results (from the past 240 hour(s))  SARS CORONAVIRUS 2 (TAT 6-24 HRS) Nasopharyngeal Nasopharyngeal Swab     Status: None   Collection Time: 10/01/19  1:45 PM   Specimen: Nasopharyngeal Swab  Result Value Ref Range Status   SARS Coronavirus 2 NEGATIVE NEGATIVE Final    Comment: (NOTE) SARS-CoV-2 target nucleic acids are NOT DETECTED. The SARS-CoV-2 RNA is generally detectable in upper  and lower respiratory specimens during the acute phase of infection. Negative results do not preclude SARS-CoV-2 infection, do not rule out co-infections with other pathogens, and should not be used as the sole basis for treatment or other patient management decisions. Negative results must be combined with clinical observations, patient history, and epidemiological information. The expected result is Negative. Fact Sheet for Patients: SugarRoll.be Fact Sheet for Healthcare Providers: https://www.woods-mathews.com/ This test is not yet approved or cleared by the [place] FDA and  has been authorized for detection and/or diagnosis of SARS-CoV-2 by FDA under an Emergency Use Authorization (EUA). This EUA will remain  in effect (meaning this test can be used) for the duration of the COVID-19 declaration under Section 56 4(b)(1) of the Act, 21 U.S.C. section 360bbb-3(b)(1), unless the authorization is terminated or revoked sooner. Performed at [facility], [place] [address] [place], [place] [address]       Radiology Studies: Mr Jeri Cos Wo Contrast  Result Date: 10/01/2019 CLINICAL DATA:  Small-cell lung cancer, staging EXAM: MRI HEAD WITHOUT AND WITH CONTRAST TECHNIQUE: Multiplanar, multiecho pulse sequences of the brain and surrounding structures were obtained without and with intravenous contrast. CONTRAST:  67mL  GADAVIST GADOBUTROL 1 MMOL/ML IV SOLN COMPARISON:  June 14, 2019 FINDINGS: Motion artifact is present. Brain: There is a 1.2 cm focus of restricted diffusion in the region of the lateral right thalamus/posterior limb of the internal capsule. Additional smaller focus of restricted diffusion is present in the posteroinferior right cerebellar hemisphere. There is no intracranial mass or abnormal enhancement within the above limitation. No evidence of intracranial hemorrhage. Stable prominence of the ventricles and sulci reflecting mild  parenchymal volume loss. There is no hydrocephalus. Patchy and confluent areas of T2 hyperintensity in the supratentorial white matter are nonspecific but probably reflect stable chronic microvascular ischemic changes. There is no extra-axial fluid collection. Vascular: Major vessel flow voids at the skull base are preserved. Skull and upper cervical spine: Foci of abnormal T1 signal in the upper cervical spine reflecting metastatic disease. Sinuses/Orbits: Minor paranasal sinus mucosal thickening. Left lens replacement. Other: Mastoid air cells are clear.  Sella is unremarkable. IMPRESSION: Motion degraded study demonstrates small foci of acute infarction involving the lateral right thalamus/posterior limb of the internal capsule and right cerebellar hemisphere. No evidence of intracranial metastatic disease. Cervical spine metastatic disease as seen previously. These results will be called to the ordering clinician or representative by the Radiologist Assistant, and communication documented in the PACS or zVision Dashboard. Electronically Signed   By: Macy Mis M.D.   On: 10/01/2019 08:31      LOS: 1 day   Time spent: More than 50% of that time was spent in counseling and/or coordination of care.  Antonieta Pert, MD [facility]  10/02/2019, 7:30 AM

## 2019-10-02 NOTE — Evaluation (Signed)
Speech Language Pathology Evaluation Patient Details Name: Benno Brensinger MRN: [id number] DOB: 12/15/1945 Today's Date: 10/02/2019 Time: 1607-3710 SLP Time Calculation (min) (ACUTE ONLY): 45 min  Problem List:  Patient Active Problem List   Diagnosis Date Noted  . Acute ischemic stroke ([place]) 10/01/2019  . Small cell lung cancer ([place]) 06/06/2019  . Goals of care, counseling/discussion 06/06/2019  . Pancreatic abscess   . Malnutrition of moderate degree 06/14/2018  . Pancreatic mass   . Abnormal CT of the abdomen   . Abdominal pain 06/12/2018  . Leukocytosis 06/12/2018  . AKI (acute kidney injury) ([place]) 06/12/2018  . Medicare annual wellness visit, subsequent 09/09/2016  . Routine general medical examination at a health care facility 09/09/2016  . Hyperlipidemia 09/07/2015  . Neck stiffness 05/20/2015  . Hip pain, chronic 05/20/2015  . Essential hypertension 05/20/2015   Past Medical History:  Past Medical History:  Diagnosis Date  . Cancer ([employer])   . GERD (gastroesophageal reflux disease)   . Hyperlipemia   . Hypertension    Past Surgical History:  Past Surgical History:  Procedure Laterality Date  . arm fracture surgery Right   . colon polyps removed    . ESOPHAGOGASTRODUODENOSCOPY (EGD) WITH PROPOFOL N/A 06/14/2018   Procedure: ESOPHAGOGASTRODUODENOSCOPY (EGD) WITH PROPOFOL;  Surgeon: [name], MD;  Location: [facility];  Service: Endoscopy;  Laterality: N/A;  . EUS N/A 06/14/2018   Procedure: ESOPHAGEAL ENDOSCOPIC ULTRASOUND (EUS) RADIAL;  Surgeon: [name], MD;  Location: [facility];  Service: Endoscopy;  Laterality: N/A;  . FINE NEEDLE ASPIRATION N/A 06/14/2018   Procedure: FINE NEEDLE ASPIRATION (FNA) LINEAR;  Surgeon: [name], MD;  Location: [facility];  Service: Endoscopy;  Laterality: N/A;  . IR IMAGING GUIDED PORT INSERTION  08/26/2019  . TONSILLECTOMY     HPI:  73 yo male with stage 4 lung cancer undergoing chemo and to start brain  radiation soon per pt. Pt MRI showed right thalamic and cerebellar CVA .  Pt passed [employer].  Pt also with h/o HTN, HLD, GERD, cspine, liver and bone mets.  Pt was transferred to [facility] due to CVAs.   Assessment / Plan / Recommendation Clinical Impression  MOCA 7.3 administered and he scored 24/30 - with normal score being 26 or above.  He demonstrated strengths in areas of attention, language and recall.  Mental and written math were challenging for pt. No dysarthria or language deficits.  Pt reports he and his wife work "together" in managing home duties.  Pt denies any changes with acute strokes.  Educated him to findings of test and strengths, challenges, etc.  No SLP follow up indicated. Thanks for this consult.    SLP Assessment  SLP Recommendation/Assessment: Patient does not need any further Speech Lanaguage Pathology Services SLP Visit Diagnosis: Dysarthria and anarthria (R47.1)    Follow Up Recommendations  None    Frequency and Duration     n/a      SLP Evaluation Cognition  Overall Cognitive Status: Within Functional Limits for tasks assessed Orientation Level: Oriented X4(with use of digital clock in room) Attention: Sustained;Selective Sustained Attention: Appears intact Selective Attention: Appears intact Memory: Impaired Memory Impairment: Retrieval deficit(recalled 3/5 words I, 2/5 with category or multiple choice cues) Awareness: Appears intact Problem Solving: Appears intact(trying to pull down bedside arm to use his urinal as he states the urine "comes fast") Safety/Judgment: Appears intact       Comprehension  Auditory Comprehension Overall Auditory Comprehension: Appears within functional limits for tasks  assessed Yes/No Questions: Not tested Commands: Within Functional Limits Conversation: Complex Visual Recognition/Discrimination Discrimination: Within Function Limits Reading Comprehension Reading Status: (pt able to read the clock)    Expression  Expression Primary Mode of Expression: Verbal Verbal Expression Overall Verbal Expression: Appears within functional limits for tasks assessed Initiation: No impairment Repetition: No impairment Naming: No impairment Pragmatics: No impairment Written Expression Dominant Hand: Right Written Expression: Within Functional Limits   Oral / Motor  Oral Motor/Sensory Function Overall Oral Motor/Sensory Function: Within functional limits Motor Speech Overall Motor Speech: Appears within functional limits for tasks assessed Respiration: Within functional limits Resonance: Within functional limits Articulation: Within functional limitis Intelligibility: Intelligible Motor Planning: Witnin functional limits   GO                    Macario Golds 10/02/2019, 11:54 AM   Luanna Salk, MS [facility] SLP Acute Rehab Services Pager [phone] Office [phone]

## 2019-10-02 NOTE — Progress Notes (Signed)
Occupational Therapy Evaluation Patient Details Name: Jeremy Mack MRN: [id number] DOB: 1946/06/19 Today's Date: 10/02/2019    History of Present Illness 73 y.o. male with medical history significant for Stage IV Small Cell Lung Cancer (s/p 5 cycles of Tecentriq) with C-spine mets, Liver and Bone, HTN, HLD and GERD presents after findings of two new infarcts on MRI (09/30/19) involving the lateral right thalamus/posterior limb of the interna capsule and right cerebellar hemisphere.   Clinical Impression   PTA, pt lived in one story home with his wife and was independent for all ADLs and IADLs, reporting that he cooks and pays the bills at home. Pt currently presents with decreased attention, problem solving, STM, safety, and awareness of deficits. Pt performed oral care standing at sink with supervision, however required min VCs to locate toothpaste that was sitting on the counter, and attempted to walk away leaving the water running. Pt performed BADLs with supervision for safety. Pt would benefit from continued OT acutely to address deficits and facilitate safe dc. Recommend dc home and Outpatient Neuro OT to address cognitive deficits and increase safe performance of ADLs and IADLs.     Follow Up Recommendations  Outpatient OT;Supervision/Assistance - 24 hour(OP neuro OT)    Equipment Recommendations  None recommended by OT    Recommendations for Other Services       Precautions / Restrictions Precautions Precautions: Fall Restrictions Weight Bearing Restrictions: No      Mobility Bed Mobility Overal bed mobility: Modified Independent             General bed mobility comments: pt required no assist to come to EOB  Transfers Overall transfer level: Needs assistance Equipment used: None Transfers: Sit to/from Stand Sit to Stand: Supervision         General transfer comment: supervision for safety    Balance Overall balance assessment: Mild deficits observed, not  formally tested                                         ADL either performed or assessed with clinical judgement   ADL Overall ADL's : Needs assistance/impaired Eating/Feeding: Modified independent;Sitting Eating/Feeding Details (indicate cue type and reason): Pt received food tray at end of session, and began to talk after taking a drink and spit all the drink out of his mouth onto his lap. Grooming: Oral care;Supervision/safety;Cueing for sequencing;Standing Grooming Details (indicate cue type and reason): Pt required min VCs to locate toothpaste, supervision for safety Upper Body Bathing: Supervision/ safety;Sitting   Lower Body Bathing: Supervison/ safety;Sit to/from stand   Upper Body Dressing : Supervision/safety;Sitting   Lower Body Dressing: Supervision/safety;Sit to/from stand   Toilet Transfer: Supervision/safety;Ambulation(simulated to recliner)   Toileting- Clothing Manipulation and Hygiene: Supervision/safety;Sit to/from stand       Functional mobility during ADLs: Supervision/safety General ADL Comments: Pt required supervision for all BADLs, requiring min VCs to problem solve location of oral care items     Vision Baseline Vision/History: Cataracts(reports prior cataract surgery) Patient Visual Report: No change from baseline Vision Assessment?: No apparent visual deficits     Perception     Praxis      Pertinent Vitals/Pain Pain Assessment: No/denies pain     Hand Dominance Right   Extremity/Trunk Assessment Upper Extremity Assessment Upper Extremity Assessment: Overall WFL for tasks assessed   Lower Extremity Assessment Lower Extremity Assessment: Overall WFL for tasks assessed  Cervical / Trunk Assessment Cervical / Trunk Assessment: Normal   Communication Communication Communication: No difficulties   Cognition Arousal/Alertness: Awake/alert Behavior During Therapy: WFL for tasks assessed/performed Overall Cognitive  Status: Impaired/Different from baseline Area of Impairment: Memory;Following commands;Safety/judgement;Awareness;Problem solving;Attention                   Current Attention Level: Selective Memory: Decreased short-term memory Following Commands: Follows one step commands consistently;Follows one step commands with increased time Safety/Judgement: Decreased awareness of safety;Decreased awareness of deficits Awareness: Emergent Problem Solving: Slow processing;Difficulty sequencing;Requires verbal cues;Requires tactile cues General Comments: Pt presenting with decreased awareness and problem solving skills as seen by looking for toothpaste and then moving the toothpaste out of the way, as well as leaving the water running and attempting to walk away from the sink. Pt demonstrated decreased STM as seen by recalling 0/3 words, and required max VCs for simple money management. At end of session pt took sip of his drink and then began to speak and spit his drink out onto his lap.   General Comments  VSS throughout    Exercises     Shoulder Instructions      Home Living Family/patient expects to be discharged to:: Private residence Living Arrangements: Spouse/significant other Available Help at Discharge: Family;Available 24 hours/day Type of Home: House Home Access: Stairs to enter [employer] of Steps: 1 Entrance Stairs-Rails: None Home Layout: One level     Bathroom Shower/Tub: Occupational psychologist: Standard     Home Equipment: Bedside commode;Shower seat;Adaptive equipment;Walker - 2 wheels;Cane - single point [employer] Equipment: Reacher    Lives With: Spouse    Prior Functioning/Environment Level of Independence: Independent        Comments: PTA, pt was independent for ADLs and IADLs. Pt reports cooking and paying bills at home        OT Problem List: Impaired balance (sitting and/or standing);Decreased safety awareness;Decreased  cognition      OT Treatment/Interventions: Self-care/ADL training;Therapeutic activities;Cognitive remediation/compensation;Patient/family education;Balance training    OT Goals(Current goals can be found in the care plan section) Acute Rehab OT Goals Patient Stated Goal: go home OT Goal Formulation: With patient Time For Goal Achievement: 10/16/19 Potential to Achieve Goals: Good  OT Frequency: Min 3X/week   Barriers to D/C:            Co-evaluation              AM-PAC OT "6 Clicks" Daily Activity     Outcome Measure Help from another person eating meals?: None Help from another person taking care of personal grooming?: None Help from another person toileting, which includes using toliet, bedpan, or urinal?: None Help from another person bathing (including washing, rinsing, drying)?: None Help from another person to put on and taking off regular upper body clothing?: None Help from another person to put on and taking off regular lower body clothing?: None 6 Click Score: 24   End of Session Equipment Utilized During Treatment: Gait belt Nurse Communication: Mobility status  Activity Tolerance: Patient tolerated treatment well Patient left: in chair;with call bell/phone within reach;with chair alarm set  OT Visit Diagnosis: Muscle weakness (generalized) (M62.81);Other symptoms and signs involving cognitive function                Time: 4332-9518 OT Time Calculation (min): 30 min Charges:  OT General Charges $OT Visit: 1 Visit OT Evaluation $OT Eval Moderate Complexity: 1 Mod OT Treatments $Self Care/Home Management :  8-22 mins  Gus Rankin, OT Student  Gus Rankin 10/02/2019, 5:04 PM

## 2019-10-02 NOTE — Progress Notes (Signed)
Carotid and bilateral lower extremity venous duplex complete. Please see CV Proc tab for preliminary results. [place], RVT 4:14 PM  10/02/2019

## 2019-10-02 NOTE — ED Notes (Signed)
This nurse has attempted to call report to [facility] 3W twice, and has been told that the nurse receiving the pt will call me back when possible

## 2019-10-02 NOTE — Consult Note (Signed)
Referring Physician: Dr. Andria Frames    Chief Complaint: LLE weakness  HPI: Jeremy Mack is an 73 y.o. male with stage IV small cell lung cancer, s/p chemotherapy, with cervical spine, liver and bone metastases, who presented to [facility] ED yesterday after an MRI brain to evaluate for possible intracranial metastases revealed two acute ischemic infarctions, one in the right thalamus and one in the right cerebellar hemisphere. He states that the day before having the MRI, his LLE became weak while walking. He only notices the weakness while ambulating. He denies any sensory symptoms or motor symptoms involving his face or upper extremity. Denies vision changes or speech difficulty. No CP or abdominal pain. No confusion. Has had some coughing lately.   MRI brain: Motion degraded study demonstrates small foci of acute infarction involving the lateral right thalamus/posterior limb of the internal capsule and right cerebellar hemisphere. No evidence of intracranial metastatic disease. Cervical spine metastatic disease as seen previously.   LSN: Sunday tPA Given: No: Out of time window.   Past Medical History:  Diagnosis Date  . Cancer ([place])   . GERD (gastroesophageal reflux disease)   . Hyperlipemia   . Hypertension     Past Surgical History:  Procedure Laterality Date  . arm fracture surgery Right   . colon polyps removed    . ESOPHAGOGASTRODUODENOSCOPY (EGD) WITH PROPOFOL N/A 06/14/2018   Procedure: ESOPHAGOGASTRODUODENOSCOPY (EGD) WITH PROPOFOL;  Surgeon: [name], MD;  Location: [facility];  Service: Endoscopy;  Laterality: N/A;  . EUS N/A 06/14/2018   Procedure: ESOPHAGEAL ENDOSCOPIC ULTRASOUND (EUS) RADIAL;  Surgeon: [name], MD;  Location: [facility];  Service: Endoscopy;  Laterality: N/A;  . FINE NEEDLE ASPIRATION N/A 06/14/2018   Procedure: FINE NEEDLE ASPIRATION (FNA) LINEAR;  Surgeon: [name], MD;  Location: [facility];  Service: Endoscopy;  Laterality: N/A;   . IR IMAGING GUIDED PORT INSERTION  08/26/2019  . TONSILLECTOMY      Family History  Problem Relation Age of Onset  . Diabetes Mother   . Diabetes Maternal Grandmother    Social History:  reports that he has been smoking cigars. He has been smoking about 0.50 packs per day. He has never used smokeless tobacco. He reports current alcohol use. He reports that he does not use drugs.  Allergies: No Known Allergies  Medications:  No current facility-administered medications on file prior to encounter.    Current Outpatient Medications on File Prior to Encounter  Medication Sig Dispense Refill  . allopurinol (ZYLOPRIM) 300 MG tablet TAKE 1 TABLET BY MOUTH TWICE A DAY (Patient taking differently: Take 300 mg by mouth 2 (two) times daily. ) 60 tablet 0  . amLODipine (NORVASC) 10 MG tablet Take 1 tablet (10 mg total) by mouth daily. 90 tablet 0  . aspirin 325 MG tablet Take 650 mg by mouth every 4 (four) hours as needed for mild pain or headache.    . lidocaine-prilocaine (EMLA) cream Apply 1 application topically as needed. (Patient taking differently: Apply 1 application topically as needed (port access). ) 30 g 3  . Omega-3 Fatty Acids (FISH OIL) 1000 MG CAPS Take 1,000 mg by mouth daily.     . ondansetron (ZOFRAN) 8 MG tablet Take 1 tablet (8 mg total) by mouth 2 (two) times daily as needed for refractory nausea / vomiting. Start on day 3 after carboplatin chemo. 30 tablet 1  . pantoprazole (PROTONIX) 20 MG tablet TAKE 1 TABLET BY MOUTH EVERY DAY (Patient taking differently:  Take 20 mg by mouth daily. ) 30 tablet 2  . polyvinyl alcohol (LIQUIFILM TEARS) 1.4 % ophthalmic solution Place 1 drop into both eyes as needed for dry eyes.    Marland Kitchen prochlorperazine (COMPAZINE) 10 MG tablet Take 1 tablet (10 mg total) by mouth every 6 (six) hours as needed (Nausea or vomiting). 30 tablet 1  . traMADol (ULTRAM) 50 MG tablet Take 1 tablet (50 mg total) by mouth every 12 (twelve) hours as needed. (Patient  taking differently: Take 50 mg by mouth every 12 (twelve) hours as needed for moderate pain. ) 30 tablet 0  . nicotine (NICODERM CQ - DOSED IN MG/24 HOURS) 14 mg/24hr patch Place 1 patch (14 mg total) onto the skin daily. (Patient not taking: Reported on 09/24/2019) 28 patch 0    ROS: Persistent cough. Other ROS as per HPI with comprehensive ROS otherwise negative.   Physical Examination: Blood pressure 108/78, pulse 62, temperature 98.7 F (37.1 C), temperature source Oral, resp. rate 16, height 5\' 11"  (1.803 m), weight 75 kg, SpO2 100 %.  HEENT: [place]/AT Lungs: Intermittent cough Ext: No edema  Neurologic Examination: Mental Status: Alert, oriented, thought content appropriate.  Speech fluent without evidence of aphasia.  Able to follow all commands without difficulty. Cranial Nerves: II:  Visual fields grossly intact with no extinction to DSS. PERRL.  III,IV, VI: No ptosis. EOMI with no nystagmus.  V,VII: Temp sensation equal bilaterally. Face symmetric.  VIII: hearing intact to voice IX,X: No hypophonia.  XI: Symmetric  XII: Midline tongue extension  Motor: RUE and RLE 5/5 LUE 5/5 LLE 4/5 knee flexion, hip adduction and abduction, otherwise 5/5 Sensory: Temp and light touch intact throughout, bilaterally. No extinction to DSS.  Deep Tendon Reflexes:  3+ bilateral upper extremities, slightly more brisk on the left 2+ bilateral pateallae 0 achilles bilaterally Plantars: Right: downgoing  Left: downgoing Cerebellar: No ataxia with FNF bilaterally Gait: Deferred  Results for orders placed or performed during the hospital encounter of 10/01/19 (from the past 48 hour(s))  Protime-INR     Status: None   Collection Time: 10/01/19  1:12 PM  Result Value Ref Range   Prothrombin Time 14.3 11.4 - 15.2 seconds   INR 1.1 0.8 - 1.2    Comment: (NOTE) INR goal varies based on device and disease states. Performed at [facility], [place] [address] [place], [place]  [address]   APTT     Status: None   Collection Time: 10/01/19  1:12 PM  Result Value Ref Range   aPTT 32 24 - 36 seconds    Comment: Performed at [facility], [place] [address] [place], [place] [address]  CBC     Status: Abnormal   Collection Time: 10/01/19  1:12 PM  Result Value Ref Range   WBC 6.4 4.0 - 10.5 K/uL   RBC 3.86 (L) 4.22 - 5.81 MIL/uL   Hemoglobin 13.1 13.0 - 17.0 g/dL   HCT 41.0 39.0 - 52.0 %   MCV 106.2 (H) 80.0 - 100.0 fL   MCH 33.9 26.0 - 34.0 pg   MCHC 32.0 30.0 - 36.0 g/dL   RDW 15.1 11.5 - 15.5 %   Platelets 174 150 - 400 K/uL   nRBC 0.0 0.0 - 0.2 %    Comment: Performed at [facility], [place] [address] [place], [place] [address]  Differential     Status: None   Collection Time: 10/01/19  1:12 PM  Result Value Ref Range  Neutrophils Relative % 58 %   Neutro Abs 3.7 1.7 - 7.7 K/uL   Lymphocytes Relative 34 %   Lymphs Abs 2.2 0.7 - 4.0 K/uL   Monocytes Relative 6 %   Monocytes Absolute 0.4 0.1 - 1.0 K/uL   Eosinophils Relative 1 %   Eosinophils Absolute 0.1 0.0 - 0.5 K/uL   Basophils Relative 1 %   Basophils Absolute 0.1 0.0 - 0.1 K/uL   Immature Granulocytes 0 %   Abs Immature Granulocytes 0.01 0.00 - 0.07 K/uL    Comment: Performed at [facility] [place] [facility], [place] [address] [place], [place] [address]  Comprehensive metabolic panel     Status: Abnormal   Collection Time: 10/01/19  1:12 PM  Result Value Ref Range   Sodium 139 135 - 145 mmol/L   Potassium 3.8 3.5 - 5.1 mmol/L   Chloride 106 98 - 111 mmol/L   CO2 22 22 - 32 mmol/L   Glucose, Bld 102 (H) 70 - 99 mg/dL   BUN 12 8 - 23 mg/dL   Creatinine, Ser 1.08 0.61 - 1.24 mg/dL   Calcium 9.2 8.9 - 10.3 mg/dL   Total Protein 8.1 6.5 - 8.1 g/dL   Albumin 4.2 3.5 - 5.0 g/dL   AST 18 15 - 41 U/L   ALT 13 0 - 44 U/L   Alkaline Phosphatase 127 (H) 38 - 126 U/L   Total Bilirubin 0.7 0.3 - 1.2 mg/dL   GFR calc non Af Amer >60 >60 mL/min   GFR calc Af Amer >60  >60 mL/min   Anion gap 11 5 - 15    Comment: Performed at [facility], [place] [address] [place], [place] [address]  Ethanol     Status: None   Collection Time: 10/01/19  1:12 PM  Result Value Ref Range   Alcohol, Ethyl (B) <10 <10 mg/dL    Comment: (NOTE) Lowest detectable limit for serum alcohol is 10 mg/dL. For medical purposes only. Performed at [facility], [place] [address], [place], [place] [address]   SARS CORONAVIRUS 2 (TAT 6-24 HRS) Nasopharyngeal Nasopharyngeal Swab     Status: None   Collection Time: 10/01/19  1:45 PM   Specimen: Nasopharyngeal Swab  Result Value Ref Range   SARS Coronavirus 2 NEGATIVE NEGATIVE    Comment: (NOTE) SARS-CoV-2 target nucleic acids are NOT DETECTED. The SARS-CoV-2 RNA is generally detectable in upper and lower respiratory specimens during the acute phase of infection. Negative results do not preclude SARS-CoV-2 infection, do not rule out co-infections with other pathogens, and should not be used as the sole basis for treatment or other patient management decisions. Negative results must be combined with clinical observations, patient history, and epidemiological information. The expected result is Negative. Fact Sheet for Patients: SugarRoll.be Fact Sheet for Healthcare Providers: https://www.woods-mathews.com/ This test is not yet approved or cleared by the [place] FDA and  has been authorized for detection and/or diagnosis of SARS-CoV-2 by FDA under an Emergency Use Authorization (EUA). This EUA will remain  in effect (meaning this test can be used) for the duration of the COVID-19 declaration under Section 56 4(b)(1) of the Act, 21 U.S.C. section 360bbb-3(b)(1), unless the authorization is terminated or revoked sooner. Performed at [facility], [place] [address], [place], [place] [address]   Urinalysis, Routine w reflex microscopic     Status:  None   Collection Time: 10/01/19  2:02 PM  Result Value Ref Range   Color,  Urine YELLOW YELLOW   APPearance CLEAR CLEAR   Specific Gravity, Urine 1.017 1.005 - 1.030   pH 6.0 5.0 - 8.0   Glucose, UA NEGATIVE NEGATIVE mg/dL   Hgb urine dipstick NEGATIVE NEGATIVE   Bilirubin Urine NEGATIVE NEGATIVE   Ketones, ur NEGATIVE NEGATIVE mg/dL   Protein, ur NEGATIVE NEGATIVE mg/dL   Nitrite NEGATIVE NEGATIVE   Leukocytes,Ua NEGATIVE NEGATIVE    Comment: Performed at [facility] [address], [place], [place] [address]  Urine rapid drug screen (hosp performed)     Status: Abnormal   Collection Time: 10/01/19  2:02 PM  Result Value Ref Range   Opiates NONE DETECTED NONE DETECTED   Cocaine NONE DETECTED NONE DETECTED   Benzodiazepines NONE DETECTED NONE DETECTED   Amphetamines NONE DETECTED NONE DETECTED   Tetrahydrocannabinol POSITIVE (A) NONE DETECTED   Barbiturates NONE DETECTED NONE DETECTED    Comment: (NOTE) DRUG SCREEN FOR MEDICAL PURPOSES ONLY.  IF CONFIRMATION IS NEEDED FOR ANY PURPOSE, NOTIFY LAB WITHIN 5 DAYS. LOWEST DETECTABLE LIMITS FOR URINE DRUG SCREEN Drug Class                     Cutoff (ng/mL) Amphetamine and metabolites    1000 Barbiturate and metabolites    200 Benzodiazepine                 542 Tricyclics and metabolites     300 Opiates and metabolites        300 Cocaine and metabolites        300 THC                            50 Performed at [facility], [place] [address] [place], [place] [address]   Lipid panel     Status: Abnormal   Collection Time: 10/02/19  4:43 AM  Result Value Ref Range   Cholesterol 222 (H) 0 - 200 mg/dL   Triglycerides 163 (H) <150 mg/dL   HDL 37 (L) >40 mg/dL   Total CHOL/HDL Ratio 6.0 RATIO   VLDL 33 0 - 40 mg/dL   LDL Cholesterol 152 (H) 0 - 99 mg/dL    Comment:        Total Cholesterol/HDL:CHD Risk Coronary Heart Disease Risk Table                     Men   Women  1/2 Average Risk    3.4   3.3  Average Risk       5.0   4.4  2 X Average Risk   9.6   7.1  3 X Average Risk  23.4   11.0        Use the calculated Patient Ratio above and the CHD Risk Table to determine the patient's CHD Risk.        ATP III CLASSIFICATION (LDL):  <100     mg/dL   Optimal  100-129  mg/dL   Near or Above                    Optimal  130-159  mg/dL   Borderline  160-189  mg/dL   High  >190     mg/dL   Very High Performed at [facility] [address] [place], [place] [address]    Mr Jeri Cos TD Contrast  Result Date: 10/01/2019 CLINICAL DATA:  Small-cell lung  cancer, staging EXAM: MRI HEAD WITHOUT AND WITH CONTRAST TECHNIQUE: Multiplanar, multiecho pulse sequences of the brain and surrounding structures were obtained without and with intravenous contrast. CONTRAST:  75mL GADAVIST GADOBUTROL 1 MMOL/ML IV SOLN COMPARISON:  June 14, 2019 FINDINGS: Motion artifact is present. Brain: There is a 1.2 cm focus of restricted diffusion in the region of the lateral right thalamus/posterior limb of the internal capsule. Additional smaller focus of restricted diffusion is present in the posteroinferior right cerebellar hemisphere. There is no intracranial mass or abnormal enhancement within the above limitation. No evidence of intracranial hemorrhage. Stable prominence of the ventricles and sulci reflecting mild parenchymal volume loss. There is no hydrocephalus. Patchy and confluent areas of T2 hyperintensity in the supratentorial white matter are nonspecific but probably reflect stable chronic microvascular ischemic changes. There is no extra-axial fluid collection. Vascular: Major vessel flow voids at the skull base are preserved. Skull and upper cervical spine: Foci of abnormal T1 signal in the upper cervical spine reflecting metastatic disease. Sinuses/Orbits: Minor paranasal sinus mucosal thickening. Left lens replacement. Other: Mastoid air cells are clear.  Sella is unremarkable.  IMPRESSION: Motion degraded study demonstrates small foci of acute infarction involving the lateral right thalamus/posterior limb of the internal capsule and right cerebellar hemisphere. No evidence of intracranial metastatic disease. Cervical spine metastatic disease as seen previously. These results will be called to the ordering clinician or representative by the Radiologist Assistant, and communication documented in the PACS or zVision Dashboard. Electronically Signed   By: Macy Mis M.D.   On: 10/01/2019 08:31    Assessment: 73 y.o. male cancer patient presenting with small acute ischemic infarctions in two separate vascular territories.   1. Most likely etiology for the strokes is hypercoagulable state due to cancer. Also may be cardioembolic.  2. Stroke Risk Factors - HLD, HTN, smoking and cancer  Plan: 1. HgbA1c, fasting lipid panel 2. MRA  of the brain without contrast 3. PT consult, OT consult, Speech consult 4. Echocardiogram 5. Carotid dopplers 6. Prophylactic therapy- ASA 81 mg po qd if no contraindication from a medical standpoint. 7. Continue atorvastatin.  8. Telemetry monitoring 9. Frequent neuro checks 10. Risk factor modification 11. BP management. Out of permissive HTN time window   @Electronically  signed: Dr. Kerney Elbe  10/02/2019, 6:32 AM

## 2019-10-02 NOTE — Progress Notes (Signed)
PT Cancellation Note  Patient Details Name: Jeremy Mack MRN: [id number] DOB: 1946-06-21   Cancelled Treatment:    Reason Eval/Treat Not Completed: PT screened, no needs identified per OT evaluation, will sign off. Please reconsult if new needs arise.  Mabeline Caras, PT, DPT Acute Rehabilitation Services  Pager [phone] Office [place] 10/02/2019, 4:27 PM

## 2019-10-02 NOTE — Progress Notes (Signed)
  Echocardiogram 2D Echocardiogram has been performed.  Jennette Dubin 10/02/2019, 10:51 AM

## 2019-10-02 NOTE — Progress Notes (Addendum)
HEMATOLOGY-ONCOLOGY PROGRESS NOTE  SUBJECTIVE: The patient was sent to the ER due to abnormal MRI findings. MRI showed small foci of acute infarction involving the lateral right thalamus/posterior limb of the internal capsule and right cerebellar hemisphere. Reports some mild leg weakness on admission. This has now resolved. Today, he thinks his leg issue was more cramping and attributes this to recent gardening. He reports an intermittent cough. He has no headaches, dizziness, or weakness. States he feels well overall. Neurology is following.   Oncology History Overview Note  Cancer Staging Small cell lung cancer ([place]) Staging form: Lung, AJCC 8th Edition - Clinical: Stage IVB (cTX, cN2, pM1c) - Signed by Truitt Merle, MD on 06/06/2019    Small cell lung cancer ([place])  07/11/2018 Imaging   CT AP 07/11/18 IMPRESSION: 1. Significantly improved appearance of the pancreas and peripancreatic tissues. Improved to resolved peripancreatic edema with resolved and significantly improved peripancreatic lesions. The remaining lesion was felt to represent abscess on prior endoscopic ultrasound sampling. Pseudocyst or infected pseudocyst would be the favored imaging diagnosis. 2. Persistent but improved mass effect upon the inferior aspect of the portal vein and superior aspect of the superior mesenteric vein. 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 3. Aortic Atherosclerosis (ICD10-I70.0). Bilateral common iliac artery ectasia. 4. A left adrenal nodule is indeterminate on precontrast imaging and may have decreased in size since the prior. Consider dedicated adrenal protocol CT. This could either be performed in 6 months to confirm size stability or more acutely, depending on clinical concern. 5. A splenic lesion is indeterminate and can be re-evaluated on follow-up. 6. Hepatic morphology suspicious for cirrhosis. 7. Cholelithiasis. 8.  Emphysema (ICD10-J43.9).   05/01/2019  Imaging   CT AP 05/01/19  IMPRESSION: 1. New diffuse liver metastases. 2. New mild retroperitoneal lymphadenopathy, consistent with metastatic disease. 3. Resolution of previously seen pancreatic mass since previous study. No radiographic evidence of acute pancreatitis. 4. Stable left adrenal mass, consistent with adrenal adenoma. 5. Stable markedly enlarged prostate and findings of chronic bladder outlet obstruction. 6. Colonic diverticulosis. No radiographic evidence of diverticulitis. 7. Cholelithiasis.   Aortic Atherosclerosis (ICD10-I70.0).   05/28/2019 PET scan   PET 05/28/19  IMPRESSION: 1. Examination is positive for widespread FDG avid liver metastases. Liver lesions are too numerous to count. 2. Hypermetabolic mediastinal and upper abdominal nodal metastases. 3. Left upper lobe and right upper lobe hypermetabolic pulmonary nodules consistent with pulmonary metastasis. 4. Innumerable liver metastases noted within the axial and proximal appendicular skeleton. Bone lesions are too numerous to count. Some of these have a corresponding lytic lesion on CT images. Others appear occult on the corresponding CT images. 5. Solid hyperdense left adrenal nodule is not significantly changed from 05/01/2019 and exhibits mild FDG uptake. Indeterminate. 6. Prostate gland enlargement. Focal area of increased uptake within the left posterior gland is indeterminate.   06/04/2019 Initial Biopsy   Biopsy 06/04/19  Diagnosis Liver, needle/core biopsy - SMALL CELL CARCINOMA. SEE COMMENT.   06/06/2019 Initial Diagnosis   Small cell lung cancer ([place])   06/06/2019 Cancer Staging   Staging form: Lung, AJCC 8th Edition - Clinical: Stage IVB (cTX, cN2, pM1c) - Signed by Truitt Merle, MD on 06/06/2019   06/10/2019 -  Chemotherapy   Carboplatin Day 1 and [place] day 1-3 with immunotherapy atezolizumab (Tecentriq) on day 1, every 3 weeks for 6 cycles starting 06/10/19. Plan to continue maintenance Tecentriq  afterward.    06/14/2019 Imaging    MRI brain 06/14/19  IMPRESSION: No brain metastases.  Chronic small-vessel ischemic changes of the pons and cerebral hemispheric white matter.   Probable metastatic lesions within the upper cervical spine as seen. No sign of spinal canal encroachment.   07/30/2019 Imaging   CT CAP W Contrast  IMPRESSION: 1. Marked interval decrease in size of the hypermetabolic bilateral pulmonary nodules seen on previous PET-CT. 2. Clear interval decrease in the numerous hepatic metastases. 3. Interval resolution of mediastinal and upper abdominal lymphadenopathy seen previously. 4. Stable left adrenal nodule. This shows low level FDG uptake on prior PET-CT and is indeterminate. Continued attention on follow-up recommended. 5. No new or progressive findings to suggest disease progression. 6. Stable hypoenhancing splenic lesion. Attention on follow-up recommended. 7. Prostatomegaly. 8.  Aortic Atherosclerois (ICD10-170.0)        REVIEW OF SYSTEMS:   Constitutional: Denies fevers, chills Respiratory: Reports intermittent cough. Denies dyspnea or wheezes Cardiovascular: Denies palpitation, chest discomfort Gastrointestinal:  Denies nausea, heartburn or change in bowel habits Skin: Denies abnormal skin rashes Lymphatics: Denies new lymphadenopathy or easy bruising Neurological:Denies numbness, tingling or new weaknesses Behavioral/Psych: Mood is stable, no new changes  Extremities: No lower extremity edema All other systems were reviewed with the patient and are negative.  I have reviewed the past medical history, past surgical history, social history and family history with the patient and they are unchanged from previous note.   PHYSICAL EXAMINATION: ECOG PERFORMANCE STATUS: 1 - Symptomatic but completely ambulatory  Vitals:   10/02/19 0600 10/02/19 0700  BP: (!) 163/95 (!) 161/70  Pulse: 67   Resp: 13   Temp: 98.7 F (37.1 C) 98.6 F (37 C)   SpO2:     Filed Weights   10/02/19 0600  Weight: 165 lb 5.5 oz (75 kg)    Intake/Output from previous day: No intake/output data recorded.  GENERAL:alert, no distress and comfortable SKIN: skin color, texture, turgor are normal, no rashes or significant lesions EYES: normal, Conjunctiva are pink and non-injected, sclera clear OROPHARYNX:no exudate, no erythema and lips, buccal mucosa, and tongue normal  NECK: supple, thyroid normal size, non-tender, without nodularity LYMPH:  no palpable lymphadenopathy in the cervical, axillary or inguinal LUNGS: clear to auscultation and percussion with normal breathing effort HEART: regular rate & rhythm and no murmurs and no lower extremity edema ABDOMEN:abdomen soft, non-tender and normal bowel sounds Musculoskeletal:no cyanosis of digits and no clubbing  NEURO: alert & oriented x 3 with fluent speech, no focal motor/sensory deficits  LABORATORY DATA:  I have reviewed the data as listed CMP Latest Ref Rng & Units 10/01/2019 09/24/2019 09/02/2019  Glucose 70 - 99 mg/dL 102(H) 114(H) 118(H)  BUN 8 - 23 mg/dL 12 13 7(L)  Creatinine 0.61 - 1.24 mg/dL 1.08 1.17 0.96  Sodium 135 - 145 mmol/L 139 142 146(H)  Potassium 3.5 - 5.1 mmol/L 3.8 4.2 3.9  Chloride 98 - 111 mmol/L 106 109 113(H)  CO2 22 - 32 mmol/L 22 24 22   Calcium 8.9 - 10.3 mg/dL 9.2 9.2 8.7(L)  Total Protein 6.5 - 8.1 g/dL 8.1 7.3 6.9  Total Bilirubin 0.3 - 1.2 mg/dL 0.7 0.4 0.3  Alkaline Phos 38 - 126 U/L 127(H) 133(H) 146(H)  AST 15 - 41 U/L 18 15 12(L)  ALT 0 - 44 U/L 13 9 7     Lab Results  Component Value Date   WBC 6.4 10/01/2019   HGB 13.1 10/01/2019   HCT 41.0 10/01/2019   MCV 106.2 (H) 10/01/2019   PLT 174 10/01/2019   NEUTROABS 3.7 10/01/2019  Mr Angio Head Wo Contrast  Result Date: 10/02/2019 CLINICAL DATA:  73 year old male with small right thalamic and cerebellar infarcts on recent brain MRI 4 small cell lung cancer restaging. EXAM: MRA HEAD WITHOUT CONTRAST  TECHNIQUE: Angiographic images of the Circle of Willis were obtained using MRA technique without intravenous contrast. COMPARISON:  Brain MRI 09/30/2019 and earlier. FINDINGS: Antegrade flow in the posterior circulation. No distal vertebral stenosis. Bilateral PICA flow is visible, the right PICA origin is included and appears normal. Patent basilar artery without stenosis. Mild to moderate basilar tortuosity. SCA and PCA origins are normal. Posterior communicating arteries are diminutive or absent. Left PCA branches are within normal limits. There is minimal irregularity of the right PCA P2 segment without significant stenosis (series 253, image 11). Antegrade flow in both ICA siphons. The left siphon appears dominant owing to the ACA anatomy. There is mild to moderate right siphon irregularity with susceptibility suggesting calcified plaque, and mild to moderate right siphon stenosis in the cavernous segment on series 254, image 12. no left siphon stenosis. Patent carotid termini. Normal ophthalmic artery origins. Normal MCA origins. Dominant left and absent right ACA A1 segments. Mildly ectatic anterior communicating artery. Median artery of the corpus callosum (normal variant). Visible ACA branches are within normal limits. Tortuous left MCA M1 segment. Left MCA trifurcation is patent without stenosis. Mildly tortuous right M1. Patent right MCA bifurcation without stenosis. Visible bilateral MCA branches are within normal limits. IMPRESSION: 1. Negative for large vessel occlusion. No posterior circulation stenosis, although there is mild irregularity of the right PCA in the region of the right thalamostriate artery. 2. Non dominant right ICA siphon with atherosclerosis and up to moderate right siphon stenosis in the cavernous segment. 3. Otherwise negative anterior circulation. Electronically Signed   By: Genevie Ann M.D.   On: 10/02/2019 10:19   Mr Jeri Cos CX Contrast  Result Date: 10/01/2019 CLINICAL DATA:   Small-cell lung cancer, staging EXAM: MRI HEAD WITHOUT AND WITH CONTRAST TECHNIQUE: Multiplanar, multiecho pulse sequences of the brain and surrounding structures were obtained without and with intravenous contrast. CONTRAST:  63mL GADAVIST GADOBUTROL 1 MMOL/ML IV SOLN COMPARISON:  June 14, 2019 FINDINGS: Motion artifact is present. Brain: There is a 1.2 cm focus of restricted diffusion in the region of the lateral right thalamus/posterior limb of the internal capsule. Additional smaller focus of restricted diffusion is present in the posteroinferior right cerebellar hemisphere. There is no intracranial mass or abnormal enhancement within the above limitation. No evidence of intracranial hemorrhage. Stable prominence of the ventricles and sulci reflecting mild parenchymal volume loss. There is no hydrocephalus. Patchy and confluent areas of T2 hyperintensity in the supratentorial white matter are nonspecific but probably reflect stable chronic microvascular ischemic changes. There is no extra-axial fluid collection. Vascular: Major vessel flow voids at the skull base are preserved. Skull and upper cervical spine: Foci of abnormal T1 signal in the upper cervical spine reflecting metastatic disease. Sinuses/Orbits: Minor paranasal sinus mucosal thickening. Left lens replacement. Other: Mastoid air cells are clear.  Sella is unremarkable. IMPRESSION: Motion degraded study demonstrates small foci of acute infarction involving the lateral right thalamus/posterior limb of the internal capsule and right cerebellar hemisphere. No evidence of intracranial metastatic disease. Cervical spine metastatic disease as seen previously. These results will be called to the ordering clinician or representative by the Radiologist Assistant, and communication documented in the PACS or zVision Dashboard. Electronically Signed   By: Macy Mis M.D.   On: 10/01/2019 08:31  ASSESSMENT AND PLAN: 1. Metastatic small cell lung  cancer 2. Acute ischemic stroke right thalamic/post limb internal capsule and Rt cerebellar stroke 3. HTN 4. Hyperlipidemia 5. Tobacco dependence  -Appreciate assistance from Hospitalist and Neurology with this patient.  Discussed with patient that we will resume immunotherapy for his lung cancer as an outpatient. Will also consider PCI at later date.    -I spoke with Dr. Erlinda Hong on rounds today. From our standpoint, anticoagulation with DOAC or Lovenox would be acceptable from our standpoint. Will await their final recommendations. -Smoking cessation was encouraged.    LOS: 1 day   Mikey Bussing, DNP, AGPCNP-BC, AOCNP 10/02/19   Addendum  I have seen the patient, examined him. I agree with the assessment and and plan and have edited the notes.   His mutifocal stroke is possible related to his metastatic cancer related hypercoagulopathy. I appreciate the neuro team's input. I agree with Dr. Phoebe Sharps recommendation of aspirin and Eliquis, he is at high risk for DVT also.   I will f/u after discharge. He will start prophylactic cranial radiation soon, or at a later time due to his acute stroke.   Truitt Merle  10/02/2019

## 2019-10-02 NOTE — Progress Notes (Signed)
OT Cancellation Note  Patient Details Name: Jeremy Mack MRN: [id number] DOB: 1946/07/15   Cancelled Treatment:    Reason Eval/Treat Not Completed: Patient at procedure or test/ unavailable(Pt at MRI. Will return as schedule allows. )  Lequita Halt, OT Student  10/02/2019, 9:06 AM

## 2019-10-02 NOTE — Progress Notes (Signed)
Transitions of Care Pharmacist Note  Jeremy Mack is a 73 y.o. male that has been diagnosed with a hypercoagulable state secondary to stage IV small cell lung cancer and will be prescribed Eliquis (apixaban) at discharge.   Patient Education: I provided the following education on 11/04 to the patient: How to take the medication Described what the medication is Signs of bleeding Signs/symptoms of VTE and stroke  Answered their questions  Discharge Medications Plan: The patient wants to have their discharge medications filled by the Transitions of Care pharmacy rather than their usual pharmacy.  The discharge orders pharmacy has been changed to the Transitions of Care pharmacy, the patient will receive a phone call regarding co-pay, and their medications will be delivered by the Transitions of Care pharmacy.    Thank you,   Sherren Kerns, PharmD PGY1 Acute Care Pharmacy Resident October 02, 2019

## 2019-10-02 NOTE — Discharge Instructions (Signed)
Information on my medicine - ELIQUIS (apixaban)  This medication education was reviewed with me or my healthcare representative as part of my discharge preparation.  The pharmacist that spoke with me during my hospital stay was:  Werner Lean, [facility]  Why was Eliquis prescribed for you? Eliquis was prescribed for you to reduce the risk of forming blood clots that can cause a stroke if you have a medical condition called atrial fibrillation (a type of irregular heartbeat) OR to reduce the risk of a blood clots forming after orthopedic surgery.  What do You need to know about Eliquis ? Take your Eliquis TWICE DAILY - one tablet in the morning and one tablet in the evening with or without food.  It would be best to take the doses about the same time each day.  If you have difficulty swallowing the tablet whole please discuss with your pharmacist how to take the medication safely.  Take Eliquis exactly as prescribed by your doctor and DO NOT stop taking Eliquis without talking to the doctor who prescribed the medication.  Stopping may increase your risk of developing a new clot or stroke.  Refill your prescription before you run out.  After discharge, you should have regular check-up appointments with your healthcare provider that is prescribing your Eliquis.  In the future your dose may need to be changed if your kidney function or weight changes by a significant amount or as you get older.  What do you do if you miss a dose? If you miss a dose, take it as soon as you remember on the same day and resume taking twice daily.  Do not take more than one dose of ELIQUIS at the same time.  Important Safety Information A possible side effect of Eliquis is bleeding. You should call your healthcare provider right away if you experience any of the following: ? Bleeding from an injury or your nose that does not stop. ? Unusual colored urine (red or dark brown) or unusual colored stools (red or  black). ? Unusual bruising for unknown reasons. ? A serious fall or if you hit your head (even if there is no bleeding).  Some medicines may interact with Eliquis and might increase your risk of bleeding or clotting while on Eliquis. To help avoid this, consult your healthcare provider or pharmacist prior to using any new prescription or non-prescription medications, including herbals, vitamins, non-steroidal anti-inflammatory drugs (NSAIDs) and supplements.  This website has more information on Eliquis (apixaban): [email or web address].

## 2019-10-02 NOTE — ED Notes (Signed)
This nurse called [facility] about delay in transport, states they are unable to give an estimated time but will call prior to transport.

## 2019-10-02 NOTE — Progress Notes (Signed)
STROKE TEAM PROGRESS NOTE   INTERVAL HISTORY Pt sitting in chair. Neuro intact. He is surprised that he had stroke and did not feel anything. MRI showed two small infarcts but in two different vascular territory.   Vitals:   10/02/19 0400 10/02/19 0430 10/02/19 0600 10/02/19 0700  BP: 119/76 108/78 (!) 163/95 (!) 161/70  Pulse: (!) 59 62 67   Resp: 17 16 13    Temp:   98.7 F (37.1 C) 98.6 F (37 C)  TempSrc:   Oral   SpO2: 99% 100%    Weight:   75 kg   Height:   5\' 11"  (1.803 m)     CBC:  Recent Labs  Lab 10/01/19 1312  WBC 6.4  NEUTROABS 3.7  HGB 13.1  HCT 41.0  MCV 106.2*  PLT 235    Basic Metabolic Panel:  Recent Labs  Lab 10/01/19 1312  NA 139  K 3.8  CL 106  CO2 22  GLUCOSE 102*  BUN 12  CREATININE 1.08  CALCIUM 9.2   Lipid Panel:     Component Value Date/Time   CHOL 222 (H) 10/02/2019 0443   TRIG 163 (H) 10/02/2019 0443   HDL 37 (L) 10/02/2019 0443   CHOLHDL 6.0 10/02/2019 0443   VLDL 33 10/02/2019 0443   LDLCALC 152 (H) 10/02/2019 0443   HgbA1c:  Lab Results  Component Value Date   HGBA1C 4.9 10/02/2019   Urine Drug Screen:     Component Value Date/Time   LABOPIA NONE DETECTED 10/01/2019 1402   COCAINSCRNUR NONE DETECTED 10/01/2019 1402   LABBENZ NONE DETECTED 10/01/2019 1402   AMPHETMU NONE DETECTED 10/01/2019 1402   THCU POSITIVE (A) 10/01/2019 1402   LABBARB NONE DETECTED 10/01/2019 1402    Alcohol Level     Component Value Date/Time   ETH <10 10/01/2019 1312    IMAGING Mr Angio Head Wo Contrast  Result Date: 10/02/2019 CLINICAL DATA:  73 year old male with small right thalamic and cerebellar infarcts on recent brain MRI 4 small cell lung cancer restaging. EXAM: MRA HEAD WITHOUT CONTRAST TECHNIQUE: Angiographic images of the Circle of Willis were obtained using MRA technique without intravenous contrast. COMPARISON:  Brain MRI 09/30/2019 and earlier. FINDINGS: Antegrade flow in the posterior circulation. No distal vertebral  stenosis. Bilateral PICA flow is visible, the right PICA origin is included and appears normal. Patent basilar artery without stenosis. Mild to moderate basilar tortuosity. SCA and PCA origins are normal. Posterior communicating arteries are diminutive or absent. Left PCA branches are within normal limits. There is minimal irregularity of the right PCA P2 segment without significant stenosis (series 253, image 11). Antegrade flow in both ICA siphons. The left siphon appears dominant owing to the ACA anatomy. There is mild to moderate right siphon irregularity with susceptibility suggesting calcified plaque, and mild to moderate right siphon stenosis in the cavernous segment on series 254, image 12. no left siphon stenosis. Patent carotid termini. Normal ophthalmic artery origins. Normal MCA origins. Dominant left and absent right ACA A1 segments. Mildly ectatic anterior communicating artery. Median artery of the corpus callosum (normal variant). Visible ACA branches are within normal limits. Tortuous left MCA M1 segment. Left MCA trifurcation is patent without stenosis. Mildly tortuous right M1. Patent right MCA bifurcation without stenosis. Visible bilateral MCA branches are within normal limits. IMPRESSION: 1. Negative for large vessel occlusion. No posterior circulation stenosis, although there is mild irregularity of the right PCA in the region of the right thalamostriate artery. 2. Non dominant right  ICA siphon with atherosclerosis and up to moderate right siphon stenosis in the cavernous segment. 3. Otherwise negative anterior circulation. Electronically Signed   By: Genevie Ann M.D.   On: 10/02/2019 10:19   Mr Jeri Cos CX Contrast  Result Date: 10/01/2019 CLINICAL DATA:  Small-cell lung cancer, staging EXAM: MRI HEAD WITHOUT AND WITH CONTRAST TECHNIQUE: Multiplanar, multiecho pulse sequences of the brain and surrounding structures were obtained without and with intravenous contrast. CONTRAST:  49mL GADAVIST  GADOBUTROL 1 MMOL/ML IV SOLN COMPARISON:  June 14, 2019 FINDINGS: Motion artifact is present. Brain: There is a 1.2 cm focus of restricted diffusion in the region of the lateral right thalamus/posterior limb of the internal capsule. Additional smaller focus of restricted diffusion is present in the posteroinferior right cerebellar hemisphere. There is no intracranial mass or abnormal enhancement within the above limitation. No evidence of intracranial hemorrhage. Stable prominence of the ventricles and sulci reflecting mild parenchymal volume loss. There is no hydrocephalus. Patchy and confluent areas of T2 hyperintensity in the supratentorial white matter are nonspecific but probably reflect stable chronic microvascular ischemic changes. There is no extra-axial fluid collection. Vascular: Major vessel flow voids at the skull base are preserved. Skull and upper cervical spine: Foci of abnormal T1 signal in the upper cervical spine reflecting metastatic disease. Sinuses/Orbits: Minor paranasal sinus mucosal thickening. Left lens replacement. Other: Mastoid air cells are clear.  Sella is unremarkable. IMPRESSION: Motion degraded study demonstrates small foci of acute infarction involving the lateral right thalamus/posterior limb of the internal capsule and right cerebellar hemisphere. No evidence of intracranial metastatic disease. Cervical spine metastatic disease as seen previously. These results will be called to the ordering clinician or representative by the Radiologist Assistant, and communication documented in the PACS or zVision Dashboard. Electronically Signed   By: Macy Mis M.D.   On: 10/01/2019 08:31    PHYSICAL EXAM  Temp:  [98.6 F (37 C)-98.7 F (37.1 C)] 98.6 F (37 C) (11/04 0700) Pulse Rate:  [57-94] 84 (11/04 1546) Resp:  [13-20] 13 (11/04 0600) BP: (108-178)/(68-108) 155/91 (11/04 1546) SpO2:  [98 %-100 %] 100 % (11/04 0430) Weight:  [75 kg] 75 kg (11/04 0600)  General - Well  nourished, well developed, in no apparent distress.  Ophthalmologic - fundi not visualized due to noncooperation.  Cardiovascular - Regular rhythm and rate.  Mental Status -  Level of arousal and orientation to time, place, and person were intact. Language including expression, naming, repetition, comprehension was assessed and found intact. Fund of Knowledge was assessed and was intact.  Cranial Nerves II - XII - II - Visual field intact OU. III, IV, VI - Extraocular movements intact. V - Facial sensation intact bilaterally. VII - Facial movement intact bilaterally. VIII - Hearing & vestibular intact bilaterally. X - Palate elevates symmetrically. XI - Chin turning & shoulder shrug intact bilaterally. XII - Tongue protrusion intact.  Motor Strength - The patient's strength was normal in all extremities and pronator drift was absent.  Bulk was normal and fasciculations were absent.   Motor Tone - Muscle tone was assessed at the neck and appendages and was normal.  Reflexes - The patient's reflexes were symmetrical in all extremities and he had no pathological reflexes.  Sensory - Light touch, temperature/pinprick were assessed and were symmetrical.    Coordination - The patient had normal movements in the hands and feet with no ataxia or dysmetria.  Tremor was absent.  Gait and Station - deferred.   ASSESSMENT/PLAN Mr.  Jeremy Mack is a 73 y.o. male with history of stage IV small cell lung cancer, s/p chemotherapy, with cervical spine, liver and bone metastases presenting to [facility] ED after MRI done to look for mets showed a R thalamic and R cerebellar infarct. He reports LLE weakness while walking, no other sx.   Stroke: Small R thalamic and small R cerebellar infarcts, two different vascular territory distribution, likely embolic secondary to hypercoagulability from advanced lung cancer w/ mets  MRI  Small lateral R thalamic infarct and small R cerebellar infarct.  No brain mets. CSpine mets as previous.   MRA  No LVO. R ICA siphon w/ moderate stenosis.   Carotid Doppler unremarkable  2D Echo EF 60-65%. No source of embolus. IA septum is lipomatous  LE doppler no DVT   LDL 152  HgbA1c 4.9  UDS positive THC  SCDs for VTE prophylaxis  aspirin 325 mg daily prior to admission, now on aspirin 325 mg daily. Will recommend anticoagulation with eliquis 5mg  bid.   Therapy recommendations:  pending   Disposition:  pending   Stage IV small cell lung cancer   s/p chemo w/ cervical spine, liver and bone mets  Likely hypercoagulable state  Follows with oncology  Has chemo scheduled next week  Discussed with oncology NP and will start eliquis  Hypertension  Stable . Permissive hypertension (OK if < 180/105) but gradually normalize in 5-7 days . Long-term BP goal normotensive  Hyperlipidemia  Home meds:  Omega 3  Now on lipitor 40  LDL 152, goal < 70  Continue statin at discharge   Tobacco abuse  Current smoker  Smoking cessation counseling provided  Pt is willing to quit  Other Stroke Risk Factors  Advanced age  ETOH use, advised to drink no more than 2 drink(s) a day  Substance abuse UDS:  THC POSITIVE. Patient advised to stop using due to stroke risk.  Other [facility] day # 1  Neurology will sign off. Please call with questions. Pt will follow up with stroke clinic NP at [facility] in about 4 weeks. Thanks for the consult.  Rosalin Hawking, MD PhD Stroke Neurology 10/02/2019 4:14 PM    To contact Stroke Continuity provider, please refer to [email or web address] After hours, contact General Neurology

## 2019-10-03 ENCOUNTER — Ambulatory Visit: Payer: Medicare PPO | Admitting: Radiation Oncology

## 2019-10-03 MED ORDER — HEPARIN SOD (PORK) LOCK FLUSH 100 UNIT/ML IV SOLN
500.0000 [IU] | INTRAVENOUS | Status: AC | PRN
  Administered 2019-10-03: 500 [IU]
  Filled 2019-10-03: qty 5

## 2019-10-03 MED ORDER — APIXABAN 5 MG PO TABS: 5.0000 mg | ORAL_TABLET | Freq: Two times a day (BID) | ORAL | 0 refills | Status: DC

## 2019-10-03 MED ORDER — ATORVASTATIN CALCIUM 40 MG PO TABS: 40.0000 mg | ORAL_TABLET | Freq: Every day | ORAL | 0 refills | Status: DC

## 2019-10-03 MED FILL — ELIQUIS 5 MG TABLET: 5 | 30 days supply | Qty: 60 | Fill #0

## 2019-10-03 MED FILL — ATORVASTATIN CALCIUM 40 MG: 40 | 30 days supply | Qty: 30 | Fill #0

## 2019-10-03 NOTE — TOC Transition Note (Signed)
Transition of Care [facility]) - CM/SW Discharge Note   Patient Details  Name: Jeremy Mack MRN: [id number] Date of Birth: [birth year]  Transition of Care [facility]) CM/SW Contact:  Pollie Friar, RN Phone Number: 10/03/2019, 11:45 AM   Clinical Narrative:    Pt discharging home with outpatient rehab follow up. Pt selected [facility]. Orders in Epic and information on the AVS.  Pt provided 30 day free Eliquis card.  Pts spouse to provide transportation home.    Final next level of care: OP Rehab Barriers to Discharge: No Barriers Identified   Patient Goals and CMS Choice     Choice offered to / list presented to : Patient  Discharge Placement                       Discharge Plan and Services                                     Social Determinants of Health (SDOH) Interventions     Readmission Risk Interventions No flowsheet data found.

## 2019-10-03 NOTE — Discharge Summary (Signed)
Physician Discharge Summary  Jeremy Mack HBZ:[id number] DOB: Dec 19, 1945 DOA: 10/01/2019  PCP: [name], FNP  Admit date: 10/01/2019 Discharge date: 10/03/2019  Admitted From: home Disposition:  home  Recommendations for Outpatient Follow-up:  1. Follow up with PCP in 1-2 weeks 2. Please obtain BMP/CBC in one week 3. Please follow up on the following pending results:  Home Health:no  Equipment/Devices:none  Discharge Condition: Stable CODE STATUS: FULL Diet recommendation: Heart Healthy,  Brief/Interim Summary: 73 year old male with a stage IV SCC of lung status post 5 cycles of Tecentriq, and C-spine meds liver and bone mets, history of hypertension, hyperlipidemia, GERD who underwent MRI to assess for brain mets in preparation for possible need for palliative radiation and found to have acute infarcts and sent to the hospital for further management.  Ports left lower extremity feeling weaker in past 1 to 2 weeks.  Smokes 2-3 drinks every day occasional alcohol use no drug use lives home with wife. Patient is admitted for further management. Patient was found to have acute stroke in 2 different vascular territory thought to be from hypercoagulable state from his metastatic lung cancer, initiated on Eliquis seen by hematology oncology and completed stroke work-up as below. Is stable and is being discharged home.  Discharge Diagnoses:  Active Problems:   Acute ischemic stroke (HCC)  Acute ischemic stroke right thalamic/post limb internal capsule and Rt cerebellar stroke: 2 different  vascular territory distribution suspecting embolic secondary to hypercoagulability from advanced lung cancer with mets-seen by neurology, hematology and started on Eliquis for anticoagulation on aspirin, statin. Completed stroke work-up with ldl 152, hba1c <5. MRI  Small lateral R thalamic infarct and small R cerebellar infarct. No brain mets. CSpine mets as previous.   MRA  No LVO. R ICA siphon  w/ moderate stenosis. Carotid Doppler unremarkable 2D Echo EF 60-65%. No source of embolus. IA septum is lipomatous. LE doppler no DVT TTE  Stage IV SCC of ung with liver/bone/spine mets followed by oncology and radiation oncology continue outpatient follow-up.  No meds seen in the MRI.  Continue allopurinol for TLS.  Follow-up with outpatient oncology  Hypertension: Resume up antihypertensives on discharge  HLD: Lipid profile LDL 152 poorly controlled started high intensity statin  GERD: Stable on PPI.  Tobacco dependence: Cessation advised Seen by PT OT no need identified.  Outpatient OT.  Discharge Instructions  Discharge Instructions    Ambulatory referral to Neurology   Complete by: As directed    Follow up with stroke clinic NP (Jessica Vanschaick or Cecille Rubin, if both not available, consider Zachery Dauer, or Ahern) at [facility] in about 4 weeks. Thanks.   Diet - low sodium heart healthy   Complete by: As directed    Discharge instructions   Complete by: As directed    Please call call MD or return to ER for similar or worsening recurring problem that brought you to hospital or if any fever,nausea/vomiting,abdominal pain, uncontrolled pain, chest pain,  shortness of breath or any other alarming symptoms.  Please follow-up your doctor as instructed in a week time and call the office for appointment.  Please avoid alcohol, smoking, or any other illicit substance and maintain healthy habits including taking your regular medications as prescribed.  You were cared for by a hospitalist during your hospital stay. If you have any questions about your discharge medications or the care you received while you were in the hospital after you are discharged, you can call the unit and ask to speak  with the hospitalist on call if the hospitalist that took care of you is not available.  Once you are discharged, your primary care physician will handle any further medical issues. Please note  that NO REFILLS for any discharge medications will be authorized once you are discharged, as it is imperative that you return to your primary care physician (or establish a relationship with a primary care physician if you do not have one) for your aftercare needs so that they can reassess your need for medications and monitor your lab values  Please follow-up with your neurology, oncology as outpatient   Increase activity slowly   Complete by: As directed      Allergies as of 10/03/2019   No Known Allergies     Medication List    TAKE these medications   allopurinol 300 MG tablet Commonly known as: ZYLOPRIM TAKE 1 TABLET BY MOUTH TWICE A DAY   amLODipine 10 MG tablet Commonly known as: NORVASC Take 1 tablet (10 mg total) by mouth daily.   apixaban 5 MG Tabs tablet Commonly known as: ELIQUIS Take 1 tablet (5 mg total) by mouth 2 (two) times daily.   aspirin 325 MG tablet Take 650 mg by mouth every 4 (four) hours as needed for mild pain or headache.   atorvastatin 40 MG tablet Commonly known as: LIPITOR Take 1 tablet (40 mg total) by mouth daily at 6 PM.   Fish Oil 1000 MG Caps Take 1,000 mg by mouth daily.   lidocaine-prilocaine cream Commonly known as: EMLA Apply 1 application topically as needed. What changed: reasons to take this   nicotine 14 mg/24hr patch Commonly known as: NICODERM CQ - dosed in mg/24 hours Place 1 patch (14 mg total) onto the skin daily.   ondansetron 8 MG tablet Commonly known as: Zofran Take 1 tablet (8 mg total) by mouth 2 (two) times daily as needed for refractory nausea / vomiting. Start on day 3 after carboplatin chemo.   pantoprazole 20 MG tablet Commonly known as: PROTONIX TAKE 1 TABLET BY MOUTH EVERY DAY   polyvinyl alcohol 1.4 % ophthalmic solution Commonly known as: LIQUIFILM TEARS Place 1 drop into both eyes as needed for dry eyes.   prochlorperazine 10 MG tablet Commonly known as: COMPAZINE Take 1 tablet (10 mg total) by  mouth every 6 (six) hours as needed (Nausea or vomiting).   traMADol 50 MG tablet Commonly known as: ULTRAM Take 1 tablet (50 mg total) by mouth every 12 (twelve) hours as needed. What changed: reasons to take this      Follow-up Information    [facility]. Schedule an appointment as soon as possible for a visit in 4 week(s).   Specialty: Neurology Contact information: [address] [place] [phone]       [name], [place] Follow up in 1 week(s).   Specialty: Internal Medicine Contact information: [place] [address] [phone]          No Known Allergies  Procedures/Studies: Mr Angio Head Wo Contrast  Result Date: 10/02/2019 CLINICAL DATA:  73 year old male with small right thalamic and cerebellar infarcts on recent brain MRI 4 small cell lung cancer restaging. EXAM: MRA HEAD WITHOUT CONTRAST TECHNIQUE: Angiographic images of the Circle of Willis were obtained using MRA technique without intravenous contrast. COMPARISON:  Brain MRI 09/30/2019 and earlier. FINDINGS: Antegrade flow in the posterior circulation. No distal vertebral stenosis. Bilateral PICA flow is visible, the right  PICA origin is included and appears normal. Patent basilar artery without stenosis. Mild to moderate basilar tortuosity. SCA and PCA origins are normal. Posterior communicating arteries are diminutive or absent. Left PCA branches are within normal limits. There is minimal irregularity of the right PCA P2 segment without significant stenosis (series 253, image 11). Antegrade flow in both ICA siphons. The left siphon appears dominant owing to the ACA anatomy. There is mild to moderate right siphon irregularity with susceptibility suggesting calcified plaque, and mild to moderate right siphon stenosis in the cavernous segment on series 254, image 12. no left siphon stenosis. Patent carotid termini. Normal ophthalmic  artery origins. Normal MCA origins. Dominant left and absent right ACA A1 segments. Mildly ectatic anterior communicating artery. Median artery of the corpus callosum (normal variant). Visible ACA branches are within normal limits. Tortuous left MCA M1 segment. Left MCA trifurcation is patent without stenosis. Mildly tortuous right M1. Patent right MCA bifurcation without stenosis. Visible bilateral MCA branches are within normal limits. IMPRESSION: 1. Negative for large vessel occlusion. No posterior circulation stenosis, although there is mild irregularity of the right PCA in the region of the right thalamostriate artery. 2. Non dominant right ICA siphon with atherosclerosis and up to moderate right siphon stenosis in the cavernous segment. 3. Otherwise negative anterior circulation. Electronically Signed   By: Genevie Ann M.D.   On: 10/02/2019 10:19   Mr Jeri Cos BS Contrast  Result Date: 10/01/2019 CLINICAL DATA:  Small-cell lung cancer, staging EXAM: MRI HEAD WITHOUT AND WITH CONTRAST TECHNIQUE: Multiplanar, multiecho pulse sequences of the brain and surrounding structures were obtained without and with intravenous contrast. CONTRAST:  35mL GADAVIST GADOBUTROL 1 MMOL/ML IV SOLN COMPARISON:  June 14, 2019 FINDINGS: Motion artifact is present. Brain: There is a 1.2 cm focus of restricted diffusion in the region of the lateral right thalamus/posterior limb of the internal capsule. Additional smaller focus of restricted diffusion is present in the posteroinferior right cerebellar hemisphere. There is no intracranial mass or abnormal enhancement within the above limitation. No evidence of intracranial hemorrhage. Stable prominence of the ventricles and sulci reflecting mild parenchymal volume loss. There is no hydrocephalus. Patchy and confluent areas of T2 hyperintensity in the supratentorial white matter are nonspecific but probably reflect stable chronic microvascular ischemic changes. There is no extra-axial fluid  collection. Vascular: Major vessel flow voids at the skull base are preserved. Skull and upper cervical spine: Foci of abnormal T1 signal in the upper cervical spine reflecting metastatic disease. Sinuses/Orbits: Minor paranasal sinus mucosal thickening. Left lens replacement. Other: Mastoid air cells are clear.  Sella is unremarkable. IMPRESSION: Motion degraded study demonstrates small foci of acute infarction involving the lateral right thalamus/posterior limb of the internal capsule and right cerebellar hemisphere. No evidence of intracranial metastatic disease. Cervical spine metastatic disease as seen previously. These results will be called to the ordering clinician or representative by the Radiologist Assistant, and communication documented in the PACS or zVision Dashboard. Electronically Signed   By: Macy Mis M.D.   On: 10/01/2019 08:31   Vas US Carotid (at [place] [facility] Only)  Result Date: 10/02/2019 Carotid Arterial Duplex Study Indications: CVA. Limitations  Today's exam was limited due to lighting in patients room. Performing Technologist: Antonieta Pert RDMS, RVT  Examination Guidelines: A complete evaluation includes B-mode imaging, spectral Doppler, color Doppler, and power Doppler as needed of all accessible portions of each vessel. Bilateral testing is considered an integral part of a complete examination. Limited examinations for reoccurring  indications may be performed as noted.  Right Carotid Findings: +----------+--------+--------+--------+------------------+--------+           PSV cm/sEDV cm/sStenosisPlaque DescriptionComments +----------+--------+--------+--------+------------------+--------+ CCA Prox  92      10                                         +----------+--------+--------+--------+------------------+--------+ CCA Distal76      12                                         +----------+--------+--------+--------+------------------+--------+ ICA Prox  64       19                                         +----------+--------+--------+--------+------------------+--------+ ICA Mid   61      29                                         +----------+--------+--------+--------+------------------+--------+ ICA Distal73      16                                         +----------+--------+--------+--------+------------------+--------+ ECA       90      13                                         +----------+--------+--------+--------+------------------+--------+ +----------+--------+-------+----------------+-------------------+           PSV cm/sEDV cmsDescribe        Arm Pressure (mmHG) +----------+--------+-------+----------------+-------------------+ [id number]            Multiphasic, WNL                    +----------+--------+-------+----------------+-------------------+ +---------+--------+--+--------+--+---------+ VertebralPSV cm/s54EDV cm/s11Antegrade +---------+--------+--+--------+--+---------+  Left Carotid Findings: +----------+--------+--------+--------+------------------+------------------+           PSV cm/sEDV cm/sStenosisPlaque DescriptionComments           +----------+--------+--------+--------+------------------+------------------+ CCA Prox  83      17                                                   +----------+--------+--------+--------+------------------+------------------+ CCA Mid                                             intimal thickening +----------+--------+--------+--------+------------------+------------------+ CCA Distal67      22                                                   +----------+--------+--------+--------+------------------+------------------+ ICA Prox  62      21                                                   +----------+--------+--------+--------+------------------+------------------+ ICA Distal58      22                                                    +----------+--------+--------+--------+------------------+------------------+ ECA       72      11                                                   +----------+--------+--------+--------+------------------+------------------+ +----------+--------+--------+----------------+-------------------+           PSV cm/sEDV cm/sDescribe        Arm Pressure (mmHG) +----------+--------+--------+----------------+-------------------+ [id number]             Multiphasic, WNL                    +----------+--------+--------+----------------+-------------------+ +---------+--------+--+--------+--+---------+ VertebralPSV cm/s30EDV cm/s12Antegrade +---------+--------+--+--------+--+---------+  Summary: Right Carotid: Velocities in the right ICA are consistent with a 1-39% stenosis. Left Carotid: Velocities in the left ICA are consistent with a 1-39% stenosis. Vertebrals:  Bilateral vertebral arteries demonstrate antegrade flow. Subclavians: Normal flow hemodynamics were seen in bilateral subclavian              arteries. *See table(s) above for measurements and observations.  Electronically signed by Curt Jews MD on 10/02/2019 at 6:23:42 PM.    Final    Vas [place] Lower Extremity Venous (dvt)  Result Date: 10/02/2019  Lower Venous Study Indications: Stroke.  Limitations: Limited due to lighting in patients room. Performing Technologist: Antonieta Pert RDMS, RVT  Examination Guidelines: A complete evaluation includes B-mode imaging, spectral Doppler, color Doppler, and power Doppler as needed of all accessible portions of each vessel. Bilateral testing is considered an integral part of a complete examination. Limited examinations for reoccurring indications may be performed as noted.  +---------+---------------+---------+-----------+----------+--------------+ RIGHT    CompressibilityPhasicitySpontaneityPropertiesThrombus Aging  +---------+---------------+---------+-----------+----------+--------------+ CFV      Full           Yes      Yes                                 +---------+---------------+---------+-----------+----------+--------------+ SFJ      Full                                                        +---------+---------------+---------+-----------+----------+--------------+ FV Prox  Full                                                        +---------+---------------+---------+-----------+----------+--------------+  FV Mid   Full                                                        +---------+---------------+---------+-----------+----------+--------------+ FV DistalFull                                                        +---------+---------------+---------+-----------+----------+--------------+ PFV      Full                                                        +---------+---------------+---------+-----------+----------+--------------+ POP      Full           Yes      Yes                                 +---------+---------------+---------+-----------+----------+--------------+ PTV      Full                                                        +---------+---------------+---------+-----------+----------+--------------+ PERO     Full                                                        +---------+---------------+---------+-----------+----------+--------------+ GSV      Full                                                        +---------+---------------+---------+-----------+----------+--------------+   +---------+---------------+---------+-----------+----------+--------------+ LEFT     CompressibilityPhasicitySpontaneityPropertiesThrombus Aging +---------+---------------+---------+-----------+----------+--------------+ CFV      Full           Yes      Yes                                  +---------+---------------+---------+-----------+----------+--------------+ SFJ      Full                                                        +---------+---------------+---------+-----------+----------+--------------+ FV Prox  Full                                                        +---------+---------------+---------+-----------+----------+--------------+  FV Mid   Full                                                        +---------+---------------+---------+-----------+----------+--------------+ FV DistalFull                                                        +---------+---------------+---------+-----------+----------+--------------+ PFV      Full                                                        +---------+---------------+---------+-----------+----------+--------------+ POP      Full           Yes      Yes                                 +---------+---------------+---------+-----------+----------+--------------+ PTV      Full                                                        +---------+---------------+---------+-----------+----------+--------------+ PERO     Full                                                        +---------+---------------+---------+-----------+----------+--------------+ GSV      Full                                                        +---------+---------------+---------+-----------+----------+--------------+     Summary: Right: There is no evidence of deep vein thrombosis in the lower extremity. No cystic structure found in the popliteal fossa. Left: There is no evidence of deep vein thrombosis in the lower extremity. No cystic structure found in the popliteal fossa.  *See table(s) above for measurements and observations. Electronically signed by Curt Jews MD on 10/02/2019 at 6:24:50 PM.    Final     Subjective: Seen in bed no new complaints wants to go home this morning.  Discharge Exam: Vitals:    10/02/19 2355 10/03/19 0423  BP: 137/64 (!) 149/87  Pulse: 66 72  Resp: 16 18  Temp: 97.6 F (36.4 C) 97.8 F (36.6 C)  SpO2: 98% 99%   Vitals:   10/02/19 1546 10/02/19 1920 10/02/19 2355 10/03/19 0423  BP: (!) 155/91 (!) 147/94 137/64 (!) 149/87  Pulse: 84 78 66 72  Resp:  16 16 18   Temp:  98.6 F (37 C) 97.6 F (36.4 C) 97.8 F (36.6  C)  TempSrc:  Oral Oral Oral  SpO2:   98% 99%  Weight:      Height:        General: Pt is alert, awake, not in acute distress Cardiovascular: RRR, S1/S2 +, no rubs, no gallops Respiratory: CTA bilaterally, no wheezing, no rhonchi Abdominal: Soft, NT, ND, bowel sounds + Extremities: no edema, no cyanosis   The results of significant diagnostics from this hospitalization (including imaging, microbiology, ancillary and laboratory) are listed below for reference.     Microbiology: Recent Results (from the past 240 hour(s))  SARS CORONAVIRUS 2 (TAT 6-24 HRS) Nasopharyngeal Nasopharyngeal Swab     Status: None   Collection Time: 10/01/19  1:45 PM   Specimen: Nasopharyngeal Swab  Result Value Ref Range Status   SARS Coronavirus 2 NEGATIVE NEGATIVE Final    Comment: (NOTE) SARS-CoV-2 target nucleic acids are NOT DETECTED. The SARS-CoV-2 RNA is generally detectable in upper and lower respiratory specimens during the acute phase of infection. Negative results do not preclude SARS-CoV-2 infection, do not rule out co-infections with other pathogens, and should not be used as the sole basis for treatment or other patient management decisions. Negative results must be combined with clinical observations, patient history, and epidemiological information. The expected result is Negative. Fact Sheet for Patients: SugarRoll.be Fact Sheet for Healthcare Providers: https://www.woods-mathews.com/ This test is not yet approved or cleared by the [place] FDA and  has been authorized for detection and/or  diagnosis of SARS-CoV-2 by FDA under an Emergency Use Authorization (EUA). This EUA will remain  in effect (meaning this test can be used) for the duration of the COVID-19 declaration under Section 56 4(b)(1) of the Act, 21 U.S.C. section 360bbb-3(b)(1), unless the authorization is terminated or revoked sooner. Performed at [facility], [place] [address], [place], [place] [address]      Labs: BNP (last 3 results) No results for input(s): BNP in the last 8760 hours. Basic Metabolic Panel: Recent Labs  Lab 10/01/19 1312  NA 139  K 3.8  CL 106  CO2 22  GLUCOSE 102*  BUN 12  CREATININE 1.08  CALCIUM 9.2   Liver Function Tests: Recent Labs  Lab 10/01/19 1312  AST 18  ALT 13  ALKPHOS 127*  BILITOT 0.7  PROT 8.1  ALBUMIN 4.2   No results for input(s): LIPASE, AMYLASE in the last 168 hours. No results for input(s): AMMONIA in the last 168 hours. CBC: Recent Labs  Lab 10/01/19 1312  WBC 6.4  NEUTROABS 3.7  HGB 13.1  HCT 41.0  MCV 106.2*  PLT 174   Cardiac Enzymes: No results for input(s): CKTOTAL, CKMB, CKMBINDEX, TROPONINI in the last 168 hours. BNP: Invalid input(s): POCBNP CBG: No results for input(s): GLUCAP in the last 168 hours. D-Dimer No results for input(s): DDIMER in the last 72 hours. Hgb A1c Recent Labs    10/02/19 0443  HGBA1C 4.9   Lipid Profile Recent Labs    10/02/19 0443  CHOL 222*  HDL 37*  LDLCALC 152*  TRIG 163*  CHOLHDL 6.0   Thyroid function studies No results for input(s): TSH, T4TOTAL, T3FREE, THYROIDAB in the last 72 hours.  Invalid input(s): FREET3 Anemia work up No results for input(s): VITAMINB12, FOLATE, FERRITIN, TIBC, IRON, RETICCTPCT in the last 72 hours. Urinalysis    Component Value Date/Time   COLORURINE YELLOW 10/01/2019 1402   APPEARANCEUR CLEAR 10/01/2019 1402   LABSPEC 1.017 10/01/2019 1402   PHURINE 6.0 10/01/2019 1402   GLUCOSEU  NEGATIVE 10/01/2019 1402   HGBUR NEGATIVE 10/01/2019 1402    [place] 10/01/2019 1402   KETONESUR NEGATIVE 10/01/2019 1402   PROTEINUR NEGATIVE 10/01/2019 1402   NITRITE NEGATIVE 10/01/2019 1402   LEUKOCYTESUR NEGATIVE 10/01/2019 1402   Sepsis Labs Invalid input(s): PROCALCITONIN,  WBC,  LACTICIDVEN Microbiology Recent Results (from the past 240 hour(s))  SARS CORONAVIRUS 2 (TAT 6-24 HRS) Nasopharyngeal Nasopharyngeal Swab     Status: None   Collection Time: 10/01/19  1:45 PM   Specimen: Nasopharyngeal Swab  Result Value Ref Range Status   SARS Coronavirus 2 NEGATIVE NEGATIVE Final    Comment: (NOTE) SARS-CoV-2 target nucleic acids are NOT DETECTED. The SARS-CoV-2 RNA is generally detectable in upper and lower respiratory specimens during the acute phase of infection. Negative results do not preclude SARS-CoV-2 infection, do not rule out co-infections with other pathogens, and should not be used as the sole basis for treatment or other patient management decisions. Negative results must be combined with clinical observations, patient history, and epidemiological information. The expected result is Negative. Fact Sheet for Patients: SugarRoll.be Fact Sheet for Healthcare Providers: https://www.woods-mathews.com/ This test is not yet approved or cleared by the [place] FDA and  has been authorized for detection and/or diagnosis of SARS-CoV-2 by FDA under an Emergency Use Authorization (EUA). This EUA will remain  in effect (meaning this test can be used) for the duration of the COVID-19 declaration under Section 56 4(b)(1) of the Act, 21 U.S.C. section 360bbb-3(b)(1), unless the authorization is terminated or revoked sooner. Performed at [facility], [place] [address] [place], [place] [address]      Time coordinating discharge: 35 minutes  SIGNED:   Antonieta Pert, MD  [facility] 10/03/2019, 8:54 AM  If 7PM-7AM, please contact night-coverage [email or web address]

## 2019-10-04 ENCOUNTER — Telehealth: Payer: Self-pay | Admitting: *Deleted

## 2019-10-04 NOTE — Telephone Encounter (Signed)
Transition Care Management Follow-up Telephone Call  How have you been since you were released from the hospital? Temesgen Weightman reports: that the patient is doing well.   Do you understand why you were in the hospital? yes   Do you understand the discharge instrcutions? yes  Items Reviewed:  Medications reviewed: yes  Allergies reviewed: no  Dietary changes reviewed: N/A  Referrals reviewed: yes   Functional Questionnaire:   Activities of Daily Living (ADLs):   He states they are independent in the following: ambulation, bathing and hygiene, feeding, continence, grooming, toileting and dressing States they require assistance with the following: none   Any transportation issues/concerns?: no   Any patient concerns? Per wife, no   Confirmed importance and date/time of follow-up visits scheduled: yes, 10/11/19 @ 1300   Confirmed with patient if condition begins to worsen call PCP or go to the ER.  Patient was given the Call-a-Nurse line [phone]: no

## 2019-10-07 ENCOUNTER — Other Ambulatory Visit: Payer: Self-pay

## 2019-10-07 NOTE — Patient Outreach (Signed)
[facility]) Care Management  [place]  10/07/2019   Jeremy Mack 06-23-1946 [id number]  Subjective: Telephone call to patient for EMMI follow up.  Addressed red flag.  Patient states that he does not have appointment for rehab.  Educated patient that according to his discharge summary they will contact him. He verbalized understanding.  Patient lives in the home with spouse and states he is independent with care and still drives.  Discussed transportation and if the need for alternate transportation he has [employer]are through his [employer] benefit.  He verbalized understanding.  Also reviewed with patient his follow up appointments. Discussed with patient signs of stroke and need to seek immediate attention.  He verbalized understanding.   Patient takes his medications and is able to manage his medications per patient.  Patient now on eliquis due to his strokes.  Patient states right now he is able to afford the eliquis.  Patient also has history of lung cancer with mets and is currently in treatment and next appointment is 10-14-2019.  Discussed cancer treatment and patient aware of possible radiation but knows that oncologist will discuss with him.    Objective:   Encounter Medications:  Outpatient Encounter Medications as of 10/07/2019  Medication Sig  . allopurinol (ZYLOPRIM) 300 MG tablet TAKE 1 TABLET BY MOUTH TWICE A DAY (Patient taking differently: Take 300 mg by mouth 2 (two) times daily. )  . amLODipine (NORVASC) 10 MG tablet Take 1 tablet (10 mg total) by mouth daily.  Marland Kitchen apixaban (ELIQUIS) 5 MG TABS tablet Take 1 tablet (5 mg total) by mouth 2 (two) times daily.  Marland Kitchen aspirin 325 MG tablet Take 650 mg by mouth every 4 (four) hours as needed for mild pain or headache.  Marland Kitchen atorvastatin (LIPITOR) 40 MG tablet Take 1 tablet (40 mg total) by mouth daily at 6 PM.  . lidocaine-prilocaine (EMLA) cream Apply 1 application topically as needed. (Patient taking differently:  Apply 1 application topically as needed (port access). )  . Omega-3 Fatty Acids (FISH OIL) 1000 MG CAPS Take 1,000 mg by mouth daily.   . ondansetron (ZOFRAN) 8 MG tablet Take 1 tablet (8 mg total) by mouth 2 (two) times daily as needed for refractory nausea / vomiting. Start on day 3 after carboplatin chemo.  . pantoprazole (PROTONIX) 20 MG tablet TAKE 1 TABLET BY MOUTH EVERY DAY (Patient taking differently: Take 20 mg by mouth daily. )  . polyvinyl alcohol (LIQUIFILM TEARS) 1.4 % ophthalmic solution Place 1 drop into both eyes as needed for dry eyes.  Marland Kitchen prochlorperazine (COMPAZINE) 10 MG tablet Take 1 tablet (10 mg total) by mouth every 6 (six) hours as needed (Nausea or vomiting).  . traMADol (ULTRAM) 50 MG tablet Take 1 tablet (50 mg total) by mouth every 12 (twelve) hours as needed. (Patient taking differently: Take 50 mg by mouth every 12 (twelve) hours as needed for moderate pain. )  . nicotine (NICODERM CQ - DOSED IN MG/24 HOURS) 14 mg/24hr patch Place 1 patch (14 mg total) onto the skin daily. (Patient not taking: Reported on 09/24/2019)   No facility-administered encounter medications on file as of 10/07/2019.     Functional Status:  In your present state of health, do you have any difficulty performing the following activities: 10/07/2019 10/01/2019  Hearing? N N  Vision? N N  Difficulty concentrating or making decisions? N N  Walking or climbing stairs? N N  Dressing or bathing? N N  Doing errands, shopping? N  N  Preparing Food and eating ? N -  Using the Toilet? N -  In the past six months, have you accidently leaked urine? N -  Do you have problems with loss of bowel control? N -  Managing your Medications? N -  Managing your Finances? N -  Housekeeping or managing your Housekeeping? N -  Some recent data might be hidden    Fall/Depression Screening: Fall Risk  10/07/2019 09/09/2016  Falls in the past year? 0 Yes  Number falls in past yr: - 1  Injury with Fall? - Yes    PHQ 2/9 Scores 09/09/2016  PHQ - 2 Score 0    Assessment: Patient with recent stroke due to current cancer treatment. Patient recovering and offers no questions concerning hospitalization.  Patient agreeable to CM telephonic follow up at this time.     Plan:  [facility] CM Care Plan Problem One     Most Recent Value  Care Plan Problem One  Recent hospitalization related to stroke  Role Documenting the Problem One  Care Management Telephonic Coordinator  Care Plan for Problem One  Active  [facility] Long Term Goal   Patient will not experience unplanned hospital readmission within the next 31 days  [facility]N Long Term Goal Start Date  10/07/19  Interventions for Problem One Long Term Goal  Discussed with patient current clinical condition since hospital discharge,  confirmed that patient has all  medications and denies concerns around medications,  reviewed post-hospital discharge instructions with patient and confirmed that patient has reliable transportation to all scheduled provider appointments post-hospital discharge.  Discussed signs of stroke and importance for immediate ER visit.  [facility] CM Short Term Goal #1   Patient will call neurologist to set up follow up within 21 business days.  [facility] CM Short Term Goal #1 Start Date  10/07/19  Interventions for Short Term Goal #1  RN CM discssed importance of follow up appointment.  Patient to call to set up appointment.  [facility]N CM Short Term Goal #2   Outpatient rehab will call patient with appointment within 14 days.  [facility] CM Short Term Goal #2 Start Date  10/07/19  Interventions for Short Term Goal #2  Advised patient that rehab will call him to set up initial appointment.     RN CM will provide ongoing education and support to patient through phone calls.   RN CM will send welcome packet with consent to patient.   RN CM will send initial barriers letter, assessment, and care plan to primary care physician.   RN CM will contact patient next week and patient  agrees to next contact.    [name], RN, MSN [place] Management Care Management Coordinator Direct Line [phone] Cell [phone] Toll Free: [phone]  Fax: [phone]

## 2019-10-09 NOTE — Progress Notes (Signed)
[place]   [phone] [phone]   Clinic Follow up Note   Patient Care Team: [name], FNP as PCP - General (Internal Medicine) Arna Snipe, RN as Oncology Nurse Navigator Jon Billings, RN as [place] Management  Date of Service:  10/14/2019  CHIEF COMPLAINT:  Metastatic Small Cell Lung Cancer  SUMMARY OF ONCOLOGIC HISTORY: Oncology History Overview Note  Cancer Staging Small cell lung cancer ([place]) Staging form: Lung, AJCC 8th Edition - Clinical: Stage IVB (cTX, cN2, pM1c) - Signed by Truitt Merle, MD on 06/06/2019    Small cell lung cancer ([place])  07/11/2018 Imaging   CT AP 07/11/18 IMPRESSION: 1. Significantly improved appearance of the pancreas and peripancreatic tissues. Improved to resolved peripancreatic edema with resolved and significantly improved peripancreatic lesions. The remaining lesion was felt to represent abscess on prior endoscopic ultrasound sampling. Pseudocyst or infected pseudocyst would be the favored imaging diagnosis. 2. Persistent but improved mass effect upon the inferior aspect of the portal vein and superior aspect of the superior mesenteric vein. 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 3. Aortic Atherosclerosis (ICD10-I70.0). Bilateral common iliac artery ectasia. 4. A left adrenal nodule is indeterminate on precontrast imaging and may have decreased in size since the prior. Consider dedicated adrenal protocol CT. This could either be performed in 6 months to confirm size stability or more acutely, depending on clinical concern. 5. A splenic lesion is indeterminate and can be re-evaluated on follow-up. 6. Hepatic morphology suspicious for cirrhosis. 7. Cholelithiasis. 8.  Emphysema (ICD10-J43.9).   05/01/2019 Imaging   CT AP 05/01/19  IMPRESSION: 1. New diffuse liver metastases. 2. New mild retroperitoneal lymphadenopathy, consistent with  metastatic disease. 3. Resolution of previously seen pancreatic mass since previous study. No radiographic evidence of acute pancreatitis. 4. Stable left adrenal mass, consistent with adrenal adenoma. 5. Stable markedly enlarged prostate and findings of chronic bladder outlet obstruction. 6. Colonic diverticulosis. No radiographic evidence of diverticulitis. 7. Cholelithiasis.   Aortic Atherosclerosis (ICD10-I70.0).   05/28/2019 PET scan   PET 05/28/19  IMPRESSION: 1. Examination is positive for widespread FDG avid liver metastases. Liver lesions are too numerous to count. 2. Hypermetabolic mediastinal and upper abdominal nodal metastases. 3. Left upper lobe and right upper lobe hypermetabolic pulmonary nodules consistent with pulmonary metastasis. 4. Innumerable liver metastases noted within the axial and proximal appendicular skeleton. Bone lesions are too numerous to count. Some of these have a corresponding lytic lesion on CT images. Others appear occult on the corresponding CT images. 5. Solid hyperdense left adrenal nodule is not significantly changed from 05/01/2019 and exhibits mild FDG uptake. Indeterminate. 6. Prostate gland enlargement. Focal area of increased uptake within the left posterior gland is indeterminate.   06/04/2019 Initial Biopsy   Biopsy 06/04/19  Diagnosis Liver, needle/core biopsy - SMALL CELL CARCINOMA. SEE COMMENT.   06/06/2019 Initial Diagnosis   Small cell lung cancer ([place])   06/06/2019 Cancer Staging   Staging form: Lung, AJCC 8th Edition - Clinical: Stage IVB (cTX, cN2, pM1c) - Signed by Truitt Merle, MD on 06/06/2019   06/10/2019 -  Chemotherapy   Carboplatin Day 1 and Etopside day 1-3 with immunotherapy atezolizumab (Tecentriq) on day 1, every 3 weeks for 6 cycles starting 06/10/19. Plan to continue maintenance Tecentriq afterward.    06/14/2019 Imaging    MRI brain 06/14/19  IMPRESSION: No brain metastases. Chronic small-vessel ischemic changes of  the pons and cerebral hemispheric white matter.   Probable metastatic lesions within the  upper cervical spine as seen. No sign of spinal canal encroachment.   07/30/2019 Imaging   CT CAP W Contrast  IMPRESSION: 1. Marked interval decrease in size of the hypermetabolic bilateral pulmonary nodules seen on previous PET-CT. 2. Clear interval decrease in the numerous hepatic metastases. 3. Interval resolution of mediastinal and upper abdominal lymphadenopathy seen previously. 4. Stable left adrenal nodule. This shows low level FDG uptake on prior PET-CT and is indeterminate. Continued attention on follow-up recommended. 5. No new or progressive findings to suggest disease progression. 6. Stable hypoenhancing splenic lesion. Attention on follow-up recommended. 7. Prostatomegaly. 8.  Aortic Atherosclerois (ICD10-170.0)        CURRENT THERAPY:  First lineCarboplatin Day 1 and Etopside day 1-3 with immunotherapyatezolizumab(Tecentriq) on day 1, every 3 weeks for 6 cyclesstarting 06/10/19. He is now on maintenance Tecentriq every 3 weeks.  INTERVAL HISTORY:  Jeremy Mack is here for a follow up.  He presents to clinic by himself.  He was recently hospitalized for acute stroke, which was incidentally found on brain MRI prior to prophylactic cranial radiation.  He was asymptomatic.  Stroke work-up was negative, his stroke was felt to be related to his underlying metastatic cancer.  He was started on Eliquis, tolerating well, no signs of bleeding. He feels well, he developed some pain at left low thigh, for a week, no pain when he sits, no leg edema,or other new complains.  He has good appetite and energy level, function well at home.  Review of system otherwise negative.   MEDICAL HISTORY:  Past Medical History:  Diagnosis Date  . Cancer ([place])   . GERD (gastroesophageal reflux disease)   . Hyperlipemia   . Hypertension     SURGICAL HISTORY: Past Surgical History:  Procedure  Laterality Date  . arm fracture surgery Right   . colon polyps removed    . ESOPHAGOGASTRODUODENOSCOPY (EGD) WITH PROPOFOL N/A 06/14/2018   Procedure: ESOPHAGOGASTRODUODENOSCOPY (EGD) WITH PROPOFOL;  Surgeon: [name], MD;  Location: [facility] ENDOSCOPY;  Service: Endoscopy;  Laterality: N/A;  . EUS N/A 06/14/2018   Procedure: ESOPHAGEAL ENDOSCOPIC ULTRASOUND (EUS) RADIAL;  Surgeon: [name], MD;  Location: [facility] ENDOSCOPY;  Service: Endoscopy;  Laterality: N/A;  . FINE NEEDLE ASPIRATION N/A 06/14/2018   Procedure: FINE NEEDLE ASPIRATION (FNA) LINEAR;  Surgeon: [name], MD;  Location: [facility] ENDOSCOPY;  Service: Endoscopy;  Laterality: N/A;  . IR IMAGING GUIDED PORT INSERTION  08/26/2019  . TONSILLECTOMY      I have reviewed the social history and family history with the patient and they are unchanged from previous note.  ALLERGIES:  has No Known Allergies.  MEDICATIONS:  Current Outpatient Medications  Medication Sig Dispense Refill  . allopurinol (ZYLOPRIM) 300 MG tablet TAKE 1 TABLET BY MOUTH TWICE A DAY (Patient taking differently: Take 300 mg by mouth 2 (two) times daily. ) 60 tablet 0  . amLODipine (NORVASC) 10 MG tablet Take 1 tablet (10 mg total) by mouth daily. 90 tablet 1  . apixaban (ELIQUIS) 5 MG TABS tablet Take 1 tablet (5 mg total) by mouth 2 (two) times daily. 60 tablet 3  . aspirin 325 MG tablet Take 650 mg by mouth every 4 (four) hours as needed for mild pain or headache.    Marland Kitchen atorvastatin (LIPITOR) 40 MG tablet Take 1 tablet (40 mg total) by mouth daily at 6 PM. 30 tablet 0  . lidocaine-prilocaine (EMLA) cream Apply 1 application topically as needed. (Patient taking differently: Apply 1 application  topically as needed (port access). ) 30 g 3  . nicotine (NICODERM CQ - DOSED IN MG/24 HOURS) 14 mg/24hr patch Place 1 patch (14 mg total) onto the skin daily. 28 patch 0  . Omega-3 Fatty Acids (FISH OIL) 1000 MG CAPS Take 1,000 mg by mouth daily.     . ondansetron  (ZOFRAN) 8 MG tablet Take 1 tablet (8 mg total) by mouth 2 (two) times daily as needed for refractory nausea / vomiting. Start on day 3 after carboplatin chemo. 30 tablet 1  . pantoprazole (PROTONIX) 20 MG tablet TAKE 1 TABLET BY MOUTH EVERY DAY (Patient taking differently: Take 20 mg by mouth daily. ) 30 tablet 2  . polyvinyl alcohol (LIQUIFILM TEARS) 1.4 % ophthalmic solution Place 1 drop into both eyes as needed for dry eyes.    Marland Kitchen prochlorperazine (COMPAZINE) 10 MG tablet Take 1 tablet (10 mg total) by mouth every 6 (six) hours as needed (Nausea or vomiting). 30 tablet 1  . traMADol (ULTRAM) 50 MG tablet Take 1 tablet (50 mg total) by mouth every 12 (twelve) hours as needed. (Patient taking differently: Take 50 mg by mouth every 12 (twelve) hours as needed for moderate pain. ) 30 tablet 0   No current facility-administered medications for this visit.     PHYSICAL EXAMINATION: ECOG PERFORMANCE STATUS: 1 - Symptomatic but completely ambulatory  Vitals:   10/14/19 1031  BP: (!) 160/100  Pulse: 100  Resp: 17  Temp: 98.2 F (36.8 C)  SpO2: 99%   Filed Weights   10/14/19 1031  Weight: 169 lb 11.2 oz (77 kg)    GENERAL:alert, no distress and comfortable SKIN: skin color, texture, turgor are normal, no rashes or significant lesions EYES: normal, Conjunctiva are pink and non-injected, sclera clear NECK: supple, thyroid normal size, non-tender, without nodularity LYMPH:  no palpable lymphadenopathy in the cervical, axillary  LUNGS: clear to auscultation and percussion with normal breathing effort HEART: regular rate & rhythm and no murmurs and no lower extremity edema ABDOMEN:abdomen soft, non-tender and normal bowel sounds Musculoskeletal:no cyanosis of digits and no clubbing, no leg edema  NEURO: alert & oriented x 3 with fluent speech, no focal motor/sensory deficits  LABORATORY DATA:  I have reviewed the data as listed CBC Latest Ref Rng & Units 10/14/2019 10/01/2019 09/24/2019   WBC 4.0 - 10.5 K/uL 7.7 6.4 5.9  Hemoglobin 13.0 - 17.0 g/dL 13.9 13.1 12.0(L)  Hematocrit 39.0 - 52.0 % 41.8 41.0 36.8(L)  Platelets 150 - 400 K/uL 223 174 216     CMP Latest Ref Rng & Units 10/14/2019 10/01/2019 09/24/2019  Glucose 70 - 99 mg/dL 136(H) 102(H) 114(H)  BUN 8 - 23 mg/dL 13 12 13   Creatinine 0.61 - 1.24 mg/dL 1.24 1.08 1.17  Sodium 135 - 145 mmol/L 142 139 142  Potassium 3.5 - 5.1 mmol/L 4.0 3.8 4.2  Chloride 98 - 111 mmol/L 106 106 109  CO2 22 - 32 mmol/L 24 22 24   Calcium 8.9 - 10.3 mg/dL 9.7 9.2 9.2  Total Protein 6.5 - 8.1 g/dL 8.3(H) 8.1 7.3  Total Bilirubin 0.3 - 1.2 mg/dL 0.5 0.7 0.4  Alkaline Phos 38 - 126 U/L 155(H) 127(H) 133(H)  AST 15 - 41 U/L 25 18 15   ALT 0 - 44 U/L 19 13 9       RADIOGRAPHIC STUDIES: I have personally reviewed the radiological images as listed and agreed with the findings in the report. No results found.   ASSESSMENT & PLAN:  Jeremy Mink  Mack is a 73 y.o. male with   1.Small Cell Lung Cancer,Right lung primary,Metastatic to liver,left lung, LNs andbone -He was diagnosed in 05/2019 with right Small cell lung cancer, TTF(+), metastatic to liver, upper abdominal nodal mets,bilateral lung, right mediastinal nodes with diffuse bone mets. -No brain metastasis as seen on 06/14/19 brain MRI.  -Wepreviouslydiscussed given his stage IV metastatic disease, his cancer is no longer curable but still treatable. -He has completed first lineCarboplatin, etopside on and immunotherapy atezolizumab(Tecentriq) for 4 cycles, and is currently on maintenance Tecentriq.  -He is clinically doing well, recently found to have multifocal small strokes on the MRI, asymptomatic. -I encouraged him to consider PCI, he will get it scheduled soon. -Lab reviewed, adequate for treatment, will proceed Tecentriq today and continue every 3 weeks -Plan to repeat a whole-body scan next month   2.  Multifocal small stroke -This was found on his brain MRI which was  obtained before PCI -He was asymptomatic -continue eliquis   3. H/o pancreatic abscess  -imaging in 05/2018 suspicious for neoplasm, CA 19-9 was 1 -EUS and FNA per Dr. Ardis Hughs showed inflammation, no malignancy -stable   4. SmokingCessation,H/oalcohol use  -He reports a past history of alcohol use for 10-15 years, he does not drink now. -Has 30 year smoking history. He still smokes 1/2 ppd. I strongly encouraged him to quit smoking as this can further worsen his condition. He is agreeable and interested in help. I previously called in nicotein patch in 05/2019.  -CT in 05/2018 was concerning for hepatic cirrhosis -Liver biopsy from 05/2019 shows metastatic small Cell Lung cancer, Likely related to his heavy smoking history.  5. Enlarged prostate, elevated PSA  -PSA in 03/2019 9.47, up from 6.8 two years ago -enlarged prostate on CT, asymptomatic.  -He was referred to urology by PCP   6. Neck pain -He recently developed neck pain, positional, resolved with pain medication, possibly muscular related  -He does have bone metastasis in the cervical spine, was evaluated by Dr. Lisbeth Renshaw.  Given his excellent response to chemotherapy, resolved neck pain with pain medication, patient wants to hold radiation for now.  7.Goal of care discussion  -The patient understands the goal of care is palliative. -he is full code now   PLAN: -Lab reviewed, adequate for treatment, will proceed Tecentriq today -I encouraged him to call right lung, to reschedule his brain radiation simulation -Follow-up in 3 weeks, will order restaging whole-body scan next visit.   No problem-specific Assessment & Plan notes found for this encounter.   No orders of the defined types were placed in this encounter.  All questions were answered. The patient knows to call the clinic with any problems, questions or concerns. No barriers to learning was detected. I spent 20 minutes counseling the patient face to face.  The total time spent in the appointment was 25 minutes and more than 50% was on counseling and review of test results     Truitt Merle, MD 10/14/2019   I, Joslyn Devon, am acting as scribe for Truitt Merle, MD.   I have reviewed the above documentation for accuracy and completeness, and I agree with the above.

## 2019-10-10 ENCOUNTER — Ambulatory Visit: Payer: Medicare PPO | Admitting: Radiation Oncology

## 2019-10-11 ENCOUNTER — Ambulatory Visit
Admission: RE | Admit: 2019-10-11 | Discharge: 2019-10-11 | Disposition: A | Discharge status: Home / Self Care | Payer: Medicare PPO | Source: Ambulatory Visit | Attending: Radiation Oncology | Admitting: Radiation Oncology

## 2019-10-11 ENCOUNTER — Encounter: Payer: Self-pay | Admitting: Family

## 2019-10-11 ENCOUNTER — Other Ambulatory Visit: Payer: Self-pay

## 2019-10-11 ENCOUNTER — Ambulatory Visit (INDEPENDENT_AMBULATORY_CARE_PROVIDER_SITE_OTHER): Payer: Medicare PPO | Admitting: Family

## 2019-10-11 ENCOUNTER — Inpatient Hospital Stay: Payer: Self-pay | Admitting: Family

## 2019-10-11 VITALS — BP 148/90 | HR 88 | Temp 98.3°F | Ht 71.0 in | Wt 171.2 lb

## 2019-10-11 DIAGNOSIS — I639 Cerebral infarction, unspecified: Secondary | ICD-10-CM

## 2019-10-11 DIAGNOSIS — I10 Essential (primary) hypertension: Secondary | ICD-10-CM | POA: Diagnosis not present

## 2019-10-11 DIAGNOSIS — C3412 Malignant neoplasm of upper lobe, left bronchus or lung: Secondary | ICD-10-CM

## 2019-10-11 MED ORDER — APIXABAN 5 MG PO TABS: 5.0000 mg | ORAL_TABLET | Freq: Two times a day (BID) | ORAL | 3 refills | Status: DC

## 2019-10-11 MED ORDER — AMLODIPINE BESYLATE 10 MG PO TABS: 10.0000 mg | ORAL_TABLET | Freq: Every day | ORAL | 1 refills | Status: DC

## 2019-10-11 NOTE — Progress Notes (Signed)
Jeremy Mack is a 73 y.o. male with the following history as recorded in EpicCare:  Patient Active Problem List   Diagnosis Date Noted  . Acute ischemic stroke ([place]) 10/01/2019  . Small cell lung cancer ([place]) 06/06/2019  . Goals of care, counseling/discussion 06/06/2019  . Pancreatic abscess   . Malnutrition of moderate degree 06/14/2018  . Pancreatic mass   . Abnormal CT of the abdomen   . Abdominal pain 06/12/2018  . Leukocytosis 06/12/2018  . AKI (acute kidney injury) ([place]) 06/12/2018  . Medicare annual wellness visit, subsequent 09/09/2016  . Routine general medical examination at a health care facility 09/09/2016  . Hyperlipidemia 09/07/2015  . Neck stiffness 05/20/2015  . Hip pain, chronic 05/20/2015  . Essential hypertension 05/20/2015    Current Outpatient Medications  Medication Sig Dispense Refill  . allopurinol (ZYLOPRIM) 300 MG tablet TAKE 1 TABLET BY MOUTH TWICE A DAY (Patient taking differently: Take 300 mg by mouth 2 (two) times daily. ) 60 tablet 0  . amLODipine (NORVASC) 10 MG tablet Take 1 tablet (10 mg total) by mouth daily. 90 tablet 1  . apixaban (ELIQUIS) 5 MG TABS tablet Take 1 tablet (5 mg total) by mouth 2 (two) times daily. 60 tablet 3  . aspirin 325 MG tablet Take 650 mg by mouth every 4 (four) hours as needed for mild pain or headache.    Marland Kitchen atorvastatin (LIPITOR) 40 MG tablet Take 1 tablet (40 mg total) by mouth daily at 6 PM. 30 tablet 0  . lidocaine-prilocaine (EMLA) cream Apply 1 application topically as needed. (Patient taking differently: Apply 1 application topically as needed (port access). ) 30 g 3  . Omega-3 Fatty Acids (FISH OIL) 1000 MG CAPS Take 1,000 mg by mouth daily.     . ondansetron (ZOFRAN) 8 MG tablet Take 1 tablet (8 mg total) by mouth 2 (two) times daily as needed for refractory nausea / vomiting. Start on day 3 after carboplatin chemo. 30 tablet 1  . pantoprazole (PROTONIX) 20 MG tablet TAKE 1 TABLET BY MOUTH EVERY DAY (Patient taking  differently: Take 20 mg by mouth daily. ) 30 tablet 2  . polyvinyl alcohol (LIQUIFILM TEARS) 1.4 % ophthalmic solution Place 1 drop into both eyes as needed for dry eyes.    Marland Kitchen prochlorperazine (COMPAZINE) 10 MG tablet Take 1 tablet (10 mg total) by mouth every 6 (six) hours as needed (Nausea or vomiting). 30 tablet 1  . traMADol (ULTRAM) 50 MG tablet Take 1 tablet (50 mg total) by mouth every 12 (twelve) hours as needed. (Patient taking differently: Take 50 mg by mouth every 12 (twelve) hours as needed for moderate pain. ) 30 tablet 0  . nicotine (NICODERM CQ - DOSED IN MG/24 HOURS) 14 mg/24hr patch Place 1 patch (14 mg total) onto the skin daily. (Patient not taking: Reported on 09/24/2019) 28 patch 0   No current facility-administered medications for this visit.     Allergies: Patient has no known allergies.  Past Medical History:  Diagnosis Date  . Cancer ([place])   . GERD (gastroesophageal reflux disease)   . Hyperlipemia   . Hypertension     Past Surgical History:  Procedure Laterality Date  . arm fracture surgery Right   . colon polyps removed    . ESOPHAGOGASTRODUODENOSCOPY (EGD) WITH PROPOFOL N/A 06/14/2018   Procedure: ESOPHAGOGASTRODUODENOSCOPY (EGD) WITH PROPOFOL;  Surgeon: [name], MD;  Location: [facility];  Service: Endoscopy;  Laterality: N/A;  . EUS N/A 06/14/2018  Procedure: ESOPHAGEAL ENDOSCOPIC ULTRASOUND (EUS) RADIAL;  Surgeon: [name], MD;  Location: [facility];  Service: Endoscopy;  Laterality: N/A;  . FINE NEEDLE ASPIRATION N/A 06/14/2018   Procedure: FINE NEEDLE ASPIRATION (FNA) LINEAR;  Surgeon: [name], MD;  Location: [facility];  Service: Endoscopy;  Laterality: N/A;  . IR IMAGING GUIDED PORT INSERTION  08/26/2019  . TONSILLECTOMY      Family History  Problem Relation Age of Onset  . Diabetes Mother   . Diabetes Maternal Grandmother     Social History   Tobacco Use  . Smoking status: Current Every Day Smoker    Packs/day:  0.50    Types: Cigars  . Smokeless tobacco: Never Used  . Tobacco comment: Trying to quit  Substance Use Topics  . Alcohol use: Yes    Alcohol/week: 0.0 standard drinks    Comment: Occasionally    Subjective:  Patient is here for hospital follow-up for Acute ischemic strokeright thalamic/post limb internal capsule and Rt cerebellar stroke; thought to have hypercoagulable state from metastatic lung cancer and was started on Eliquis; was discharged home;  CBC/ BMP will be checked by his oncologist next Monday;  Patient notes he is doing very well except he missed his appointment with radiation this morning;  He understands that he is supposed to stay on the Eliquis indefinitely; he does not think he is taking his blood pressure medication.    Objective:  Vitals:   10/11/19 1312  BP: (!) 148/90  Pulse: 88  Temp: 98.3 F (36.8 C)  TempSrc: Oral  SpO2: 99%  Weight: 171 lb 3.2 oz (77.7 kg)  Height: 5\' 11"  (1.803 m)    General: Well developed, well nourished, in no acute distress  Skin : Warm and dry.  Head: Normocephalic and atraumatic  Eyes: Sclera and conjunctiva clear; pupils round and reactive to light; extraocular movements intact  Ears: External normal; canals clear; tympanic membranes normal  Oropharynx: Pink, supple. No suspicious lesions  Neck: Supple without thyromegaly, adenopathy  Lungs: Respirations unlabored; clear to auscultation bilaterally without wheeze, rales, rhonchi  CVS exam: normal rate and regular rhythm.  Neurologic: Alert and oriented; speech intact; face symmetrical; moves all extremities well; CNII-XII intact without focal deficit   Assessment:  1. Acute ischemic stroke ([place])   2. Essential hypertension     Plan:  Refills updated; stressed need to take his Amlodipine daily and take his Eliquis as prescribed; He is encouraged to re-schedule his follow-up with his radiation oncologist and neurologist- he agrees to schedule these appointment.    No  follow-ups on file.  No orders of the defined types were placed in this encounter.   Requested Prescriptions   Signed Prescriptions Disp Refills  . amLODipine (NORVASC) 10 MG tablet 90 tablet 1    Sig: Take 1 tablet (10 mg total) by mouth daily.  Marland Kitchen apixaban (ELIQUIS) 5 MG TABS tablet 60 tablet 3    Sig: Take 1 tablet (5 mg total) by mouth 2 (two) times daily.

## 2019-10-11 NOTE — Patient Instructions (Addendum)
1) Please call Dr. Lisbeth Renshaw to re-schedule your radiation consult;  [place], [place]     Follow-up Information    [facility]. Schedule an appointment as soon as possible for a visit in 4 week(s).   Specialty: Neurology Contact information: [address] [place] [phone]

## 2019-10-14 ENCOUNTER — Other Ambulatory Visit: Payer: Self-pay

## 2019-10-14 ENCOUNTER — Inpatient Hospital Stay: Payer: Medicare PPO

## 2019-10-14 ENCOUNTER — Inpatient Hospital Stay (HOSPITAL_BASED_OUTPATIENT_CLINIC_OR_DEPARTMENT_OTHER): Payer: Medicare PPO | Admitting: Hematology

## 2019-10-14 ENCOUNTER — Encounter: Payer: Self-pay | Admitting: Hematology

## 2019-10-14 ENCOUNTER — Other Ambulatory Visit: Payer: Self-pay | Admitting: Nurse Practitioner

## 2019-10-14 ENCOUNTER — Inpatient Hospital Stay: Payer: Medicare PPO | Attending: Nurse Practitioner

## 2019-10-14 ENCOUNTER — Ambulatory Visit: Payer: Self-pay

## 2019-10-14 VITALS — BP 160/100 | HR 100 | Temp 98.2°F | Resp 17 | Ht 71.0 in | Wt 169.7 lb

## 2019-10-14 VITALS — BP 136/98

## 2019-10-14 DIAGNOSIS — Z79899 Other long term (current) drug therapy: Secondary | ICD-10-CM | POA: Diagnosis not present

## 2019-10-14 DIAGNOSIS — Z5112 Encounter for antineoplastic immunotherapy: Secondary | ICD-10-CM | POA: Insufficient documentation

## 2019-10-14 DIAGNOSIS — Z7901 Long term (current) use of anticoagulants: Secondary | ICD-10-CM | POA: Insufficient documentation

## 2019-10-14 DIAGNOSIS — K219 Gastro-esophageal reflux disease without esophagitis: Secondary | ICD-10-CM | POA: Diagnosis not present

## 2019-10-14 DIAGNOSIS — Z7982 Long term (current) use of aspirin: Secondary | ICD-10-CM | POA: Insufficient documentation

## 2019-10-14 DIAGNOSIS — N400 Enlarged prostate without lower urinary tract symptoms: Secondary | ICD-10-CM | POA: Insufficient documentation

## 2019-10-14 DIAGNOSIS — I10 Essential (primary) hypertension: Secondary | ICD-10-CM

## 2019-10-14 DIAGNOSIS — F17210 Nicotine dependence, cigarettes, uncomplicated: Secondary | ICD-10-CM | POA: Insufficient documentation

## 2019-10-14 DIAGNOSIS — C787 Secondary malignant neoplasm of liver and intrahepatic bile duct: Secondary | ICD-10-CM | POA: Diagnosis not present

## 2019-10-14 DIAGNOSIS — C3490 Malignant neoplasm of unspecified part of unspecified bronchus or lung: Secondary | ICD-10-CM

## 2019-10-14 DIAGNOSIS — C3491 Malignant neoplasm of unspecified part of right bronchus or lung: Secondary | ICD-10-CM | POA: Insufficient documentation

## 2019-10-14 DIAGNOSIS — Z7189 Other specified counseling: Secondary | ICD-10-CM

## 2019-10-14 DIAGNOSIS — C772 Secondary and unspecified malignant neoplasm of intra-abdominal lymph nodes: Secondary | ICD-10-CM | POA: Diagnosis not present

## 2019-10-14 DIAGNOSIS — M542 Cervicalgia: Secondary | ICD-10-CM | POA: Insufficient documentation

## 2019-10-14 DIAGNOSIS — C7951 Secondary malignant neoplasm of bone: Secondary | ICD-10-CM | POA: Insufficient documentation

## 2019-10-14 DIAGNOSIS — E785 Hyperlipidemia, unspecified: Secondary | ICD-10-CM | POA: Insufficient documentation

## 2019-10-14 LAB — CBC WITH DIFFERENTIAL (CANCER CENTER ONLY)
Abs Immature Granulocytes: 0.01 10*3/uL (ref 0.00–0.07)
Basophils Absolute: 0.1 10*3/uL (ref 0.0–0.1)
Basophils Relative: 1 %
Eosinophils Absolute: 0.1 10*3/uL (ref 0.0–0.5)
Eosinophils Relative: 1 %
HCT: 41.8 % (ref 39.0–52.0)
Hemoglobin: 13.9 g/dL (ref 13.0–17.0)
Immature Granulocytes: 0 %
Lymphocytes Relative: 29 %
Lymphs Abs: 2.2 10*3/uL (ref 0.7–4.0)
MCH: 32.6 pg (ref 26.0–34.0)
MCHC: 33.3 g/dL (ref 30.0–36.0)
MCV: 97.9 fL (ref 80.0–100.0)
Monocytes Absolute: 0.6 10*3/uL (ref 0.1–1.0)
Monocytes Relative: 8 %
Neutro Abs: 4.7 10*3/uL (ref 1.7–7.7)
Neutrophils Relative %: 61 %
Platelet Count: 223 10*3/uL (ref 150–400)
RBC: 4.27 MIL/uL (ref 4.22–5.81)
RDW: 14.7 % (ref 11.5–15.5)
WBC Count: 7.7 10*3/uL (ref 4.0–10.5)
nRBC: 0 % (ref 0.0–0.2)

## 2019-10-14 LAB — CMP (CANCER CENTER ONLY)
ALT: 19 U/L (ref 0–44)
AST: 25 U/L (ref 15–41)
Albumin: 4.2 g/dL (ref 3.5–5.0)
Alkaline Phosphatase: 155 U/L — ABNORMAL HIGH (ref 38–126)
Anion gap: 12 (ref 5–15)
BUN: 13 mg/dL (ref 8–23)
CO2: 24 mmol/L (ref 22–32)
Calcium: 9.7 mg/dL (ref 8.9–10.3)
Chloride: 106 mmol/L (ref 98–111)
Creatinine: 1.24 mg/dL (ref 0.61–1.24)
GFR, Est AFR Am: >60 mL/min (ref >60)
GFR, Estimated: 58 mL/min — ABNORMAL LOW (ref >60)
Glucose, Bld: 136 mg/dL — ABNORMAL HIGH (ref 70–99)
Potassium: 4 mmol/L (ref 3.5–5.1)
Sodium: 142 mmol/L (ref 135–145)
Total Bilirubin: 0.5 mg/dL (ref 0.3–1.2)
Total Protein: 8.3 g/dL — ABNORMAL HIGH (ref 6.5–8.1)

## 2019-10-14 LAB — TSH: TSH: 1.008 u[IU]/mL (ref 0.320–4.118)

## 2019-10-14 MED ORDER — SODIUM CHLORIDE 0.9 % IV SOLN
Freq: Once | INTRAVENOUS | Status: AC
  Administered 2019-10-14: 12:00:00 via INTRAVENOUS
  Filled 2019-10-14: qty 250

## 2019-10-14 MED ORDER — SODIUM CHLORIDE 0.9% FLUSH
10.0000 mL | Freq: Once | INTRAVENOUS | Status: AC
  Administered 2019-10-14: 10 mL via INTRAVENOUS
  Filled 2019-10-14: qty 10

## 2019-10-14 MED ORDER — HEPARIN SOD (PORK) LOCK FLUSH 100 UNIT/ML IV SOLN
500.0000 [IU] | Freq: Once | INTRAVENOUS | Status: AC
  Administered 2019-10-14: 13:00:00 500 [IU] via INTRAVENOUS
  Filled 2019-10-14: qty 5

## 2019-10-14 MED ORDER — SODIUM CHLORIDE 0.9 % IV SOLN
1200.0000 mg | Freq: Once | INTRAVENOUS | Status: AC
  Administered 2019-10-14: 1200 mg via INTRAVENOUS
  Filled 2019-10-14: qty 20

## 2019-10-14 NOTE — Progress Notes (Signed)
CBC and CMET reviewed by MD, ok to treat despite lab results.

## 2019-10-14 NOTE — Patient Instructions (Signed)
[facility] Discharge Instructions for Patients Receiving Chemotherapy  Today you received the following chemotherapy agents :  Tecentriq  To help prevent nausea and vomiting after your treatment, we encourage you to take your nausea medication as prescribed.   If you develop nausea and vomiting that is not controlled by your nausea medication, call the clinic.   BELOW ARE SYMPTOMS THAT SHOULD BE REPORTED IMMEDIATELY:  *FEVER GREATER THAN 100.5 F  *CHILLS WITH OR WITHOUT FEVER  NAUSEA AND VOMITING THAT IS NOT CONTROLLED WITH YOUR NAUSEA MEDICATION  *UNUSUAL SHORTNESS OF BREATH  *UNUSUAL BRUISING OR BLEEDING  TENDERNESS IN MOUTH AND THROAT WITH OR WITHOUT PRESENCE OF ULCERS  *URINARY PROBLEMS  *BOWEL PROBLEMS  UNUSUAL RASH Items with * indicate a potential emergency and should be followed up as soon as possible.  Feel free to call the clinic should you have any questions or concerns. The clinic phone number is [phone].  Please show the [facility] at check-in to the Emergency Department and triage nurse.  Atezolizumab injection(Tecentriq) What is this medicine? ATEZOLIZUMAB (a te zoe LIZ ue mab) is a monoclonal antibody. It is used to treat bladder cancer (urothelial cancer), non-small cell lung cancer, small cell lung cancer, and breast cancer. This medicine may be used for other purposes; ask your health care provider or pharmacist if you have questions. COMMON BRAND NAME(S): Tecentriq What should I tell my health care provider before I take this medicine? They need to know if you have any of these conditions:  diabetes  immune system problems  infection  inflammatory bowel disease  liver disease  lung or breathing disease  lupus  nervous system problems like myasthenia gravis or Guillain-Barre syndrome  organ transplant  an unusual or allergic reaction to atezolizumab, other medicines, foods, dyes, or preservatives  pregnant or  trying to get pregnant  breast-feeding How should I use this medicine? This medicine is for infusion into a vein. It is given by a health care professional in a hospital or clinic setting. A special MedGuide will be given to you before each treatment. Be sure to read this information carefully each time. Talk to your pediatrician regarding the use of this medicine in children. Special care may be needed. Overdosage: If you think you have taken too much of this medicine contact a poison control center or emergency room at once. NOTE: This medicine is only for you. Do not share this medicine with others. What if I miss a dose? It is important not to miss your dose. Call your doctor or health care professional if you are unable to keep an appointment. What may interact with this medicine? Interactions have not been studied. This list may not describe all possible interactions. Give your health care provider a list of all the medicines, herbs, non-prescription drugs, or dietary supplements you use. Also tell them if you smoke, drink alcohol, or use illegal drugs. Some items may interact with your medicine. What should I watch for while using this medicine? Your condition will be monitored carefully while you are receiving this medicine. You may need blood work done while you are taking this medicine. Do not become pregnant while taking this medicine or for at least 5 months after stopping it. Women should inform their doctor if they wish to become pregnant or think they might be pregnant. There is a potential for serious side effects to an unborn child. Talk to your health care professional or pharmacist for more information. Do  not breast-feed an infant while taking this medicine or for at least 5 months after the last dose. What side effects may I notice from receiving this medicine? Side effects that you should report to your doctor or health care professional as soon as possible:  allergic  reactions like skin rash, itching or hives, swelling of the face, lips, or tongue  black, tarry stools  bloody or watery diarrhea  breathing problems  changes in vision  chest pain or chest tightness  chills  facial flushing  fever  headache  signs and symptoms of high blood sugar such as dizziness; dry mouth; dry skin; fruity breath; nausea; stomach pain; increased hunger or thirst; increased urination  signs and symptoms of liver injury like dark yellow or brown urine; general ill feeling or flu-like symptoms; light-colored stools; loss of appetite; nausea; right upper belly pain; unusually weak or tired; yellowing of the eyes or skin  stomach pain  trouble passing urine or change in the amount of urine Side effects that usually do not require medical attention (report to your doctor or health care professional if they continue or are bothersome):  cough  diarrhea  joint pain  muscle pain  muscle weakness  tiredness  weight loss This list may not describe all possible side effects. Call your doctor for medical advice about side effects. You may report side effects to FDA at 1-800-FDA-1088. Where should I keep my medicine? This drug is given in a hospital or clinic and will not be stored at home. NOTE: This sheet is a summary. It may not cover all possible information. If you have questions about this medicine, talk to your doctor, pharmacist, or health care provider.  2020 Elsevier/Gold Standard (2018-02-16 09:33:38)    Implanted [facility] Guide An implanted port is a device that is placed under the skin. It is usually placed in the chest. The device can be used to give IV medicine, to take blood, or for dialysis. You may have an implanted port if:  You need IV medicine that would be irritating to the small veins in your hands or arms.  You need IV medicines, such as antibiotics, for a long period of time.  You need IV nutrition for a long period of time.   You need dialysis. Having a port means that your health care provider will not need to use the veins in your arms for these procedures. You may have fewer limitations when using a port than you would if you used other types of long-term IVs, and you will likely be able to return to normal activities after your incision heals. An implanted port has two main parts:  Reservoir. The reservoir is the part where a needle is inserted to give medicines or draw blood. The reservoir is round. After it is placed, it appears as a small, raised area under your skin.  Catheter. The catheter is a thin, flexible tube that connects the reservoir to a vein. Medicine that is inserted into the reservoir goes into the catheter and then into the vein. How is my port accessed? To access your port:  A numbing cream may be placed on the skin over the port site.  Your health care provider will put on a mask and sterile gloves.  The skin over your port will be cleaned carefully with a germ-killing soap and allowed to dry.  Your health care provider will gently pinch the port and insert a needle into it.  Your health care provider  will check for a blood return to make sure the port is in the vein and is not clogged.  If your port needs to remain accessed to get medicine continuously (constant infusion), your health care provider will place a clear bandage (dressing) over the needle site. The dressing and needle will need to be changed every week, or as told by your health care provider. What is flushing? Flushing helps keep the port from getting clogged. Follow instructions from your health care provider about how and when to flush the port. Ports are usually flushed with saline solution or a medicine called heparin. The need for flushing will depend on how the port is used:  If the port is only used from time to time to give medicines or draw blood, the port may need to be flushed: ? Before and after medicines have  been given. ? Before and after blood has been drawn. ? As part of routine maintenance. Flushing may be recommended every 4-6 weeks.  If a constant infusion is running, the port may not need to be flushed.  Throw away any syringes in a disposal container that is meant for sharp items (sharps container). You can buy a sharps container from a pharmacy, or you can make one by using an empty hard plastic bottle with a cover. How long will my port stay implanted? The port can stay in for as long as your health care provider thinks it is needed. When it is time for the port to come out, a surgery will be done to remove it. The surgery will be similar to the procedure that was done to put the port in. Follow these instructions at home:   Flush your port as told by your health care provider.  If you need an infusion over several days, follow instructions from your health care provider about how to take care of your port site. Make sure you: ? Wash your hands with soap and water before you change your dressing. If soap and water are not available, use alcohol-based hand sanitizer. ? Change your dressing as told by your health care provider. ? Place any used dressings or infusion bags into a plastic bag. Throw that bag in the trash. ? Keep the dressing that covers the needle clean and dry. Do not get it wet. ? Do not use scissors or sharp objects near the tube. ? Keep the tube clamped, unless it is being used.  Check your port site every day for signs of infection. Check for: ? Redness, swelling, or pain. ? Fluid or blood. ? Pus or a bad smell.  Protect the skin around the port site. ? Avoid wearing bra straps that rub or irritate the site. ? Protect the skin around your port from seat belts. Place a soft pad over your chest if needed.  Bathe or shower as told by your health care provider. The site may get wet as long as you are not actively receiving an infusion.  Return to your normal  activities as told by your health care provider. Ask your health care provider what activities are safe for you.  Carry a medical alert card or wear a medical alert bracelet at all times. This will let health care providers know that you have an implanted port in case of an emergency. Get help right away if:  You have redness, swelling, or pain at the port site.  You have fluid or blood coming from your port site.  You have pus  or a bad smell coming from the port site.  You have a fever. Summary  Implanted ports are usually placed in the chest for long-term IV access.  Follow instructions from your health care provider about flushing the port and changing bandages (dressings).  Take care of the area around your port by avoiding clothing that puts pressure on the area, and by watching for signs of infection.  Protect the skin around your port from seat belts. Place a soft pad over your chest if needed.  Get help right away if you have a fever or you have redness, swelling, pain, drainage, or a bad smell at the port site. This information is not intended to replace advice given to you by your health care provider. Make sure you discuss any questions you have with your health care provider. Document Released: 11/14/2005 Document Revised: 03/08/2019 Document Reviewed: 12/17/2016 Elsevier Patient Education  2020 [employer].

## 2019-10-15 ENCOUNTER — Encounter: Payer: Self-pay | Admitting: *Deleted

## 2019-10-15 ENCOUNTER — Telehealth: Payer: Self-pay | Admitting: Hematology

## 2019-10-15 ENCOUNTER — Ambulatory Visit: Payer: Medicare PPO

## 2019-10-15 ENCOUNTER — Other Ambulatory Visit: Payer: Self-pay

## 2019-10-15 NOTE — Patient Outreach (Signed)
[facility]) Care Management  10/15/2019  Collis Thede 04-26-1946 [id number]   EMMI- Stroke RED ON EMMI ALERT Day # 6 Date: 10/11/2019 Red Alert Reason: smoked or been around smoke   EMMI- Stroke RED ON EMMI ALERT Day # 9 Date: 10/13/2019  Red Alert Reason: Feeling worse overall? Yes New problems walking/talking/ speaking/seeing? yes  Outreach attempt: Spoke with patient he report he is doing good.  He states that he thinks he messed up with the automated call as it told him to call 911.  Patient denies any problems.  Explained to patient that system sometimes does not recognize things correctly. Patient reassured and verbalized understanding. Patient does however admit to still smoking and is working on quitting and denies need for assistance.   Patient continues his treatments for cancer and denies any problems.  Patient did hear from rehab but will be calling the neurologist.  Patient given [facility] Neurology information again.  He declined needing help to make appointment.  Discussed signs of stroke and importance of seeking medical attention immediately by calling 911.  He verbalized understanding.     Plan: RN CM will contact patient again in the month of December and patient agreeable.    [name], RN, MSN [facility] Care Management Care Management Coordinator Direct Line [phone] Toll Free: [phone]  Fax: [phone]

## 2019-10-15 NOTE — Progress Notes (Signed)
[facility] Work  Clinical Social Work received referral from Therapist, sports for housing.  CSW contacted patient at home to offer support and assess for needs.  Patient stated he currently lived in a house with his wife.  Patient stated he owns the home and has lived there for 30+ years.  Patient informed CSW that he was interested in moving out of his home and into a retirement community or apartment.  Patient stated he had a friend that lives in a similar community in [place].  Patient stated he planned to call his friend and get more information.  CSW provided education on CSW role and support available.  CSW encouraged patient to call if he has additional needs or concerns.    Johnnye Lana, MSW, LCSW, OSW-C Clinical Social Worker [facility] [phone]

## 2019-10-15 NOTE — Telephone Encounter (Signed)
Per 11/16 los Next appointment scheduled

## 2019-10-29 ENCOUNTER — Other Ambulatory Visit: Payer: Self-pay

## 2019-10-29 NOTE — Patient Outreach (Signed)
[facility]) Care Management  [place]  10/29/2019   Jeremy Mack 01/21/1946 [id number]  Subjective: Telephone call to patient for follow up.  Patient reports he is doing good. He reports that he is following up with physicians as directed. He denies problems with transportation. Patient reports however some problems affording Eliquis.  He states that the medication would be about $80 and he cannot afford it.  He states he has enough until the end of this month. Advised patient that CM would do pharmacy referral to see where they can assist.  He verbalized understanding.  Also encouraged patient to ask physician about samples as well. He is agreeable and denies any further concerns.  Objective:   Encounter Medications:  Outpatient Encounter Medications as of 10/29/2019  Medication Sig  . allopurinol (ZYLOPRIM) 300 MG tablet TAKE 1 TABLET BY MOUTH TWICE A DAY (Patient taking differently: Take 300 mg by mouth 2 (two) times daily. )  . amLODipine (NORVASC) 10 MG tablet Take 1 tablet (10 mg total) by mouth daily.  Marland Kitchen apixaban (ELIQUIS) 5 MG TABS tablet Take 1 tablet (5 mg total) by mouth 2 (two) times daily.  Marland Kitchen aspirin 325 MG tablet Take 650 mg by mouth every 4 (four) hours as needed for mild pain or headache.  Marland Kitchen atorvastatin (LIPITOR) 40 MG tablet Take 1 tablet (40 mg total) by mouth daily at 6 PM.  . lidocaine-prilocaine (EMLA) cream Apply 1 application topically as needed. (Patient taking differently: Apply 1 application topically as needed (port access). )  . nicotine (NICODERM CQ - DOSED IN MG/24 HOURS) 14 mg/24hr patch Place 1 patch (14 mg total) onto the skin daily.  . Omega-3 Fatty Acids (FISH OIL) 1000 MG CAPS Take 1,000 mg by mouth daily.   . ondansetron (ZOFRAN) 8 MG tablet Take 1 tablet (8 mg total) by mouth 2 (two) times daily as needed for refractory nausea / vomiting. Start on day 3 after carboplatin chemo.  . pantoprazole (PROTONIX) 20 MG tablet TAKE 1 TABLET  BY MOUTH EVERY DAY  . polyvinyl alcohol (LIQUIFILM TEARS) 1.4 % ophthalmic solution Place 1 drop into both eyes as needed for dry eyes.  Marland Kitchen prochlorperazine (COMPAZINE) 10 MG tablet Take 1 tablet (10 mg total) by mouth every 6 (six) hours as needed (Nausea or vomiting).  . traMADol (ULTRAM) 50 MG tablet Take 1 tablet (50 mg total) by mouth every 12 (twelve) hours as needed. (Patient taking differently: Take 50 mg by mouth every 12 (twelve) hours as needed for moderate pain. )   No facility-administered encounter medications on file as of 10/29/2019.     Functional Status:  In your present state of health, do you have any difficulty performing the following activities: 10/07/2019 10/01/2019  Hearing? N N  Vision? N N  Difficulty concentrating or making decisions? N N  Walking or climbing stairs? N N  Dressing or bathing? N N  Doing errands, shopping? N N  Preparing Food and eating ? N -  Using the Toilet? N -  In the past six months, have you accidently leaked urine? N -  Do you have problems with loss of bowel control? N -  Managing your Medications? N -  Managing your Finances? N -  Housekeeping or managing your Housekeeping? N -  Some recent data might be hidden    Fall/Depression Screening: Fall Risk  10/07/2019 09/09/2016  Falls in the past year? 0 Yes  Number falls in past yr: - 1  Injury with Fall? - Yes   PHQ 2/9 Scores 10/15/2019 09/09/2016  PHQ - 2 Score 0 0    Assessment: Patient continues stroke recovery and cancer treatments.    Plan:  [facility] CM Care Plan Problem One     Most Recent Value  Care Plan Problem One  Recent hospitalization related to stroke  Role Documenting the Problem One  Care Management Telephonic Coordinator  Care Plan for Problem One  Active  [facility] Long Term Goal   Patient will not experience unplanned hospital readmission within the next 31 days  [facility] Long Term Goal Start Date  10/07/19  Interventions for Problem One Long Term Goal  Patient taking  medications as prescribed. Patient following up with physicians as ordered.  Discussed signs of stroke and stroke recovery.    [facility] CM Short Term Goal #1   Patient will call neurologist to set up follow up within 21 business days.  [facility] CM Short Term Goal #1 Start Date  10/07/19  [facility] CM Short Term Goal #1 Met Date  10/29/19  Interventions for Short Term Goal #1  Appt 11/12/2019     RN CM will contact patient again in the month of December and patient agreeable.  [name], RN, MSN [place] [facility] Care Management Coordinator Direct Line [phone] Cell [phone] Toll Free: [phone]  Fax: [phone]

## 2019-10-30 ENCOUNTER — Telehealth: Payer: Self-pay | Admitting: Pharmacist

## 2019-10-30 NOTE — Progress Notes (Signed)
  Radiation Oncology         [phone] ________________________________  Name: Jeremy Mack MRN: [id number]  Date: 10/11/2019  DOB: 1946-06-18  SIMULATION AND TREATMENT PLANNING NOTE   DIAGNOSIS:     ICD-10-CM   1. Primary malignant neoplasm of bronchus of left upper lobe (HCC)  C34.12      The patient presented for simulation for the patient's upcoming course of whole brain radiation treatment. This will be for a course of prophylactic cranial irradiation. The patient was placed in a supine position and a customized thermoplastic head cast was constructed to aid in patient immobilization during the treatment. This complex treatment device will be used on a daily basis. In this fashion a CT scan was obtained through the head and neck region and isocenter was placed near midline within the brain.  The patient will be planned to receive a course of whole brain radiation treatment to a dose of 25 gray in 10 fractions at 2.5 gray per fraction. To accomplish this, 2 customized blocks have been designed which corresponds to left and right whole brain radiation fields. These 2 complex treatment devices will be used on a daily basis during the course of radiation. A complex isodose plan is requested to insure that the target area is adequately covered in to facilitate optimization of the treatment plan. A forward planning technique will also be evaluated to determine if this approach significantly improves the plan.    ________________________________   Jodelle Gross, MD, PhD

## 2019-10-30 NOTE — Patient Outreach (Signed)
[facility]) Care Management  10/30/2019  Jeremy Mack [birth year] [id number]   Patient was called regarding medication assistance. Unfortunately, he did not answer the phone. HIPAA compliant message was left on his home voicemail. The number listed as a mobile did not have a voicemail.  Plan: Send patient an unsuccessful outreach letter. Call patient back in 5-10 business days.  [name], PharmD, [place] Clinical Pharmacist [phone]

## 2019-10-31 ENCOUNTER — Ambulatory Visit: Payer: Medicare PPO | Admitting: Radiation Oncology

## 2019-10-31 ENCOUNTER — Other Ambulatory Visit: Payer: Self-pay

## 2019-10-31 ENCOUNTER — Ambulatory Visit
Admission: RE | Admit: 2019-10-31 | Discharge: 2019-10-31 | Disposition: A | Discharge status: Home / Self Care | Payer: Medicare PPO | Source: Ambulatory Visit | Attending: Radiation Oncology | Admitting: Radiation Oncology

## 2019-10-31 ENCOUNTER — Telehealth: Payer: Self-pay | Admitting: Family

## 2019-10-31 DIAGNOSIS — Z510 Encounter for antineoplastic radiation therapy: Secondary | ICD-10-CM | POA: Insufficient documentation

## 2019-10-31 DIAGNOSIS — C3412 Malignant neoplasm of upper lobe, left bronchus or lung: Secondary | ICD-10-CM | POA: Diagnosis present

## 2019-10-31 NOTE — Telephone Encounter (Signed)
Patient requesting apixaban (ELIQUIS) 5 MG TABS tablet please send to [place], [place], [place] [address] due to the cost being cheaper

## 2019-10-31 NOTE — Telephone Encounter (Signed)
Called and left a voicemail regarding this medication. Advised that the medication was originally sent to the pharmacy requested on 10/11/2019.

## 2019-11-01 ENCOUNTER — Telehealth: Payer: Self-pay | Admitting: Family

## 2019-11-01 ENCOUNTER — Other Ambulatory Visit: Payer: Self-pay | Admitting: Family

## 2019-11-01 DIAGNOSIS — I10 Essential (primary) hypertension: Secondary | ICD-10-CM

## 2019-11-01 DIAGNOSIS — Z510 Encounter for antineoplastic radiation therapy: Secondary | ICD-10-CM | POA: Diagnosis not present

## 2019-11-01 MED ORDER — AMLODIPINE BESYLATE 10 MG PO TABS: 10.0000 mg | ORAL_TABLET | Freq: Every day | ORAL | 3 refills | Status: DC

## 2019-11-01 MED ORDER — ALLOPURINOL 300 MG PO TABS: 300.0000 mg | ORAL_TABLET | Freq: Two times a day (BID) | ORAL | 3 refills | Status: AC

## 2019-11-01 MED ORDER — PANTOPRAZOLE SODIUM 20 MG PO TBEC: 20.0000 mg | DELAYED_RELEASE_TABLET | Freq: Every day | ORAL | 3 refills | Status: AC

## 2019-11-01 MED ORDER — ATORVASTATIN CALCIUM 40 MG PO TABS: 40.0000 mg | ORAL_TABLET | Freq: Every day | ORAL | 3 refills | Status: DC

## 2019-11-01 NOTE — Telephone Encounter (Signed)
He needs to talk to his oncologist about refills for the Tramadol. I cannot do that for him and will not be able to send to mail order as requested.

## 2019-11-01 NOTE — Progress Notes (Signed)
[place]   [phone] [phone]   Clinic Follow up Note   Patient Care Team: [name], Carrizo Hill as PCP - General (Internal Medicine) Virgina Evener, Dawn, RN (Inactive) as Oncology Nurse Navigator Jon Billings, RN as [place] Management [name], [facility] (Pharmacist)  Date of Service:  11/04/2019  CHIEF COMPLAINT: Metastatic Small Cell Lung Cancer  SUMMARY OF ONCOLOGIC HISTORY: Oncology History Overview Note  Cancer Staging Small cell lung cancer ([place]) Staging form: Lung, AJCC 8th Edition - Clinical: Stage IVB (cTX, cN2, pM1c) - Signed by Truitt Merle, MD on 06/06/2019    Primary malignant neoplasm of bronchus of left upper lobe ([place])  07/11/2018 Imaging   CT AP 07/11/18 IMPRESSION: 1. Significantly improved appearance of the pancreas and peripancreatic tissues. Improved to resolved peripancreatic edema with resolved and significantly improved peripancreatic lesions. The remaining lesion was felt to represent abscess on prior endoscopic ultrasound sampling. Pseudocyst or infected pseudocyst would be the favored imaging diagnosis. 2. Persistent but improved mass effect upon the inferior aspect of the portal vein and superior aspect of the superior mesenteric vein. 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 3. Aortic Atherosclerosis (ICD10-I70.0). Bilateral common iliac artery ectasia. 4. A left adrenal nodule is indeterminate on precontrast imaging and may have decreased in size since the prior. Consider dedicated adrenal protocol CT. This could either be performed in 6 months to confirm size stability or more acutely, depending on clinical concern. 5. A splenic lesion is indeterminate and can be re-evaluated on follow-up. 6. Hepatic morphology suspicious for cirrhosis. 7. Cholelithiasis. 8.  Emphysema (ICD10-J43.9).   05/01/2019 Imaging   CT AP 05/01/19  IMPRESSION: 1. New diffuse  liver metastases. 2. New mild retroperitoneal lymphadenopathy, consistent with metastatic disease. 3. Resolution of previously seen pancreatic mass since previous study. No radiographic evidence of acute pancreatitis. 4. Stable left adrenal mass, consistent with adrenal adenoma. 5. Stable markedly enlarged prostate and findings of chronic bladder outlet obstruction. 6. Colonic diverticulosis. No radiographic evidence of diverticulitis. 7. Cholelithiasis.   Aortic Atherosclerosis (ICD10-I70.0).   05/28/2019 PET scan   PET 05/28/19  IMPRESSION: 1. Examination is positive for widespread FDG avid liver metastases. Liver lesions are too numerous to count. 2. Hypermetabolic mediastinal and upper abdominal nodal metastases. 3. Left upper lobe and right upper lobe hypermetabolic pulmonary nodules consistent with pulmonary metastasis. 4. Innumerable liver metastases noted within the axial and proximal appendicular skeleton. Bone lesions are too numerous to count. Some of these have a corresponding lytic lesion on CT images. Others appear occult on the corresponding CT images. 5. Solid hyperdense left adrenal nodule is not significantly changed from 05/01/2019 and exhibits mild FDG uptake. Indeterminate. 6. Prostate gland enlargement. Focal area of increased uptake within the left posterior gland is indeterminate.   06/04/2019 Initial Biopsy   Biopsy 06/04/19  Diagnosis Liver, needle/core biopsy - SMALL CELL CARCINOMA. SEE COMMENT.   06/06/2019 Initial Diagnosis   Small cell lung cancer ([place])   06/06/2019 Cancer Staging   Staging form: Lung, AJCC 8th Edition - Clinical: Stage IVB (cTX, cN2, pM1c) - Signed by Truitt Merle, MD on 06/06/2019   06/10/2019 -  Chemotherapy   Carboplatin Day 1 and Etopside day 1-3 with immunotherapy atezolizumab (Tecentriq) on day 1, every 3 weeks for 6 cycles starting 06/10/19. Plan to continue maintenance Tecentriq afterward.    06/14/2019 Imaging    MRI brain  06/14/19  IMPRESSION: No brain metastases. Chronic small-vessel ischemic changes of the pons and cerebral  hemispheric white matter.   Probable metastatic lesions within the upper cervical spine as seen. No sign of spinal canal encroachment.   07/30/2019 Imaging   CT CAP W Contrast  IMPRESSION: 1. Marked interval decrease in size of the hypermetabolic bilateral pulmonary nodules seen on previous PET-CT. 2. Clear interval decrease in the numerous hepatic metastases. 3. Interval resolution of mediastinal and upper abdominal lymphadenopathy seen previously. 4. Stable left adrenal nodule. This shows low level FDG uptake on prior PET-CT and is indeterminate. Continued attention on follow-up recommended. 5. No new or progressive findings to suggest disease progression. 6. Stable hypoenhancing splenic lesion. Attention on follow-up recommended. 7. Prostatomegaly. 8.  Aortic Atherosclerois (ICD10-170.0)     11/07/2019 - 11/20/2019 Radiation Therapy   Radiation to brain with Dr. Lisbeth Renshaw 11/07/19-11/20/19      CURRENT THERAPY:  -First lineCarboplatin Day 1 and Etopside day 1-3 with immunotherapyatezolizumab(Tecentriq) on day 1, every 3 weeks for 6 cyclesstarting 06/10/19. He is now on maintenance Tecentriq every 3 weeks. -Radiation to brain with Dr. Lisbeth Renshaw 11/07/19-11/20/19  INTERVAL HISTORY:  Jeremy Mack is here for a follow up and treatment. He presents to the clinic alone. He notes he still has left leg pain that is exacerbated by walking. He denies swelling. He notes this has been going on for 2 weeks now. He also notes his copay for Eliquis was $80. He notes he has medicare and [employer]. He has been off Eliquis since 12/1. He has been taking baby aspirin 4 times a day.    REVIEW OF SYSTEMS:   Constitutional: Denies fevers, chills or abnormal weight loss Eyes: Denies blurriness of vision Ears, nose, mouth, throat, and face: Denies mucositis or sore throat Respiratory: Denies  cough, dyspnea or wheezes Cardiovascular: Denies palpitation, chest discomfort or lower extremity swelling Gastrointestinal:  Denies nausea, heartburn or change in bowel habits Skin: Denies abnormal skin rashes MSK: (+) Left leg pain with ambulation Lymphatics: Denies new lymphadenopathy or easy bruising Neurological:Denies numbness, tingling or new weaknesses Behavioral/Psych: Mood is stable, no new changes  All other systems were reviewed with the patient and are negative.  MEDICAL HISTORY:  Past Medical History:  Diagnosis Date  . Cancer ([place])   . GERD (gastroesophageal reflux disease)   . Hyperlipemia   . Hypertension     SURGICAL HISTORY: Past Surgical History:  Procedure Laterality Date  . arm fracture surgery Right   . colon polyps removed    . ESOPHAGOGASTRODUODENOSCOPY (EGD) WITH PROPOFOL N/A 06/14/2018   Procedure: ESOPHAGOGASTRODUODENOSCOPY (EGD) WITH PROPOFOL;  Surgeon: [name], MD;  Location: [facility];  Service: Endoscopy;  Laterality: N/A;  . EUS N/A 06/14/2018   Procedure: ESOPHAGEAL ENDOSCOPIC ULTRASOUND (EUS) RADIAL;  Surgeon: [name], MD;  Location: [facility];  Service: Endoscopy;  Laterality: N/A;  . FINE NEEDLE ASPIRATION N/A 06/14/2018   Procedure: FINE NEEDLE ASPIRATION (FNA) LINEAR;  Surgeon: [name], MD;  Location: [facility]OS[facility];  Service: Endoscopy;  Laterality: N/A;  . IR IMAGING GUIDED PORT INSERTION  08/26/2019  . TONSILLECTOMY      I have reviewed the social history and family history with the patient and they are unchanged from previous note.  ALLERGIES:  has No Known Allergies.  MEDICATIONS:  Current Outpatient Medications  Medication Sig Dispense Refill  . allopurinol (ZYLOPRIM) 300 MG tablet Take 1 tablet (300 mg total) by mouth 2 (two) times daily. 180 tablet 3  . amLODipine (NORVASC) 10 MG tablet Take 1 tablet (10 mg total) by mouth  daily. 90 tablet 3  . aspirin 325 MG tablet Take 650 mg by mouth every 4 (four)  hours as needed for mild pain or headache.    Marland Kitchen atorvastatin (LIPITOR) 40 MG tablet Take 1 tablet (40 mg total) by mouth daily at 6 PM. 90 tablet 3  . lidocaine-prilocaine (EMLA) cream Apply 1 application topically as needed. (Patient taking differently: Apply 1 application topically as needed (port access). ) 30 g 3  . nicotine (NICODERM CQ - DOSED IN MG/24 HOURS) 14 mg/24hr patch Place 1 patch (14 mg total) onto the skin daily. 28 patch 0  . Omega-3 Fatty Acids (FISH OIL) 1000 MG CAPS Take 1,000 mg by mouth daily.     . ondansetron (ZOFRAN) 8 MG tablet Take 1 tablet (8 mg total) by mouth 2 (two) times daily as needed for refractory nausea / vomiting. Start on day 3 after carboplatin chemo. 30 tablet 1  . pantoprazole (PROTONIX) 20 MG tablet Take 1 tablet (20 mg total) by mouth daily. 90 tablet 3  . polyvinyl alcohol (LIQUIFILM TEARS) 1.4 % ophthalmic solution Place 1 drop into both eyes as needed for dry eyes.    Marland Kitchen prochlorperazine (COMPAZINE) 10 MG tablet Take 1 tablet (10 mg total) by mouth every 6 (six) hours as needed (Nausea or vomiting). 30 tablet 1  . traMADol (ULTRAM) 50 MG tablet Take 1 tablet (50 mg total) by mouth every 12 (twelve) hours as needed. (Patient taking differently: Take 50 mg by mouth every 12 (twelve) hours as needed for moderate pain. ) 30 tablet 0  . apixaban (ELIQUIS) 5 MG TABS tablet Take 1 tablet (5 mg total) by mouth 2 (two) times daily. 60 tablet 3   No current facility-administered medications for this visit.    Facility-Administered Medications Ordered in Other Visits  Medication Dose Route Frequency Provider Last Rate Last Dose  . sodium chloride flush (NS) 0.9 % injection 10 mL  10 mL Intracatheter PRN Truitt Merle, MD   10 mL at 11/04/19 1113    PHYSICAL EXAMINATION: ECOG PERFORMANCE STATUS: 1 - Symptomatic but completely ambulatory  Vitals:   11/04/19 0927 11/04/19 0938  BP: (!) 152/102 (!) 149/101  Pulse: (!) 118   Resp: 17   Temp: 98.2 F (36.8 C)    SpO2: 100%    Filed Weights   11/04/19 0927  Weight: 165 lb 11.2 oz (75.2 kg)    GENERAL:alert, no distress and comfortable SKIN: skin color, texture, turgor are normal, no rashes or significant lesions EYES: normal, Conjunctiva are pink and non-injected, sclera clear  NECK: supple, thyroid normal size, non-tender, without nodularity LYMPH:  no palpable lymphadenopathy in the cervical, axillary  LUNGS: clear to auscultation and percussion with normal breathing effort HEART: regular rate & rhythm and no murmurs and no lower extremity edema ABDOMEN:abdomen soft, non-tender and normal bowel sounds Musculoskeletal:no cyanosis of digits and no clubbing  NEURO: alert & oriented x 3 with fluent speech, no focal motor/sensory deficits  LABORATORY DATA:  I have reviewed the data as listed CBC Latest Ref Rng & Units 11/04/2019 10/14/2019 10/01/2019  WBC 4.0 - 10.5 K/uL 7.8 7.7 6.4  Hemoglobin 13.0 - 17.0 g/dL 14.4 13.9 13.1  Hematocrit 39.0 - 52.0 % 43.2 41.8 41.0  Platelets 150 - 400 K/uL 203 223 174     CMP Latest Ref Rng & Units 11/04/2019 10/14/2019 10/01/2019  Glucose 70 - 99 mg/dL 130(H) 136(H) 102(H)  BUN 8 - 23 mg/dL 9 13 12   Creatinine 0.61 -  1.24 mg/dL 1.15 1.24 1.08  Sodium 135 - 145 mmol/L 142 142 139  Potassium 3.5 - 5.1 mmol/L 3.5 4.0 3.8  Chloride 98 - 111 mmol/L 105 106 106  CO2 22 - 32 mmol/L 25 24 22   Calcium 8.9 - 10.3 mg/dL 9.7 9.7 9.2  Total Protein 6.5 - 8.1 g/dL 7.8 8.3(H) 8.1  Total Bilirubin 0.3 - 1.2 mg/dL 0.6 0.5 0.7  Alkaline Phos 38 - 126 U/L 170(H) 155(H) 127(H)  AST 15 - 41 U/L 24 25 18   ALT 0 - 44 U/L 17 19 13       RADIOGRAPHIC STUDIES: I have personally reviewed the radiological images as listed and agreed with the findings in the report. No results found.   ASSESSMENT & PLAN:  Jeremy Mack is a 73 y.o. male with   1.Small Cell Lung Cancer,Right lung primary,Metastatic to liver,left lung, LNs andbone -He was diagnosed in 05/2019 with right  Small cell lung cancer, TTF(+), metastatic to liver, upper abdominal nodal mets,bilateral lung, right mediastinal nodes with diffuse bone mets. -No brain metastasis as seen on 06/14/19 brain MRI.  -Wepreviouslydiscussed given his stage IV metastatic disease, his cancer is no longer curable but still treatable. -He has completed 4 cycles of first lineCarboplatin, etopside and immunotherapy Tecentriq. He is currently on maintenance Tecentriq q3weeks.   -He plans to start Radiation to brain with Dr. Lisbeth Renshaw on 11/07/19 to complete 11/20/19. I will request RT to chest afterward.  -S/p C7 he is tolerating treatment well. Labs reviewed and adequate to proceed with C8 Tecentriq today. Continue every 3 weeks.  -Plan to have PET scan before next f/u in 3 weeks.   2.  Multifocal small stroke -This was found on his brain MRI which was obtained before PCI -He was asymptomatic -On Eliquis. His recent Copay was $80 so he has not taken it since 12/1. He is still on baby aspirin daily.   3. H/o pancreatic abscess  -imaging in 05/2018 suspicious for neoplasm, CA 19-9 was 1 -EUS and FNA per Dr. Ardis Hughs showed inflammation, no malignancy -stable   4. SmokingCessation,H/oalcohol use  -He reports a past history of alcohol use for 10-15 years, he does not drink now. -CT in 05/2018 was concerning for hepatic cirrhosis -Has 30 year smoking history. He still smokes 1/2 ppd. I previously encouraged him to quit smoking as this can further worsen his condition. He is agreeable and interested in help. I previously called in nicotine patch in 05/2019.   5. Enlarged prostate, elevated PSA  -PSA in 03/2019 9.47, up from 6.8 two years ago -enlarged prostate on CT, asymptomatic.  -He was referred to urology by PCP  6. Neck pain (resolved), Recent Left leg pain -He recently developed neck pain, positional,resolved with pain medication, possibly muscular related.  -He has recent Leg Left pain for the past 2 weeks  exacerbated by walking only. Will monitor.   7.Goal of care discussion  -The patient understands the goal of care is palliative. -he is full code now  8. HTN  -On amlodipine -Has been moderately elevated, but at 152/100 today (11/04/19) and has been this range lately in clinic.  -I recommend he get BP cuff to monitor BP at home. Will repeat BP in clinic and if still elevated I will send message to PCP about adjusting his medication to better control his HTN.   9. Financial Support  -He notes $80 copay for Eliquis  -He has medicare and [employer] -I recommend he f/u with financial advocate  Marguarite Arbour for help applying for financial assistance.    PLAN: -Labs reviewed and adequate to proceed with Tecentriq today  -F/u and Tecentriq in 3 weeks with lab and CT CAP w contrast scan a few days before.  -Proceed with brain radiation starting 11/07/19 -I spoke with our financial adovacate, he has grant from [facility], I will send Eliquis to [facility] outpatient pharmacy for his to pick up    No problem-specific [place] notes found for this encounter.   Orders Placed This Encounter  Procedures  . CT Abdomen Pelvis W Contrast    Standing Status:   Future    Standing Expiration Date:   11/03/2020    Order Specific Question:   If indicated for the ordered procedure, I authorize the administration of contrast media per Radiology protocol    Answer:   Yes    Order Specific Question:   Preferred imaging location?    Answer:   [facility]    Order Specific Question:   Is Oral Contrast requested for this exam?    Answer:   Yes, Per Radiology protocol    Order Specific Question:   Radiology Contrast Protocol - do NOT remove file path    Answer:   \\[email or web address]  . CT Chest W Contrast    Standing Status:   Future    Standing Expiration Date:   11/03/2020    Order Specific Question:   If indicated for the ordered procedure, I authorize the administration of  contrast media per Radiology protocol    Answer:   Yes    Order Specific Question:   Preferred imaging location?    Answer:   [facility]    Order Specific Question:   Radiology Contrast Protocol - do NOT remove file path    Answer:   \\[email or web address]   All questions were answered. The patient knows to call the clinic with any problems, questions or concerns. No barriers to learning was detected. I spent 20 minutes counseling the patient face to face. The total time spent in the appointment was 25 minutes and more than 50% was on counseling and review of test results     Truitt Merle, MD 11/04/2019   I, Joslyn Devon, am acting as scribe for Truitt Merle, MD.   I have reviewed the above documentation for accuracy and completeness, and I agree with the above.

## 2019-11-01 NOTE — Progress Notes (Signed)
  Radiation Oncology         [phone] ________________________________  Name: Jeremy Mack MRN: [id number]  Date: 10/31/2019  DOB: Oct 02, 1946  SIMULATION AND TREATMENT PLANNING NOTE   DIAGNOSIS:     ICD-10-CM   1. Primary malignant neoplasm of bronchus of left upper lobe (HCC)  C34.12      The patient presented for simulation for the patient's upcoming course of whole brain radiation treatment. This will be for a course of prophylactic cranial irradiation. The patient was placed in a supine position and a customized thermoplastic head cast was constructed to aid in patient immobilization during the treatment. This complex treatment device will be used on a daily basis. In this fashion a CT scan was obtained through the head and neck region and isocenter was placed near midline within the brain.  The patient will be planned to receive a course of whole brain radiation treatment to a dose of 25 gray in 10 fractions at 2.5 gray per fraction. To accomplish this, 2 customized blocks have been designed which corresponds to left and right whole brain radiation fields. These 2 complex treatment devices will be used on a daily basis during the course of radiation. A complex isodose plan is requested to insure that the target area is adequately covered in to facilitate optimization of the treatment plan. A forward planning technique will also be evaluated to determine if this approach significantly improves the plan.    ________________________________   Jodelle Gross, MD, PhD

## 2019-11-04 ENCOUNTER — Inpatient Hospital Stay: Payer: Medicare PPO

## 2019-11-04 ENCOUNTER — Inpatient Hospital Stay: Payer: Medicare PPO | Attending: Nurse Practitioner

## 2019-11-04 ENCOUNTER — Encounter: Payer: Self-pay | Admitting: Hematology

## 2019-11-04 ENCOUNTER — Other Ambulatory Visit: Payer: Self-pay

## 2019-11-04 ENCOUNTER — Inpatient Hospital Stay (HOSPITAL_BASED_OUTPATIENT_CLINIC_OR_DEPARTMENT_OTHER): Payer: Medicare PPO | Admitting: Hematology

## 2019-11-04 ENCOUNTER — Telehealth: Payer: Self-pay

## 2019-11-04 VITALS — BP 131/97 | HR 112

## 2019-11-04 VITALS — BP 149/101 | HR 118 | Temp 98.2°F | Resp 17 | Ht 71.0 in | Wt 165.7 lb

## 2019-11-04 DIAGNOSIS — C7951 Secondary malignant neoplasm of bone: Secondary | ICD-10-CM | POA: Insufficient documentation

## 2019-11-04 DIAGNOSIS — Z7189 Other specified counseling: Secondary | ICD-10-CM

## 2019-11-04 DIAGNOSIS — C772 Secondary and unspecified malignant neoplasm of intra-abdominal lymph nodes: Secondary | ICD-10-CM | POA: Diagnosis not present

## 2019-11-04 DIAGNOSIS — C3412 Malignant neoplasm of upper lobe, left bronchus or lung: Secondary | ICD-10-CM

## 2019-11-04 DIAGNOSIS — N400 Enlarged prostate without lower urinary tract symptoms: Secondary | ICD-10-CM | POA: Diagnosis not present

## 2019-11-04 DIAGNOSIS — R531 Weakness: Secondary | ICD-10-CM | POA: Insufficient documentation

## 2019-11-04 DIAGNOSIS — Z5112 Encounter for antineoplastic immunotherapy: Secondary | ICD-10-CM | POA: Insufficient documentation

## 2019-11-04 DIAGNOSIS — I10 Essential (primary) hypertension: Secondary | ICD-10-CM | POA: Insufficient documentation

## 2019-11-04 DIAGNOSIS — Z79899 Other long term (current) drug therapy: Secondary | ICD-10-CM | POA: Diagnosis not present

## 2019-11-04 DIAGNOSIS — J439 Emphysema, unspecified: Secondary | ICD-10-CM | POA: Insufficient documentation

## 2019-11-04 DIAGNOSIS — N179 Acute kidney failure, unspecified: Secondary | ICD-10-CM | POA: Diagnosis not present

## 2019-11-04 DIAGNOSIS — C787 Secondary malignant neoplasm of liver and intrahepatic bile duct: Secondary | ICD-10-CM | POA: Insufficient documentation

## 2019-11-04 DIAGNOSIS — E785 Hyperlipidemia, unspecified: Secondary | ICD-10-CM | POA: Diagnosis not present

## 2019-11-04 DIAGNOSIS — F17210 Nicotine dependence, cigarettes, uncomplicated: Secondary | ICD-10-CM | POA: Diagnosis not present

## 2019-11-04 DIAGNOSIS — M79605 Pain in left leg: Secondary | ICD-10-CM | POA: Insufficient documentation

## 2019-11-04 DIAGNOSIS — Z7982 Long term (current) use of aspirin: Secondary | ICD-10-CM | POA: Diagnosis not present

## 2019-11-04 DIAGNOSIS — Z7901 Long term (current) use of anticoagulants: Secondary | ICD-10-CM | POA: Diagnosis not present

## 2019-11-04 DIAGNOSIS — K219 Gastro-esophageal reflux disease without esophagitis: Secondary | ICD-10-CM | POA: Insufficient documentation

## 2019-11-04 DIAGNOSIS — C3490 Malignant neoplasm of unspecified part of unspecified bronchus or lung: Secondary | ICD-10-CM

## 2019-11-04 LAB — CMP (CANCER CENTER ONLY)
ALT: 17 U/L (ref 0–44)
AST: 24 U/L (ref 15–41)
Albumin: 3.6 g/dL (ref 3.5–5.0)
Alkaline Phosphatase: 170 U/L — ABNORMAL HIGH (ref 38–126)
Anion gap: 12 (ref 5–15)
BUN: 9 mg/dL (ref 8–23)
CO2: 25 mmol/L (ref 22–32)
Calcium: 9.7 mg/dL (ref 8.9–10.3)
Chloride: 105 mmol/L (ref 98–111)
Creatinine: 1.15 mg/dL (ref 0.61–1.24)
GFR, Est AFR Am: >60 mL/min (ref >60)
GFR, Estimated: >60 mL/min (ref >60)
Glucose, Bld: 130 mg/dL — ABNORMAL HIGH (ref 70–99)
Potassium: 3.5 mmol/L (ref 3.5–5.1)
Sodium: 142 mmol/L (ref 135–145)
Total Bilirubin: 0.6 mg/dL (ref 0.3–1.2)
Total Protein: 7.8 g/dL (ref 6.5–8.1)

## 2019-11-04 LAB — CBC WITH DIFFERENTIAL (CANCER CENTER ONLY)
Abs Immature Granulocytes: 0.01 10*3/uL (ref 0.00–0.07)
Basophils Absolute: 0.1 10*3/uL (ref 0.0–0.1)
Basophils Relative: 1 %
Eosinophils Absolute: 0.1 10*3/uL (ref 0.0–0.5)
Eosinophils Relative: 1 %
HCT: 43.2 % (ref 39.0–52.0)
Hemoglobin: 14.4 g/dL (ref 13.0–17.0)
Immature Granulocytes: 0 %
Lymphocytes Relative: 38 %
Lymphs Abs: 3 10*3/uL (ref 0.7–4.0)
MCH: 31.7 pg (ref 26.0–34.0)
MCHC: 33.3 g/dL (ref 30.0–36.0)
MCV: 95.2 fL (ref 80.0–100.0)
Monocytes Absolute: 0.7 10*3/uL (ref 0.1–1.0)
Monocytes Relative: 9 %
Neutro Abs: 4 10*3/uL (ref 1.7–7.7)
Neutrophils Relative %: 51 %
Platelet Count: 203 10*3/uL (ref 150–400)
RBC: 4.54 MIL/uL (ref 4.22–5.81)
RDW: 14.9 % (ref 11.5–15.5)
WBC Count: 7.8 10*3/uL (ref 4.0–10.5)
nRBC: 0 % (ref 0.0–0.2)

## 2019-11-04 LAB — TSH: TSH: 2.325 u[IU]/mL (ref 0.320–4.118)

## 2019-11-04 MED ORDER — HEPARIN SOD (PORK) LOCK FLUSH 100 UNIT/ML IV SOLN
500.0000 [IU] | Freq: Once | INTRAVENOUS | Status: AC | PRN
  Administered 2019-11-04: 500 [IU]
  Filled 2019-11-04: qty 5

## 2019-11-04 MED ORDER — SODIUM CHLORIDE 0.9 % IV SOLN
Freq: Once | INTRAVENOUS | Status: AC
  Administered 2019-11-04: 10:00:00 via INTRAVENOUS
  Filled 2019-11-04: qty 250

## 2019-11-04 MED ORDER — SODIUM CHLORIDE 0.9 % IV SOLN
1200.0000 mg | Freq: Once | INTRAVENOUS | Status: AC
  Administered 2019-11-04: 1200 mg via INTRAVENOUS
  Filled 2019-11-04: qty 20

## 2019-11-04 MED ORDER — APIXABAN 5 MG PO TABS: 5.0000 mg | ORAL_TABLET | Freq: Two times a day (BID) | ORAL | 3 refills | Status: AC

## 2019-11-04 MED ORDER — SODIUM CHLORIDE 0.9% FLUSH
10.0000 mL | INTRAVENOUS | Status: DC | PRN
  Administered 2019-11-04: 11:00:00 10 mL
  Filled 2019-11-04: qty 10

## 2019-11-04 MED FILL — ELIQUIS 5 MG TABLET: 5 | 30 days supply | Qty: 60 | Fill #0

## 2019-11-04 NOTE — Telephone Encounter (Signed)
Left voice message for patient that [name] is sending a script for Eliquis to [facility] where he has grant money so it will be covered.

## 2019-11-04 NOTE — Telephone Encounter (Signed)
Called patient this morning and left message.

## 2019-11-04 NOTE — Progress Notes (Signed)
Okay to treat D1C8 Tecentriq with pulse 112 per [name].

## 2019-11-04 NOTE — Patient Instructions (Signed)
[facility] Discharge Instructions for Patients Receiving Chemotherapy  Today you received the following chemotherapy agents: Tecentriq  To help prevent nausea and vomiting after your treatment, we encourage you to take your nausea medication as directed.   If you develop nausea and vomiting that is not controlled by your nausea medication, call the clinic.   BELOW ARE SYMPTOMS THAT SHOULD BE REPORTED IMMEDIATELY:  *FEVER GREATER THAN 100.5 F  *CHILLS WITH OR WITHOUT FEVER  NAUSEA AND VOMITING THAT IS NOT CONTROLLED WITH YOUR NAUSEA MEDICATION  *UNUSUAL SHORTNESS OF BREATH  *UNUSUAL BRUISING OR BLEEDING  TENDERNESS IN MOUTH AND THROAT WITH OR WITHOUT PRESENCE OF ULCERS  *URINARY PROBLEMS  *BOWEL PROBLEMS  UNUSUAL RASH Items with * indicate a potential emergency and should be followed up as soon as possible.  Feel free to call the clinic should you have any questions or concerns. The clinic phone number is [phone].  Please show the CHEMO ALERT CARD at check-in to the Emergency Department and triage nurse.   

## 2019-11-06 ENCOUNTER — Telehealth: Payer: Self-pay | Admitting: Hematology

## 2019-11-06 ENCOUNTER — Encounter: Payer: Self-pay | Admitting: *Deleted

## 2019-11-06 ENCOUNTER — Telehealth: Payer: Self-pay

## 2019-11-06 NOTE — Telephone Encounter (Signed)
Confirmed with patient 12/29 appointments. Patient also on schedule for ct scan 12/29, however scan should be done prior to 12/29. Per patient ok to move scan from 12/29 to 12/28. Patient made aware I would call back to confirm new scan date.  Called again and left message for patient re lab/ct 12/28, with instruction and prep pick up information. Confirmed again 12/29 appointments and that patient can get ct prep at next xrt visit. Also added comment to xrt visit to send patient for ct prep.  Patient wife expressed concerns re issues patient is having after stroke. Message to [place].

## 2019-11-06 NOTE — Telephone Encounter (Signed)
Left voice message for patient's wife as I had received a message from scheduling that she has some concerns.  Asked her to call me back and let me know what her concerns are.

## 2019-11-06 NOTE — Telephone Encounter (Signed)
Malachy Mood, could you call his wife to see what her concerns are? Thanks   Truitt Merle MD

## 2019-11-07 ENCOUNTER — Ambulatory Visit
Admission: RE | Admit: 2019-11-07 | Discharge: 2019-11-07 | Disposition: A | Discharge status: Home / Self Care | Payer: Medicare PPO | Source: Ambulatory Visit | Attending: Radiation Oncology | Admitting: Radiation Oncology

## 2019-11-07 ENCOUNTER — Ambulatory Visit: Payer: Self-pay | Admitting: Pharmacist

## 2019-11-07 ENCOUNTER — Other Ambulatory Visit: Payer: Self-pay

## 2019-11-07 DIAGNOSIS — Z510 Encounter for antineoplastic radiation therapy: Secondary | ICD-10-CM | POA: Diagnosis not present

## 2019-11-08 ENCOUNTER — Ambulatory Visit
Admission: RE | Admit: 2019-11-08 | Discharge: 2019-11-08 | Disposition: A | Discharge status: Home / Self Care | Payer: Medicare PPO | Source: Ambulatory Visit | Attending: Radiation Oncology | Admitting: Radiation Oncology

## 2019-11-08 ENCOUNTER — Ambulatory Visit: Payer: Self-pay | Admitting: Pharmacist

## 2019-11-08 ENCOUNTER — Other Ambulatory Visit: Payer: Self-pay

## 2019-11-08 ENCOUNTER — Other Ambulatory Visit: Payer: Self-pay | Admitting: Pharmacist

## 2019-11-08 DIAGNOSIS — Z510 Encounter for antineoplastic radiation therapy: Secondary | ICD-10-CM | POA: Diagnosis not present

## 2019-11-08 NOTE — Patient Outreach (Signed)
[facility]) Care Management  11/08/2019  Jeremy Mack January 12, 1946 [id number]   Patient was called medication assistance. Unfortunately, he did not answer the phone. A male answered the phone this morning and said the patient was at a doctor's appointment. Called the patient back this afternoon and he did not answer the phone. HIPAA compliant message was left on his voicemail at the home number. The number listed as his cell number did not have a voicemail set up.  Today's call was the second unsuccessful phone call.  Plan: Call patient back in 7-10 business days.  [name], PharmD, [place] Clinical Pharmacist [phone]

## 2019-11-11 ENCOUNTER — Ambulatory Visit: Payer: Medicare PPO

## 2019-11-12 ENCOUNTER — Other Ambulatory Visit: Payer: Self-pay | Admitting: Pharmacist

## 2019-11-12 ENCOUNTER — Other Ambulatory Visit: Payer: Self-pay

## 2019-11-12 ENCOUNTER — Ambulatory Visit
Admission: RE | Admit: 2019-11-12 | Discharge: 2019-11-12 | Disposition: A | Discharge status: Home / Self Care | Payer: Medicare PPO | Source: Ambulatory Visit | Attending: Radiation Oncology | Admitting: Radiation Oncology

## 2019-11-12 ENCOUNTER — Inpatient Hospital Stay: Payer: Self-pay | Admitting: Adult Health

## 2019-11-12 DIAGNOSIS — Z510 Encounter for antineoplastic radiation therapy: Secondary | ICD-10-CM | POA: Diagnosis not present

## 2019-11-12 NOTE — Patient Outreach (Signed)
[place] [facility]) Care Management  11/12/2019  Jeremy Mack [birth year] [id number]   Patient called and left a message on my voicemail stating that one of his medications was going to cost him $80.00. Unfortunately,when I called him back, he did not answer. HIPAA compliant message was left on his home phone. The number listed as a cell just rang without a voicemail pick up.  Since we are at the end of the year, most patient assistance programs have stopped accepting applications for the 3295 benefit year. Patient can be assisted for 2021. His pharmacy benefit will start over in January as well, and he should have a regular copay since he will not be in the donut hole. It is unclear if he has a deductible.  Plan: Call patient back in 5-7 business days.  [name], PharmD, [place] Clinical Pharmacist [phone]

## 2019-11-12 NOTE — Patient Outreach (Signed)
[facility]) Care Management  [place]  11/12/2019   Abdishakur Gottschall 11-03-1946 [id number]  Subjective: Telephone call to patient for follow up. Patient has not spoken with pharmacist Alwyn Ren.  Patient given Katina's contact information and encouraged to call her for medication assistance.  Patient reporting increased weakness with getting treatments daily.  He has discussed this with the cancer doctor as well. Discussed with patient ways to conserve energy and suggested that his wife transport him to appointments until he feels better.  He verbalized understanding and denies any further needs. Patient has given permission for CM to speak with wife in the future if need be.  Wife also listed on  DPR.    Objective:   Encounter Medications:  Outpatient Encounter Medications as of 11/12/2019  Medication Sig  . allopurinol (ZYLOPRIM) 300 MG tablet Take 1 tablet (300 mg total) by mouth 2 (two) times daily.  Marland Kitchen amLODipine (NORVASC) 10 MG tablet Take 1 tablet (10 mg total) by mouth daily.  Marland Kitchen apixaban (ELIQUIS) 5 MG TABS tablet Take 1 tablet (5 mg total) by mouth 2 (two) times daily.  Marland Kitchen aspirin 325 MG tablet Take 650 mg by mouth every 4 (four) hours as needed for mild pain or headache.  Marland Kitchen atorvastatin (LIPITOR) 40 MG tablet Take 1 tablet (40 mg total) by mouth daily at 6 PM.  . lidocaine-prilocaine (EMLA) cream Apply 1 application topically as needed. (Patient taking differently: Apply 1 application topically as needed (port access). )  . nicotine (NICODERM CQ - DOSED IN MG/24 HOURS) 14 mg/24hr patch Place 1 patch (14 mg total) onto the skin daily.  . Omega-3 Fatty Acids (FISH OIL) 1000 MG CAPS Take 1,000 mg by mouth daily.   . ondansetron (ZOFRAN) 8 MG tablet Take 1 tablet (8 mg total) by mouth 2 (two) times daily as needed for refractory nausea / vomiting. Start on day 3 after carboplatin chemo.  . pantoprazole (PROTONIX) 20 MG tablet Take 1 tablet (20 mg total) by mouth daily.   . polyvinyl alcohol (LIQUIFILM TEARS) 1.4 % ophthalmic solution Place 1 drop into both eyes as needed for dry eyes.  Marland Kitchen prochlorperazine (COMPAZINE) 10 MG tablet Take 1 tablet (10 mg total) by mouth every 6 (six) hours as needed (Nausea or vomiting).  . traMADol (ULTRAM) 50 MG tablet Take 1 tablet (50 mg total) by mouth every 12 (twelve) hours as needed. (Patient taking differently: Take 50 mg by mouth every 12 (twelve) hours as needed for moderate pain. )   No facility-administered encounter medications on file as of 11/12/2019.    Functional Status:  In your present state of health, do you have any difficulty performing the following activities: 10/07/2019 10/01/2019  Hearing? N N  Vision? N N  Difficulty concentrating or making decisions? N N  Walking or climbing stairs? N N  Dressing or bathing? N N  Doing errands, shopping? N N  Preparing Food and eating ? N -  Using the Toilet? N -  In the past six months, have you accidently leaked urine? N -  Do you have problems with loss of bowel control? N -  Managing your Medications? N -  Managing your Finances? N -  Housekeeping or managing your Housekeeping? N -  Some recent data might be hidden    Fall/Depression Screening: Fall Risk  10/07/2019 09/09/2016  Falls in the past year? 0 Yes  Number falls in past yr: - 1  Injury with Fall? - Yes  PHQ 2/9 Scores 10/15/2019 09/09/2016  PHQ - 2 Score 0 0    Assessment: Patient currently having daily radiation treatments.  Patient having some increased weakness.     Plan:  [facility] CM Care Plan Problem One     Most Recent Value  Care Plan Problem One  Recent hospitalization related to stroke  Role Documenting the Problem One  Care Management Telephonic Coordinator  Care Plan for Problem One  Active  [facility] Long Term Goal   Patient will not experience unplanned hospital readmission within the next 31 days  [facility] Long Term Goal Start Date  10/07/19  [facility] Long Term Goal Met Date  11/12/19  [facility]  CM Short Term Goal #1   Patient will report medthods to conserve energy within 21 days.  [facility] CM Short Term Goal #1 Start Date  11/12/19  Interventions for Short Term Goal #1  RN CM discussed with patient ways to conserve energy and possiblity of wife transporting to treatments.       RN CM will contact patient again in the month of December and patient agreeable.    [name], RN, MSN [place] Management Care Management Coordinator Direct Line [phone] Cell [phone] Toll Free: [phone]  Fax: [phone]

## 2019-11-13 ENCOUNTER — Ambulatory Visit
Admission: RE | Admit: 2019-11-13 | Discharge: 2019-11-13 | Disposition: A | Discharge status: Home / Self Care | Payer: Medicare PPO | Source: Ambulatory Visit | Attending: Radiation Oncology | Admitting: Radiation Oncology

## 2019-11-13 ENCOUNTER — Other Ambulatory Visit: Payer: Self-pay

## 2019-11-13 DIAGNOSIS — Z510 Encounter for antineoplastic radiation therapy: Secondary | ICD-10-CM | POA: Diagnosis not present

## 2019-11-14 ENCOUNTER — Other Ambulatory Visit: Payer: Self-pay

## 2019-11-14 ENCOUNTER — Ambulatory Visit
Admission: RE | Admit: 2019-11-14 | Discharge: 2019-11-14 | Disposition: A | Discharge status: Home / Self Care | Payer: Medicare PPO | Source: Ambulatory Visit | Attending: Radiation Oncology | Admitting: Radiation Oncology

## 2019-11-14 DIAGNOSIS — Z510 Encounter for antineoplastic radiation therapy: Secondary | ICD-10-CM | POA: Diagnosis not present

## 2019-11-15 ENCOUNTER — Other Ambulatory Visit: Payer: Self-pay

## 2019-11-15 ENCOUNTER — Ambulatory Visit
Admission: RE | Admit: 2019-11-15 | Discharge: 2019-11-15 | Disposition: A | Discharge status: Home / Self Care | Payer: Medicare PPO | Source: Ambulatory Visit | Attending: Radiation Oncology | Admitting: Radiation Oncology

## 2019-11-15 DIAGNOSIS — Z510 Encounter for antineoplastic radiation therapy: Secondary | ICD-10-CM | POA: Diagnosis not present

## 2019-11-18 ENCOUNTER — Telehealth: Payer: Self-pay

## 2019-11-18 ENCOUNTER — Ambulatory Visit
Admission: RE | Admit: 2019-11-18 | Discharge: 2019-11-18 | Disposition: A | Discharge status: Home / Self Care | Payer: Medicare PPO | Source: Ambulatory Visit | Attending: Radiation Oncology | Admitting: Radiation Oncology

## 2019-11-18 ENCOUNTER — Inpatient Hospital Stay: Payer: Self-pay | Admitting: Adult Health

## 2019-11-18 ENCOUNTER — Other Ambulatory Visit: Payer: Self-pay

## 2019-11-18 DIAGNOSIS — Z510 Encounter for antineoplastic radiation therapy: Secondary | ICD-10-CM | POA: Diagnosis not present

## 2019-11-18 NOTE — Telephone Encounter (Signed)
Patient was a no call/no show for their appointment today.   

## 2019-11-18 NOTE — Progress Notes (Deleted)
[facility] [address] [place]. [place] [address] [phone]       HOSPITAL FOLLOW UP NOTE  Jeremy Mack Date of Birth:  1946/09/23 Medical Record Number:  [id number]   Reason for Referral:  hospital stroke follow up    CHIEF COMPLAINT:  No chief complaint on file.   HPI: Jeremy Mack being seen today for in office hospital follow-up regarding right thalamic and cerebellar infarcts secondary to hypercoagulable state from advanced lung cancer with mets on 10/01/2019.  History obtained from *** and chart review. Reviewed all radiology images and labs personally.  Jeremy Mack is a 73 y.o. male with history of stage IV small cell lung cancer, s/p chemotherapy, with cervical spine, liver and bone metastases  who presented to [facility] ED on 10/01/2019 after MRI done to look for mets showed a R thalamic and R cerebellar infarct. He reports LLE weakness while walking, no other sx.  stroke work-up revealed small right thalamic and small right cerebellar infarcts as evidenced on MRI, two different vascular territory distribution likely embolic secondary to hypercoagulability from advanced lung cancer with mets.  MRI did not show evidence of brain mets but did show C-spine mets as previous.  MRA negative LVO but did show right ICA siphon with moderate stenosis.  Carotid Doppler unremarkable.  2D echo showed an EF of 60 to 65% without cardiac source of embolus identified.  Lower extremity venous Doppler negative for DVT.  LDL 152.  A1c 4.9.  Previously on aspirin 325 mg daily and recommended initiating Eliquis 5 mg twice daily due to likely hypercoagulable state secondary to lung cancer with mets to cervical spine, liver and bone currently receiving chemo.  HTN stable.  LDL 152 and initiated atorvastatin 40 mg daily.  No history of evidence of DM with A1c 4.9.  Current tobacco use with smoking cessation counseling provided.  Other stroke risk factors include advanced  age, EtOH use and substance abuse with UDS positive for THC.  No prior history of stroke.  He was discharged home in stable condition recommendation of outpatient OT.     ROS:   14 system review of systems performed and negative with exception of ***  PMH:  Past Medical History:  Diagnosis Date  . Cancer ([place])   . GERD (gastroesophageal reflux disease)   . Hyperlipemia   . Hypertension     PSH:  Past Surgical History:  Procedure Laterality Date  . arm fracture surgery Right   . colon polyps removed    . ESOPHAGOGASTRODUODENOSCOPY (EGD) WITH PROPOFOL N/A 06/14/2018   Procedure: ESOPHAGOGASTRODUODENOSCOPY (EGD) WITH PROPOFOL;  Surgeon: [name], MD;  Location: [facility] END[facility]COPY;  Service: Endoscopy;  Laterality: N/A;  . EUS N/A 06/14/2018   Procedure: ESOPHAGEAL ENDOSCOPIC ULTRASOUND (EUS) RADIAL;  Surgeon: [name], MD;  Location: [facility]COPY;  Service: Endoscopy;  Laterality: N/A;  . FINE NEEDLE ASPIRATION N/A 06/14/2018   Procedure: FINE NEEDLE ASPIRATION (FNA) LINEAR;  Surgeon: [name], MD;  Location: [facility] END[facility]COPY;  Service: Endoscopy;  Laterality: N/A;  . IR IMAGING GUIDED PORT INSERTION  08/26/2019  . TONSILLECTOMY      Social History:  Social History   Socioeconomic History  . Marital status: Married    Spouse name: Not on file  . Number of children: 4  . Years of education: 36  . Highest education level: Not on file  Occupational History  . Occupation: Retired  Tobacco Use  . Smoking status: Current  Every Day Smoker    Packs/day: 0.50    Types: Cigars  . Smokeless tobacco: Never Used  . Tobacco comment: Trying to quit  Substance and Sexual Activity  . Alcohol use: Yes    Alcohol/week: 0.0 standard drinks    Comment: Occasionally  . Drug use: No  . Sexual activity: Not on file  Other Topics Concern  . Not on file  Social History Narrative   Fun: Swimming, and outdoor sports   Denies religious beliefs effecting health care.    Social  Determinants of Health   Financial Resource Strain:   . Difficulty of Paying Living Expenses: Not on file  Food Insecurity: No Food Insecurity  . Worried About Charity fundraiser in the Last Year: Never true  . Ran Out of Food in the Last Year: Never true  Transportation Needs: No Transportation Needs  . Lack of Transportation (Medical): No  . Lack of Transportation (Non-Medical): No  Physical Activity:   . Days of Exercise per Week: Not on file  . Minutes of Exercise per Session: Not on file  Stress:   . Feeling of Stress : Not on file  Social Connections:   . Frequency of Communication with Friends and Family: Not on file  . Frequency of Social Gatherings with Friends and Family: Not on file  . Attends Religious Services: Not on file  . Active Member of Clubs or Organizations: Not on file  . Attends Archivist Meetings: Not on file  . Marital Status: Not on file  Intimate Partner Violence:   . Fear of Current or Ex-Partner: Not on file  . Emotionally Abused: Not on file  . Physically Abused: Not on file  . Sexually Abused: Not on file    Family History:  Family History  Problem Relation Age of Onset  . Diabetes Mother   . Diabetes Maternal Grandmother     Medications:   Current Outpatient Medications on File Prior to Visit  Medication Sig Dispense Refill  . allopurinol (ZYLOPRIM) 300 MG tablet Take 1 tablet (300 mg total) by mouth 2 (two) times daily. 180 tablet 3  . amLODipine (NORVASC) 10 MG tablet Take 1 tablet (10 mg total) by mouth daily. 90 tablet 3  . apixaban (ELIQUIS) 5 MG TABS tablet Take 1 tablet (5 mg total) by mouth 2 (two) times daily. 60 tablet 3  . aspirin 325 MG tablet Take 650 mg by mouth every 4 (four) hours as needed for mild pain or headache.    Marland Kitchen atorvastatin (LIPITOR) 40 MG tablet Take 1 tablet (40 mg total) by mouth daily at 6 PM. 90 tablet 3  . lidocaine-prilocaine (EMLA) cream Apply 1 application topically as needed. (Patient taking  differently: Apply 1 application topically as needed (port access). ) 30 g 3  . nicotine (NICODERM CQ - DOSED IN MG/24 HOURS) 14 mg/24hr patch Place 1 patch (14 mg total) onto the skin daily. 28 patch 0  . Omega-3 Fatty Acids (FISH OIL) 1000 MG CAPS Take 1,000 mg by mouth daily.     . ondansetron (ZOFRAN) 8 MG tablet Take 1 tablet (8 mg total) by mouth 2 (two) times daily as needed for refractory nausea / vomiting. Start on day 3 after carboplatin chemo. 30 tablet 1  . pantoprazole (PROTONIX) 20 MG tablet Take 1 tablet (20 mg total) by mouth daily. 90 tablet 3  . polyvinyl alcohol (LIQUIFILM TEARS) 1.4 % ophthalmic solution Place 1 drop into both eyes as needed  for dry eyes.    Marland Kitchen prochlorperazine (COMPAZINE) 10 MG tablet Take 1 tablet (10 mg total) by mouth every 6 (six) hours as needed (Nausea or vomiting). 30 tablet 1  . traMADol (ULTRAM) 50 MG tablet Take 1 tablet (50 mg total) by mouth every 12 (twelve) hours as needed. (Patient taking differently: Take 50 mg by mouth every 12 (twelve) hours as needed for moderate pain. ) 30 tablet 0   No current facility-administered medications on file prior to visit.    Allergies:  No Known Allergies   Physical Exam  There were no vitals filed for this visit. There is no height or weight on file to calculate BMI. No exam data present  Depression screen [facility] 2/9 10/15/2019  Decreased Interest 0  Down, Depressed, Hopeless 0  PHQ - 2 Score 0     General: well developed, well nourished, seated, in no evident distress Head: head normocephalic and atraumatic.   Neck: supple with no carotid or supraclavicular bruits Cardiovascular: regular rate and rhythm, no murmurs Musculoskeletal: no deformity Skin:  no rash/petichiae Vascular:  Normal pulses all extremities   Neurologic Exam Mental Status: Awake and fully alert. Oriented to place and time. Recent and remote memory intact. Attention span, concentration and fund of knowledge appropriate. Mood and  affect appropriate.  Cranial Nerves: Fundoscopic exam reveals sharp disc margins. Pupils equal, briskly reactive to light. Extraocular movements full without nystagmus. Visual fields full to confrontation. Hearing intact. Facial sensation intact. Face, tongue, palate moves normally and symmetrically.  Motor: Normal bulk and tone. Normal strength in all tested extremity muscles. Sensory.: intact to touch , pinprick , position and vibratory sensation.  Coordination: Rapid alternating movements normal in all extremities. Finger-to-nose and heel-to-shin performed accurately bilaterally. Gait and Station: Arises from chair without difficulty. Stance is normal. Gait demonstrates normal stride length and balance Reflexes: 1+ and symmetric. Toes downgoing.     NIHSS  *** Modified Rankin  *** CHA2DS2-VASc *** HAS-BLED ***   Diagnostic Data (Labs, Imaging, Testing)  CT HEAD WO CONTRAST ***  CT ANGIO HEAD W OR WO CONTRAST CT ANGIO NECK W OR WO CONTRAST ***  MR BRAIN WO CONTRAST 09/30/2019 IMPRESSION: Motion degraded study demonstrates small foci of acute infarction involving the lateral right thalamus/posterior limb of the internal capsule and right cerebellar hemisphere. No evidence of intracranial metastatic disease. Cervical spine metastatic disease as seen previously.  MR MRA HEAD  10/02/19 IMPRESSION: 1. Negative for large vessel occlusion. No posterior circulation stenosis, although there is mild irregularity of the right PCA in the region of the right thalamostriate artery. 2. Non dominant right ICA siphon with atherosclerosis and up to moderate right siphon stenosis in the cavernous segment. 3. Otherwise negative anterior circulation.  ECHOCARDIOGRAM 10/02/2019 IMPRESSIONS  1. Left ventricular ejection fraction, by visual estimation, is 60 to 65%. The left ventricle has normal function. There is mildly increased left ventricular hypertrophy of the basal septum.  2. Left  ventricular diastolic parameters are consistent with Grade I diastolic dysfunction (impaired relaxation).  3. Global right ventricle has normal systolic function.The right ventricular size is normal. No increase in right ventricular wall thickness.  4. Left atrial size was normal.  5. Right atrial size was normal.  6. The mitral valve is normal in structure. No evidence of mitral valve regurgitation. No evidence of mitral stenosis.  7. The tricuspid valve is normal in structure. Tricuspid valve regurgitation is not demonstrated.  8. The aortic valve is normal in structure. Aortic valve  regurgitation is not visualized. No evidence of aortic valve sclerosis or stenosis.  9. The pulmonic valve was normal in structure. Pulmonic valve regurgitation is not visualized. 10. The inferior vena cava is normal in size with greater than 50% respiratory variability, suggesting right atrial pressure of 3 mmHg. 11. There is Moderate thickening of the aortic valve. 12. There is Mild calcification of the aortic valve. 13. The interatrial septum appears to be lipomatous.      ASSESSMENT: Jeremy Mack is a 73 y.o. year old male presented on 10/01/2019 after MRI obtained to look for possible brain mets showed a right thalamic and right cerebellar infarct likely secondary to hypercoagulability from advanced lung cancer with mets. Vascular risk factors include stage IV small cell lung cancer status post chemotherapy with cervical spine, liver and bone mets, HTN, HLD, tobacco use and substance abuse.     PLAN:  1. R thalamic and cerebellar infarct : Continue Eliquis (apixaban) daily  and lipitor  for secondary stroke prevention. Maintain strict control of hypertension with blood pressure goal below 130/90, diabetes with hemoglobin A1c goal below 6.5% and cholesterol with LDL cholesterol (bad cholesterol) goal below 70 mg/dL.  I also advised the patient to eat a healthy diet with plenty of whole grains, cereals, fruits  and vegetables, exercise regularly with at least 30 minutes of continuous activity daily and maintain ideal body weight. 2. HTN: Advised to continue current treatment regimen.  Today's BP ***.  Advised to continue to monitor at home along with continued follow-up with PCP for management 3. HLD: Advised to continue current treatment regimen along with continued follow-up with PCP for future prescribing and monitoring of lipid panel 4. Stage IV lung cancer with mets: Continue to follow with oncology    Follow up in *** or call earlier if needed   Greater than 50% of time during this 45 minute visit was spent on counseling, explanation of diagnosis of ***, reviewing risk factor management of ***, planning of further management along with potential future management, and discussion with patient and family answering all questions.    Frann Rider, AGNP-BC  [facility] [address] [place], [place] [address]  Phone [phone] Fax [phone] Note: This document was prepared with digital dictation and possible smart phrase technology. Any transcriptional errors that result from this process are unintentional.

## 2019-11-19 ENCOUNTER — Ambulatory Visit: Payer: Self-pay | Admitting: Pharmacist

## 2019-11-19 ENCOUNTER — Ambulatory Visit
Admission: RE | Admit: 2019-11-19 | Discharge: 2019-11-19 | Disposition: A | Discharge status: Home / Self Care | Payer: Medicare PPO | Source: Ambulatory Visit | Attending: Radiation Oncology | Admitting: Radiation Oncology

## 2019-11-19 ENCOUNTER — Other Ambulatory Visit: Payer: Self-pay

## 2019-11-19 ENCOUNTER — Other Ambulatory Visit: Payer: Self-pay | Admitting: Pharmacist

## 2019-11-19 DIAGNOSIS — Z510 Encounter for antineoplastic radiation therapy: Secondary | ICD-10-CM | POA: Diagnosis not present

## 2019-11-19 NOTE — Patient Outreach (Signed)
[facility] [facility]) Care Management  11/19/2019  [name] 01-Sep-1946 [id number]   Patient was called medication assistance. Unfortunately, he did not answer the phone. HIPAA compliant message was left on his voicemail at the home number. The number listed as his cell number did not have a voicemail set up.  Patient was sent an unsuccessful contact letter on 10/30/2019   Plan: Call patient back in 3-4 weeks.  [name], PharmD, [place] Clinical Pharmacist [phone]

## 2019-11-20 ENCOUNTER — Ambulatory Visit
Admission: RE | Admit: 2019-11-20 | Discharge: 2019-11-20 | Disposition: A | Discharge status: Home / Self Care | Payer: Medicare PPO | Source: Ambulatory Visit | Attending: Radiation Oncology | Admitting: Radiation Oncology

## 2019-11-20 ENCOUNTER — Other Ambulatory Visit: Payer: Self-pay

## 2019-11-20 ENCOUNTER — Encounter: Payer: Self-pay | Admitting: Adult Health

## 2019-11-20 ENCOUNTER — Ambulatory Visit: Payer: Medicare PPO

## 2019-11-20 DIAGNOSIS — Z510 Encounter for antineoplastic radiation therapy: Secondary | ICD-10-CM | POA: Diagnosis not present

## 2019-11-21 ENCOUNTER — Encounter: Payer: Self-pay | Admitting: Radiation Oncology

## 2019-11-21 ENCOUNTER — Ambulatory Visit
Admission: RE | Admit: 2019-11-21 | Discharge: 2019-11-21 | Disposition: A | Discharge status: Home / Self Care | Payer: Medicare PPO | Source: Ambulatory Visit | Attending: Radiation Oncology | Admitting: Radiation Oncology

## 2019-11-21 ENCOUNTER — Other Ambulatory Visit: Payer: Self-pay

## 2019-11-21 DIAGNOSIS — Z510 Encounter for antineoplastic radiation therapy: Secondary | ICD-10-CM | POA: Diagnosis not present

## 2019-11-25 ENCOUNTER — Inpatient Hospital Stay: Payer: Medicare PPO

## 2019-11-25 ENCOUNTER — Ambulatory Visit (HOSPITAL_COMMUNITY): Admission: RE | Admit: 2019-11-25 | Payer: Medicare PPO | Source: Ambulatory Visit

## 2019-11-25 ENCOUNTER — Other Ambulatory Visit: Payer: Self-pay

## 2019-11-25 NOTE — Patient Outreach (Signed)
[facility]) Care Management  11/25/2019  Jeremy Mack 30-Aug-1946 [id number]   Telephone call to patient for follow up.  No answer.  HIPAA compliant voice message left.    Plan: RN CM will attempt patient again within 4 business days and send letter.  [name], RN, MSN [place] Management Care Management Coordinator Direct Line [phone] Cell [phone] Toll Free: [phone]  Fax: [phone]

## 2019-11-26 ENCOUNTER — Telehealth: Payer: Self-pay | Admitting: *Deleted

## 2019-11-26 ENCOUNTER — Inpatient Hospital Stay (HOSPITAL_BASED_OUTPATIENT_CLINIC_OR_DEPARTMENT_OTHER): Payer: Medicare PPO | Admitting: Hematology

## 2019-11-26 ENCOUNTER — Ambulatory Visit (HOSPITAL_COMMUNITY): Payer: Medicare PPO

## 2019-11-26 ENCOUNTER — Encounter: Payer: Self-pay | Admitting: Hematology

## 2019-11-26 ENCOUNTER — Other Ambulatory Visit: Payer: Self-pay

## 2019-11-26 ENCOUNTER — Inpatient Hospital Stay: Payer: Medicare PPO | Admitting: Hematology

## 2019-11-26 ENCOUNTER — Inpatient Hospital Stay: Payer: Medicare PPO

## 2019-11-26 DIAGNOSIS — C3490 Malignant neoplasm of unspecified part of unspecified bronchus or lung: Secondary | ICD-10-CM

## 2019-11-26 DIAGNOSIS — C3412 Malignant neoplasm of upper lobe, left bronchus or lung: Secondary | ICD-10-CM

## 2019-11-26 DIAGNOSIS — I10 Essential (primary) hypertension: Secondary | ICD-10-CM

## 2019-11-26 DIAGNOSIS — Z7189 Other specified counseling: Secondary | ICD-10-CM

## 2019-11-26 LAB — CBC WITH DIFFERENTIAL (CANCER CENTER ONLY)
Abs Immature Granulocytes: 0.13 10*3/uL — ABNORMAL HIGH (ref 0.00–0.07)
Basophils Absolute: 0.1 10*3/uL (ref 0.0–0.1)
Basophils Relative: 1 %
Eosinophils Absolute: 0 10*3/uL (ref 0.0–0.5)
Eosinophils Relative: 0 %
HCT: 48.6 % (ref 39.0–52.0)
Hemoglobin: 16.4 g/dL (ref 13.0–17.0)
Immature Granulocytes: 1 %
Lymphocytes Relative: 17 %
Lymphs Abs: 1.7 10*3/uL (ref 0.7–4.0)
MCH: 30.5 pg (ref 26.0–34.0)
MCHC: 33.7 g/dL (ref 30.0–36.0)
MCV: 90.3 fL (ref 80.0–100.0)
Monocytes Absolute: 0.7 10*3/uL (ref 0.1–1.0)
Monocytes Relative: 7 %
Neutro Abs: 7.3 10*3/uL (ref 1.7–7.7)
Neutrophils Relative %: 74 %
Platelet Count: 178 10*3/uL (ref 150–400)
RBC: 5.38 MIL/uL (ref 4.22–5.81)
RDW: 14.6 % (ref 11.5–15.5)
WBC Count: 9.9 10*3/uL (ref 4.0–10.5)
nRBC: 0 % (ref 0.0–0.2)

## 2019-11-26 LAB — CMP (CANCER CENTER ONLY)
ALT: 34 U/L (ref 0–44)
AST: 66 U/L — ABNORMAL HIGH (ref 15–41)
Albumin: 4 g/dL (ref 3.5–5.0)
Alkaline Phosphatase: 227 U/L — ABNORMAL HIGH (ref 38–126)
Anion gap: 14 (ref 5–15)
BUN: 45 mg/dL — ABNORMAL HIGH (ref 8–23)
CO2: 22 mmol/L (ref 22–32)
Calcium: 10.9 mg/dL — ABNORMAL HIGH (ref 8.9–10.3)
Chloride: 102 mmol/L (ref 98–111)
Creatinine: 1.87 mg/dL — ABNORMAL HIGH (ref 0.61–1.24)
GFR, Est AFR Am: 41 mL/min — ABNORMAL LOW (ref >60)
GFR, Estimated: 35 mL/min — ABNORMAL LOW (ref >60)
Glucose, Bld: 127 mg/dL — ABNORMAL HIGH (ref 70–99)
Potassium: 4.5 mmol/L (ref 3.5–5.1)
Sodium: 138 mmol/L (ref 135–145)
Total Bilirubin: 1.2 mg/dL (ref 0.3–1.2)
Total Protein: 9 g/dL — ABNORMAL HIGH (ref 6.5–8.1)

## 2019-11-26 LAB — TSH: TSH: 1.575 u[IU]/mL (ref 0.320–4.118)

## 2019-11-26 MED ORDER — TRAMADOL HCL 50 MG PO TABS: 50.0000 mg | ORAL_TABLET | Freq: Four times a day (QID) | ORAL | 0 refills | Status: AC | PRN

## 2019-11-26 MED ORDER — SODIUM CHLORIDE 0.9% FLUSH
10.0000 mL | INTRAVENOUS | Status: DC | PRN
  Administered 2019-11-26: 18:00:00 10 mL
  Filled 2019-11-26: qty 10

## 2019-11-26 MED ORDER — SODIUM CHLORIDE 0.9 % IV SOLN
INTRAVENOUS | Status: AC
  Filled 2019-11-26 (×2): qty 250

## 2019-11-26 MED ORDER — HEPARIN SOD (PORK) LOCK FLUSH 100 UNIT/ML IV SOLN
500.0000 [IU] | Freq: Once | INTRAVENOUS | Status: AC | PRN
  Administered 2019-11-26: 18:00:00 500 [IU]
  Filled 2019-11-26: qty 5

## 2019-11-26 NOTE — Progress Notes (Signed)
[facility]   [phone] Fax:([phone]   Clinic Follow up Note   Patient Care Team: [name], [place] as PCP - General (Internal Medicine) Virgina Evener, Dawn, RN (Inactive) as Oncology Nurse Navigator Jon Billings, RN as [facility] Luciana Axe, Angelia Mould, [facility] (Pharmacist) Kyung Rudd, MD as Consulting Physician (Radiation Oncology)  Date of Service:  11/26/2019  CHIEF COMPLAINT:  Metastatic Small Cell Lung Cancer  SUMMARY OF ONCOLOGIC HISTORY: Oncology History Overview Note  Cancer Staging Small cell lung cancer ([place]) Staging form: Lung, AJCC 8th Edition - Clinical: Stage IVB (cTX, cN2, pM1c) - Signed by Truitt Merle, MD on 06/06/2019    Primary malignant neoplasm of bronchus of left upper lobe ([place])  07/11/2018 Imaging   CT AP 07/11/18 IMPRESSION: 1. Significantly improved appearance of the pancreas and peripancreatic tissues. Improved to resolved peripancreatic edema with resolved and significantly improved peripancreatic lesions. The remaining lesion was felt to represent abscess on prior endoscopic ultrasound sampling. Pseudocyst or infected pseudocyst would be the favored imaging diagnosis. 2. Persistent but improved mass effect upon the inferior aspect of the portal vein and superior aspect of the superior mesenteric vein. 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 3. Aortic Atherosclerosis (ICD10-I70.0). Bilateral common iliac artery ectasia. 4. A left adrenal nodule is indeterminate on precontrast imaging and may have decreased in size since the prior. Consider dedicated adrenal protocol CT. This could either be performed in 6 months to confirm size stability or more acutely, depending on clinical concern. 5. A splenic lesion is indeterminate and can be re-evaluated on follow-up. 6. Hepatic morphology suspicious for cirrhosis. 7. Cholelithiasis. 8.  Emphysema (ICD10-J43.9).     05/01/2019 Imaging   CT AP 05/01/19  IMPRESSION: 1. New diffuse liver metastases. 2. New mild retroperitoneal lymphadenopathy, consistent with metastatic disease. 3. Resolution of previously seen pancreatic mass since previous study. No radiographic evidence of acute pancreatitis. 4. Stable left adrenal mass, consistent with adrenal adenoma. 5. Stable markedly enlarged prostate and findings of chronic bladder outlet obstruction. 6. Colonic diverticulosis. No radiographic evidence of diverticulitis. 7. Cholelithiasis.   Aortic Atherosclerosis (ICD10-I70.0).   05/28/2019 PET scan   PET 05/28/19  IMPRESSION: 1. Examination is positive for widespread FDG avid liver metastases. Liver lesions are too numerous to count. 2. Hypermetabolic mediastinal and upper abdominal nodal metastases. 3. Left upper lobe and right upper lobe hypermetabolic pulmonary nodules consistent with pulmonary metastasis. 4. Innumerable liver metastases noted within the axial and proximal appendicular skeleton. Bone lesions are too numerous to count. Some of these have a corresponding lytic lesion on CT images. Others appear occult on the corresponding CT images. 5. Solid hyperdense left adrenal nodule is not significantly changed from 05/01/2019 and exhibits mild FDG uptake. Indeterminate. 6. Prostate gland enlargement. Focal area of increased uptake within the left posterior gland is indeterminate.   06/04/2019 Initial Biopsy   Biopsy 06/04/19  Diagnosis Liver, needle/core biopsy - SMALL CELL CARCINOMA. SEE COMMENT.   06/06/2019 Initial Diagnosis   Small cell lung cancer ([place])   06/06/2019 Cancer Staging   Staging form: Lung, AJCC 8th Edition - Clinical: Stage IVB (cTX, cN2, pM1c) - Signed by Truitt Merle, MD on 06/06/2019   06/10/2019 -  Chemotherapy   Carboplatin Day 1 and Etopside day 1-3 with immunotherapy atezolizumab (Tecentriq) on day 1, every 3 weeks for 6 cycles starting 06/10/19. Plan to continue maintenance  Tecentriq afterward.    06/14/2019 Imaging    MRI brain 06/14/19  IMPRESSION: No brain  metastases. Chronic small-vessel ischemic changes of the pons and cerebral hemispheric white matter.   Probable metastatic lesions within the upper cervical spine as seen. No sign of spinal canal encroachment.   07/30/2019 Imaging   CT CAP W Contrast  IMPRESSION: 1. Marked interval decrease in size of the hypermetabolic bilateral pulmonary nodules seen on previous PET-CT. 2. Clear interval decrease in the numerous hepatic metastases. 3. Interval resolution of mediastinal and upper abdominal lymphadenopathy seen previously. 4. Stable left adrenal nodule. This shows low level FDG uptake on prior PET-CT and is indeterminate. Continued attention on follow-up recommended. 5. No new or progressive findings to suggest disease progression. 6. Stable hypoenhancing splenic lesion. Attention on follow-up recommended. 7. Prostatomegaly. 8.  Aortic Atherosclerois (ICD10-170.0)     11/07/2019 - 11/21/2019 Radiation Therapy   Radiation to brain with Dr. Lisbeth Renshaw 11/07/19-11/21/19      CURRENT THERAPY:  -First lineCarboplatin Day 1 and Etopside day 1-3 with immunotherapyatezolizumab(Tecentriq) on day 1, every 3 weeks for 6 cyclesstarting 06/10/19.He is now on maintenance Tecentriq every 3 weeks. -Radiation to brain with Dr. Lisbeth Renshaw 11/07/19-11/21/19  INTERVAL HISTORY:  Tomothy Eddins is here for a follow up and treatment. He presents to the clinic alone. He notes he has been more weaker for the past 2 weeks. He feels he is not able to do much at home but can still get around and clean himself. He notes left abdominal pain 6/10. He has been taking Tramadol 3-4 times a day but needs refill. He feels this is controlling his pain. He denies cough of fever. He notes since brain radiation he has been foggy brained.     REVIEW OF SYSTEMS:   Constitutional: Denies fevers, chills or abnormal weight loss (+) fatigue  and weakness  Eyes: Denies blurriness of vision Ears, nose, mouth, throat, and face: Denies mucositis or sore throat Respiratory: Denies cough, dyspnea or wheezes Cardiovascular: Denies palpitation, chest discomfort or lower extremity swelling Gastrointestinal:  Denies nausea, heartburn or change in bowel habits (+) Left abdominal pain (6/10) Skin: Denies abnormal skin rashes Lymphatics: Denies new lymphadenopathy or easy bruising Neurological:Denies numbness, tingling or new weaknesses Behavioral/Psych: Mood is stable, no new changes  All other systems were reviewed with the patient and are negative.  MEDICAL HISTORY:  Past Medical History:  Diagnosis Date   Cancer ([place])    GERD (gastroesophageal reflux disease)    Hyperlipemia    Hypertension     SURGICAL HISTORY: Past Surgical History:  Procedure Laterality Date   arm fracture surgery Right    colon polyps removed     ESOPHAGOGASTRODUODENOSCOPY (EGD) WITH PROPOFOL N/A 06/14/2018   Procedure: ESOPHAGOGASTRODUODENOSCOPY (EGD) WITH PROPOFOL;  Surgeon: [name], MD;  Location: [facility];  Service: Endoscopy;  Laterality: N/A;   EUS N/A 06/14/2018   Procedure: ESOPHAGEAL ENDOSCOPIC ULTRASOUND (EUS) RADIAL;  Surgeon: [name], MD;  Location: [facility];  Service: Endoscopy;  Laterality: N/A;   FINE NEEDLE ASPIRATION N/A 06/14/2018   Procedure: FINE NEEDLE ASPIRATION (FNA) LINEAR;  Surgeon: [name], MD;  Location: [facility];  Service: Endoscopy;  Laterality: N/A;   IR IMAGING GUIDED PORT INSERTION  08/26/2019   TONSILLECTOMY      I have reviewed the social history and family history with the patient and they are unchanged from previous note.  ALLERGIES:  has No Known Allergies.  MEDICATIONS:  Current Outpatient Medications  Medication Sig Dispense Refill   allopurinol (ZYLOPRIM) 300 MG tablet Take 1 tablet (300 mg total) by  mouth 2 (two) times daily. 180 tablet 3   amLODipine (NORVASC)  10 MG tablet Take 1 tablet (10 mg total) by mouth daily. 90 tablet 3   apixaban (ELIQUIS) 5 MG TABS tablet Take 1 tablet (5 mg total) by mouth 2 (two) times daily. 60 tablet 3   aspirin 325 MG tablet Take 650 mg by mouth every 4 (four) hours as needed for mild pain or headache.     lidocaine-prilocaine (EMLA) cream Apply 1 application topically as needed. (Patient taking differently: Apply 1 application topically as needed (port access). ) 30 g 3   Omega-3 Fatty Acids (FISH OIL) 1000 MG CAPS Take 1,000 mg by mouth daily.      ondansetron (ZOFRAN) 8 MG tablet Take 1 tablet (8 mg total) by mouth 2 (two) times daily as needed for refractory nausea / vomiting. Start on day 3 after carboplatin chemo. 30 tablet 1   pantoprazole (PROTONIX) 20 MG tablet Take 1 tablet (20 mg total) by mouth daily. 90 tablet 3   polyvinyl alcohol (LIQUIFILM TEARS) 1.4 % ophthalmic solution Place 1 drop into both eyes as needed for dry eyes.     prochlorperazine (COMPAZINE) 10 MG tablet Take 1 tablet (10 mg total) by mouth every 6 (six) hours as needed (Nausea or vomiting). 30 tablet 1   traMADol (ULTRAM) 50 MG tablet Take 1 tablet (50 mg total) by mouth every 6 (six) hours as needed for moderate pain. 60 tablet 0   atorvastatin (LIPITOR) 40 MG tablet Take 1 tablet (40 mg total) by mouth daily at 6 PM. 90 tablet 3   nicotine (NICODERM CQ - DOSED IN MG/24 HOURS) 14 mg/24hr patch Place 1 patch (14 mg total) onto the skin daily. (Patient not taking: Reported on 11/26/2019) 28 patch 0   No current facility-administered medications for this visit.   Facility-Administered Medications Ordered in Other Visits  Medication Dose Route Frequency Provider Last Rate Last Admin   sodium chloride flush (NS) 0.9 % injection 10 mL  10 mL Intracatheter PRN Truitt Merle, MD   10 mL at 11/26/19 1752    PHYSICAL EXAMINATION: ECOG PERFORMANCE STATUS: 3 - Symptomatic, >50% confined to bed  Vitals:   11/26/19 1413  BP: 120/77  Pulse:  (!) 121  Resp: 18  Temp: 98 F (36.7 C)  SpO2: 100%   Filed Weights   11/26/19 1413  Weight: 144 lb 3.2 oz (65.4 kg)    GENERAL:alert, no distress and comfortable SKIN: skin color, texture, turgor are normal, no rashes or significant lesions EYES: normal, Conjunctiva are pink and non-injected, sclera clear  NECK: supple, thyroid normal size, non-tender, without nodularity LYMPH:  no palpable lymphadenopathy in the cervical, axillary  LUNGS: clear to auscultation and percussion with normal breathing effort HEART: regular rate & rhythm and no murmurs and no lower extremity edema ABDOMEN:abdomen soft, non-tender and normal bowel sounds (+) Mid line surgical incision healed well (+) Left abdominal tenderness Musculoskeletal:no cyanosis of digits and no clubbing  NEURO: alert & oriented x 3 with fluent speech, no focal motor/sensory deficits (+) Slower to respond  LABORATORY DATA:  I have reviewed the data as listed CBC Latest Ref Rng & Units 11/26/2019 11/04/2019 10/14/2019  WBC 4.0 - 10.5 K/uL 9.9 7.8 7.7  Hemoglobin 13.0 - 17.0 g/dL 16.4 14.4 13.9  Hematocrit 39.0 - 52.0 % 48.6 43.2 41.8  Platelets 150 - 400 K/uL 178 203 223     CMP Latest Ref Rng & Units 11/26/2019 11/04/2019 10/14/2019  Glucose 70 - 99 mg/dL 127(H) 130(H) 136(H)  BUN 8 - 23 mg/dL 45(H) 9 13  Creatinine 0.61 - 1.24 mg/dL 1.87(H) 1.15 1.24  Sodium 135 - 145 mmol/L 138 142 142  Potassium 3.5 - 5.1 mmol/L 4.5 3.5 4.0  Chloride 98 - 111 mmol/L 102 105 106  CO2 22 - 32 mmol/L 22 25 24   Calcium 8.9 - 10.3 mg/dL 10.9(H) 9.7 9.7  Total Protein 6.5 - 8.1 g/dL 9.0(H) 7.8 8.3(H)  Total Bilirubin 0.3 - 1.2 mg/dL 1.2 0.6 0.5  Alkaline Phos 38 - 126 U/L 227(H) 170(H) 155(H)  AST 15 - 41 U/L 66(H) 24 25  ALT 0 - 44 U/L 34 17 19      RADIOGRAPHIC STUDIES: I have personally reviewed the radiological images as listed and agreed with the findings in the report. No results found.   ASSESSMENT & PLAN:  Jeremy Mack is a  73 y.o. male with   1.Small Cell Lung Cancer,Right lung primary,Metastatic to liver,left lung, LNs andbone -He was diagnosed in 05/2019 with right Small cell lung cancer, TTF(+), metastatic to liver,upper abdominal nodal mets,bilateral lung, right mediastinal nodeswithdiffuse bone mets. -No brain metastasis as seen on 06/14/19 brain MRI.  -Wepreviouslydiscussed given his stage IV metastatic disease, his cancer is no longer curable but still treatable. -He has completed 4 cycles of firstlineCarboplatin etopside andimmunotherapy Tecentriq and completed brain radiation 11/07/19-11/21/19.  -He is currently on maintenance Tecentriq q3weeks. He may have chest RT soon if no disease progession.  -S/p brain radiation, he has been very weak, fatigued and slower to respond. He is still able to get around and care for himself. -Labs reviewed, he has developed AKI, will give IVF today and hold C9 Tecentriq today. He is agreeable.  -Plan for CT AP on 11/28/19. I will call with results or see him back.      2. Left abdominal pain -He notes left abdominal pain (6/10) -He has tenderness on exam today (11/26/19) -He is currently on Tramadol 3-4 times a day which he feels is controlling his pain, I refilled today (11/26/19)  3. AKI -Cr 1.87 today, with mild hypercalcemia -Likely related to dehydration -We will give normal saline 1 L today, and repeat lab and IV fluids in 2 days   4.Multifocal small stroke -This was found on his brain MRI which was obtained before PCI -He was asymptomatic -On Eliquis. He is still on baby aspirin daily.   5. H/o pancreatic abscess  -imaging in 05/2018 suspicious for neoplasm, CA 19-9 was 1 -EUS and FNA per Dr. Ardis Hughs showed inflammation, no malignancy -stable  5. SmokingCessation,H/oalcohol use  -He reports a past history of alcohol use for 10-15 years, he does not drink now.CT in 05/2018 was concerning for hepatic cirrhosis -Has 30 year smoking  history. He still smokes 1/2 ppd. I previously discussed smoking cessation and previously called in nicotine patch in 05/2019.  6. Enlarged prostate, elevated PSA  -PSA in 03/2019 9.47, up from 6.8 two years ago -enlarged prostate on CT, asymptomatic. -He was referred to urology by PCP  7.Goal of care discussion  -The patient understands the goal of care is palliative. -he is full code now  8. HTN  -On amlodipine. Lately BP range has been high (152/100 on 11/04/19)  -I recommend he see PCP about medication adjustment to better control HTN.   9. Financial Support  -He notes $80 copay for Eliquis  -He has medicare and [employer] -I recommend he f/u with financial advocate Shauna  for help applying for financial assistance.    PLAN: -I refilled Tramadol today  -Labs reviewed, due to AKI will give NS 1L today and hold Tecentriq today  -will schedule lab and IVF in 2 days  -lab, F/u and Tecentriq in 3 weeks -CT AP W contrast (if Cr normalize) on 11/28/19. Phone call or see him back after results  -I called his wife to update, was only able to leave a message     No problem-specific Assessment & Plan notes found for this encounter.   No orders of the defined types were placed in this encounter.  All questions were answered. The patient knows to call the clinic with any problems, questions or concerns. No barriers to learning was detected. I spent 20 minutes counseling the patient face to face. The total time spent in the appointment was 25 minutes and more than 50% was on counseling and review of test results     Truitt Merle, MD 11/26/2019   I, Joslyn Devon, am acting as scribe for Truitt Merle, MD.   I have reviewed the above documentation for accuracy and completeness, and I agree with the above.

## 2019-11-26 NOTE — Patient Instructions (Signed)

## 2019-11-26 NOTE — Progress Notes (Signed)
OK to treat with HR 121

## 2019-11-26 NOTE — Telephone Encounter (Signed)
Pt's wife called and stated pt was weak and unable to make today's appts.   Scheduling message sent high priority to reschedule appts.

## 2019-11-27 ENCOUNTER — Ambulatory Visit: Payer: Medicare PPO

## 2019-11-27 ENCOUNTER — Other Ambulatory Visit: Payer: Self-pay

## 2019-11-27 ENCOUNTER — Telehealth: Payer: Self-pay | Admitting: Hematology

## 2019-11-27 NOTE — Progress Notes (Signed)
Nutrition Assessment:  Patient identified on Malnutrition Screening report for weight loss.  73 year old male with metastatic small cell lung cancer.  Past medical history of GERD, HLD HTN, stroke, pancreatic abscess.  Patient receiving tecentriq.  Completed radiation to brain on 12/24.    Spoke with patient via phone.  Patient reports that his appetite is poor.  Reports that he has eaten some bananas and peaches today and ate a chicken sandwich.  Drinks boost original 1 time per day.  Denies nausea or constipation or diarrhea.      Medications: omega 3 fatty acids, protonix, compazine  Labs: reviewed  Anthropometrics:   Height: 71 inches Weight: 144 lb 3.2 oz 12/29 Noted 165 lb on 12/7 BMI: 20  13% weight loss in the last 3 weeks, significant   NUTRITION DIAGNOSIS: Inadequate oral intake related to cancer related treatment side effects as evidenced by 13% ewight loss in the last 3 weeks.   INTERVENTION:  Discussed ways to increase calories and protein.  Will mail handout Encouraged patient to try boost plus BID for higher calories and protein. Will mail coupons. Contact information mailed    MONITORING, EVALUATION, GOAL: Patient will consume adequate calories and protein to prevent weight loss   NEXT VISIT: to be determined  Zerline Melchior B. Zenia Resides, [place], [place] Registered Dietitian [phone] (pager)

## 2019-11-27 NOTE — Telephone Encounter (Signed)
Scheduled appt per 12/29 los.  Spoke with pt and he is aware of the appt date and time.

## 2019-11-28 ENCOUNTER — Telehealth: Payer: Self-pay

## 2019-11-28 ENCOUNTER — Other Ambulatory Visit: Payer: Self-pay

## 2019-11-28 ENCOUNTER — Inpatient Hospital Stay: Payer: Medicare PPO

## 2019-11-28 ENCOUNTER — Inpatient Hospital Stay (HOSPITAL_COMMUNITY)
Admission: RE | Admit: 2019-11-28 | Discharge: 2019-11-28 | Disposition: A | Discharge status: Home / Self Care | Payer: Medicare PPO | Source: Ambulatory Visit | Attending: Hematology | Admitting: Hematology

## 2019-11-28 ENCOUNTER — Inpatient Hospital Stay (HOSPITAL_COMMUNITY)
Admission: AD | Admit: 2019-11-28 | Discharge: 2019-11-29 | DRG: 682 | Disposition: A | Discharge status: Home / Self Care | Payer: Medicare PPO | Source: Ambulatory Visit | Attending: Hematology | Admitting: Hematology

## 2019-11-28 ENCOUNTER — Other Ambulatory Visit: Payer: Self-pay | Admitting: Hematology

## 2019-11-28 DIAGNOSIS — C3412 Malignant neoplasm of upper lobe, left bronchus or lung: Secondary | ICD-10-CM | POA: Diagnosis not present

## 2019-11-28 DIAGNOSIS — Z833 Family history of diabetes mellitus: Secondary | ICD-10-CM | POA: Diagnosis not present

## 2019-11-28 DIAGNOSIS — G9341 Metabolic encephalopathy: Secondary | ICD-10-CM | POA: Diagnosis present

## 2019-11-28 DIAGNOSIS — C787 Secondary malignant neoplasm of liver and intrahepatic bile duct: Secondary | ICD-10-CM | POA: Diagnosis present

## 2019-11-28 DIAGNOSIS — C7800 Secondary malignant neoplasm of unspecified lung: Secondary | ICD-10-CM | POA: Diagnosis present

## 2019-11-28 DIAGNOSIS — N179 Acute kidney failure, unspecified: Secondary | ICD-10-CM | POA: Diagnosis present

## 2019-11-28 DIAGNOSIS — I639 Cerebral infarction, unspecified: Secondary | ICD-10-CM

## 2019-11-28 DIAGNOSIS — C3490 Malignant neoplasm of unspecified part of unspecified bronchus or lung: Secondary | ICD-10-CM

## 2019-11-28 DIAGNOSIS — C3492 Malignant neoplasm of unspecified part of left bronchus or lung: Secondary | ICD-10-CM | POA: Diagnosis present

## 2019-11-28 DIAGNOSIS — Z8673 Personal history of transient ischemic attack (TIA), and cerebral infarction without residual deficits: Secondary | ICD-10-CM | POA: Diagnosis not present

## 2019-11-28 DIAGNOSIS — B370 Candidal stomatitis: Secondary | ICD-10-CM | POA: Diagnosis present

## 2019-11-28 DIAGNOSIS — Z9221 Personal history of antineoplastic chemotherapy: Secondary | ICD-10-CM | POA: Diagnosis not present

## 2019-11-28 DIAGNOSIS — E883 Tumor lysis syndrome: Principal | ICD-10-CM | POA: Diagnosis present

## 2019-11-28 DIAGNOSIS — I10 Essential (primary) hypertension: Secondary | ICD-10-CM | POA: Diagnosis present

## 2019-11-28 DIAGNOSIS — F17290 Nicotine dependence, other tobacco product, uncomplicated: Secondary | ICD-10-CM | POA: Diagnosis present

## 2019-11-28 DIAGNOSIS — C3491 Malignant neoplasm of unspecified part of right bronchus or lung: Secondary | ICD-10-CM | POA: Diagnosis present

## 2019-11-28 DIAGNOSIS — Z7901 Long term (current) use of anticoagulants: Secondary | ICD-10-CM | POA: Diagnosis not present

## 2019-11-28 DIAGNOSIS — E790 Hyperuricemia without signs of inflammatory arthritis and tophaceous disease: Secondary | ICD-10-CM | POA: Diagnosis present

## 2019-11-28 DIAGNOSIS — C7951 Secondary malignant neoplasm of bone: Secondary | ICD-10-CM | POA: Diagnosis present

## 2019-11-28 DIAGNOSIS — Z20822 Contact with and (suspected) exposure to covid-19: Secondary | ICD-10-CM | POA: Diagnosis present

## 2019-11-28 LAB — CBC WITH DIFFERENTIAL (CANCER CENTER ONLY)
Abs Immature Granulocytes: 0.13 10*3/uL — ABNORMAL HIGH (ref 0.00–0.07)
Basophils Absolute: 0 10*3/uL (ref 0.0–0.1)
Basophils Relative: 0 %
Eosinophils Absolute: 0 10*3/uL (ref 0.0–0.5)
Eosinophils Relative: 0 %
HCT: 42.8 % (ref 39.0–52.0)
Hemoglobin: 14.7 g/dL (ref 13.0–17.0)
Immature Granulocytes: 1 %
Lymphocytes Relative: 14 %
Lymphs Abs: 1.3 10*3/uL (ref 0.7–4.0)
MCH: 30.6 pg (ref 26.0–34.0)
MCHC: 34.3 g/dL (ref 30.0–36.0)
MCV: 89 fL (ref 80.0–100.0)
Monocytes Absolute: 0.8 10*3/uL (ref 0.1–1.0)
Monocytes Relative: 9 %
Neutro Abs: 7 10*3/uL (ref 1.7–7.7)
Neutrophils Relative %: 76 %
Platelet Count: 131 10*3/uL — ABNORMAL LOW (ref 150–400)
RBC: 4.81 MIL/uL (ref 4.22–5.81)
RDW: 14.6 % (ref 11.5–15.5)
WBC Count: 9.3 10*3/uL (ref 4.0–10.5)
nRBC: 0.2 % (ref 0.0–0.2)

## 2019-11-28 LAB — CMP (CANCER CENTER ONLY)
ALT: 45 U/L — ABNORMAL HIGH (ref 0–44)
AST: 91 U/L — ABNORMAL HIGH (ref 15–41)
Albumin: 3.5 g/dL (ref 3.5–5.0)
Alkaline Phosphatase: 262 U/L — ABNORMAL HIGH (ref 38–126)
Anion gap: 15 (ref 5–15)
BUN: 31 mg/dL — ABNORMAL HIGH (ref 8–23)
CO2: 20 mmol/L — ABNORMAL LOW (ref 22–32)
Calcium: 10.9 mg/dL — ABNORMAL HIGH (ref 8.9–10.3)
Chloride: 103 mmol/L (ref 98–111)
Creatinine: 1.78 mg/dL — ABNORMAL HIGH (ref 0.61–1.24)
GFR, Est AFR Am: 44 mL/min — ABNORMAL LOW (ref >60)
GFR, Estimated: 38 mL/min — ABNORMAL LOW (ref >60)
Glucose, Bld: 140 mg/dL — ABNORMAL HIGH (ref 70–99)
Potassium: 4.5 mmol/L (ref 3.5–5.1)
Sodium: 138 mmol/L (ref 135–145)
Total Bilirubin: 0.5 mg/dL (ref 0.3–1.2)
Total Protein: 8.2 g/dL — ABNORMAL HIGH (ref 6.5–8.1)

## 2019-11-28 LAB — CBC
HCT: 38.1 % — ABNORMAL LOW (ref 39.0–52.0)
Hemoglobin: 12.7 g/dL — ABNORMAL LOW (ref 13.0–17.0)
MCH: 30.8 pg (ref 26.0–34.0)
MCHC: 33.3 g/dL (ref 30.0–36.0)
MCV: 92.3 fL (ref 80.0–100.0)
Platelets: 108 10*3/uL — ABNORMAL LOW (ref 150–400)
RBC: 4.13 MIL/uL — ABNORMAL LOW (ref 4.22–5.81)
RDW: 14.6 % (ref 11.5–15.5)
WBC: 10.2 10*3/uL (ref 4.0–10.5)
nRBC: 0 % (ref 0.0–0.2)

## 2019-11-28 LAB — PHOSPHORUS: Phosphorus: 3.9 mg/dL (ref 2.5–4.6)

## 2019-11-28 LAB — MAGNESIUM: Magnesium: 2 mg/dL (ref 1.7–2.4)

## 2019-11-28 LAB — URIC ACID: Uric Acid, Serum: 10.4 mg/dL — ABNORMAL HIGH (ref 3.7–8.6)

## 2019-11-28 MED ORDER — SODIUM CHLORIDE 0.9 % IV SOLN: INTRAVENOUS | Status: DC

## 2019-11-28 MED ORDER — HYDROCORTISONE (PERIANAL) 2.5 % EX CREA: 1.0000 "application " | TOPICAL_CREAM | Freq: Two times a day (BID) | CUTANEOUS | Status: DC | PRN

## 2019-11-28 MED ORDER — SENNOSIDES-DOCUSATE SODIUM 8.6-50 MG PO TABS: 1.0000 | ORAL_TABLET | Freq: Every evening | ORAL | Status: DC | PRN

## 2019-11-28 MED ORDER — SODIUM CHLORIDE 0.9 % IV SOLN
6.0000 mg | Freq: Once | INTRAVENOUS | Status: AC
  Administered 2019-11-28: 19:00:00 6 mg via INTRAVENOUS
  Filled 2019-11-28: qty 4

## 2019-11-28 MED ORDER — ALUM & MAG HYDROXIDE-SIMETH 200-200-20 MG/5ML PO SUSP: 60.0000 mL | ORAL | Status: DC | PRN

## 2019-11-28 MED ORDER — AMLODIPINE BESYLATE 10 MG PO TABS
10.0000 mg | ORAL_TABLET | Freq: Every day | ORAL | Status: DC
  Administered 2019-11-29: 10 mg via ORAL
  Filled 2019-11-28: qty 1

## 2019-11-28 MED ORDER — ACETAMINOPHEN 325 MG PO TABS: 650.0000 mg | ORAL_TABLET | ORAL | Status: DC | PRN

## 2019-11-28 MED ORDER — ONDANSETRON 4 MG PO TBDP: 4.0000 mg | ORAL_TABLET | Freq: Three times a day (TID) | ORAL | Status: DC | PRN

## 2019-11-28 MED ORDER — ATORVASTATIN CALCIUM 40 MG PO TABS
40.0000 mg | ORAL_TABLET | Freq: Every day | ORAL | Status: DC
  Administered 2019-11-28: 40 mg via ORAL
  Filled 2019-11-28: qty 1

## 2019-11-28 MED ORDER — HEPARIN SODIUM (PORCINE) 5000 UNIT/ML IJ SOLN: 5000.0000 [IU] | Freq: Two times a day (BID) | INTRAMUSCULAR | Status: DC

## 2019-11-28 MED ORDER — SODIUM CHLORIDE 0.9 % IV SOLN
8.0000 mg | Freq: Three times a day (TID) | INTRAVENOUS | Status: DC | PRN
  Filled 2019-11-28: qty 4

## 2019-11-28 MED ORDER — ONDANSETRON HCL 4 MG/2ML IJ SOLN: 4.0000 mg | Freq: Three times a day (TID) | INTRAMUSCULAR | Status: DC | PRN

## 2019-11-28 MED ORDER — PANTOPRAZOLE SODIUM 20 MG PO TBEC
20.0000 mg | DELAYED_RELEASE_TABLET | Freq: Every day | ORAL | Status: DC
  Administered 2019-11-29: 11:00:00 20 mg via ORAL
  Filled 2019-11-28: qty 1

## 2019-11-28 MED ORDER — ALLOPURINOL 300 MG PO TABS
300.0000 mg | ORAL_TABLET | Freq: Every day | ORAL | Status: DC
  Administered 2019-11-28: 300 mg via ORAL
  Filled 2019-11-28: qty 1

## 2019-11-28 MED ORDER — GUAIFENESIN-DM 100-10 MG/5ML PO SYRP: 10.0000 mL | ORAL_SOLUTION | ORAL | Status: DC | PRN

## 2019-11-28 MED ORDER — POLYVINYL ALCOHOL 1.4 % OP SOLN
1.0000 [drp] | OPHTHALMIC | Status: DC | PRN
  Filled 2019-11-28: qty 15

## 2019-11-28 MED ORDER — SODIUM CHLORIDE 0.9 % IV SOLN
INTRAVENOUS | Status: DC
  Filled 2019-11-28 (×2): qty 250

## 2019-11-28 MED ORDER — PROCHLORPERAZINE MALEATE 10 MG PO TABS: 10.0000 mg | ORAL_TABLET | Freq: Four times a day (QID) | ORAL | Status: DC | PRN

## 2019-11-28 MED ORDER — OXYCODONE HCL 5 MG PO TABS: 5.0000 mg | ORAL_TABLET | ORAL | Status: DC | PRN

## 2019-11-28 MED ORDER — APIXABAN 2.5 MG PO TABS
2.5000 mg | ORAL_TABLET | Freq: Two times a day (BID) | ORAL | Status: DC
  Administered 2019-11-28 – 2019-11-29 (×2): 2.5 mg via ORAL
  Filled 2019-11-28 (×2): qty 1

## 2019-11-28 MED ORDER — ONDANSETRON HCL 4 MG PO TABS: 4.0000 mg | ORAL_TABLET | Freq: Three times a day (TID) | ORAL | Status: DC | PRN

## 2019-11-28 MED ORDER — SODIUM CHLORIDE 0.9 % IV SOLN: 6.0000 mg | Freq: Once | INTRAVENOUS | Status: DC

## 2019-11-28 NOTE — Telephone Encounter (Signed)
Left voice message for patient/wife [name] that he was supposed to be here at 9:45 for lab work to recheck his kidney function prior to his CT scan at 10:30.  Stressed the importance of him coming on in to have this done.  Attempted to call her cell phone no answer and no voice mail set up.

## 2019-11-28 NOTE — Progress Notes (Addendum)
[place]leen  [phone]   HEMATOLOGY ONCOLOGY INPATIENT ADMISSION HISTORY AND PHYSICAL   Sheila Gervasi  DOB: 30-Nov-1945  MR#: [id number]  CSN#: [id number]    Reason for admission: Tumor Lysis Syndrome   History of present illness:   This patient is well known to me and under my care for his metastatic small cell lung cancer which is currently being treated with maintenance Tecentriq.   Patient completed prophylactic whole brain radiation last week.  He was seen in my office 2 days ago for routine follow-up and maintenance Tecentriq.  He was found to be very drowsy, fatigued, and the labs showed AKI with mild hypercalcemia.  I held Tecentriq and give him IV fluids.  Repeated labs this morning showed persistent AKI, and uric acid was significantly elevated.  Restaging CT scan today showed significant cancer progression.  I will admit him to [facility] for management of his tumor lysis syndrome.  He has been lethargic, fell asleep intermittently, and slightly disorientated.  He had stool incontinence after his CT scan, and was cleaned in our infusion room.  I talked to his wife, which reported he has not been eating and drinking well lately, no fever, cough, shortness of breath, or significant abdominal pain.   MEDICAL HISTORY:  Past Medical History:  Diagnosis Date  . Cancer ([place])   . GERD (gastroesophageal reflux disease)   . Hyperlipemia   . Hypertension     SURGICAL HISTORY: Past Surgical History:  Procedure Laterality Date  . arm fracture surgery Right   . colon polyps removed    . ESOPHAGOGASTRODUODENOSCOPY (EGD) WITH PROPOFOL N/A 06/14/2018   Procedure: ESOPHAGOGASTRODUODENOSCOPY (EGD) WITH PROPOFOL;  Surgeon: [name], MD;  Location: [facility];  Service: Endoscopy;  Laterality: N/A;  . EUS N/A 06/14/2018   Procedure: ESOPHAGEAL ENDOSCOPIC ULTRASOUND (EUS) RADIAL;  Surgeon: [name], MD;  Location: [facility];  Service:  Endoscopy;  Laterality: N/A;  . FINE NEEDLE ASPIRATION N/A 06/14/2018   Procedure: FINE NEEDLE ASPIRATION (FNA) LINEAR;  Surgeon: [name], MD;  Location: [facility];  Service: Endoscopy;  Laterality: N/A;  . IR IMAGING GUIDED PORT INSERTION  08/26/2019  . TONSILLECTOMY      SOCIAL HISTORY: Social History   Socioeconomic History  . Marital status: Married    Spouse name: Not on file  . Number of children: 4  . Years of education: 53  . Highest education level: Not on file  Occupational History  . Occupation: Retired  Tobacco Use  . Smoking status: Current Every Day Smoker    Packs/day: 0.50    Types: Cigars  . Smokeless tobacco: Never Used  . Tobacco comment: Trying to quit  Substance and Sexual Activity  . Alcohol use: Yes    Alcohol/week: 0.0 standard drinks    Comment: Occasionally  . Drug use: No  . Sexual activity: Not on file  Other Topics Concern  . Not on file  Social History Narrative   Fun: Swimming, and outdoor sports   Denies religious beliefs effecting health care.    Social Determinants of Health   Financial Resource Strain:   . Difficulty of Paying Living Expenses: Not on file  Food Insecurity: No Food Insecurity  . Worried About Charity fundraiser in the Last Year: Never true  . Ran Out of Food in the Last Year: Never true  Transportation Needs: No Transportation Needs  . Lack of Transportation (Medical): No  . Lack of  Transportation (Non-Medical): No  Physical Activity:   . Days of Exercise per Week: Not on file  . Minutes of Exercise per Session: Not on file  Stress:   . Feeling of Stress : Not on file  Social Connections:   . Frequency of Communication with Friends and Family: Not on file  . Frequency of Social Gatherings with Friends and Family: Not on file  . Attends Religious Services: Not on file  . Active Member of Clubs or Organizations: Not on file  . Attends Archivist Meetings: Not on file  . Marital Status: Not  on file  Intimate Partner Violence:   . Fear of Current or Ex-Partner: Not on file  . Emotionally Abused: Not on file  . Physically Abused: Not on file  . Sexually Abused: Not on file    FAMILY HISTORY: Family History  Problem Relation Age of Onset  . Diabetes Mother   . Diabetes Maternal Grandmother     ALLERGIES:  has No Known Allergies.  MEDICATIONS:  Current Facility-Administered Medications  Medication Dose Route Frequency Provider Last Rate Last Admin  . 0.9 %  sodium chloride infusion   Intravenous Continuous Truitt Merle, MD      . acetaminophen (TYLENOL) tablet 650 mg  650 mg Oral Q4H PRN Truitt Merle, MD      . allopurinol (ZYLOPRIM) tablet 300 mg  300 mg Oral BID Truitt Merle, MD      . alum & mag hydroxide-simeth (MAALOX/MYLANTA) 200-200-20 MG/5ML suspension 60 mL  60 mL Oral Q4H PRN Truitt Merle, MD      . Derrill Memo ON 11/29/2019] amLODipine (NORVASC) tablet 10 mg  10 mg Oral Daily Truitt Merle, MD      . apixaban Arne [place]) tablet 2.5 mg  2.5 mg Oral BID Truitt Merle, MD      . atorvastatin (LIPITOR) tablet 40 mg  40 mg Oral q1800 Truitt Merle, MD      . guaiFENesin-dextromethorphan (ROBITUSSIN DM) 100-10 MG/5ML syrup 10 mL  10 mL Oral Q4H PRN Truitt Merle, MD      . heparin injection 5,000 Units  5,000 Units Subcutaneous Q12H Truitt Merle, MD      . hydrocortisone (ANUSOL-HC) 2.5 % rectal cream 1 application  1 application Rectal BID PRN Truitt Merle, MD      . ondansetron [facility]) tablet 4-8 mg  4-8 mg Oral Q8H PRN Truitt Merle, MD       Or  . ondansetron (ZOFRAN-ODT) disintegrating tablet 4-8 mg  4-8 mg Oral Q8H PRN Truitt Merle, MD       Or  . ondansetron [facility]) injection 4 mg  4 mg Intravenous Q8H PRN Truitt Merle, MD       Or  . ondansetron (ZOFRAN) 8 mg in sodium chloride 0.9 % 50 mL IVPB  8 mg Intravenous Q8H PRN Truitt Merle, MD      . oxyCODONE (Oxy IR/ROXICODONE) immediate release tablet 5 mg  5 mg Oral Q4H PRN Truitt Merle, MD      . pantoprazole (PROTONIX) EC tablet 20 mg  20 mg Oral Daily Truitt Merle,  MD      . polyvinyl alcohol (LIQUIFILM TEARS) 1.4 % ophthalmic solution 1 drop  1 drop Both Eyes PRN Truitt Merle, MD      . prochlorperazine (COMPAZINE) tablet 10 mg  10 mg Oral Q6H PRN Truitt Merle, MD      . rasburicase (ELITEK) 6 mg in sodium chloride 0.9 % 46 mL IVPB  6 mg  Intravenous Once Minda Ditto, RPH      . senna-docusate (Senokot-S) tablet 1 tablet  1 tablet Oral QHS PRN Truitt Merle, MD        REVIEW OF SYSTEMS:   Constitutional: Denies fevers, chills or abnormal night sweats (+) Fatigue, lethargic  Eyes: Denies blurriness of vision, double vision or watery eyes Ears, nose, mouth, throat, and face: Denies mucositis or sore throat Respiratory: Denies cough, dyspnea or wheezes Cardiovascular: Denies palpitation, chest discomfort or lower extremity swelling Gastrointestinal:  Denies nausea, heartburn (+) Stool incontinence  Skin: Denies abnormal skin rashes Lymphatics: Denies new lymphadenopathy or easy bruising Neurological:Denies numbness, tingling or new weaknesses Behavioral/Psych: Mood is stable, no new changes  All other systems were reviewed with the patient and are negative.  PHYSICAL EXAMINATION: ECOG PERFORMANCE STATUS: 3 - Symptomatic, >50% confined to bed  Vitals:   11/28/19 1600  BP: 132/85  Pulse: 91  Resp: 14  Temp: (!) 97.4 F (36.3 C)  SpO2: 99%   Filed Weights   11/28/19 1600  Weight: 145 lb 8.4 oz (66 kg)    GENERAL: Drowsy, slightly disoriented SKIN: skin color, texture, turgor are normal, no rashes or significant lesions EYES: normal, conjunctiva are pink and non-injected, sclera clear OROPHARYNX:no exudate, no erythema and lips, buccal mucosa, and tongue normal  NECK: supple, thyroid normal size, non-tender, without nodularity LYMPH:  no palpable lymphadenopathy in the cervical, axillary or inguinal LUNGS: clear to auscultation and percussion with normal breathing effort HEART: regular rate & rhythm and no murmurs and no lower extremity  edema ABDOMEN:abdomen soft, non-tender and normal bowel sounds Musculoskeletal:no cyanosis of digits and no clubbing  PSYCH: alert & oriented x 3 with fluent speech NEURO: no focal motor/sensory deficits  LABORATORY DATA:  I have reviewed the data as listed Lab Results  Component Value Date   WBC 9.3 11/28/2019   HGB 14.7 11/28/2019   HCT 42.8 11/28/2019   MCV 89.0 11/28/2019   PLT 131 (L) 11/28/2019   Recent Labs    11/04/19 0910 11/26/19 1349 11/28/19 1028  NA 142 138 138  K 3.5 4.5 4.5  CL 105 102 103  CO2 25 22 20*  GLUCOSE 130* 127* 140*  BUN 9 45* 31*  CREATININE 1.15 1.87* 1.78*  CALCIUM 9.7 10.9* 10.9*  GFRNONAA >60 35* 38*  GFRAA >60 41* 44*  PROT 7.8 9.0* 8.2*  ALBUMIN 3.6 4.0 3.5  AST 24 66* 91*  ALT 17 34 45*  ALKPHOS 170* 227* 262*  BILITOT 0.6 1.2 0.5   Uric acid 10.4 today   RADIOGRAPHIC STUDIES: I have personally reviewed the radiological images as listed and agreed with the findings in the report. CT Abdomen Pelvis Wo Contrast  Result Date: 11/28/2019 CLINICAL DATA:  Restaging small cell lung cancer. EXAM: CT CHEST, ABDOMEN AND PELVIS WITHOUT CONTRAST TECHNIQUE: Multidetector CT imaging of the chest, abdomen and pelvis was performed following the standard protocol without IV contrast. COMPARISON:  07/30/2019 FINDINGS: CT CHEST FINDINGS Cardiovascular: Normal heart size. No pericardial effusion. Aortic atherosclerosis lad and left circumflex coronary artery calcifications. Mediastinum/Nodes: Normal appearance of the thyroid gland. The trachea appears patent and is midline. Normal appearance of the esophagus. Right paratracheal lymph node measures 2.3 cm. Previously 0.7 cm. Lower right paratracheal lymph node measures 1.8 cm, image 24/2. Previously 0.7 cm no enlarged supraclavicular or axillary lymph nodes. Lungs/Pleura: No pleural effusion. No airspace consolidation, atelectasis or pneumothorax. Right upper lobe pulmonary nodules are again noted in are  increased from  previous exam. Peripheral spiculated right upper lobe lung nodule measures 1 cm, image 60/4. Central right upper lobe perihilar nodule measures 1.1 cm, image 63/4. New from previous exam. Adjacent nodule measures 2.0 cm, image 65/4. Also new from previous exam. Musculoskeletal: Multifocal areas of mixed lytic and sclerotic bone metastases are noted. The appearance is similar to previous exam. CT ABDOMEN PELVIS FINDINGS Hepatobiliary: Multifocal liver metastases have progressed from previous exam. Lesions are increased in size and number in the interval. For example, within lateral segment of left lobe of liver there is a 4.9 cm hypodense metastasis, image 54/2. Previously 2.1 cm. Dome of liver lesion measures 5.2 cm, image 46/2. Previously 1.8 cm segment 5 liver lesion measures 1.6 cm, image 63/2. Previously 0.8 cm. Lesion within segment 7 measures 2.2 cm, image 53/2. Previously 1.3 cm. Stones noted layering within the dependent portion of the gallbladder. No biliary ductal dilatation. Pancreas: Unremarkable. No pancreatic ductal dilatation or surrounding inflammatory changes. Spleen: Normal in size without focal abnormality. Adrenals/Urinary Tract: Normal right adrenal gland. There is a solid nodule in the left adrenal gland measuring 2 cm, image 57/2. Previously this measured the same. No kidney mass or hydronephrosis identified. Urinary bladder unremarkable. Stomach/Bowel: Stomach is within normal limits. Appendix not confidently identified. Postop change involving the sigmoid colon is again noted, stable. No evidence of bowel wall thickening, distention, or inflammatory changes. Vascular/Lymphatic: Aortic atherosclerosis. No abdominopelvic adenopathy. Reproductive: Enlargement of the prostate gland is again noted. Other: No free fluid or fluid collections. Musculoskeletal: Heterogeneous bone mineralization within the bony pelvis and lumbar spine is similar to previous exam compatible with osseous  metastasis. IMPRESSION: 1. Interval progression of liver metastases. 2. Increase in size and number of right upper lobe pulmonary nodules. New right paratracheal adenopathy 3. Stable appearance of multifocal mixed lytic and sclerotic bone metastases. 4. Aortic atherosclerosis. Coronary artery calcifications noted. Aortic Atherosclerosis (ICD10-I70.0). Electronically Signed   By: Kerby Moors M.D.   On: 11/28/2019 14:24   CT Chest Wo Contrast  Result Date: 11/28/2019 CLINICAL DATA:  Restaging small cell lung cancer. EXAM: CT CHEST, ABDOMEN AND PELVIS WITHOUT CONTRAST TECHNIQUE: Multidetector CT imaging of the chest, abdomen and pelvis was performed following the standard protocol without IV contrast. COMPARISON:  07/30/2019 FINDINGS: CT CHEST FINDINGS Cardiovascular: Normal heart size. No pericardial effusion. Aortic atherosclerosis lad and left circumflex coronary artery calcifications. Mediastinum/Nodes: Normal appearance of the thyroid gland. The trachea appears patent and is midline. Normal appearance of the esophagus. Right paratracheal lymph node measures 2.3 cm. Previously 0.7 cm. Lower right paratracheal lymph node measures 1.8 cm, image 24/2. Previously 0.7 cm no enlarged supraclavicular or axillary lymph nodes. Lungs/Pleura: No pleural effusion. No airspace consolidation, atelectasis or pneumothorax. Right upper lobe pulmonary nodules are again noted in are increased from previous exam. Peripheral spiculated right upper lobe lung nodule measures 1 cm, image 60/4. Central right upper lobe perihilar nodule measures 1.1 cm, image 63/4. New from previous exam. Adjacent nodule measures 2.0 cm, image 65/4. Also new from previous exam. Musculoskeletal: Multifocal areas of mixed lytic and sclerotic bone metastases are noted. The appearance is similar to previous exam. CT ABDOMEN PELVIS FINDINGS Hepatobiliary: Multifocal liver metastases have progressed from previous exam. Lesions are increased in size and  number in the interval. For example, within lateral segment of left lobe of liver there is a 4.9 cm hypodense metastasis, image 54/2. Previously 2.1 cm. Dome of liver lesion measures 5.2 cm, image 46/2. Previously 1.8 cm segment 5 liver lesion measures  1.6 cm, image 63/2. Previously 0.8 cm. Lesion within segment 7 measures 2.2 cm, image 53/2. Previously 1.3 cm. Stones noted layering within the dependent portion of the gallbladder. No biliary ductal dilatation. Pancreas: Unremarkable. No pancreatic ductal dilatation or surrounding inflammatory changes. Spleen: Normal in size without focal abnormality. Adrenals/Urinary Tract: Normal right adrenal gland. There is a solid nodule in the left adrenal gland measuring 2 cm, image 57/2. Previously this measured the same. No kidney mass or hydronephrosis identified. Urinary bladder unremarkable. Stomach/Bowel: Stomach is within normal limits. Appendix not confidently identified. Postop change involving the sigmoid colon is again noted, stable. No evidence of bowel wall thickening, distention, or inflammatory changes. Vascular/Lymphatic: Aortic atherosclerosis. No abdominopelvic adenopathy. Reproductive: Enlargement of the prostate gland is again noted. Other: No free fluid or fluid collections. Musculoskeletal: Heterogeneous bone mineralization within the bony pelvis and lumbar spine is similar to previous exam compatible with osseous metastasis. IMPRESSION: 1. Interval progression of liver metastases. 2. Increase in size and number of right upper lobe pulmonary nodules. New right paratracheal adenopathy 3. Stable appearance of multifocal mixed lytic and sclerotic bone metastases. 4. Aortic atherosclerosis. Coronary artery calcifications noted. Aortic Atherosclerosis (ICD10-I70.0). Electronically Signed   By: Kerby Moors M.D.   On: 11/28/2019 14:24    ASSESSMENT & PLAN:   This patient is well known to me and under my care for his metastatic small cell lung cancer  which is currently being treated with maintenance Tecentriq. Treatment was held since 11/26/19 due to AKI.   1. Tumor Lysis Syndrome from his cancer progression  2. AKI secondary to #1 3.  Hypercalcemia, and hyperurecemia 4.  Metabolic encephalopathy 5. Small Cell Lung Cancer,Right lung primary,Metastatic to liver,left lung, LNs andbone 6. Transamanitis 7. Oral Thrush  8. History of CVA, on Eliquis 9. HTN 10. DVT prophylaxis: He is on Eliquis, due to his AKI, I will reduce dose to 2.5 mg twice daily 11. Code status: full   -Cr 1.87 (11/26/19) 1.78 today (10/31/19), uric acid level 10.4, indicating TLS -Scheduled CT CAP from today shows interval progression of liver and lungs mets with stable bone mets. I suspect his disease progression has lead to TLS. I reviewed with pt and his wife  -His COVID19 test results are still pending   Plan  -For his high uric acid I will order 1 dose of risperidone 6mg  and give IV Fluids NS 156ml/hr for now  -wil repeat TLS lab in the morning  -For his oral thrush I will order nystatin mouthwash  -I will f/u with him tomorrow morning and discuss his current condition with his Wife. Will address code status with pt and his wife tomorrow.  -I will round tomorrow morning   I discussed the above treatment plan with patient, his wife, and bedside nurse.       Truitt Merle, MD 11/28/2019 4:34 PM  Cell: [phone]

## 2019-11-28 NOTE — Patient Outreach (Signed)
[facility]) Care Management  11/28/2019  Jeremy Mack [birth year] [id number]   Telephone call to patient. Spoke with patient but he states is ok but really not engaging with CM.  Discussed importance of diet and nutrition with patient.  He verbalized understanding.    He gave permission to speak with spouse.  She states that patient is going down so fast and that he is not eating or drinking right now.  She also states he is very weak.  She has been taking patient to his appointments and has not been allowed to go in due to [place] restrictions.  She is normally in the car waiting for patient during his appointments.  She is really not sure on patient overall condition and been able to speak with the physician due to not being able to be a part of the office visits.    Patient due for appointments today and wife will be transporting.  CM sent a message to [name] and his nurse and also dietitian Jennet Maduro.    Plan: RN CM will outreach again within 1 week and wife agreeable.    [name], RN, MSN [place] [facility] Care Management Coordinator Direct Line [phone] Cell [phone] Toll Free: [phone]  Fax: [phone]

## 2019-11-28 NOTE — Telephone Encounter (Signed)
Spoke with bed placement regarding direct admission of this patient by [name] for tumor lysis syndrome.  Patient will be admitted to [facility] and will be able to go directly to admitting.    1510 - Report called to [place] on [address].

## 2019-11-28 NOTE — Telephone Encounter (Signed)
Patient had left CT waiting room.  I called spoke to his wife [name] per [name] she wanted him to get some IVF today.  She states he doesn't have a cell phone, his car is in the driveway but she doesn't know where he is at.   I asked her to try to contact him and let me know if he can come back for IVF today as he kidney function is not good, he appears dehydrated.  She states she will call back.

## 2019-11-29 ENCOUNTER — Other Ambulatory Visit: Payer: Self-pay | Admitting: Hematology

## 2019-11-29 LAB — BASIC METABOLIC PANEL
Anion gap: 10 (ref 5–15)
BUN: 20 mg/dL (ref 8–23)
CO2: 21 mmol/L — ABNORMAL LOW (ref 22–32)
Calcium: 9.8 mg/dL (ref 8.9–10.3)
Chloride: 106 mmol/L (ref 98–111)
Creatinine, Ser: 1.11 mg/dL (ref 0.61–1.24)
GFR calc Af Amer: >60 mL/min (ref >60)
GFR calc non Af Amer: >60 mL/min (ref >60)
Glucose, Bld: 100 mg/dL — ABNORMAL HIGH (ref 70–99)
Potassium: 3.9 mmol/L (ref 3.5–5.1)
Sodium: 137 mmol/L (ref 135–145)

## 2019-11-29 LAB — RASBURICASE - URIC ACID: Uric Acid, Serum: <0.5 mg/dL — ABNORMAL LOW (ref 3.7–8.6)

## 2019-11-29 LAB — PROTIME-INR
INR: 1.3 — ABNORMAL HIGH (ref 0.8–1.2)
Prothrombin Time: 15.8 seconds — ABNORMAL HIGH (ref 11.4–15.2)

## 2019-11-29 LAB — SARS CORONAVIRUS 2 (TAT 6-24 HRS): SARS Coronavirus 2: NEGATIVE

## 2019-11-29 LAB — APTT: aPTT: 41 seconds — ABNORMAL HIGH (ref 24–36)

## 2019-11-29 MED ORDER — HEPARIN SOD (PORK) LOCK FLUSH 100 UNIT/ML IV SOLN
500.0000 [IU] | Freq: Once | INTRAVENOUS | Status: AC
  Administered 2019-11-29: 500 [IU] via INTRAVENOUS
  Filled 2019-11-29: qty 5

## 2019-11-29 NOTE — Discharge Instructions (Signed)
Please drink fluids adequately.  Our scheduler will call you on Monday about your next appointments including chemotherapy

## 2019-11-29 NOTE — Discharge Summary (Signed)
Physician Discharge Summary  Patient ID: Jeremy Mack MRN: [id number] DOB/AGE: 05/31/1946 74 y.o.  Admit date: 11/28/2019 Discharge date: 11/29/2019  Primary Care Physician:  [name], [place]   Discharge Diagnoses: Tumor lysis syndrome, resolved  Present on Admission: . Tumor lysis syndrome   Discharge Medications:  Allergies as of 11/29/2019   No Known Allergies     Medication List    TAKE these medications   allopurinol 300 MG tablet Commonly known as: ZYLOPRIM Take 1 tablet (300 mg total) by mouth 2 (two) times daily.   amLODipine 10 MG tablet Commonly known as: NORVASC Take 1 tablet (10 mg total) by mouth daily.   apixaban 5 MG Tabs tablet Commonly known as: ELIQUIS Take 1 tablet (5 mg total) by mouth 2 (two) times daily.   aspirin 325 MG tablet Take 650 mg by mouth every 4 (four) hours as needed for mild pain or headache.   atorvastatin 40 MG tablet Commonly known as: LIPITOR Take 1 tablet (40 mg total) by mouth daily at 6 PM.   Fish Oil 1000 MG Caps Take 1,000 mg by mouth daily.   lidocaine-prilocaine cream Commonly known as: EMLA Apply 1 application topically as needed. What changed: reasons to take this   nicotine 14 mg/24hr patch Commonly known as: NICODERM CQ - dosed in mg/24 hours Place 1 patch (14 mg total) onto the skin daily.   ondansetron 8 MG tablet Commonly known as: Zofran Take 1 tablet (8 mg total) by mouth 2 (two) times daily as needed for refractory nausea / vomiting. Start on day 3 after carboplatin chemo.   pantoprazole 20 MG tablet Commonly known as: PROTONIX Take 1 tablet (20 mg total) by mouth daily.   polyvinyl alcohol 1.4 % ophthalmic solution Commonly known as: LIQUIFILM TEARS Place 1 drop into both eyes as needed for dry eyes.   prochlorperazine 10 MG tablet Commonly known as: COMPAZINE Take 1 tablet (10 mg total) by mouth every 6 (six) hours as needed (Nausea or vomiting).   traMADol 50 MG  tablet Commonly known as: ULTRAM Take 1 tablet (50 mg total) by mouth every 6 (six) hours as needed for moderate pain.        Disposition and Follow-up:   Significant Diagnostic Studies:  CT Abdomen Pelvis Wo Contrast  Result Date: 11/28/2019 CLINICAL DATA:  Restaging small cell lung cancer. EXAM: CT CHEST, ABDOMEN AND PELVIS WITHOUT CONTRAST TECHNIQUE: Multidetector CT imaging of the chest, abdomen and pelvis was performed following the standard protocol without IV contrast. COMPARISON:  07/30/2019 FINDINGS: CT CHEST FINDINGS Cardiovascular: Normal heart size. No pericardial effusion. Aortic atherosclerosis lad and left circumflex coronary artery calcifications. Mediastinum/Nodes: Normal appearance of the thyroid gland. The trachea appears patent and is midline. Normal appearance of the esophagus. Right paratracheal lymph node measures 2.3 cm. Previously 0.7 cm. Lower right paratracheal lymph node measures 1.8 cm, image 24/2. Previously 0.7 cm no enlarged supraclavicular or axillary lymph nodes. Lungs/Pleura: No pleural effusion. No airspace consolidation, atelectasis or pneumothorax. Right upper lobe pulmonary nodules are again noted in are increased from previous exam. Peripheral spiculated right upper lobe lung nodule measures 1 cm, image 60/4. Central right upper lobe perihilar nodule measures 1.1 cm, image 63/4. New from previous exam. Adjacent nodule measures 2.0 cm, image 65/4. Also new from previous exam. Musculoskeletal: Multifocal areas of mixed lytic and sclerotic bone metastases are noted. The appearance is similar to previous exam. CT ABDOMEN PELVIS FINDINGS Hepatobiliary: Multifocal liver metastases have progressed from previous  exam. Lesions are increased in size and number in the interval. For example, within lateral segment of left lobe of liver there is a 4.9 cm hypodense metastasis, image 54/2. Previously 2.1 cm. Dome of liver lesion measures 5.2 cm, image 46/2. Previously 1.8 cm  segment 5 liver lesion measures 1.6 cm, image 63/2. Previously 0.8 cm. Lesion within segment 7 measures 2.2 cm, image 53/2. Previously 1.3 cm. Stones noted layering within the dependent portion of the gallbladder. No biliary ductal dilatation. Pancreas: Unremarkable. No pancreatic ductal dilatation or surrounding inflammatory changes. Spleen: Normal in size without focal abnormality. Adrenals/Urinary Tract: Normal right adrenal gland. There is a solid nodule in the left adrenal gland measuring 2 cm, image 57/2. Previously this measured the same. No kidney mass or hydronephrosis identified. Urinary bladder unremarkable. Stomach/Bowel: Stomach is within normal limits. Appendix not confidently identified. Postop change involving the sigmoid colon is again noted, stable. No evidence of bowel wall thickening, distention, or inflammatory changes. Vascular/Lymphatic: Aortic atherosclerosis. No abdominopelvic adenopathy. Reproductive: Enlargement of the prostate gland is again noted. Other: No free fluid or fluid collections. Musculoskeletal: Heterogeneous bone mineralization within the bony pelvis and lumbar spine is similar to previous exam compatible with osseous metastasis. IMPRESSION: 1. Interval progression of liver metastases. 2. Increase in size and number of right upper lobe pulmonary nodules. New right paratracheal adenopathy 3. Stable appearance of multifocal mixed lytic and sclerotic bone metastases. 4. Aortic atherosclerosis. Coronary artery calcifications noted. Aortic Atherosclerosis (ICD10-I70.0). Electronically Signed   By: Kerby Moors M.D.   On: 11/28/2019 14:24   CT Chest Wo Contrast  Result Date: 11/28/2019 CLINICAL DATA:  Restaging small cell lung cancer. EXAM: CT CHEST, ABDOMEN AND PELVIS WITHOUT CONTRAST TECHNIQUE: Multidetector CT imaging of the chest, abdomen and pelvis was performed following the standard protocol without IV contrast. COMPARISON:  07/30/2019 FINDINGS: CT CHEST FINDINGS  Cardiovascular: Normal heart size. No pericardial effusion. Aortic atherosclerosis lad and left circumflex coronary artery calcifications. Mediastinum/Nodes: Normal appearance of the thyroid gland. The trachea appears patent and is midline. Normal appearance of the esophagus. Right paratracheal lymph node measures 2.3 cm. Previously 0.7 cm. Lower right paratracheal lymph node measures 1.8 cm, image 24/2. Previously 0.7 cm no enlarged supraclavicular or axillary lymph nodes. Lungs/Pleura: No pleural effusion. No airspace consolidation, atelectasis or pneumothorax. Right upper lobe pulmonary nodules are again noted in are increased from previous exam. Peripheral spiculated right upper lobe lung nodule measures 1 cm, image 60/4. Central right upper lobe perihilar nodule measures 1.1 cm, image 63/4. New from previous exam. Adjacent nodule measures 2.0 cm, image 65/4. Also new from previous exam. Musculoskeletal: Multifocal areas of mixed lytic and sclerotic bone metastases are noted. The appearance is similar to previous exam. CT ABDOMEN PELVIS FINDINGS Hepatobiliary: Multifocal liver metastases have progressed from previous exam. Lesions are increased in size and number in the interval. For example, within lateral segment of left lobe of liver there is a 4.9 cm hypodense metastasis, image 54/2. Previously 2.1 cm. Dome of liver lesion measures 5.2 cm, image 46/2. Previously 1.8 cm segment 5 liver lesion measures 1.6 cm, image 63/2. Previously 0.8 cm. Lesion within segment 7 measures 2.2 cm, image 53/2. Previously 1.3 cm. Stones noted layering within the dependent portion of the gallbladder. No biliary ductal dilatation. Pancreas: Unremarkable. No pancreatic ductal dilatation or surrounding inflammatory changes. Spleen: Normal in size without focal abnormality. Adrenals/Urinary Tract: Normal right adrenal gland. There is a solid nodule in the left adrenal gland measuring 2 cm, image 57/2. Previously  this measured the  same. No kidney mass or hydronephrosis identified. Urinary bladder unremarkable. Stomach/Bowel: Stomach is within normal limits. Appendix not confidently identified. Postop change involving the sigmoid colon is again noted, stable. No evidence of bowel wall thickening, distention, or inflammatory changes. Vascular/Lymphatic: Aortic atherosclerosis. No abdominopelvic adenopathy. Reproductive: Enlargement of the prostate gland is again noted. Other: No free fluid or fluid collections. Musculoskeletal: Heterogeneous bone mineralization within the bony pelvis and lumbar spine is similar to previous exam compatible with osseous metastasis. IMPRESSION: 1. Interval progression of liver metastases. 2. Increase in size and number of right upper lobe pulmonary nodules. New right paratracheal adenopathy 3. Stable appearance of multifocal mixed lytic and sclerotic bone metastases. 4. Aortic atherosclerosis. Coronary artery calcifications noted. Aortic Atherosclerosis (ICD10-I70.0). Electronically Signed   By: Kerby Moors M.D.   On: 11/28/2019 14:24    Discharge Laboratory Values: CBC Latest Ref Rng & Units 11/28/2019 11/28/2019 11/26/2019  WBC 4.0 - 10.5 K/uL 10.2 9.3 9.9  Hemoglobin 13.0 - 17.0 g/dL 12.7(L) 14.7 16.4  Hematocrit 39.0 - 52.0 % 38.1(L) 42.8 48.6  Platelets 150 - 400 K/uL 108(L) 131(L) 178   CMP Latest Ref Rng & Units 11/29/2019 11/28/2019 11/26/2019  Glucose 70 - 99 mg/dL 100(H) 140(H) 127(H)  BUN 8 - 23 mg/dL 20 31(H) 45(H)  Creatinine 0.61 - 1.24 mg/dL 1.11 1.78(H) 1.87(H)  Sodium 135 - 145 mmol/L 137 138 138  Potassium 3.5 - 5.1 mmol/L 3.9 4.5 4.5  Chloride 98 - 111 mmol/L 106 103 102  CO2 22 - 32 mmol/L 21(L) 20(L) 22  Calcium 8.9 - 10.3 mg/dL 9.8 10.9(H) 10.9(H)  Total Protein 6.5 - 8.1 g/dL - 8.2(H) 9.0(H)  Total Bilirubin 0.3 - 1.2 mg/dL - 0.5 1.2  Alkaline Phos 38 - 126 U/L - 262(H) 227(H)  AST 15 - 41 U/L - 91(H) 66(H)  ALT 0 - 44 U/L - 45(H) 34    Brief H and P: For complete  details please refer to admission H and P, but in brief, patient presented with generalized weakness, mild confusion, decreased appetite, labs showed uric acid 10.4, acute renal failure with creatinine 1.78, and a mild hypercalcemia.  He initially did not respond to IV fluids in the clinic, and was admitted to [facility] for further management November 28, 2019.   Physical Exam at Discharge: BP (!) 146/86 (BP Location: Left Arm)   Pulse 77   Temp 98 F (36.7 C) (Oral)   Resp 17   Ht 5\' 11"  (1.803 m)   Wt 145 lb 11.6 oz (66.1 kg)   SpO2 100%   BMI 20.32 kg/m  GENERAL:alert and oriented, no distress and comfortable SKIN: skin color, texture, turgor are normal, no rashes or significant lesions EYES: normal, conjunctiva are pink and non-injected, sclera clear OROPHARYNX:no exudate, no erythema and lips, buccal mucosa, and tongue normal  NECK: supple, thyroid normal size, non-tender, without nodularity LYMPH:  no palpable lymphadenopathy in the cervical, axillary or inguinal LUNGS: clear to auscultation and percussion with normal breathing effort HEART: regular rate & rhythm and no murmurs and no lower extremity edema ABDOMEN:abdomen soft, non-tender and normal bowel sounds Musculoskeletal:no cyanosis of digits and no clubbing  PSYCH: alert & oriented x 3 with fluent speech NEURO: no focal motor/sensory deficits   Hospital Course:  He was started with normal saline 150 mL/h, and received 1 dose rasburicase 6 mg November 28, 2019.  He remained to be hemodynamically stable overnight, and became more awake, alert and oriented in  the morning.  He overall feels well, no significant pain or other complaints.  Repeat labs showed resolved AKI and uric acid <0.5.  We will discharge him to home today.  I had a long conversation with patient and his wife, about his recent disease progression on CT scan, and that clinical deterioration in a newly developed tumor lysis syndrome, secondary to cancer  progression.  We discussed the second line chemotherapy option, with weekly Taxol or biweekly irinotecan, potential benefit and side effects reviewed with patient and his wife, he agrees to proceed.  Plan to start next week as soon as possible.  We also discussed overall very poor prognosis, his life expectancy is likely to be a few months or shorter without more cancer treatment.  I recommend DNR and DNI, and we reviewed the hospice home care if he continues to deteriorate.  He voiced good understanding, and will think about it.  All questions were answered.  Diet:  Regular   Activity:  As tolerated   Condition at Discharge:   Charlotta Newton 11/29/2019

## 2019-11-29 NOTE — Progress Notes (Signed)
DISCONTINUE ON PATHWAY REGIMEN - Small Cell Lung     Cycles 1 through 4, every 21 days:     Atezolizumab      Carboplatin      Etoposide    Cycles 5 and beyond, every 21 days:     Atezolizumab   **Always confirm dose/schedule in your pharmacy ordering system**  REASON: Disease Progression PRIOR TREATMENT: [id number]: Atezolizumab 1,200 mg D1 + Carboplatin AUC=5 D1 + Etoposide 100 mg/m2 D1-3 q21 Days x 4 Cycles, Followed by Atezolizumab 1,200 mg Maintenance Until Progression or Unacceptable Toxicity TREATMENT RESPONSE: Partial Response (PR)  Small Cell Lung - No Medical Intervention - Off Treatment.  Patient Characteristics: Relapsed or Progressive Disease, Second Line, Relapse < 3 Months Therapeutic Status: Relapsed or Progressive Disease Line of Therapy: Second Line Time to Relapse: Relapse < 3 Months

## 2019-12-02 ENCOUNTER — Other Ambulatory Visit: Payer: Self-pay

## 2019-12-02 LAB — GLUCOSE, CAPILLARY: Glucose-Capillary: 106 mg/dL — ABNORMAL HIGH (ref 70–99)

## 2019-12-02 NOTE — Patient Outreach (Signed)
[facility]) Care Management  12/02/2019  Jeremy Mack Sep 17, 1946 [id number]   Telephone call to wife.  She reports that patient is doing the best that he can do. She reports that patient is drinking and eating some but not much.  She thinks he eats what he does to make her happy.  Encouraged use of supplements and drinking plenty of fluids.  Wife admits that MD told her that patient cancer is terminal and moves fast.  Asked her if the physician mentioned hospice to her she states no.  We discussed hospice as home care to assist with disease process and decline.  She states she is aware of hospice.  Advised her that she could discuss with physician of she feels she is ready or she can call herself. She verbalized understanding and declines any needs.    Plan: RN CM will contact next week and wife agreeable.    [name], RN, MSN [facility] Care Management Coordinator Direct Line [phone] Cell [phone] Toll Free: [phone]  Fax: [phone]

## 2019-12-03 ENCOUNTER — Telehealth: Payer: Self-pay

## 2019-12-03 NOTE — Telephone Encounter (Signed)
vm left reviewing tomorrows appointments for lab, flush, lacie, and new treatment.

## 2019-12-03 NOTE — Progress Notes (Deleted)
Jeremy Mack   [phone] Fax:([phone]) [phone]   Clinic Follow up Note   Patient Care Team: [name], [place] as PCP - General (Internal Medicine) Virgina Evener, Dawn, RN (Inactive) as Oncology Nurse Navigator Jon Billings, RN as [place] Management Luciana Axe, Angelia Mould, [facility] (Pharmacist) Kyung Rudd, MD as Consulting Physician (Radiation Oncology) 12/03/2019  CHIEF COMPLAINT: F/u small cell lung cancer, tumor lysis syndrome   SUMMARY OF ONCOLOGIC HISTORY: Oncology History Overview Note  Cancer Staging Small cell lung cancer ([place]) Staging form: Lung, AJCC 8th Edition - Clinical: Stage IVB (cTX, cN2, pM1c) - Signed by Truitt Merle, MD on 06/06/2019    Primary malignant neoplasm of bronchus of left upper lobe ([place])  07/11/2018 Imaging   CT AP 07/11/18 IMPRESSION: 1. Significantly improved appearance of the pancreas and peripancreatic tissues. Improved to resolved peripancreatic edema with resolved and significantly improved peripancreatic lesions. The remaining lesion was felt to represent abscess on prior endoscopic ultrasound sampling. Pseudocyst or infected pseudocyst would be the favored imaging diagnosis. 2. Persistent but improved mass effect upon the inferior aspect of the portal vein and superior aspect of the superior mesenteric vein. 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 3. Aortic Atherosclerosis (ICD10-I70.0). Bilateral common iliac artery ectasia. 4. A left adrenal nodule is indeterminate on precontrast imaging and may have decreased in size since the prior. Consider dedicated adrenal protocol CT. This could either be performed in 6 months to confirm size stability or more acutely, depending on clinical concern. 5. A splenic lesion is indeterminate and can be re-evaluated on follow-up. 6. Hepatic morphology suspicious for cirrhosis. 7. Cholelithiasis. 8.  Emphysema (ICD10-J43.9).   05/01/2019  Imaging   CT AP 05/01/19  IMPRESSION: 1. New diffuse liver metastases. 2. New mild retroperitoneal lymphadenopathy, consistent with metastatic disease. 3. Resolution of previously seen pancreatic mass since previous study. No radiographic evidence of acute pancreatitis. 4. Stable left adrenal mass, consistent with adrenal adenoma. 5. Stable markedly enlarged prostate and findings of chronic bladder outlet obstruction. 6. Colonic diverticulosis. No radiographic evidence of diverticulitis. 7. Cholelithiasis.   Aortic Atherosclerosis (ICD10-I70.0).   05/28/2019 PET scan   PET 05/28/19  IMPRESSION: 1. Examination is positive for widespread FDG avid liver metastases. Liver lesions are too numerous to count. 2. Hypermetabolic mediastinal and upper abdominal nodal metastases. 3. Left upper lobe and right upper lobe hypermetabolic pulmonary nodules consistent with pulmonary metastasis. 4. Innumerable liver metastases noted within the axial and proximal appendicular skeleton. Bone lesions are too numerous to count. Some of these have a corresponding lytic lesion on CT images. Others appear occult on the corresponding CT images. 5. Solid hyperdense left adrenal nodule is not significantly changed from 05/01/2019 and exhibits mild FDG uptake. Indeterminate. 6. Prostate gland enlargement. Focal area of increased uptake within the left posterior gland is indeterminate.   06/04/2019 Initial Biopsy   Biopsy 06/04/19  Diagnosis Liver, needle/core biopsy - SMALL CELL CARCINOMA. SEE COMMENT.   06/06/2019 Initial Diagnosis   Small cell lung cancer ([place])   06/06/2019 Cancer Staging   Staging form: Lung, AJCC 8th Edition - Clinical: Stage IVB (cTX, cN2, pM1c) - Signed by Truitt Merle, MD on 06/06/2019   06/10/2019 -  Chemotherapy   Carboplatin Day 1 and Etopside day 1-3 with immunotherapy atezolizumab (Tecentriq) on day 1, every 3 weeks for 6 cycles starting 06/10/19. Plan to continue maintenance Tecentriq  afterward.    06/14/2019 Imaging    MRI brain 06/14/19  IMPRESSION: No brain metastases. Chronic small-vessel  ischemic changes of the pons and cerebral hemispheric white matter.   Probable metastatic lesions within the upper cervical spine as seen. No sign of spinal canal encroachment.   07/30/2019 Imaging   CT CAP W Contrast  IMPRESSION: 1. Marked interval decrease in size of the hypermetabolic bilateral pulmonary nodules seen on previous PET-CT. 2. Clear interval decrease in the numerous hepatic metastases. 3. Interval resolution of mediastinal and upper abdominal lymphadenopathy seen previously. 4. Stable left adrenal nodule. This shows low level FDG uptake on prior PET-CT and is indeterminate. Continued attention on follow-up recommended. 5. No new or progressive findings to suggest disease progression. 6. Stable hypoenhancing splenic lesion. Attention on follow-up recommended. 7. Prostatomegaly. 8.  Aortic Atherosclerois (ICD10-170.0)     11/07/2019 - 11/21/2019 Radiation Therapy   Radiation to brain with Dr. Lisbeth Renshaw 11/07/19-11/21/19   11/28/2019 Imaging   CT CAP WO Contrast  IMPRESSION: 1. Interval progression of liver metastases. 2. Increase in size and number of right upper lobe pulmonary nodules. New right paratracheal adenopathy 3. Stable appearance of multifocal mixed lytic and sclerotic bone metastases. 4. Aortic atherosclerosis. Coronary artery calcifications noted.   Aortic Atherosclerosis (ICD10-I70.0).   12/04/2019 -  Chemotherapy   The patient had PACLitaxel (TAXOL) 144 mg in sodium chloride 0.9 % 250 mL chemo infusion (</= 80mg /m2), 80 mg/m2, Intravenous,  Once, 0 of 4 cycles  for chemotherapy treatment.      CURRENT THERAPY:  -First lineCarboplatin Day 1 and Etopside day 1-3 with immunotherapyatezolizumab(Tecentriq) on day 1, every 3 weeks for 6 cyclesstarting 06/10/19. -He is now on maintenance Tecentriq every 3 weeks. -Radiation to brain with  Dr. Lisbeth Renshaw 11/07/19-11/21/19  INTERVAL HISTORY: Mr. Derks returns for f/u as scheduled. He was recently admitted for TLS with AKI, hypercalcemia and hyperuricemia 10.4 on 12/31 in the setting of cancer progression. He was discharged on 11/29/19.    REVIEW OF SYSTEMS:   Constitutional: Denies fevers, chills or abnormal weight loss Eyes: Denies blurriness of vision Ears, nose, mouth, throat, and face: Denies mucositis or sore throat Respiratory: Denies cough, dyspnea or wheezes Cardiovascular: Denies palpitation, chest discomfort or lower extremity swelling Gastrointestinal:  Denies nausea, heartburn or change in bowel habits Skin: Denies abnormal skin rashes Lymphatics: Denies new lymphadenopathy or easy bruising Neurological:Denies numbness, tingling or new weaknesses Behavioral/Psych: Mood is stable, no new changes  All other systems were reviewed with the patient and are negative.  MEDICAL HISTORY:  Past Medical History:  Diagnosis Date  . Cancer ([place])   . GERD (gastroesophageal reflux disease)   . Hyperlipemia   . Hypertension     SURGICAL HISTORY: Past Surgical History:  Procedure Laterality Date  . arm fracture surgery Right   . colon polyps removed    . ESOPHAGOGASTRODUODENOSCOPY (EGD) WITH PROPOFOL N/A 06/14/2018   Procedure: ESOPHAGOGASTRODUODENOSCOPY (EGD) WITH PROPOFOL;  Surgeon: [name], MD;  Location: [facility] ENDOSCOPY;  Service: Endoscopy;  Laterality: N/A;  . EUS N/A 06/14/2018   Procedure: ESOPHAGEAL ENDOSCOPIC ULTRASOUND (EUS) RADIAL;  Surgeon: [name], MD;  Location: [facility] ENDOSCOPY;  Service: Endoscopy;  Laterality: N/A;  . FINE NEEDLE ASPIRATION N/A 06/14/2018   Procedure: FINE NEEDLE ASPIRATION (FNA) LINEAR;  Surgeon: [name], MD;  Location: [facility] ENDOSCOPY;  Service: Endoscopy;  Laterality: N/A;  . IR IMAGING GUIDED PORT INSERTION  08/26/2019  . TONSILLECTOMY      I have reviewed the social history and family history with the patient and they are  unchanged from previous note.  ALLERGIES:  has  No Known Allergies.  MEDICATIONS:  Current Outpatient Medications  Medication Sig Dispense Refill  . allopurinol (ZYLOPRIM) 300 MG tablet Take 1 tablet (300 mg total) by mouth 2 (two) times daily. 180 tablet 3  . amLODipine (NORVASC) 10 MG tablet Take 1 tablet (10 mg total) by mouth daily. 90 tablet 3  . apixaban (ELIQUIS) 5 MG TABS tablet Take 1 tablet (5 mg total) by mouth 2 (two) times daily. 60 tablet 3  . aspirin 325 MG tablet Take 650 mg by mouth every 4 (four) hours as needed for mild pain or headache.    Marland Kitchen atorvastatin (LIPITOR) 40 MG tablet Take 1 tablet (40 mg total) by mouth daily at 6 PM. 90 tablet 3  . lidocaine-prilocaine (EMLA) cream Apply 1 application topically as needed. (Patient taking differently: Apply 1 application topically as needed (port access). ) 30 g 3  . nicotine (NICODERM CQ - DOSED IN MG/24 HOURS) 14 mg/24hr patch Place 1 patch (14 mg total) onto the skin daily. (Patient not taking: Reported on 11/26/2019) 28 patch 0  . Omega-3 Fatty Acids (FISH OIL) 1000 MG CAPS Take 1,000 mg by mouth daily.     . ondansetron (ZOFRAN) 8 MG tablet Take 1 tablet (8 mg total) by mouth 2 (two) times daily as needed for refractory nausea / vomiting. Start on day 3 after carboplatin chemo. 30 tablet 1  . pantoprazole (PROTONIX) 20 MG tablet Take 1 tablet (20 mg total) by mouth daily. 90 tablet 3  . polyvinyl alcohol (LIQUIFILM TEARS) 1.4 % ophthalmic solution Place 1 drop into both eyes as needed for dry eyes.    Marland Kitchen prochlorperazine (COMPAZINE) 10 MG tablet Take 1 tablet (10 mg total) by mouth every 6 (six) hours as needed (Nausea or vomiting). 30 tablet 1  . traMADol (ULTRAM) 50 MG tablet Take 1 tablet (50 mg total) by mouth every 6 (six) hours as needed for moderate pain. 60 tablet 0   No current facility-administered medications for this visit.    PHYSICAL EXAMINATION: ECOG PERFORMANCE STATUS: {CHL ONC ECOG PS:[id number]}  There  were no vitals filed for this visit. There were no vitals filed for this visit.  GENERAL:alert, no distress and comfortable SKIN: skin color, texture, turgor are normal, no rashes or significant lesions EYES: normal, Conjunctiva are pink and non-injected, sclera clear OROPHARYNX:no exudate, no erythema and lips, buccal mucosa, and tongue normal  NECK: supple, thyroid normal size, non-tender, without nodularity LYMPH:  no palpable lymphadenopathy in the cervical, axillary or inguinal LUNGS: clear to auscultation and percussion with normal breathing effort HEART: regular rate & rhythm and no murmurs and no lower extremity edema ABDOMEN:abdomen soft, non-tender and normal bowel sounds Musculoskeletal:no cyanosis of digits and no clubbing  NEURO: alert & oriented x 3 with fluent speech, no focal motor/sensory deficits  LABORATORY DATA:  I have reviewed the data as listed CBC Latest Ref Rng & Units 11/28/2019 11/28/2019 11/26/2019  WBC 4.0 - 10.5 K/uL 10.2 9.3 9.9  Hemoglobin 13.0 - 17.0 g/dL 12.7(L) 14.7 16.4  Hematocrit 39.0 - 52.0 % 38.1(L) 42.8 48.6  Platelets 150 - 400 K/uL 108(L) 131(L) 178     CMP Latest Ref Rng & Units 11/29/2019 11/28/2019 11/26/2019  Glucose 70 - 99 mg/dL 100(H) 140(H) 127(H)  BUN 8 - 23 mg/dL 20 31(H) 45(H)  Creatinine 0.61 - 1.24 mg/dL 1.11 1.78(H) 1.87(H)  Sodium 135 - 145 mmol/L 137 138 138  Potassium 3.5 - 5.1 mmol/L 3.9 4.5 4.5  Chloride 98 - 111  mmol/L 106 103 102  CO2 22 - 32 mmol/L 21(L) 20(L) 22  Calcium 8.9 - 10.3 mg/dL 9.8 10.9(H) 10.9(H)  Total Protein 6.5 - 8.1 g/dL - 8.2(H) 9.0(H)  Total Bilirubin 0.3 - 1.2 mg/dL - 0.5 1.2  Alkaline Phos 38 - 126 U/L - 262(H) 227(H)  AST 15 - 41 U/L - 91(H) 66(H)  ALT 0 - 44 U/L - 45(H) 34      RADIOGRAPHIC STUDIES: I have personally reviewed the radiological images as listed and agreed with the findings in the report. No results found.   ASSESSMENT & PLAN:  No problem-specific Assessment & Plan notes  found for this encounter.   No orders of the defined types were placed in this encounter.  All questions were answered. The patient knows to call the clinic with any problems, questions or concerns. No barriers to learning was detected. I spent {CHL ONC TIME VISIT - XKGYJ:[id number]} counseling the patient face to face. The total time spent in the appointment was {CHL ONC TIME VISIT - OVZCH:[id number]} and more than 50% was on counseling and review of test results     Alla Feeling, NP 12/03/19

## 2019-12-04 ENCOUNTER — Inpatient Hospital Stay: Payer: Medicare (Managed Care)

## 2019-12-04 ENCOUNTER — Encounter: Payer: Self-pay | Admitting: Nurse Practitioner

## 2019-12-04 ENCOUNTER — Inpatient Hospital Stay: Payer: Medicare (Managed Care) | Attending: Nurse Practitioner

## 2019-12-04 ENCOUNTER — Inpatient Hospital Stay: Payer: Medicare (Managed Care) | Admitting: Nurse Practitioner

## 2019-12-04 ENCOUNTER — Inpatient Hospital Stay (HOSPITAL_BASED_OUTPATIENT_CLINIC_OR_DEPARTMENT_OTHER): Payer: Medicare (Managed Care) | Admitting: Nurse Practitioner

## 2019-12-04 ENCOUNTER — Other Ambulatory Visit: Payer: Self-pay

## 2019-12-04 VITALS — BP 105/79 | HR 120 | Temp 98.0°F | Resp 18 | Ht 71.0 in

## 2019-12-04 VITALS — BP 148/90 | HR 102 | Temp 98.1°F | Resp 18

## 2019-12-04 DIAGNOSIS — C787 Secondary malignant neoplasm of liver and intrahepatic bile duct: Secondary | ICD-10-CM | POA: Diagnosis not present

## 2019-12-04 DIAGNOSIS — Z7901 Long term (current) use of anticoagulants: Secondary | ICD-10-CM | POA: Insufficient documentation

## 2019-12-04 DIAGNOSIS — Z7982 Long term (current) use of aspirin: Secondary | ICD-10-CM | POA: Diagnosis not present

## 2019-12-04 DIAGNOSIS — C3412 Malignant neoplasm of upper lobe, left bronchus or lung: Secondary | ICD-10-CM | POA: Insufficient documentation

## 2019-12-04 DIAGNOSIS — Z79899 Other long term (current) drug therapy: Secondary | ICD-10-CM | POA: Diagnosis not present

## 2019-12-04 DIAGNOSIS — Z9221 Personal history of antineoplastic chemotherapy: Secondary | ICD-10-CM | POA: Insufficient documentation

## 2019-12-04 DIAGNOSIS — C772 Secondary and unspecified malignant neoplasm of intra-abdominal lymph nodes: Secondary | ICD-10-CM | POA: Insufficient documentation

## 2019-12-04 DIAGNOSIS — C3490 Malignant neoplasm of unspecified part of unspecified bronchus or lung: Secondary | ICD-10-CM

## 2019-12-04 DIAGNOSIS — C7951 Secondary malignant neoplasm of bone: Secondary | ICD-10-CM | POA: Diagnosis not present

## 2019-12-04 DIAGNOSIS — N400 Enlarged prostate without lower urinary tract symptoms: Secondary | ICD-10-CM | POA: Diagnosis not present

## 2019-12-04 DIAGNOSIS — Z923 Personal history of irradiation: Secondary | ICD-10-CM | POA: Diagnosis not present

## 2019-12-04 DIAGNOSIS — Z5111 Encounter for antineoplastic chemotherapy: Secondary | ICD-10-CM | POA: Insufficient documentation

## 2019-12-04 DIAGNOSIS — F17210 Nicotine dependence, cigarettes, uncomplicated: Secondary | ICD-10-CM | POA: Diagnosis not present

## 2019-12-04 DIAGNOSIS — I10 Essential (primary) hypertension: Secondary | ICD-10-CM | POA: Diagnosis not present

## 2019-12-04 DIAGNOSIS — E785 Hyperlipidemia, unspecified: Secondary | ICD-10-CM | POA: Diagnosis not present

## 2019-12-04 DIAGNOSIS — N179 Acute kidney failure, unspecified: Secondary | ICD-10-CM | POA: Diagnosis not present

## 2019-12-04 DIAGNOSIS — Z7189 Other specified counseling: Secondary | ICD-10-CM

## 2019-12-04 DIAGNOSIS — J439 Emphysema, unspecified: Secondary | ICD-10-CM | POA: Diagnosis not present

## 2019-12-04 LAB — CMP (CANCER CENTER ONLY)
ALT: 56 U/L — ABNORMAL HIGH (ref 0–44)
AST: 87 U/L — ABNORMAL HIGH (ref 15–41)
Albumin: 3.4 g/dL — ABNORMAL LOW (ref 3.5–5.0)
Alkaline Phosphatase: 336 U/L — ABNORMAL HIGH (ref 38–126)
Anion gap: 15 (ref 5–15)
BUN: 19 mg/dL (ref 8–23)
CO2: 24 mmol/L (ref 22–32)
Calcium: 11.2 mg/dL — ABNORMAL HIGH (ref 8.9–10.3)
Chloride: 100 mmol/L (ref 98–111)
Creatinine: 1.16 mg/dL (ref 0.61–1.24)
GFR, Est AFR Am: >60 mL/min (ref >60)
GFR, Estimated: >60 mL/min (ref >60)
Glucose, Bld: 111 mg/dL — ABNORMAL HIGH (ref 70–99)
Potassium: 4.3 mmol/L (ref 3.5–5.1)
Sodium: 139 mmol/L (ref 135–145)
Total Bilirubin: 0.7 mg/dL (ref 0.3–1.2)
Total Protein: 8.2 g/dL — ABNORMAL HIGH (ref 6.5–8.1)

## 2019-12-04 LAB — CBC WITH DIFFERENTIAL (CANCER CENTER ONLY)
Abs Immature Granulocytes: 0.42 10*3/uL — ABNORMAL HIGH (ref 0.00–0.07)
Basophils Absolute: 0 10*3/uL (ref 0.0–0.1)
Basophils Relative: 0 %
Eosinophils Absolute: 0 10*3/uL (ref 0.0–0.5)
Eosinophils Relative: 0 %
HCT: 42.5 % (ref 39.0–52.0)
Hemoglobin: 14.1 g/dL (ref 13.0–17.0)
Immature Granulocytes: 4 %
Lymphocytes Relative: 19 %
Lymphs Abs: 2 10*3/uL (ref 0.7–4.0)
MCH: 29.8 pg (ref 26.0–34.0)
MCHC: 33.2 g/dL (ref 30.0–36.0)
MCV: 89.9 fL (ref 80.0–100.0)
Monocytes Absolute: 0.9 10*3/uL (ref 0.1–1.0)
Monocytes Relative: 9 %
Neutro Abs: 6.9 10*3/uL (ref 1.7–7.7)
Neutrophils Relative %: 68 %
Platelet Count: 131 10*3/uL — ABNORMAL LOW (ref 150–400)
RBC: 4.73 MIL/uL (ref 4.22–5.81)
RDW: 14.6 % (ref 11.5–15.5)
WBC Count: 10.2 10*3/uL (ref 4.0–10.5)
nRBC: 0.6 % — ABNORMAL HIGH (ref 0.0–0.2)

## 2019-12-04 LAB — URIC ACID: Uric Acid, Serum: 2.8 mg/dL — ABNORMAL LOW (ref 3.7–8.6)

## 2019-12-04 MED ORDER — DEXAMETHASONE SODIUM PHOSPHATE 10 MG/ML IJ SOLN
10.0000 mg | Freq: Once | INTRAMUSCULAR | Status: AC
  Administered 2019-12-04: 10 mg via INTRAVENOUS

## 2019-12-04 MED ORDER — SODIUM CHLORIDE 0.9 % IV SOLN
Freq: Once | INTRAVENOUS | Status: AC
  Filled 2019-12-04: qty 250

## 2019-12-04 MED ORDER — SODIUM CHLORIDE 0.9% FLUSH
10.0000 mL | INTRAVENOUS | Status: DC | PRN
  Administered 2019-12-04: 10 mL
  Filled 2019-12-04: qty 10

## 2019-12-04 MED ORDER — DIPHENHYDRAMINE HCL 50 MG/ML IJ SOLN
INTRAMUSCULAR | Status: AC
  Filled 2019-12-04: qty 1

## 2019-12-04 MED ORDER — FAMOTIDINE IN NACL 20-0.9 MG/50ML-% IV SOLN
20.0000 mg | Freq: Once | INTRAVENOUS | Status: AC
  Administered 2019-12-04: 20 mg via INTRAVENOUS

## 2019-12-04 MED ORDER — DIPHENHYDRAMINE HCL 50 MG/ML IJ SOLN
25.0000 mg | Freq: Once | INTRAMUSCULAR | Status: AC
  Administered 2019-12-04: 25 mg via INTRAVENOUS

## 2019-12-04 MED ORDER — HEPARIN SOD (PORK) LOCK FLUSH 100 UNIT/ML IV SOLN
500.0000 [IU] | Freq: Once | INTRAVENOUS | Status: AC | PRN
  Administered 2019-12-04: 500 [IU]
  Filled 2019-12-04: qty 5

## 2019-12-04 MED ORDER — SODIUM CHLORIDE 0.9 % IV SOLN
80.0000 mg/m2 | Freq: Once | INTRAVENOUS | Status: AC
  Administered 2019-12-04: 144 mg via INTRAVENOUS
  Filled 2019-12-04: qty 24

## 2019-12-04 MED ORDER — DEXAMETHASONE SODIUM PHOSPHATE 10 MG/ML IJ SOLN
INTRAMUSCULAR | Status: AC
  Filled 2019-12-04: qty 1

## 2019-12-04 MED ORDER — FAMOTIDINE IN NACL 20-0.9 MG/50ML-% IV SOLN
INTRAVENOUS | Status: AC
  Filled 2019-12-04: qty 50

## 2019-12-04 MED ORDER — SODIUM CHLORIDE 0.9 % IV SOLN: 10.0000 mg | Freq: Once | INTRAVENOUS | Status: DC

## 2019-12-04 NOTE — Progress Notes (Addendum)
[place]   [phone] [phone]   Clinic Follow up Note   Patient Care Team: [name], [place] as PCP - General (Internal Medicine) Virgina Evener, Dawn, RN (Inactive) as Oncology Nurse Navigator Jon Billings, RN as [place] [facility] Luciana Axe, Angelia Mould, [facility] (Pharmacist) Kyung Rudd, MD as Consulting Physician (Radiation Oncology) 12/04/2019  CHIEF COMPLAINT: F/u metastatic small cell lung cancer, TLS  SUMMARY OF ONCOLOGIC HISTORY: Oncology History Overview Note  Cancer Staging Small cell lung cancer ([place]) Staging form: Lung, AJCC 8th Edition - Clinical: Stage IVB (cTX, cN2, pM1c) - Signed by Truitt Merle, MD on 06/06/2019    Primary malignant neoplasm of bronchus of left upper lobe ([place])  07/11/2018 Imaging   CT AP 07/11/18 IMPRESSION: 1. Significantly improved appearance of the pancreas and peripancreatic tissues. Improved to resolved peripancreatic edema with resolved and significantly improved peripancreatic lesions. The remaining lesion was felt to represent abscess on prior endoscopic ultrasound sampling. Pseudocyst or infected pseudocyst would be the favored imaging diagnosis. 2. Persistent but improved mass effect upon the inferior aspect of the portal vein and superior aspect of the superior mesenteric vein. 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 3. Aortic Atherosclerosis (ICD10-I70.0). Bilateral common iliac artery ectasia. 4. A left adrenal nodule is indeterminate on precontrast imaging and may have decreased in size since the prior. Consider dedicated adrenal protocol CT. This could either be performed in 6 months to confirm size stability or more acutely, depending on clinical concern. 5. A splenic lesion is indeterminate and can be re-evaluated on follow-up. 6. Hepatic morphology suspicious for cirrhosis. 7. Cholelithiasis. 8.  Emphysema (ICD10-J43.9).   05/01/2019 Imaging   CT AP 05/01/19  IMPRESSION: 1. New diffuse liver metastases. 2. New mild retroperitoneal lymphadenopathy, consistent with metastatic disease. 3. Resolution of previously seen pancreatic mass since previous study. No radiographic evidence of acute pancreatitis. 4. Stable left adrenal mass, consistent with adrenal adenoma. 5. Stable markedly enlarged prostate and findings of chronic bladder outlet obstruction. 6. Colonic diverticulosis. No radiographic evidence of diverticulitis. 7. Cholelithiasis.   Aortic Atherosclerosis (ICD10-I70.0).   05/28/2019 PET scan   PET 05/28/19  IMPRESSION: 1. Examination is positive for widespread FDG avid liver metastases. Liver lesions are too numerous to count. 2. Hypermetabolic mediastinal and upper abdominal nodal metastases. 3. Left upper lobe and right upper lobe hypermetabolic pulmonary nodules consistent with pulmonary metastasis. 4. Innumerable liver metastases noted within the axial and proximal appendicular skeleton. Bone lesions are too numerous to count. Some of these have a corresponding lytic lesion on CT images. Others appear occult on the corresponding CT images. 5. Solid hyperdense left adrenal nodule is not significantly changed from 05/01/2019 and exhibits mild FDG uptake. Indeterminate. 6. Prostate gland enlargement. Focal area of increased uptake within the left posterior gland is indeterminate.   06/04/2019 Initial Biopsy   Biopsy 06/04/19  Diagnosis Liver, needle/core biopsy - SMALL CELL CARCINOMA. SEE COMMENT.   06/06/2019 Initial Diagnosis   Small cell lung cancer ([place])   06/06/2019 Cancer Staging   Staging form: Lung, AJCC 8th Edition - Clinical: Stage IVB (cTX, cN2, pM1c) - Signed by Truitt Merle, MD on 06/06/2019   06/10/2019 -  Chemotherapy   Carboplatin Day 1 and [place]side day 1-3 with immunotherapy atezolizumab (Tecentriq) on day 1, every 3 weeks for 6 cycles starting 06/10/19. Plan to continue maintenance Tecentriq afterward.      06/14/2019 Imaging    MRI brain 06/14/19  IMPRESSION: No brain metastases. Chronic small-vessel ischemic changes  of the pons and cerebral hemispheric white matter.   Probable metastatic lesions within the upper cervical spine as seen. No sign of spinal canal encroachment.   07/30/2019 Imaging   CT CAP W Contrast  IMPRESSION: 1. Marked interval decrease in size of the hypermetabolic bilateral pulmonary nodules seen on previous PET-CT. 2. Clear interval decrease in the numerous hepatic metastases. 3. Interval resolution of mediastinal and upper abdominal lymphadenopathy seen previously. 4. Stable left adrenal nodule. This shows low level FDG uptake on prior PET-CT and is indeterminate. Continued attention on follow-up recommended. 5. No new or progressive findings to suggest disease progression. 6. Stable hypoenhancing splenic lesion. Attention on follow-up recommended. 7. Prostatomegaly. 8.  Aortic Atherosclerois (ICD10-170.0)     11/07/2019 - 11/21/2019 Radiation Therapy   Radiation to brain with Dr. Lisbeth Renshaw 11/07/19-11/21/19   11/28/2019 Imaging   CT CAP WO Contrast  IMPRESSION: 1. Interval progression of liver metastases. 2. Increase in size and number of right upper lobe pulmonary nodules. New right paratracheal adenopathy 3. Stable appearance of multifocal mixed lytic and sclerotic bone metastases. 4. Aortic atherosclerosis. Coronary artery calcifications noted.   Aortic Atherosclerosis (ICD10-I70.0).   12/04/2019 -  Chemotherapy   The patient had PACLitaxel (TAXOL) 144 mg in sodium chloride 0.9 % 250 mL chemo infusion (</= 80mg /m2), 80 mg/m2 = 144 mg, Intravenous,  Once, 1 of 4 cycles Administration: 144 mg (12/04/2019)  for chemotherapy treatment.      CURRENT THERAPY: Pending weekly taxol day 1, 8, 15 q28 days to start 12/04/19   INTERVAL HISTORY: Jeremy Mack returns for f/u and treatment as scheduled.  He was last seen on 11/26/2019, tecentriq was held for AKI, he was  given IVF. He underwent restaging CT on 12/31 that showed disease progression. He was also found to have AKI, TLS, encephalopathy and was admitted to the hospital. He was given IVF and rasburicase 6 mg. Discharged on 11/29/19. Today he presents by himself in a wheelchair. He is weak. His wife has to assist him with standing and all ADLs at home. Often voids in a urinal at home. He feels about the same since leaving the hospital. He is eating mostly soup and drinking water. Denies n/v/c/d, new pain, worsening cough, chest pain, dyspnea, edema, fever, chills, headaches, confusion.    MEDICAL HISTORY:  Past Medical History:  Diagnosis Date  . Cancer ([place])   . GERD (gastroesophageal reflux disease)   . Hyperlipemia   . Hypertension     SURGICAL HISTORY: Past Surgical History:  Procedure Laterality Date  . arm fracture surgery Right   . colon polyps removed    . ESOPHAGOGASTRODUODENOSCOPY (EGD) WITH PROPOFOL N/A 06/14/2018   Procedure: ESOPHAGOGASTRODUODENOSCOPY (EGD) WITH PROPOFOL;  Surgeon: [name], MD;  Location: [facility];  Service: Endoscopy;  Laterality: N/A;  . EUS N/A 06/14/2018   Procedure: ESOPHAGEAL ENDOSCOPIC ULTRASOUND (EUS) RADIAL;  Surgeon: [name], MD;  Location: [facility];  Service: Endoscopy;  Laterality: N/A;  . FINE NEEDLE ASPIRATION N/A 06/14/2018   Procedure: FINE NEEDLE ASPIRATION (FNA) LINEAR;  Surgeon: [name], MD;  Location: [facility];  Service: Endoscopy;  Laterality: N/A;  . IR IMAGING GUIDED PORT INSERTION  08/26/2019  . TONSILLECTOMY      I have reviewed the social history and family history with the patient and they are unchanged from previous note.  ALLERGIES:  has No Known Allergies.  MEDICATIONS:  Current Outpatient Medications  Medication Sig Dispense Refill  . allopurinol (ZYLOPRIM) 300 MG tablet  Take 1 tablet (300 mg total) by mouth 2 (two) times daily. 180 tablet 3  . amLODipine (NORVASC) 10 MG tablet Take 1 tablet (10  mg total) by mouth daily. 90 tablet 3  . apixaban (ELIQUIS) 5 MG TABS tablet Take 1 tablet (5 mg total) by mouth 2 (two) times daily. 60 tablet 3  . aspirin 325 MG tablet Take 650 mg by mouth every 4 (four) hours as needed for mild pain or headache.    . lidocaine-prilocaine (EMLA) cream Apply 1 application topically as needed. (Patient taking differently: Apply 1 application topically as needed (port access). ) 30 g 3  . nicotine (NICODERM CQ - DOSED IN MG/24 HOURS) 14 mg/24hr patch Place 1 patch (14 mg total) onto the skin daily. 28 patch 0  . Omega-3 Fatty Acids (FISH OIL) 1000 MG CAPS Take 1,000 mg by mouth daily.     . ondansetron (ZOFRAN) 8 MG tablet Take 1 tablet (8 mg total) by mouth 2 (two) times daily as needed for refractory nausea / vomiting. Start on day 3 after carboplatin chemo. 30 tablet 1  . pantoprazole (PROTONIX) 20 MG tablet Take 1 tablet (20 mg total) by mouth daily. 90 tablet 3  . polyvinyl alcohol (LIQUIFILM TEARS) 1.4 % ophthalmic solution Place 1 drop into both eyes as needed for dry eyes.    Marland Kitchen prochlorperazine (COMPAZINE) 10 MG tablet Take 1 tablet (10 mg total) by mouth every 6 (six) hours as needed (Nausea or vomiting). 30 tablet 1  . traMADol (ULTRAM) 50 MG tablet Take 1 tablet (50 mg total) by mouth every 6 (six) hours as needed for moderate pain. 60 tablet 0  . atorvastatin (LIPITOR) 40 MG tablet Take 1 tablet (40 mg total) by mouth daily at 6 PM. 90 tablet 3   No current facility-administered medications for this visit.    PHYSICAL EXAMINATION: ECOG PERFORMANCE STATUS: 2-3  Vitals:   12/04/19 1200  BP: 105/79  Pulse: (!) 120  Resp: 18  Temp: 98 F (36.7 C)  SpO2: 100%   Filed Weights    GENERAL:alert, no distress and comfortable SKIN: no rash  EYES:  sclera clear OROPHARYNX: white coating to tongue  LUNGS: distant, with normal breathing effort HEART: tachycardic, regular rhythm. No edema  ABDOMEN:abdomen soft, non-tender and normal bowel  sounds Musculoskeletal: no focal spinal tenderness  NEURO: alert & oriented x 3 with fluent speech, generalized weakness  PAC without erythema   LABORATORY DATA:  I have reviewed the data as listed CBC Latest Ref Rng & Units 12/04/2019 11/28/2019 11/28/2019  WBC 4.0 - 10.5 K/uL 10.2 10.2 9.3  Hemoglobin 13.0 - 17.0 g/dL 14.1 12.7(L) 14.7  Hematocrit 39.0 - 52.0 % 42.5 38.1(L) 42.8  Platelets 150 - 400 K/uL 131(L) 108(L) 131(L)     CMP Latest Ref Rng & Units 12/04/2019 11/29/2019 11/28/2019  Glucose 70 - 99 mg/dL 111(H) 100(H) 140(H)  BUN 8 - 23 mg/dL 19 20 31(H)  Creatinine 0.61 - 1.24 mg/dL 1.16 1.11 1.78(H)  Sodium 135 - 145 mmol/L 139 137 138  Potassium 3.5 - 5.1 mmol/L 4.3 3.9 4.5  Chloride 98 - 111 mmol/L 100 106 103  CO2 22 - 32 mmol/L 24 21(L) 20(L)  Calcium 8.9 - 10.3 mg/dL 11.2(H) 9.8 10.9(H)  Total Protein 6.5 - 8.1 g/dL 8.2(H) - 8.2(H)  Total Bilirubin 0.3 - 1.2 mg/dL 0.7 - 0.5  Alkaline Phos 38 - 126 U/L 336(H) - 262(H)  AST 15 - 41 U/L 87(H) - 91(H)  ALT 0 - 44 U/L 56(H) - 45(H)      RADIOGRAPHIC STUDIES: I have personally reviewed the radiological images as listed and agreed with the findings in the report. No results found.   ASSESSMENT & PLAN: Jeremy Mack is a 74 y.o. male with   1.Small Cell Lung Cancer,Right lung primary,Metastatic to liver,left lung, LNs andbone -He was diagnosed in 05/2019 with right Small cell lung cancer, TTF(+), metastatic to liver,upper abdominal nodal mets,bilateral lung, right mediastinal nodeswithdiffuse bone mets.initially brain MRI was negative in 06/14/19  -completed 4 cycles first line carbo/etoposide and tecentriq and continues maintenance single agent tecentriq -s/p brain radiation 11/07/19 - 11/21/19  -CT on 11/28/19 showed disease progression in liver, lungs, and nodal metastasis  -he will begin second line taxol today   2. Left abdominal pain -controlled with tramadol PRN, denies pain today   3. AKI, TLS,  secondary to disease progression  -hospitalized 11/28/19 - 11/29/19, given IV hydration and rasburicase 6 mg x1 -resolved   4.Multifocal small stroke -This was found on his brain MRI which was obtained before PCI, asymptomatic  -On Eliquis and baby aspirin daily   5. H/o pancreatic abscess  -imaging in 05/2018 suspicious for neoplasm, CA 19-9 was 1 -EUS and FNA per Dr. Ardis Hughs showed inflammation, no malignancy -stable  5. SmokingCessation,H/oalcohol use  -He reports a past history of alcohol use for 10-15 years, he does not drink now.CT in 05/2018 was concerning for hepatic cirrhosis -Has 30 year smoking history. He still smokes 1/2 ppd.  -tried nicotine patch in 05/2019   6. Enlarged prostate, elevated PSA  -PSA in 03/2019 9.47, up from 6.8 two years ago -enlarged prostate on CT, asymptomatic. -He was referred to urology by PCP  7.Goal of care discussion  -The patient understands the goal of care is palliative. -he is full code now  8. HTN  -on amlodipine, f/u PCP   9. Financial Support  -He notes $80 copay for Eliquis  -He has medicare and [employer]  10. Hypercalcemia of malignancy  -Corrected calcium is 11.7 today  Disposition:  Mr. Gautreau appears stable. Mental status improved after hospitalization, but PS remains low overall due to cancer progression. He relies heavily on his wife for assistance. He is a good candidate for palliative care at home and agrees to referral which I placed today. I encouraged him to eat and drink more, and try to increase mobility and strength safely; he agrees.   Labs reviewed, CBC stable. AKI resolved. No evidence of TLS today. He has hypercalcemia of malignancy, corrected ca 11.7. VSS except tachycardia. Will support with IVF today. He will proceed with cycle 1 day 1 taxol. Will give zometa 4 mg if hypercalcemia worsens to 12 or greater in the future. He will return for f/u next week with day 8. Mr. Arizola was seen with [name] today.      No problem-specific Assessment & Plan notes found for this encounter.   Orders Placed This Encounter  Procedures  . Uric acid    Standing Status:   Future    Number of Occurrences:   1    Standing Expiration Date:   12/03/2020  . Amb Referral to Palliative Care    Referral Priority:   Routine    Referral Type:   Consultation    Number of Visits Requested:   1   All questions were answered. The patient knows to call the clinic with any problems, questions or concerns. No barriers to learning was  detected.     Alla Feeling, NP 12/04/19   Addendum  I have seen the patient, examined him. I agree with the assessment and and plan and have edited the notes.   Pt is clinically stable, alert and oriented, but quite fatigued. Lab reviewed,  Uric acid WNL, will start second line chemo Taxol today. Chemo consent obtained, will proceed today. F/u next week.   Truitt Merle  12/04/2019

## 2019-12-04 NOTE — Progress Notes (Signed)
Okay to treat with AST of 87 per [name] NP

## 2019-12-04 NOTE — Progress Notes (Signed)
Okay to treat with heart rate of 114, per [name], NP.  [name] signed consent for Taxol.

## 2019-12-04 NOTE — Progress Notes (Signed)
Patient's wife could not be reached after multiple attempts to come pick patient.  Patient will be sent home via taxi. Message sent to [facility] to set up transportation for patient going forward.

## 2019-12-04 NOTE — Patient Instructions (Signed)
[facility] Discharge Instructions for Patients Receiving Chemotherapy  Today you received the following chemotherapy agents: Taxol.  To help prevent nausea and vomiting after your treatment, we encourage you to take your nausea medication as directed.   If you develop nausea and vomiting that is not controlled by your nausea medication, call the clinic.   BELOW ARE SYMPTOMS THAT SHOULD BE REPORTED IMMEDIATELY:  *FEVER GREATER THAN 100.5 F  *CHILLS WITH OR WITHOUT FEVER  NAUSEA AND VOMITING THAT IS NOT CONTROLLED WITH YOUR NAUSEA MEDICATION  *UNUSUAL SHORTNESS OF BREATH  *UNUSUAL BRUISING OR BLEEDING  TENDERNESS IN MOUTH AND THROAT WITH OR WITHOUT PRESENCE OF ULCERS  *URINARY PROBLEMS  *BOWEL PROBLEMS  UNUSUAL RASH Items with * indicate a potential emergency and should be followed up as soon as possible.  Feel free to call the clinic should you have any questions or concerns. The clinic phone number is [phone].  Please show the [employer] at check-in to the Emergency Department and triage nurse.  Paclitaxel injection What is this medicine? PACLITAXEL (PAK li TAX el) is a chemotherapy drug. It targets fast dividing cells, like cancer cells, and causes these cells to die. This medicine is used to treat ovarian cancer, breast cancer, lung cancer, Kaposi's sarcoma, and other cancers. This medicine may be used for other purposes; ask your health care provider or pharmacist if you have questions. COMMON BRAND NAME(S): Onxol, Taxol What should I tell my health care provider before I take this medicine? They need to know if you have any of these conditions:  history of irregular heartbeat  liver disease  low blood counts, like low white cell, platelet, or red cell counts  lung or breathing disease, like asthma  tingling of the fingers or toes, or other nerve disorder  an unusual or allergic reaction to paclitaxel, alcohol, polyoxyethylated castor  oil, other chemotherapy, other medicines, foods, dyes, or preservatives  pregnant or trying to get pregnant  breast-feeding How should I use this medicine? This drug is given as an infusion into a vein. It is administered in a hospital or clinic by a specially trained health care professional. Talk to your pediatrician regarding the use of this medicine in children. Special care may be needed. Overdosage: If you think you have taken too much of this medicine contact a poison control center or emergency room at once. NOTE: This medicine is only for you. Do not share this medicine with others. What if I miss a dose? It is important not to miss your dose. Call your doctor or health care professional if you are unable to keep an appointment. What may interact with this medicine? Do not take this medicine with any of the following medications:  disulfiram  metronidazole This medicine may also interact with the following medications:  antiviral medicines for hepatitis, HIV or AIDS  certain antibiotics like erythromycin and clarithromycin  certain medicines for fungal infections like ketoconazole and itraconazole  certain medicines for seizures like carbamazepine, phenobarbital, phenytoin  gemfibrozil  nefazodone  rifampin  St. John's wort This list may not describe all possible interactions. Give your health care provider a list of all the medicines, herbs, non-prescription drugs, or dietary supplements you use. Also tell them if you smoke, drink alcohol, or use illegal drugs. Some items may interact with your medicine. What should I watch for while using this medicine? Your condition will be monitored carefully while you are receiving this medicine. You will need important blood work  done while you are taking this medicine. This medicine can cause serious allergic reactions. To reduce your risk you will need to take other medicine(s) before treatment with this medicine. If you  experience allergic reactions like skin rash, itching or hives, swelling of the face, lips, or tongue, tell your doctor or health care professional right away. In some cases, you may be given additional medicines to help with side effects. Follow all directions for their use. This drug may make you feel generally unwell. This is not uncommon, as chemotherapy can affect healthy cells as well as cancer cells. Report any side effects. Continue your course of treatment even though you feel ill unless your doctor tells you to stop. Call your doctor or health care professional for advice if you get a fever, chills or sore throat, or other symptoms of a cold or flu. Do not treat yourself. This drug decreases your body's ability to fight infections. Try to avoid being around people who are sick. This medicine may increase your risk to bruise or bleed. Call your doctor or health care professional if you notice any unusual bleeding. Be careful brushing and flossing your teeth or using a toothpick because you may get an infection or bleed more easily. If you have any dental work done, tell your dentist you are receiving this medicine. Avoid taking products that contain aspirin, acetaminophen, ibuprofen, naproxen, or ketoprofen unless instructed by your doctor. These medicines may hide a fever. Do not become pregnant while taking this medicine. Women should inform their doctor if they wish to become pregnant or think they might be pregnant. There is a potential for serious side effects to an unborn child. Talk to your health care professional or pharmacist for more information. Do not breast-feed an infant while taking this medicine. Men are advised not to father a child while receiving this medicine. This product may contain alcohol. Ask your pharmacist or healthcare provider if this medicine contains alcohol. Be sure to tell all healthcare providers you are taking this medicine. Certain medicines, like metronidazole  and disulfiram, can cause an unpleasant reaction when taken with alcohol. The reaction includes flushing, headache, nausea, vomiting, sweating, and increased thirst. The reaction can last from 30 minutes to several hours. What side effects may I notice from receiving this medicine? Side effects that you should report to your doctor or health care professional as soon as possible:  allergic reactions like skin rash, itching or hives, swelling of the face, lips, or tongue  breathing problems  changes in vision  fast, irregular heartbeat  high or low blood pressure  mouth sores  pain, tingling, numbness in the hands or feet  signs of decreased platelets or bleeding - bruising, pinpoint red spots on the skin, black, tarry stools, blood in the urine  signs of decreased red blood cells - unusually weak or tired, feeling faint or lightheaded, falls  signs of infection - fever or chills, cough, sore throat, pain or difficulty passing urine  signs and symptoms of liver injury like dark yellow or brown urine; general ill feeling or flu-like symptoms; light-colored stools; loss of appetite; nausea; right upper belly pain; unusually weak or tired; yellowing of the eyes or skin  swelling of the ankles, feet, hands  unusually slow heartbeat Side effects that usually do not require medical attention (report to your doctor or health care professional if they continue or are bothersome):  diarrhea  hair loss  loss of appetite  muscle or joint pain  nausea, vomiting  pain, redness, or irritation at site where injected  tiredness This list may not describe all possible side effects. Call your doctor for medical advice about side effects. You may report side effects to FDA at 1-800-FDA-1088. Where should I keep my medicine? This drug is given in a hospital or clinic and will not be stored at home. NOTE: This sheet is a summary. It may not cover all possible information. If you have  questions about this medicine, talk to your doctor, pharmacist, or health care provider.  2020 Elsevier/Gold Standard (2017-07-18 13:14:55)

## 2019-12-05 ENCOUNTER — Telehealth: Payer: Self-pay

## 2019-12-05 ENCOUNTER — Telehealth: Payer: Self-pay | Admitting: Nurse Practitioner

## 2019-12-05 NOTE — Telephone Encounter (Signed)
I called patient's home and spoke to his wife Jeremy Mack regarding how the patient is doing today.  She states he got home fine last night.  Today he has laid in the bed all day.  When I asked had he eaten today, she stated no, when I asked can she wake him, she woke him up and put him on the phone.  I asked Jeremy Mack is he remembered how he got home last night and he states yes but could never tell me how.  He admitted to not eating all day today.  I asked him if there was someone I could call and was told they have no children.  I told him he needed to eat and drink and he states he will.  He put his wife back on the phone and I explained to her that the [place] was waiting for a long time last night for her to pick him up.  She states that no one called her and she states they have to call her on the home phone. She no longer has the mobile number which I have deleted.

## 2019-12-05 NOTE — Telephone Encounter (Signed)
Scheduled appt per 1/6 los.  Spoke with pt and he is aware of the appt date and time,.

## 2019-12-06 ENCOUNTER — Telehealth: Payer: Self-pay | Admitting: General Practice

## 2019-12-06 ENCOUNTER — Ambulatory Visit: Payer: Medicare (Managed Care)

## 2019-12-06 NOTE — Telephone Encounter (Signed)
Jeremy Mack Initial Psychosocial Assessment Clinical Social Work  Clinical Social Work contacted by phone to assess psychosocial, emotional, mental health, and spiritual needs of the patient.   Barriers to care/review of distress screen:  Transportation:  Do you anticipate any problems getting to appointments?  Do you have someone who can help run errands for you if you need it?  Wife transports to appointments but "they wont allow me to be in the appointments."  Feels disconnected from his care.  At last visit to [facility] infusion, [facility] was not able to reach wife to pick him up.  "By the time I got back to him, he had a major attitude, he does not like to wait."  Wife aware that "the other night they brought him home by [facility], but they didn't ever tell me he was ready to be picked up."  [employer] driver "rang the doorbell" after dark and brought patient home.  Wife states that [facility] didn't have correct phone number or "I never heard the phone ring."  Usually uses times he is at [facility] to catch up on things she cannot do when he is home and all her efforts are directed towards caregiving duties.  "I pretty much have to stand in front of him all the time"  Wife is willing to transport but would like clear indication of when to return to pick up patient.   Help at home:  What is your living situation (alone, family, other)?  If you are physically unable to care for yourself, who would you call on to help you?  Spoke w wife, Jeremy Mack, "I am wore completely out.  He has cancer and has had a stroke, cannot hold his water, has just about stopped eating." "This is stressing me now, trying to beg him to eat."  Wants dietitian referral.   Has difficulty w ADLs at home.  Feels patient does not have a lot of energy, "he doesn't want to get up."  Can only sit up a "few minutes."  Sits in computer chair at home, "rolls around for a new minutes" but usually "lays down."  Does not use toilet by himself - uses urinals.  "I don't think he has  had a bowel movement in 2 - 3 days."  Cannot shower on his own - "he will not take a shower."  Overall, caregiving burden on wife is substantial and she is emotionally and physically exhausted.   Support system:  What does your support system look like?  Who would you call on if you needed some kind of practical help?  What if you needed someone to talk to for emotional support?  Youngest son died in 2019-10-23 at age 50.  Has 3 living children - two girls and eldest son.  Live nearby in [place] - "dont help out much, they just come to say hi/bye."  Has been together w Jeremy Mack approx 40 years.  Wife has difficulty sleeping because she is afraid to sleep because patient is asking for drinks, urinal and similar when he is asleep at night.   Finances:  Are you concerned about finances.  Considering returning to work?  If not, applying for disability?  Has Medicare only, applied for Medicaid but were over income.  Has no options for personal care.  services as he does not have Medicaid.    What is your understanding of where you are with your cancer? Its cause?  Your treatment plan and what happens next?  Stage IV cancer,  in current treatment.  Wife reluctant to consider hospice, does not want him to stop treatments.  However, patient is declining.    CSW Summary:  Patient and family psychosocial functioning including strengths, limitations, and coping skills:  Patient has Stage IV lung cancer, currently in treatment.  Per wife, he has had a stroke in the past and she wonders if this is impacting his function also. Wife feels he is in complete control of his mental faculties but physically "he has been going downhill a little at a time since he buried his only son in November."  Feels he hasnt been "right" since then.  Can become irritated with others very easily.  Spouse says that he is "going downhill fast" both mentally and physically.  Feels decline started following death of youngest son.  Wife is  struggling with caregiving demands - physical and mental strain is significant.  Little support available from family - no options for caregiving support other than private pay due to lack of Medicaid.  Children make brief visits but do not assist w errands, caregiving or other needs.  Wife is sole caregiver.  Patient aware that he "needs more help."  Family does not know where to turn to get help.    Identifications of barriers to care: No resources for in home aide.  No current home health provider.  Does have [facility] RN CM Dionne Leath who is in contact with family.  PCP is [facility] on [employer].    Availability of community resources:  Limited availability of community resources for in home caregiving aid.  Can ask for referral for home health PT and aide (for shower and similar) if possible.  Discussed option of [facility] if patient is physically able to participate in SNF type day program.  Wife will benefit from any relief from 24/7 caregiving responsibilities.  Patient also aware that he may have depression secondary to recent death of son and grieving.  Is aware of impact of grief and admits that this has impacted his energy, mood, sleep, appetite.  May need to contact PCP to help w antidepressants and/or referral to other options for grief support if appropriate.    Clinical Social Worker follow up needed: Yes.  Ask oncologist to refer for home health, collaborate w [facility] RN CM, call in one week to assess progress.  Wife feels uninformed about his care and needs - would like to be included in oncology visits, at least by phone.  Would like to know estimated time he needs to be picked up, is willing to transport as needed.    Edwyna Shell, LCSW Clinical Social Worker Phone:  [phone] Cell:  [phone]

## 2019-12-09 ENCOUNTER — Telehealth: Payer: Self-pay

## 2019-12-09 ENCOUNTER — Other Ambulatory Visit: Payer: Self-pay | Admitting: *Deleted

## 2019-12-09 ENCOUNTER — Ambulatory Visit: Payer: Self-pay

## 2019-12-09 NOTE — Progress Notes (Signed)
[place]   [phone] [phone]   Clinic Follow up Note   Patient Care Team: Jeremy Mack, [place] as PCP - General (Internal Medicine) Jeremy Mack, Dawn, RN (Inactive) as Oncology Nurse Navigator Jeremy Billings, RN as [place] [facility] Jeremy Mack, Angelia Mould, Jeremy Mack (Pharmacist) Jeremy Rudd, MD as Consulting Physician (Radiation Oncology)  Date of Service:  12/11/2019  CHIEF COMPLAINT: Metastatic Small Cell Lung Cancer  SUMMARY OF ONCOLOGIC HISTORY: Oncology History Overview Note  Cancer Staging Small cell lung cancer ([place]) Staging form: Lung, AJCC 8th Edition - Clinical: Stage IVB (cTX, cN2, pM1c) - Signed by Jeremy Merle, MD on 06/06/2019    Primary malignant neoplasm of bronchus of left upper lobe ([place])  07/11/2018 Imaging   CT AP 07/11/18 IMPRESSION: 1. Significantly improved appearance of the pancreas and peripancreatic tissues. Improved to resolved peripancreatic edema with resolved and significantly improved peripancreatic lesions. The remaining lesion was felt to represent abscess on prior endoscopic ultrasound sampling. Pseudocyst or infected pseudocyst would be the favored imaging diagnosis. 2. Persistent but improved mass effect upon the inferior aspect of the portal vein and superior aspect of the superior mesenteric vein. 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 3. Aortic Atherosclerosis (ICD10-I70.0). Bilateral common iliac artery ectasia. 4. A left adrenal nodule is indeterminate on precontrast imaging and may have decreased in size since the prior. Consider dedicated adrenal protocol CT. This could either be performed in 6 months to confirm size stability or more acutely, depending on clinical concern. 5. A splenic lesion is indeterminate and can be re-evaluated on follow-up. 6. Hepatic morphology suspicious for cirrhosis. 7. Cholelithiasis. 8.  Emphysema (ICD10-J43.9).     05/01/2019 Imaging   CT AP 05/01/19  IMPRESSION: 1. New diffuse liver metastases. 2. New mild retroperitoneal lymphadenopathy, consistent with metastatic disease. 3. Resolution of previously seen pancreatic mass since previous study. No radiographic evidence of acute pancreatitis. 4. Stable left adrenal mass, consistent with adrenal adenoma. 5. Stable markedly enlarged prostate and findings of chronic bladder outlet obstruction. 6. Colonic diverticulosis. No radiographic evidence of diverticulitis. 7. Cholelithiasis.   Aortic Atherosclerosis (ICD10-I70.0).   05/28/2019 PET scan   PET 05/28/19  IMPRESSION: 1. Examination is positive for widespread FDG avid liver metastases. Liver lesions are too numerous to count. 2. Hypermetabolic mediastinal and upper abdominal nodal metastases. 3. Left upper lobe and right upper lobe hypermetabolic pulmonary nodules consistent with pulmonary metastasis. 4. Innumerable liver metastases noted within the axial and proximal appendicular skeleton. Bone lesions are too numerous to count. Some of these have a corresponding lytic lesion on CT images. Others appear occult on the corresponding CT images. 5. Solid hyperdense left adrenal nodule is not significantly changed from 05/01/2019 and exhibits mild FDG uptake. Indeterminate. 6. Prostate gland enlargement. Focal area of increased uptake within the left posterior gland is indeterminate.   06/04/2019 Initial Biopsy   Biopsy 06/04/19  Diagnosis Liver, needle/core biopsy - SMALL CELL CARCINOMA. SEE COMMENT.   06/06/2019 Initial Diagnosis   Small cell lung cancer ([place])   06/06/2019 Cancer Staging   Staging form: Lung, AJCC 8th Edition - Clinical: Stage IVB (cTX, cN2, pM1c) - Signed by Jeremy Merle, MD on 06/06/2019   06/10/2019 - 11/04/2019 Chemotherapy   Carboplatin Day 1 and Etopside day 1-3 with immunotherapy atezolizumab (Tecentriq) on day 1, every 3 weeks for 6 cycles starting 06/10/19. He is now on  maintenance Tecentriq every 3 weeks. Stopped after 11/04/19 due to disease progression.    06/14/2019 Imaging  MRI brain 06/14/19  IMPRESSION: No brain metastases. Chronic small-vessel ischemic changes of the pons and cerebral hemispheric white matter.   Probable metastatic lesions within the upper cervical spine as seen. No sign of spinal canal encroachment.   07/30/2019 Imaging   CT CAP W Contrast  IMPRESSION: 1. Marked interval decrease in size of the hypermetabolic bilateral pulmonary nodules seen on previous PET-CT. 2. Clear interval decrease in the numerous hepatic metastases. 3. Interval resolution of mediastinal and upper abdominal lymphadenopathy seen previously. 4. Stable left adrenal nodule. This shows low level FDG uptake on prior PET-CT and is indeterminate. Continued attention on follow-up recommended. 5. No new or progressive findings to suggest disease progression. 6. Stable hypoenhancing splenic lesion. Attention on follow-up recommended. 7. Prostatomegaly. 8.  Aortic Atherosclerois (ICD10-170.0)     11/07/2019 - 11/21/2019 Radiation Therapy   Radiation to brain with Dr. Lisbeth Mack 11/07/19-11/21/19   11/28/2019 Imaging   CT CAP WO Contrast  IMPRESSION: 1. Interval progression of liver metastases. 2. Increase in size and number of right upper lobe pulmonary nodules. New right paratracheal adenopathy 3. Stable appearance of multifocal mixed lytic and sclerotic bone metastases. 4. Aortic atherosclerosis. Coronary artery calcifications noted.   Aortic Atherosclerosis (ICD10-I70.0).   12/04/2019 - 12/04/2019 Chemotherapy   Second-line Taxol starting 12/04/19. Only received 1 dose, will proceed with hospice.       CURRENT THERAPY:  Supportive care   INTERVAL HISTORY:  Jeremy Mack is here for a follow up and treatment. He presents to the clinic with his wife. He notes he was too weak to weight himself today. His wife notes he mostly lays down and sleeps. He notes he  is not eating as much at home. He has nausea. He feels he need to do more liquids. He notes having no appetite as he feels he cannot swallow. He denies any pain or dizziness. I reviewed his medication list with him. He mainly take antiemetics, Protonix, HTN meds.     REVIEW OF SYSTEMS:   Constitutional: Denies fevers, chills (+) Low appetite, weight loss (+) Fatigue/weakness  Eyes: Denies blurriness of vision Ears, nose, mouth, throat, and face: Denies mucositis or sore throat (+) Dysphagia  Respiratory: Denies cough, dyspnea or wheezes  Cardiovascular: Denies palpitation, chest discomfort or lower extremity swelling Gastrointestinal:  Denies heartburn or change in bowel habits (+) Nausea  Skin: Denies abnormal skin rashes Lymphatics: Denies new lymphadenopathy or easy bruising Neurological:Denies numbness, tingling or new weaknesses Behavioral/Psych: Mood is stable, no new changes  All other systems were reviewed with the patient and are negative.  MEDICAL HISTORY:  Past Medical History:  Diagnosis Date  . Cancer ([place])   . GERD (gastroesophageal reflux disease)   . Hyperlipemia   . Hypertension     SURGICAL HISTORY: Past Surgical History:  Procedure Laterality Date  . arm fracture surgery Right   . colon polyps removed    . ESOPHAGOGASTRODUODENOSCOPY (EGD) WITH PROPOFOL N/A 06/14/2018   Procedure: ESOPHAGOGASTRODUODENOSCOPY (EGD) WITH PROPOFOL;  Surgeon: [name], MD;  Location: [facility] ENDOSCOPY;  Service: Endoscopy;  Laterality: N/A;  . EUS N/A 06/14/2018   Procedure: ESOPHAGEAL ENDOSCOPIC ULTRASOUND (EUS) RADIAL;  Surgeon: [name], MD;  Location: [facility] ENDOSCOPY;  Service: Endoscopy;  Laterality: N/A;  . FINE NEEDLE ASPIRATION N/A 06/14/2018   Procedure: FINE NEEDLE ASPIRATION (FNA) LINEAR;  Surgeon: [name], MD;  Location: [facility] ENDOSCOPY;  Service: Endoscopy;  Laterality: N/A;  . IR IMAGING GUIDED PORT INSERTION  08/26/2019  . TONSILLECTOMY  I have  reviewed the social history and family history with the patient and they are unchanged from previous note.  ALLERGIES:  has No Known Allergies.  MEDICATIONS:  Current Outpatient Medications  Medication Sig Dispense Refill  . allopurinol (ZYLOPRIM) 300 MG tablet Take 1 tablet (300 mg total) by mouth 2 (two) times daily. (Patient not taking: Reported on 12/11/2019) 180 tablet 3  . amLODipine (NORVASC) 10 MG tablet Take 1 tablet (10 mg total) by mouth daily. (Patient not taking: Reported on 12/11/2019) 90 tablet 3  . apixaban (ELIQUIS) 5 MG TABS tablet Take 1 tablet (5 mg total) by mouth 2 (two) times daily. (Patient not taking: Reported on 12/11/2019) 60 tablet 3  . aspirin 325 MG tablet Take 650 mg by mouth every 4 (four) hours as needed for mild pain or headache.    Marland Kitchen atorvastatin (LIPITOR) 40 MG tablet Take 1 tablet (40 mg total) by mouth daily at 6 PM. 90 tablet 3  . lidocaine-prilocaine (EMLA) cream Apply 1 application topically as needed. (Patient not taking: Reported on 12/11/2019) 30 g 3  . megestrol (MEGACE ES) 625 MG/5ML suspension Take 5 mLs (625 mg total) by mouth daily. 150 mL 1  . nicotine (NICODERM CQ - DOSED IN MG/24 HOURS) 14 mg/24hr patch Place 1 patch (14 mg total) onto the skin daily. (Patient not taking: Reported on 12/11/2019) 28 patch 0  . Omega-3 Fatty Acids (FISH OIL) 1000 MG CAPS Take 1,000 mg by mouth daily.     . ondansetron (ZOFRAN) 8 MG tablet Take 1 tablet (8 mg total) by mouth 2 (two) times daily as needed for refractory nausea / vomiting. Start on day 3 after carboplatin chemo. (Patient not taking: Reported on 12/11/2019) 30 tablet 1  . pantoprazole (PROTONIX) 20 MG tablet Take 1 tablet (20 mg total) by mouth daily. (Patient not taking: Reported on 12/11/2019) 90 tablet 3  . polyvinyl alcohol (LIQUIFILM TEARS) 1.4 % ophthalmic solution Place 1 drop into both eyes as needed for dry eyes.    Marland Kitchen prochlorperazine (COMPAZINE) 10 MG tablet Take 1 tablet (10 mg total) by mouth  every 6 (six) hours as needed (Nausea or vomiting). (Patient not taking: Reported on 12/11/2019) 30 tablet 1  . traMADol (ULTRAM) 50 MG tablet Take 1 tablet (50 mg total) by mouth every 6 (six) hours as needed for moderate pain. (Patient not taking: Reported on 12/11/2019) 60 tablet 0   No current facility-administered medications for this visit.   Facility-Administered Medications Ordered in Other Visits  Medication Dose Route Frequency Provider Last Rate Last Admin  . 0.9 %  sodium chloride infusion   Intravenous Once [name] K, NP      . heparin lock flush 100 unit/mL  500 Units Intracatheter Once PRN [name] K, NP      . sodium chloride flush (NS) 0.9 % injection 10 mL  10 mL Intracatheter Once PRN Alla Feeling, NP        PHYSICAL EXAMINATION: ECOG PERFORMANCE STATUS: 3 - Symptomatic, >50% confined to bed  Vitals:   12/11/19 1241  BP: 107/77  Pulse: (!) 121  Resp: 18  Temp: 98.5 F (36.9 C)  SpO2: 100%   Filed Weights    Due to COVID19 we will limit examination to appearance. Patient had no complaints.  GENERAL:alert, no distress and comfortable SKIN: skin color normal, no rashes or significant lesions EYES: normal, Conjunctiva are pink and non-injected, sclera clear  NEURO: alert & oriented x 3 with fluent  speech   LABORATORY DATA:  I have reviewed the data as listed CBC Latest Ref Rng & Units 12/11/2019 12/04/2019 11/28/2019  WBC 4.0 - 10.5 K/uL 4.5 10.2 10.2  Hemoglobin 13.0 - 17.0 g/dL 12.8(L) 14.1 12.7(L)  Hematocrit 39.0 - 52.0 % 38.2(L) 42.5 38.1(L)  Platelets 150 - 400 K/uL 77(L) 131(L) 108(L)     CMP Latest Ref Rng & Units 12/11/2019 12/04/2019 11/29/2019  Glucose 70 - 99 mg/dL 121(H) 111(H) 100(H)  BUN 8 - 23 mg/dL 50(H) 19 20  Creatinine 0.61 - 1.24 mg/dL 1.78(H) 1.16 1.11  Sodium 135 - 145 mmol/L 139 139 137  Potassium 3.5 - 5.1 mmol/L 4.8 4.3 3.9  Chloride 98 - 111 mmol/L 104 100 106  CO2 22 - 32 mmol/L 18(L) 24 21(L)  Calcium 8.9 - 10.3 mg/dL  10.0 11.2(H) 9.8  Total Protein 6.5 - 8.1 g/dL 8.0 8.2(H) -  Total Bilirubin 0.3 - 1.2 mg/dL 0.7 0.7 -  Alkaline Phos 38 - 126 U/L 316(H) 336(H) -  AST 15 - 41 U/L 101(H) 87(H) -  ALT 0 - 44 U/L 50(H) 56(H) -      RADIOGRAPHIC STUDIES: I have personally reviewed the radiological images as listed and agreed with the findings in the report. No results found.   ASSESSMENT & PLAN:  Jeremy Mack is a 74 y.o. male with    1.Small Cell Lung Cancer,Right lung primary,Metastatic to liver,left lung, LNs andbone -He was diagnosed in 05/2019 with right Small cell lung cancer, TTF(+), metastatic to liver,upper abdominal nodal mets,bilateral lung, right mediastinal nodeswithdiffuse bone mets. -No brain metastasis as seen on 06/14/19 brain MRI.  -Wepreviouslydiscussed given his stage IV metastatic disease, his cancer is no longer curable but still treatable. -He has completed4 cycles offirstlineCarboplatin etopside andimmunotherapyTecentriq and completed brain radiation 11/07/19-11/21/19.He continued maintenance therapy with Rayne Du.  -Unfortunately his CT on 11/28/19 showed disease progression in liver, lungs, and nodal metastasis.  -I started him on second line Taxol on 12/04/19. He has only completed 1 dose.  -he continues to declined rapidly in the last few weeks. Given his very poor performance status from low appetite, nausea, dysaphia, very low oral intake, weight loss and weakness, I recommend stopping cancer treatment altogether and proceeding with hospice home care to manage his quality of life. I discussed treatment will likely not be tolerable or beneficial in his current condition.  I discussed the hospice service in detail.  Both patient and his wife voiced good understanding and aggress to proceed with hospice care. I will send referral.  -I will remain to be his MD and continue to manage his care and medications. F/u only as needed    2. Poor appetite and oral  intake, weight loss, weakness, nausea  -I will given IV fluids, antiemetics today  -I will call in Megace today (12/11/19) to help his appetite. I encouraged him to try to eat and drink more.   3. Left abdominal pain -controlled with tramadol PRN, denies pain today   4.Multifocal small stroke -This was found on his brain MRI which was obtained before PCI -He was asymptomatic -On Eliquis. He is still on baby aspirin daily.  5. H/o pancreatic abscess  -imaging in 05/2018 suspicious for neoplasm, CA 19-9 was 1 -EUS and FNA per Dr. Ardis Hughs showed inflammation, no malignancy -stable  6. SmokingCessation,H/oalcohol use  -He reports a past history of alcohol use for 10-15 years, he does not drink now.CT in 05/2018 was concerning for hepatic cirrhosis -Has 30 year  smoking history. He still smokes 1/2 ppd. Ipreviouslydiscussed smoking cessation and previously called in nicotinepatch in 05/2019.  7. Enlarged prostate, elevated PSA  -PSA in 03/2019 9.47, up from 6.8 two years ago -enlarged prostate on CT, asymptomatic. -He was referred to urology by PCP  8.Goal of care discussion  -The patient understands the goal of care is palliative. -He will be proceeding with hospice given his poor performance status.   9. HTN  -On amlodipine. Lately BP range has been high (152/100 on 11/04/19)  -I recommend he see PCP about medication adjustment to better control HTN.   10. Hypercalcemia  -resolved today after chemo last week    PLAN: -Stop chemo due to his extremely poor PS  -Send Hospice home care referral today  -F/u as needed, I will remain to be his MD when he is under hospice care    No problem-specific Assessment & Plan notes found for this encounter.   Orders Placed This Encounter  Procedures  . Ambulatory referral to Hospice    Referral Priority:   Urgent    Referral Type:   Consultation    Referral Reason:   Specialty Services Required    Requested Specialty:    Hospice Services    Number of Visits Requested:   1   All questions were answered. The patient knows to call the clinic with any problems, questions or concerns. No barriers to learning was detected. The total time spent in the appointment was 30 minutes.     Jeremy Merle, MD 12/11/2019   I, Joslyn Devon, am acting as scribe for Jeremy Merle, MD.   I have reviewed the above documentation for accuracy and completeness, and I agree with the above.

## 2019-12-09 NOTE — Telephone Encounter (Signed)
Phone call placed to patient's wife to introduce Palliative Care and to offer to schedule visit with NP. VM left with call back information.

## 2019-12-09 NOTE — Patient Outreach (Signed)
[facility]) Care Management  12/09/2019  Jeremy Mack 1945/12/04 [id number]   Subjective: Unsuccessful outreach call to patient at preferred home number, no answer,able to leave a HIPAA compliant message for return call.    Plan Will await return call if no response will plan return call in the next 4 business days.      Joylene Draft, RN, [place] Management Coordinator  [phone]- Mobile [phone]- Toll Free Main Office

## 2019-12-10 ENCOUNTER — Telehealth: Payer: Self-pay

## 2019-12-10 NOTE — Telephone Encounter (Signed)
Spoke with Enid Derry NP regarding schedule. Phone call returned to patient's wife. VM left offering to schedule visit on 12/11/19 @ 10:30am

## 2019-12-10 NOTE — Telephone Encounter (Signed)
Phone call placed to patient's wife to schedule visit with Palliative care. Offered to schedule visit for next week. Patient's wife requested visit earlier. Will speak with Enid Derry NP and return call to patient's wife.

## 2019-12-11 ENCOUNTER — Telehealth: Payer: Self-pay

## 2019-12-11 ENCOUNTER — Encounter: Payer: Self-pay | Admitting: Hematology

## 2019-12-11 ENCOUNTER — Telehealth: Payer: Self-pay | Admitting: Hematology

## 2019-12-11 ENCOUNTER — Inpatient Hospital Stay (HOSPITAL_BASED_OUTPATIENT_CLINIC_OR_DEPARTMENT_OTHER): Payer: Medicare (Managed Care) | Admitting: Hematology

## 2019-12-11 ENCOUNTER — Inpatient Hospital Stay: Payer: Medicare (Managed Care)

## 2019-12-11 ENCOUNTER — Other Ambulatory Visit: Payer: Self-pay

## 2019-12-11 ENCOUNTER — Telehealth: Payer: Self-pay | Admitting: Radiation Oncology

## 2019-12-11 VITALS — BP 107/77 | HR 121 | Temp 98.5°F | Resp 18 | Ht 71.0 in

## 2019-12-11 DIAGNOSIS — C3490 Malignant neoplasm of unspecified part of unspecified bronchus or lung: Secondary | ICD-10-CM

## 2019-12-11 DIAGNOSIS — C3412 Malignant neoplasm of upper lobe, left bronchus or lung: Secondary | ICD-10-CM

## 2019-12-11 DIAGNOSIS — Z95828 Presence of other vascular implants and grafts: Secondary | ICD-10-CM

## 2019-12-11 LAB — CMP (CANCER CENTER ONLY)
ALT: 50 U/L — ABNORMAL HIGH (ref 0–44)
AST: 101 U/L — ABNORMAL HIGH (ref 15–41)
Albumin: 3.5 g/dL (ref 3.5–5.0)
Alkaline Phosphatase: 316 U/L — ABNORMAL HIGH (ref 38–126)
Anion gap: 17 — ABNORMAL HIGH (ref 5–15)
BUN: 50 mg/dL — ABNORMAL HIGH (ref 8–23)
CO2: 18 mmol/L — ABNORMAL LOW (ref 22–32)
Calcium: 10 mg/dL (ref 8.9–10.3)
Chloride: 104 mmol/L (ref 98–111)
Creatinine: 1.78 mg/dL — ABNORMAL HIGH (ref 0.61–1.24)
GFR, Est AFR Am: 44 mL/min — ABNORMAL LOW (ref >60)
GFR, Estimated: 38 mL/min — ABNORMAL LOW (ref >60)
Glucose, Bld: 121 mg/dL — ABNORMAL HIGH (ref 70–99)
Potassium: 4.8 mmol/L (ref 3.5–5.1)
Sodium: 139 mmol/L (ref 135–145)
Total Bilirubin: 0.7 mg/dL (ref 0.3–1.2)
Total Protein: 8 g/dL (ref 6.5–8.1)

## 2019-12-11 LAB — CBC WITH DIFFERENTIAL (CANCER CENTER ONLY)
Abs Immature Granulocytes: 0.02 10*3/uL (ref 0.00–0.07)
Basophils Absolute: 0 10*3/uL (ref 0.0–0.1)
Basophils Relative: 0 %
Eosinophils Absolute: 0 10*3/uL (ref 0.0–0.5)
Eosinophils Relative: 0 %
HCT: 38.2 % — ABNORMAL LOW (ref 39.0–52.0)
Hemoglobin: 12.8 g/dL — ABNORMAL LOW (ref 13.0–17.0)
Immature Granulocytes: 0 %
Lymphocytes Relative: 33 %
Lymphs Abs: 1.5 10*3/uL (ref 0.7–4.0)
MCH: 29.3 pg (ref 26.0–34.0)
MCHC: 33.5 g/dL (ref 30.0–36.0)
MCV: 87.4 fL (ref 80.0–100.0)
Monocytes Absolute: 0.3 10*3/uL (ref 0.1–1.0)
Monocytes Relative: 7 %
Neutro Abs: 2.7 10*3/uL (ref 1.7–7.7)
Neutrophils Relative %: 60 %
Platelet Count: 77 10*3/uL — ABNORMAL LOW (ref 150–400)
RBC: 4.37 MIL/uL (ref 4.22–5.81)
RDW: 14.8 % (ref 11.5–15.5)
WBC Count: 4.5 10*3/uL (ref 4.0–10.5)
nRBC: 1.1 % — ABNORMAL HIGH (ref 0.0–0.2)

## 2019-12-11 MED ORDER — MEGESTROL ACETATE 625 MG/5ML PO SUSP: 625.0000 mg | Freq: Every day | ORAL | 1 refills | Status: AC

## 2019-12-11 MED ORDER — HEPARIN SOD (PORK) LOCK FLUSH 100 UNIT/ML IV SOLN
500.0000 [IU] | Freq: Once | INTRAVENOUS | Status: AC | PRN
  Administered 2019-12-11: 500 [IU]
  Filled 2019-12-11: qty 5

## 2019-12-11 MED ORDER — SODIUM CHLORIDE 0.9% FLUSH
10.0000 mL | Freq: Once | INTRAVENOUS | Status: AC
  Administered 2019-12-11: 10 mL via INTRAVENOUS
  Filled 2019-12-11: qty 10

## 2019-12-11 MED ORDER — SODIUM CHLORIDE 0.9 % IV SOLN
Freq: Once | INTRAVENOUS | Status: AC
  Filled 2019-12-11: qty 250

## 2019-12-11 MED ORDER — SODIUM CHLORIDE 0.9 % IV SOLN
Freq: Once | INTRAVENOUS | Status: DC
  Filled 2019-12-11: qty 250

## 2019-12-11 MED ORDER — SODIUM CHLORIDE 0.9% FLUSH
10.0000 mL | Freq: Once | INTRAVENOUS | Status: AC | PRN
  Administered 2019-12-11: 10 mL
  Filled 2019-12-11: qty 10

## 2019-12-11 NOTE — Telephone Encounter (Signed)
Referral, last ov, demographics sheet, and copy of insurance card faxed to [facility] at [phone].

## 2019-12-11 NOTE — Telephone Encounter (Signed)
  Radiation Oncology         [phone] ________________________________  Name: Jeremy Mack MRN: [id number]  Date of Service: 12/11/2019  DOB: 01-05-1946  Post Treatment Telephone Note  Diagnosis:  Extensive stage small cell carcinoma of the lung with lung, liver, bone, and lymph node involvement with significant response to systemic therapy  Interval Since Last Radiation:  3 weeks   11/07/2019-11/21/2019 PCI: The whole brain was treated to 25 Gy in 10 fractions   Narrative:  The patient was contacted today for routine follow-up. During treatment he did very well with radiotherapy and did not have significant desquamation. He tolerated the treatment well but his performance status has declined and he will be proceeding with hospice.   Impression/Plan: 1. Extensive stage small cell carcinoma of the lung with lung, liver, bone, and lymph node involvement with significant response to systemic therapy. The patient had tolerated treatment but because of his poor performance, he has elected to proceed with hospice. I spoke with his wife today and she said they hadn't heard yet from hospice care. I left a message for Dr. Ernestina Penna nurse so they are aware also. We will plan to follow up with him as needed as he plans to proceed with hospice.     Carola Rhine, PAC

## 2019-12-11 NOTE — Patient Instructions (Signed)

## 2019-12-11 NOTE — Addendum Note (Signed)
Addended by: Truitt Merle on: 12/11/2019 01:29 PM   Modules accepted: Orders

## 2019-12-11 NOTE — Telephone Encounter (Signed)
Cancelled appts per 1/13 los.

## 2019-12-12 ENCOUNTER — Telehealth: Payer: Self-pay | Admitting: Radiation Oncology

## 2019-12-12 ENCOUNTER — Other Ambulatory Visit: Payer: Self-pay | Admitting: *Deleted

## 2019-12-12 ENCOUNTER — Telehealth: Payer: Self-pay

## 2019-12-12 NOTE — Telephone Encounter (Addendum)
Checked Blenda Nicely, RN's voicemail today in her absence. Discovered six voice mails from the wife and daughter of this patient desperately requesting hospice care and support from this office. Noted that [name] placed a hospice referral yesterday. Phoned wife, [name], to offer my assistance. There was no answer on her mobile device and no option to leave a message. Phoned their home. No answer but I was able to leave a detailed message with my direct contact number.   Diagnosis:  Extensive stage small cell carcinoma of the lung with lung, liver, bone, and lymph node involvementwith significant response to systemic therapy  Interval Since Last Radiation:  3 weeks   11/07/2019-11/21/2019 PCI: The whole brain was treated to 25 Gy in 10 fractions   Impression/Plan per Shona Simpson, PA-C: 1.         Extensive stage small cell carcinoma of the lung with lung, liver, bone, and lymph node involvementwith significant response to systemic therapy. The patient had tolerated treatment but because of his poor performance, he has elected to proceed with hospice. I spoke with his wife today and she said they hadn't heard yet from hospice care. I left a message for Dr. Ernestina Penna nurse so they are aware also. We will plan to follow up with him as needed as he plans to proceed with hospice.

## 2019-12-12 NOTE — Telephone Encounter (Signed)
Communicated with Delsa Sale with  Hospice Referral center to check status on Hospice admission. Per Delsa Sale, numerous calls made to both home and cell numbers, without return call. Will reach out to Cancer center for alternative contacts.

## 2019-12-12 NOTE — Patient Outreach (Signed)
[facility]) Care Management  12/12/2019  Desmond Szabo 04/17/1946 [id number]  Telephone assessment   Outreach attempt #2 Unsuccessful outreach call to patient at preferred home number, no answer,able to leave a HIPAA compliant message for return call.    Plan Will await return call if no response will plan return call in the next 4 business days Noted patient now has orders for Hospice referral and they are  making outreach calls to patient home to follow up for  hospice admission.   Will update assigned RNCM.    Joylene Draft, RN, [place] Management Coordinator  [phone]- Mobile [phone]- Toll Free Main Office

## 2019-12-13 ENCOUNTER — Inpatient Hospital Stay: Payer: Medicare (Managed Care) | Admitting: General Practice

## 2019-12-13 ENCOUNTER — Telehealth: Payer: Self-pay

## 2019-12-13 ENCOUNTER — Encounter (HOSPITAL_COMMUNITY): Payer: Self-pay

## 2019-12-13 ENCOUNTER — Inpatient Hospital Stay (HOSPITAL_COMMUNITY)
Admission: EM | Admit: 2019-12-13 | Discharge: 2019-12-19 | DRG: 180 | Disposition: A | Discharge status: Home Health | Payer: Medicare (Managed Care) | Attending: Hematology | Admitting: Hematology

## 2019-12-13 ENCOUNTER — Other Ambulatory Visit: Payer: Self-pay

## 2019-12-13 ENCOUNTER — Other Ambulatory Visit: Payer: Self-pay | Admitting: *Deleted

## 2019-12-13 ENCOUNTER — Encounter: Payer: Self-pay | Admitting: General Practice

## 2019-12-13 DIAGNOSIS — Z923 Personal history of irradiation: Secondary | ICD-10-CM

## 2019-12-13 DIAGNOSIS — D61818 Other pancytopenia: Secondary | ICD-10-CM | POA: Diagnosis present

## 2019-12-13 DIAGNOSIS — Z8673 Personal history of transient ischemic attack (TIA), and cerebral infarction without residual deficits: Secondary | ICD-10-CM | POA: Diagnosis not present

## 2019-12-13 DIAGNOSIS — Z87448 Personal history of other diseases of urinary system: Secondary | ICD-10-CM

## 2019-12-13 DIAGNOSIS — Z9181 History of falling: Secondary | ICD-10-CM

## 2019-12-13 DIAGNOSIS — C3491 Malignant neoplasm of unspecified part of right bronchus or lung: Secondary | ICD-10-CM

## 2019-12-13 DIAGNOSIS — Z7982 Long term (current) use of aspirin: Secondary | ICD-10-CM | POA: Diagnosis not present

## 2019-12-13 DIAGNOSIS — Z681 Body mass index (BMI) 19 or less, adult: Secondary | ICD-10-CM

## 2019-12-13 DIAGNOSIS — C7951 Secondary malignant neoplasm of bone: Secondary | ICD-10-CM | POA: Diagnosis present

## 2019-12-13 DIAGNOSIS — F17290 Nicotine dependence, other tobacco product, uncomplicated: Secondary | ICD-10-CM | POA: Diagnosis present

## 2019-12-13 DIAGNOSIS — E860 Dehydration: Secondary | ICD-10-CM | POA: Diagnosis present

## 2019-12-13 DIAGNOSIS — C3412 Malignant neoplasm of upper lobe, left bronchus or lung: Principal | ICD-10-CM | POA: Diagnosis present

## 2019-12-13 DIAGNOSIS — R64 Cachexia: Secondary | ICD-10-CM | POA: Diagnosis present

## 2019-12-13 DIAGNOSIS — I10 Essential (primary) hypertension: Secondary | ICD-10-CM | POA: Diagnosis present

## 2019-12-13 DIAGNOSIS — Z833 Family history of diabetes mellitus: Secondary | ICD-10-CM

## 2019-12-13 DIAGNOSIS — E43 Unspecified severe protein-calorie malnutrition: Secondary | ICD-10-CM | POA: Diagnosis present

## 2019-12-13 DIAGNOSIS — Z9221 Personal history of antineoplastic chemotherapy: Secondary | ICD-10-CM | POA: Diagnosis not present

## 2019-12-13 DIAGNOSIS — D696 Thrombocytopenia, unspecified: Secondary | ICD-10-CM | POA: Diagnosis not present

## 2019-12-13 DIAGNOSIS — I252 Old myocardial infarction: Secondary | ICD-10-CM | POA: Diagnosis not present

## 2019-12-13 DIAGNOSIS — Z79899 Other long term (current) drug therapy: Secondary | ICD-10-CM | POA: Diagnosis not present

## 2019-12-13 DIAGNOSIS — C787 Secondary malignant neoplasm of liver and intrahepatic bile duct: Secondary | ICD-10-CM | POA: Diagnosis present

## 2019-12-13 DIAGNOSIS — E785 Hyperlipidemia, unspecified: Secondary | ICD-10-CM | POA: Diagnosis present

## 2019-12-13 DIAGNOSIS — Z7901 Long term (current) use of anticoagulants: Secondary | ICD-10-CM | POA: Diagnosis not present

## 2019-12-13 DIAGNOSIS — C771 Secondary and unspecified malignant neoplasm of intrathoracic lymph nodes: Secondary | ICD-10-CM | POA: Diagnosis present

## 2019-12-13 DIAGNOSIS — R531 Weakness: Secondary | ICD-10-CM | POA: Diagnosis not present

## 2019-12-13 DIAGNOSIS — N179 Acute kidney failure, unspecified: Secondary | ICD-10-CM | POA: Diagnosis not present

## 2019-12-13 DIAGNOSIS — D649 Anemia, unspecified: Secondary | ICD-10-CM | POA: Diagnosis not present

## 2019-12-13 DIAGNOSIS — N189 Chronic kidney disease, unspecified: Secondary | ICD-10-CM | POA: Diagnosis not present

## 2019-12-13 DIAGNOSIS — K219 Gastro-esophageal reflux disease without esophagitis: Secondary | ICD-10-CM | POA: Diagnosis present

## 2019-12-13 DIAGNOSIS — Z515 Encounter for palliative care: Secondary | ICD-10-CM | POA: Diagnosis present

## 2019-12-13 DIAGNOSIS — I9589 Other hypotension: Secondary | ICD-10-CM | POA: Diagnosis not present

## 2019-12-13 DIAGNOSIS — R627 Adult failure to thrive: Secondary | ICD-10-CM | POA: Diagnosis present

## 2019-12-13 DIAGNOSIS — C3490 Malignant neoplasm of unspecified part of unspecified bronchus or lung: Secondary | ICD-10-CM | POA: Diagnosis present

## 2019-12-13 DIAGNOSIS — Z20822 Contact with and (suspected) exposure to covid-19: Secondary | ICD-10-CM | POA: Diagnosis present

## 2019-12-13 LAB — CBC WITH DIFFERENTIAL/PLATELET
Abs Immature Granulocytes: 0.03 10*3/uL (ref 0.00–0.07)
Basophils Absolute: 0 10*3/uL (ref 0.0–0.1)
Basophils Relative: 1 %
Eosinophils Absolute: 0 10*3/uL (ref 0.0–0.5)
Eosinophils Relative: 1 %
HCT: 38.1 % — ABNORMAL LOW (ref 39.0–52.0)
Hemoglobin: 12.2 g/dL — ABNORMAL LOW (ref 13.0–17.0)
Immature Granulocytes: 1 %
Lymphocytes Relative: 40 %
Lymphs Abs: 1.3 10*3/uL (ref 0.7–4.0)
MCH: 29 pg (ref 26.0–34.0)
MCHC: 32 g/dL (ref 30.0–36.0)
MCV: 90.7 fL (ref 80.0–100.0)
Monocytes Absolute: 0.3 10*3/uL (ref 0.1–1.0)
Monocytes Relative: 11 %
Neutro Abs: 1.5 10*3/uL — ABNORMAL LOW (ref 1.7–7.7)
Neutrophils Relative %: 46 %
Platelets: 67 10*3/uL — ABNORMAL LOW (ref 150–400)
RBC: 4.2 MIL/uL — ABNORMAL LOW (ref 4.22–5.81)
RDW: 14.5 % (ref 11.5–15.5)
WBC: 3.2 10*3/uL — ABNORMAL LOW (ref 4.0–10.5)
nRBC: 2.8 % — ABNORMAL HIGH (ref 0.0–0.2)

## 2019-12-13 LAB — BASIC METABOLIC PANEL
Anion gap: 13 (ref 5–15)
BUN: 33 mg/dL — ABNORMAL HIGH (ref 8–23)
CO2: 21 mmol/L — ABNORMAL LOW (ref 22–32)
Calcium: 10.4 mg/dL — ABNORMAL HIGH (ref 8.9–10.3)
Chloride: 107 mmol/L (ref 98–111)
Creatinine, Ser: 1.4 mg/dL — ABNORMAL HIGH (ref 0.61–1.24)
GFR calc Af Amer: 58 mL/min — ABNORMAL LOW (ref >60)
GFR calc non Af Amer: 50 mL/min — ABNORMAL LOW (ref >60)
Glucose, Bld: 84 mg/dL (ref 70–99)
Potassium: 4.5 mmol/L (ref 3.5–5.1)
Sodium: 141 mmol/L (ref 135–145)

## 2019-12-13 MED ORDER — GLYCOPYRROLATE 0.2 MG/ML IJ SOLN: 0.2000 mg | INTRAMUSCULAR | Status: DC | PRN

## 2019-12-13 MED ORDER — ONDANSETRON 4 MG PO TBDP: 4.0000 mg | ORAL_TABLET | Freq: Four times a day (QID) | ORAL | Status: DC | PRN

## 2019-12-13 MED ORDER — TRAMADOL HCL 50 MG PO TABS: 50.0000 mg | ORAL_TABLET | Freq: Four times a day (QID) | ORAL | Status: DC | PRN

## 2019-12-13 MED ORDER — ALLOPURINOL 300 MG PO TABS
300.0000 mg | ORAL_TABLET | Freq: Two times a day (BID) | ORAL | Status: DC
  Administered 2019-12-13 – 2019-12-18 (×11): 300 mg via ORAL
  Filled 2019-12-13 (×11): qty 1

## 2019-12-13 MED ORDER — POLYVINYL ALCOHOL 1.4 % OP SOLN: 1.0000 [drp] | Freq: Four times a day (QID) | OPHTHALMIC | Status: DC | PRN

## 2019-12-13 MED ORDER — ALUM & MAG HYDROXIDE-SIMETH 200-200-20 MG/5ML PO SUSP: 60.0000 mL | ORAL | Status: DC | PRN

## 2019-12-13 MED ORDER — HALOPERIDOL 0.5 MG PO TABS
0.5000 mg | ORAL_TABLET | ORAL | Status: DC | PRN
  Administered 2019-12-19: 0.5 mg via ORAL
  Filled 2019-12-13: qty 1

## 2019-12-13 MED ORDER — SODIUM CHLORIDE 0.9 % IV SOLN: Freq: Once | INTRAVENOUS | Status: AC

## 2019-12-13 MED ORDER — SENNOSIDES-DOCUSATE SODIUM 8.6-50 MG PO TABS: 1.0000 | ORAL_TABLET | Freq: Every evening | ORAL | Status: DC | PRN

## 2019-12-13 MED ORDER — MORPHINE SULFATE (PF) 4 MG/ML IV SOLN: 2.0000 mg | INTRAVENOUS | Status: DC | PRN

## 2019-12-13 MED ORDER — ONDANSETRON HCL 4 MG/2ML IJ SOLN: 4.0000 mg | Freq: Four times a day (QID) | INTRAMUSCULAR | Status: DC | PRN

## 2019-12-13 MED ORDER — HALOPERIDOL LACTATE 2 MG/ML PO CONC
0.5000 mg | ORAL | Status: DC | PRN
  Administered 2019-12-18: 0.5 mg via SUBLINGUAL
  Filled 2019-12-13 (×3): qty 0.3

## 2019-12-13 MED ORDER — GUAIFENESIN-DM 100-10 MG/5ML PO SYRP: 10.0000 mL | ORAL_SOLUTION | ORAL | Status: DC | PRN

## 2019-12-13 MED ORDER — PANTOPRAZOLE SODIUM 20 MG PO TBEC
20.0000 mg | DELAYED_RELEASE_TABLET | Freq: Every day | ORAL | Status: DC
  Administered 2019-12-14 – 2019-12-18 (×5): 20 mg via ORAL
  Filled 2019-12-13 (×6): qty 1

## 2019-12-13 MED ORDER — ASPIRIN 81 MG PO CHEW
81.0000 mg | CHEWABLE_TABLET | Freq: Every day | ORAL | Status: DC
  Administered 2019-12-14 – 2019-12-18 (×5): 81 mg via ORAL
  Filled 2019-12-13 (×5): qty 1

## 2019-12-13 MED ORDER — GLYCOPYRROLATE 1 MG PO TABS: 1.0000 mg | ORAL_TABLET | ORAL | Status: DC | PRN

## 2019-12-13 MED ORDER — SODIUM CHLORIDE 0.9 % IV SOLN
8.0000 mg | Freq: Three times a day (TID) | INTRAVENOUS | Status: DC | PRN
  Filled 2019-12-13: qty 4

## 2019-12-13 MED ORDER — ONDANSETRON HCL 4 MG PO TABS: 4.0000 mg | ORAL_TABLET | Freq: Three times a day (TID) | ORAL | Status: DC | PRN

## 2019-12-13 MED ORDER — ONDANSETRON HCL 4 MG/2ML IJ SOLN: 4.0000 mg | Freq: Three times a day (TID) | INTRAMUSCULAR | Status: DC | PRN

## 2019-12-13 MED ORDER — MEGESTROL ACETATE 400 MG/10ML PO SUSP
800.0000 mg | Freq: Every day | ORAL | Status: DC
  Administered 2019-12-14 – 2019-12-18 (×5): 800 mg via ORAL
  Filled 2019-12-13 (×5): qty 20

## 2019-12-13 MED ORDER — ACETAMINOPHEN 650 MG RE SUPP: 650.0000 mg | Freq: Four times a day (QID) | RECTAL | Status: DC | PRN

## 2019-12-13 MED ORDER — APIXABAN 5 MG PO TABS
5.0000 mg | ORAL_TABLET | Freq: Two times a day (BID) | ORAL | Status: DC
  Administered 2019-12-13 – 2019-12-18 (×11): 5 mg via ORAL
  Filled 2019-12-13 (×11): qty 1

## 2019-12-13 MED ORDER — AMLODIPINE BESYLATE 10 MG PO TABS
10.0000 mg | ORAL_TABLET | Freq: Every day | ORAL | Status: DC
  Administered 2019-12-14 – 2019-12-18 (×5): 10 mg via ORAL
  Filled 2019-12-13 (×5): qty 1

## 2019-12-13 MED ORDER — BIOTENE DRY MOUTH MT LIQD: 15.0000 mL | OROMUCOSAL | Status: DC | PRN

## 2019-12-13 MED ORDER — ACETAMINOPHEN 325 MG PO TABS: 650.0000 mg | ORAL_TABLET | Freq: Four times a day (QID) | ORAL | Status: DC | PRN

## 2019-12-13 MED ORDER — HALOPERIDOL LACTATE 5 MG/ML IJ SOLN
0.5000 mg | INTRAMUSCULAR | Status: DC | PRN
  Administered 2019-12-15 – 2019-12-17 (×3): 0.5 mg via INTRAVENOUS
  Filled 2019-12-13 (×3): qty 1

## 2019-12-13 MED ORDER — ONDANSETRON 4 MG PO TBDP: 4.0000 mg | ORAL_TABLET | Freq: Three times a day (TID) | ORAL | Status: DC | PRN

## 2019-12-13 NOTE — Patient Outreach (Signed)
Jeremy Mack) Care Management  12/13/2019  Jeremy Mack [birth year] [id number]   Telephone Assessment   Subjective : Received secure chat message from Jeremy Shell, Jeremy Mack, [place] spoke w Jeremy Mack. She is really struggling. She is also very stressed because "he wont eat no matter what I fix him." Little/no family support from what I have heard. She really needs more support. One thing that might help is meal delivery - is that something that Jeremy Mack can help with? She also needs some education on how to care for someone who is in hospice - what to do about refusal to eat, what battles to fight and so on. She was easy to reach - has answered calls from me twice now. Id suggest trying both numbers - she answered home number today  Outreach call to [name], she discussed patient being stuck on the floor , she can't get him up and  doesn't have anyone family or neighbor  to help get him up. She discussed nurse coming later today. Discussed EMS support for getting patient off the floor , she states she will call.   37 Returned call wife, she reports EMS assisted with getting patient into the bed and he is resting now.  Discussed with wife,concern regarding need for assistance with meal, she states any help will be appreciated, with all she has to do ,  as long as she doesn't have to pay for it.   She discussed Hospice visit on this afternoon and patient daughter will be present, wife states she currently has a neighbor with her and she is so appreciative of support.   Plan Will update assigned RNCM and for  plan follow up call in the next week regarding follow up on  enrollment in Hospice services.  Social worker  consult placed for meal delivery program.      Jeremy Mack CM Care Plan Problem One     Most Recent Value  Care Plan Problem One  Recent hospitalization related to stroke  Role Documenting the Problem One  Care Management Telephonic Coordinator   Jeremy Mack CM Short Term Goal #1   Patient will report medthods to conserve energy within 21 days.  THN CM Short Term Goal #1 Start Date  11/12/19  Interventions for Short Term Goal #1  Discussed with wife, pending Hospice referral,will provide support for managing patient in home to support nutrition needs as he is able to tolerate, placed social worker referral to support patient nutrition .       Jeremy Draft, RN,  [place] Management Coordinator  [phone]- Mobile [phone]- Toll Free Main Office

## 2019-12-13 NOTE — ED Triage Notes (Signed)
Arrived by [facility] from home. As verbally communicated by EMS: Family sent patient for further evaluation before being placed in hospice care. Patient has increased fatigue, lethargy, and anorexia. EMS reports somnolence alert to voice.

## 2019-12-13 NOTE — H&P (Signed)
Jeremy Mack  [phone]   HEMATOLOGY ONCOLOGY INPATIENT ADMISSION NOTE   Jeremy Mack  DOB: 18-Jan-1946  MR#: [id number]  CSN#: [id number]    Patient Care Team: [name], [place] as PCP - General (Internal Medicine) Virgina Evener, Dawn, RN (Inactive) as Oncology Nurse Navigator Jon Billings, RN as [place] Management Luciana Axe, Angelia Mould, [facility] (Pharmacist) Kyung Rudd, MD as Consulting Physician (Radiation Oncology) Saporito, Maree Erie, LCSW as [place], Museum/gallery conservator as [place] Management  Reason for consult: end of life care   History of present illness: Jeremy Mack is well-known to me, under my care for his metastatic small cell lung cancer.  He was brought by EMS to [facility] emergency room today, for failure of thrive at home.   I last saw him and his wife in my office 2 days ago.  Due to his recent rapid deterioration, I chemo and recommended home hospice.  Hospice nurse went to the home today and discussed with patient and his wife.  Patient has been lethargic, disorientation, not eating or drinking much.  Hospice nurse recommended residential hospice. CODE STATUS was discussed with his wife, she refused DNR/DNI, and called EMS and sent him to [facility] ED.   MEDICAL HISTORY:  Past Medical History:  Diagnosis Date  . Cancer ([place])   . GERD (gastroesophageal reflux disease)   . Hyperlipemia   . Hypertension     SURGICAL HISTORY: Past Surgical History:  Procedure Laterality Date  . arm fracture surgery Right   . colon polyps removed    . ESOPHAGOGASTRODUODENOSCOPY (EGD) WITH PROPOFOL N/A 06/14/2018   Procedure: ESOPHAGOGASTRODUODENOSCOPY (EGD) WITH PROPOFOL;  Surgeon: [name], MD;  Location: [facility] ENDOSCOPY;  Service: Endoscopy;  Laterality: N/A;  . EUS N/A 06/14/2018   Procedure: ESOPHAGEAL ENDOSCOPIC ULTRASOUND (EUS) RADIAL;  Surgeon: [name], MD;  Location: [facility]  ENDOSCOPY;  Service: Endoscopy;  Laterality: N/A;  . FINE NEEDLE ASPIRATION N/A 06/14/2018   Procedure: FINE NEEDLE ASPIRATION (FNA) LINEAR;  Surgeon: [name], MD;  Location: [facility] ENDOSCOPY;  Service: Endoscopy;  Laterality: N/A;  . IR IMAGING GUIDED PORT INSERTION  08/26/2019  . TONSILLECTOMY      SOCIAL HISTORY: Social History   Socioeconomic History  . Marital status: Married    Spouse name: Not on file  . Number of children: 4  . Years of education: 3  . Highest education level: Not on file  Occupational History  . Occupation: Retired  Tobacco Use  . Smoking status: Current Every Day Smoker    Packs/day: 0.50    Types: Cigars  . Smokeless tobacco: Never Used  . Tobacco comment: Trying to quit  Substance and Sexual Activity  . Alcohol use: Yes    Alcohol/week: 0.0 standard drinks    Comment: Occasionally  . Drug use: No  . Sexual activity: Not on file  Other Topics Concern  . Not on file  Social History Narrative   Fun: Swimming, and outdoor sports   Denies religious beliefs effecting health care.    Social Determinants of Health   Financial Resource Strain:   . Difficulty of Paying Living Expenses: Not on file  Food Insecurity: No Food Insecurity  . Worried About Charity fundraiser in the Last Year: Never true  . Ran Out of Food in the Last Year: Never true  Transportation Needs: No Transportation Needs  . Lack of Transportation (Medical): No  . Lack  of Transportation (Non-Medical): No  Physical Activity:   . Days of Exercise per Week: Not on file  . Minutes of Exercise per Session: Not on file  Stress:   . Feeling of Stress : Not on file  Social Connections:   . Frequency of Communication with Friends and Family: Not on file  . Frequency of Social Gatherings with Friends and Family: Not on file  . Attends Religious Services: Not on file  . Active Member of Clubs or Organizations: Not on file  . Attends Archivist Meetings: Not on file  .  Marital Status: Not on file  Intimate Partner Violence:   . Fear of Current or Ex-Partner: Not on file  . Emotionally Abused: Not on file  . Physically Abused: Not on file  . Sexually Abused: Not on file    FAMILY HISTORY: Family History  Problem Relation Age of Onset  . Diabetes Mother   . Diabetes Maternal Grandmother     ALLERGIES:  has No Known Allergies.  MEDICATIONS:  No current facility-administered medications for this encounter.   Current Outpatient Medications  Medication Sig Dispense Refill  . allopurinol (ZYLOPRIM) 300 MG tablet Take 1 tablet (300 mg total) by mouth 2 (two) times daily. (Patient not taking: Reported on 12/11/2019) 180 tablet 3  . amLODipine (NORVASC) 10 MG tablet Take 1 tablet (10 mg total) by mouth daily. (Patient not taking: Reported on 12/11/2019) 90 tablet 3  . apixaban (ELIQUIS) 5 MG TABS tablet Take 1 tablet (5 mg total) by mouth 2 (two) times daily. (Patient not taking: Reported on 12/11/2019) 60 tablet 3  . aspirin 325 MG tablet Take 650 mg by mouth every 4 (four) hours as needed for mild pain or headache.    Marland Kitchen atorvastatin (LIPITOR) 40 MG tablet Take 1 tablet (40 mg total) by mouth daily at 6 PM. 90 tablet 3  . lidocaine-prilocaine (EMLA) cream Apply 1 application topically as needed. (Patient not taking: Reported on 12/11/2019) 30 g 3  . megestrol (MEGACE ES) 625 MG/5ML suspension Take 5 mLs (625 mg total) by mouth daily. 150 mL 1  . nicotine (NICODERM CQ - DOSED IN MG/24 HOURS) 14 mg/24hr patch Place 1 patch (14 mg total) onto the skin daily. (Patient not taking: Reported on 12/11/2019) 28 patch 0  . Omega-3 Fatty Acids (FISH OIL) 1000 MG CAPS Take 1,000 mg by mouth daily.     . ondansetron (ZOFRAN) 8 MG tablet Take 1 tablet (8 mg total) by mouth 2 (two) times daily as needed for refractory nausea / vomiting. Start on day 3 after carboplatin chemo. (Patient not taking: Reported on 12/11/2019) 30 tablet 1  . pantoprazole (PROTONIX) 20 MG tablet Take 1  tablet (20 mg total) by mouth daily. (Patient not taking: Reported on 12/11/2019) 90 tablet 3  . polyvinyl alcohol (LIQUIFILM TEARS) 1.4 % ophthalmic solution Place 1 drop into both eyes as needed for dry eyes.    Marland Kitchen prochlorperazine (COMPAZINE) 10 MG tablet Take 1 tablet (10 mg total) by mouth every 6 (six) hours as needed (Nausea or vomiting). (Patient not taking: Reported on 12/11/2019) 30 tablet 1  . traMADol (ULTRAM) 50 MG tablet Take 1 tablet (50 mg total) by mouth every 6 (six) hours as needed for moderate pain. (Patient not taking: Reported on 12/11/2019) 60 tablet 0    REVIEW OF SYSTEMS:   Patient is lethargic, disorientation, not able to answer most questions.  PHYSICAL EXAMINATION: ECOG PERFORMANCE STATUS: 4 - Bedbound  Vitals:  12/13/19 1623 12/13/19 1630  BP:  110/77  Pulse:  (!) 104  Resp:  10  Temp:  97.6 F (36.4 C)  SpO2: 99% 99%   There were no vitals filed for this visit.  Patient is lethargic, disoriented, not following commands Lung clear, no rhonchi or wheezing Regular heart rhythm Abdomen soft, no tenderness, bowel sounds normal No leg edema  LABORATORY DATA:  I have reviewed the data as listed Lab Results  Component Value Date   WBC 3.2 (L) 12/13/2019   HGB 12.2 (L) 12/13/2019   HCT 38.1 (L) 12/13/2019   MCV 90.7 12/13/2019   PLT 67 (L) 12/13/2019   Recent Labs    11/28/19 1028 11/29/19 0500 12/04/19 1128 12/11/19 1225 12/13/19 1642  NA 138   < > 139 139 141  K 4.5   < > 4.3 4.8 4.5  CL 103   < > 100 104 107  CO2 20*   < > 24 18* 21*  GLUCOSE 140*   < > 111* 121* 84  BUN 31*   < > 19 50* 33*  CREATININE 1.78*   < > 1.16 1.78* 1.40*  CALCIUM 10.9*   < > 11.2* 10.0 10.4*  GFRNONAA 38*   < > >60 38* 50*  GFRAA 44*   < > >60 44* 58*  PROT 8.2*  --  8.2* 8.0  --   ALBUMIN 3.5  --  3.4* 3.5  --   AST 91*  --  87* 101*  --   ALT 45*  --  56* 50*  --   ALKPHOS 262*  --  336* 316*  --   BILITOT 0.5  --  0.7 0.7  --    < > = values in this  interval not displayed.    RADIOGRAPHIC STUDIES: I have personally reviewed the radiological images as listed and agreed with the findings in the report. CT Abdomen Pelvis Wo Contrast  Result Date: 11/28/2019 CLINICAL DATA:  Restaging small cell lung cancer. EXAM: CT CHEST, ABDOMEN AND PELVIS WITHOUT CONTRAST TECHNIQUE: Multidetector CT imaging of the chest, abdomen and pelvis was performed following the standard protocol without IV contrast. COMPARISON:  07/30/2019 FINDINGS: CT CHEST FINDINGS Cardiovascular: Normal heart size. No pericardial effusion. Aortic atherosclerosis lad and left circumflex coronary artery calcifications. Mediastinum/Nodes: Normal appearance of the thyroid gland. The trachea appears patent and is midline. Normal appearance of the esophagus. Right paratracheal lymph node measures 2.3 cm. Previously 0.7 cm. Lower right paratracheal lymph node measures 1.8 cm, image 24/2. Previously 0.7 cm no enlarged supraclavicular or axillary lymph nodes. Lungs/Pleura: No pleural effusion. No airspace consolidation, atelectasis or pneumothorax. Right upper lobe pulmonary nodules are again noted in are increased from previous exam. Peripheral spiculated right upper lobe lung nodule measures 1 cm, image 60/4. Central right upper lobe perihilar nodule measures 1.1 cm, image 63/4. New from previous exam. Adjacent nodule measures 2.0 cm, image 65/4. Also new from previous exam. Musculoskeletal: Multifocal areas of mixed lytic and sclerotic bone metastases are noted. The appearance is similar to previous exam. CT ABDOMEN PELVIS FINDINGS Hepatobiliary: Multifocal liver metastases have progressed from previous exam. Lesions are increased in size and number in the interval. For example, within lateral segment of left lobe of liver there is a 4.9 cm hypodense metastasis, image 54/2. Previously 2.1 cm. Dome of liver lesion measures 5.2 cm, image 46/2. Previously 1.8 cm segment 5 liver lesion measures 1.6 cm,  image 63/2. Previously 0.8 cm. Lesion within segment 7  measures 2.2 cm, image 53/2. Previously 1.3 cm. Stones noted layering within the dependent portion of the gallbladder. No biliary ductal dilatation. Pancreas: Unremarkable. No pancreatic ductal dilatation or surrounding inflammatory changes. Spleen: Normal in size without focal abnormality. Adrenals/Urinary Tract: Normal right adrenal gland. There is a solid nodule in the left adrenal gland measuring 2 cm, image 57/2. Previously this measured the same. No kidney mass or hydronephrosis identified. Urinary bladder unremarkable. Stomach/Bowel: Stomach is within normal limits. Appendix not confidently identified. Postop change involving the sigmoid colon is again noted, stable. No evidence of bowel wall thickening, distention, or inflammatory changes. Vascular/Lymphatic: Aortic atherosclerosis. No abdominopelvic adenopathy. Reproductive: Enlargement of the prostate gland is again noted. Other: No free fluid or fluid collections. Musculoskeletal: Heterogeneous bone mineralization within the bony pelvis and lumbar spine is similar to previous exam compatible with osseous metastasis. IMPRESSION: 1. Interval progression of liver metastases. 2. Increase in size and number of right upper lobe pulmonary nodules. New right paratracheal adenopathy 3. Stable appearance of multifocal mixed lytic and sclerotic bone metastases. 4. Aortic atherosclerosis. Coronary artery calcifications noted. Aortic Atherosclerosis (ICD10-I70.0). Electronically Signed   By: Kerby Moors M.D.   On: 11/28/2019 14:24   CT Chest Wo Contrast  Result Date: 11/28/2019 CLINICAL DATA:  Restaging small cell lung cancer. EXAM: CT CHEST, ABDOMEN AND PELVIS WITHOUT CONTRAST TECHNIQUE: Multidetector CT imaging of the chest, abdomen and pelvis was performed following the standard protocol without IV contrast. COMPARISON:  07/30/2019 FINDINGS: CT CHEST FINDINGS Cardiovascular: Normal heart size. No  pericardial effusion. Aortic atherosclerosis lad and left circumflex coronary artery calcifications. Mediastinum/Nodes: Normal appearance of the thyroid gland. The trachea appears patent and is midline. Normal appearance of the esophagus. Right paratracheal lymph node measures 2.3 cm. Previously 0.7 cm. Lower right paratracheal lymph node measures 1.8 cm, image 24/2. Previously 0.7 cm no enlarged supraclavicular or axillary lymph nodes. Lungs/Pleura: No pleural effusion. No airspace consolidation, atelectasis or pneumothorax. Right upper lobe pulmonary nodules are again noted in are increased from previous exam. Peripheral spiculated right upper lobe lung nodule measures 1 cm, image 60/4. Central right upper lobe perihilar nodule measures 1.1 cm, image 63/4. New from previous exam. Adjacent nodule measures 2.0 cm, image 65/4. Also new from previous exam. Musculoskeletal: Multifocal areas of mixed lytic and sclerotic bone metastases are noted. The appearance is similar to previous exam. CT ABDOMEN PELVIS FINDINGS Hepatobiliary: Multifocal liver metastases have progressed from previous exam. Lesions are increased in size and number in the interval. For example, within lateral segment of left lobe of liver there is a 4.9 cm hypodense metastasis, image 54/2. Previously 2.1 cm. Dome of liver lesion measures 5.2 cm, image 46/2. Previously 1.8 cm segment 5 liver lesion measures 1.6 cm, image 63/2. Previously 0.8 cm. Lesion within segment 7 measures 2.2 cm, image 53/2. Previously 1.3 cm. Stones noted layering within the dependent portion of the gallbladder. No biliary ductal dilatation. Pancreas: Unremarkable. No pancreatic ductal dilatation or surrounding inflammatory changes. Spleen: Normal in size without focal abnormality. Adrenals/Urinary Tract: Normal right adrenal gland. There is a solid nodule in the left adrenal gland measuring 2 cm, image 57/2. Previously this measured the same. No kidney mass or hydronephrosis  identified. Urinary bladder unremarkable. Stomach/Bowel: Stomach is within normal limits. Appendix not confidently identified. Postop change involving the sigmoid colon is again noted, stable. No evidence of bowel wall thickening, distention, or inflammatory changes. Vascular/Lymphatic: Aortic atherosclerosis. No abdominopelvic adenopathy. Reproductive: Enlargement of the prostate gland is again noted. Other: No free  fluid or fluid collections. Musculoskeletal: Heterogeneous bone mineralization within the bony pelvis and lumbar spine is similar to previous exam compatible with osseous metastasis. IMPRESSION: 1. Interval progression of liver metastases. 2. Increase in size and number of right upper lobe pulmonary nodules. New right paratracheal adenopathy 3. Stable appearance of multifocal mixed lytic and sclerotic bone metastases. 4. Aortic atherosclerosis. Coronary artery calcifications noted. Aortic Atherosclerosis (ICD10-I70.0). Electronically Signed   By: Kerby Moors M.D.   On: 11/28/2019 14:24    ASSESSMENT & PLAN: 74 year old male, with small cell lung cancer, metastasis to liver, lymph nodes and bone, with recent disease progression on chemo   1.  Failure to thrive, due to #2 2.  Diffuse metastatic small cell lung cancer recently progressed 3.  Severe protein and calorie malnutrition 4.  Recent history of multifocal stroke 5. HTN   Plan: -Patient apparently is in his end of life, actively dying -I spoke with his wife, discussed CODE STATUS, she agreed with  DNR/DNI -I have contacted hospice and spoke with his hospice nurse Pam, and recommend residential hospice [facility]. They do not have a bed today, will contact us as soon as they have a open bed. -will admit pt to 6E for end of life comfort care, order placed -no lab, VS or any image study  -My on-call partner Dr. Alvy Bimler will round on him tomorrow   All questions were answered. The patient knows to call the clinic with any  problems, questions or concerns.    I spent a total of 60 mins for his care today   Truitt Merle, MD 12/13/2019 5:55 PM

## 2019-12-13 NOTE — ED Provider Notes (Signed)
[facility] DEPT Provider Note   CSN: [id number] Arrival date & time: 12/13/19  1613     History Chief Complaint  Patient presents with  . Failure To Thrive    Jeremy Mack is a 74 y.o. male.  Level 5 caveat secondary to altered mental status.  Unfortunate 74 year old male with metastatic lung cancer.  He is following with [name] from oncology.  He has recently been referred to hospice.  Per EMS his wife called due to increased fatigue lethargy and poor p.o. intake.  Patient unable to give any history.  The history is provided by the EMS personnel.  Altered Mental Status Presenting symptoms: lethargy   Severity:  Unable to specify Most recent episode:  Today Episode history:  Continuous Timing:  Constant Progression:  Waxing and waning Chronicity:  New      Past Medical History:  Diagnosis Date  . Cancer ([place])   . GERD (gastroesophageal reflux disease)   . Hyperlipemia   . Hypertension     Patient Active Problem List   Diagnosis Date Noted  . Hypercalcemia 12/04/2019  . Tumor lysis syndrome 11/28/2019  . Acute ischemic stroke ([place]) 10/01/2019  . Primary malignant neoplasm of bronchus of left upper lobe ([place]) 06/06/2019  . Goals of care, counseling/discussion 06/06/2019  . Pancreatic abscess   . Malnutrition of moderate degree 06/14/2018  . Pancreatic mass   . Abnormal CT of the abdomen   . Abdominal pain 06/12/2018  . Leukocytosis 06/12/2018  . AKI (acute kidney injury) ([place]) 06/12/2018  . Medicare annual wellness visit, subsequent 09/09/2016  . Routine general medical examination at a health care facility 09/09/2016  . Hyperlipidemia 09/07/2015  . Neck stiffness 05/20/2015  . Hip pain, chronic 05/20/2015  . Essential hypertension 05/20/2015    Past Surgical History:  Procedure Laterality Date  . arm fracture surgery Right   . colon polyps removed    . ESOPHAGOGASTRODUODENOSCOPY (EGD) WITH PROPOFOL N/A 06/14/2018   Procedure: ESOPHAGOGASTRODUODENOSCOPY (EGD) WITH PROPOFOL;  Surgeon: [name], MD;  Location: [facility] ENDOSCOPY;  Service: Endoscopy;  Laterality: N/A;  . EUS N/A 06/14/2018   Procedure: ESOPHAGEAL ENDOSCOPIC ULTRASOUND (EUS) RADIAL;  Surgeon: [name], MD;  Location: [facility] ENDOSCOPY;  Service: Endoscopy;  Laterality: N/A;  . FINE NEEDLE ASPIRATION N/A 06/14/2018   Procedure: FINE NEEDLE ASPIRATION (FNA) LINEAR;  Surgeon: [name], MD;  Location: [facility] ENDOSCOPY;  Service: Endoscopy;  Laterality: N/A;  . IR IMAGING GUIDED PORT INSERTION  08/26/2019  . TONSILLECTOMY         Family History  Problem Relation Age of Onset  . Diabetes Mother   . Diabetes Maternal Grandmother     Social History   Tobacco Use  . Smoking status: Current Every Day Smoker    Packs/day: 0.50    Types: Cigars  . Smokeless tobacco: Never Used  . Tobacco comment: Trying to quit  Substance Use Topics  . Alcohol use: Yes    Alcohol/week: 0.0 standard drinks    Comment: Occasionally  . Drug use: No    Home Medications Prior to Admission medications   Medication Sig Start Date End Date Taking? Authorizing Provider  allopurinol (ZYLOPRIM) 300 MG tablet Take 1 tablet (300 mg total) by mouth 2 (two) times daily. Patient not taking: Reported on 12/11/2019 11/01/19   [name], FNP  amLODipine (NORVASC) 10 MG tablet Take 1 tablet (10 mg total) by mouth daily. Patient not taking: Reported on 12/11/2019 11/01/19  [name], FNP  apixaban (ELIQUIS) 5 MG TABS tablet Take 1 tablet (5 mg total) by mouth 2 (two) times daily. Patient not taking: Reported on 12/11/2019 11/04/19 03/03/20  Truitt Merle, MD  aspirin 325 MG tablet Take 650 mg by mouth every 4 (four) hours as needed for mild pain or headache.    [provider]  atorvastatin (LIPITOR) 40 MG tablet Take 1 tablet (40 mg total) by mouth daily at 6 PM. 11/01/19 12/01/19  [name], FNP  lidocaine-prilocaine (EMLA) cream  Apply 1 application topically as needed. Patient not taking: Reported on 12/11/2019 09/02/19   Alla Feeling, NP  megestrol (MEGACE ES) 625 MG/5ML suspension Take 5 mLs (625 mg total) by mouth daily. 12/11/19   Truitt Merle, MD  nicotine (NICODERM CQ - DOSED IN MG/24 HOURS) 14 mg/24hr patch Place 1 patch (14 mg total) onto the skin daily. Patient not taking: Reported on 12/11/2019 06/06/19   Truitt Merle, MD  Omega-3 Fatty Acids (FISH OIL) 1000 MG CAPS Take 1,000 mg by mouth daily.     [provider]  ondansetron (ZOFRAN) 8 MG tablet Take 1 tablet (8 mg total) by mouth 2 (two) times daily as needed for refractory nausea / vomiting. Start on day 3 after carboplatin chemo. Patient not taking: Reported on 12/11/2019 06/06/19   Truitt Merle, MD  pantoprazole (PROTONIX) 20 MG tablet Take 1 tablet (20 mg total) by mouth daily. Patient not taking: Reported on 12/11/2019 11/01/19   [name], FNP  polyvinyl alcohol (LIQUIFILM TEARS) 1.4 % ophthalmic solution Place 1 drop into both eyes as needed for dry eyes.    [provider]  prochlorperazine (COMPAZINE) 10 MG tablet Take 1 tablet (10 mg total) by mouth every 6 (six) hours as needed (Nausea or vomiting). Patient not taking: Reported on 12/11/2019 06/06/19   Truitt Merle, MD  traMADol (ULTRAM) 50 MG tablet Take 1 tablet (50 mg total) by mouth every 6 (six) hours as needed for moderate pain. Patient not taking: Reported on 12/11/2019 11/26/19   Truitt Merle, MD    Allergies    Patient has no known allergies.  Review of Systems   Review of Systems  Unable to perform ROS: Mental status change    Physical Exam Updated Vital Signs BP 116/74 (BP Location: Left Arm)   Pulse 98   Temp 98.2 F (36.8 C) (Oral)   Resp 17   Ht 6' (1.829 m) Comment: per patient  Wt 60.2 kg   SpO2 100%   BMI 18.00 kg/m   Physical Exam Vitals and nursing note reviewed.  Constitutional:      Appearance: He is well-developed and underweight.  HENT:     Head:  Normocephalic and atraumatic.  Eyes:     Conjunctiva/sclera: Conjunctivae normal.  Cardiovascular:     Rate and Rhythm: Normal rate and regular rhythm.     Heart sounds: No murmur.  Pulmonary:     Effort: Pulmonary effort is normal. No respiratory distress.     Breath sounds: Normal breath sounds.  Abdominal:     Palpations: Abdomen is soft.     Tenderness: There is no abdominal tenderness.  Musculoskeletal:        General: Normal range of motion.     Cervical back: Neck supple.     Right lower leg: No edema.     Left lower leg: No edema.  Skin:    General: Skin is warm and dry.  Capillary Refill: Capillary refill takes less than 2 seconds.  Neurological:     Mental Status: He is lethargic.     Comments: Patient is awake.  He will nod his head to questions intermittently.  Not following commands but does not seem to have any focal deficits.     ED Results / Procedures / Treatments   Labs (all labs ordered are listed, but only abnormal results are displayed) Labs Reviewed  BASIC METABOLIC PANEL - Abnormal; Notable for the following components:      Result Value   CO2 21 (*)    BUN 33 (*)    Creatinine, Ser 1.40 (*)    Calcium 10.4 (*)    GFR calc non Af Amer 50 (*)    GFR calc Af Amer 58 (*)    All other components within normal limits  CBC WITH DIFFERENTIAL/PLATELET - Abnormal; Notable for the following components:   WBC 3.2 (*)    RBC 4.20 (*)    Hemoglobin 12.2 (*)    HCT 38.1 (*)    Platelets 67 (*)    nRBC 2.8 (*)    Neutro Abs 1.5 (*)    All other components within normal limits  SARS CORONAVIRUS 2 (TAT 6-24 HRS)    EKG None  Radiology No results found.  Procedures Procedures (including critical care time)  Medications Ordered in ED Medications  0.9 %  sodium chloride infusion (has no administration in time range)    ED Course  I have reviewed the triage vital signs and the nursing notes.  Pertinent labs & imaging results that were available  during my care of the patient were reviewed by me and considered in my medical decision making (see chart for details).  Clinical Course as of Dec 13 1017  Fri Dec 13, 7615  [id number] 74 year old male with metastatic lung cancer here with failure to thrive at home.  He is DNR/DNI and it sounds like he is trending towards hospice.  His oncologist is working on trying to get him to bed directly to [facility].  We will get some screening labs to evaluate for dehydration and give some IV fluids.  He does not appear to be in any distress.   [MB]  33 [name] has evaluated the patient and she would like to admit him to her service for end-of-life care until he can be placed.   [MB]    Clinical Course User Index [MB] Hayden Rasmussen, MD   MDM Rules/Calculators/A&P                     [name] from oncology came down to department to talk about patient. She has made a referral to [facility] and is trying to get him to [facility] status. She has contacted wife and updated on patient status of him. [facility] if they have beds available. Will get screening labs and give IVF.  CODE STATUS DNR/DNI.  Final Clinical Impression(s) / ED Diagnoses Final diagnoses:  Primary malignant neoplasm of lung metastatic to other site, unspecified laterality [facility])  Primary malignant neoplasm of right lung metastatic to other site [facility])    Rx / DC Orders ED Discharge Orders    None       Hayden Rasmussen, MD 12/14/19 1021

## 2019-12-13 NOTE — Progress Notes (Signed)
[facility] CSW Progress Notes  Call from Newman Nickels, hospice RN who went to home to admit patient to hospice at home.  Family declined hospice at home, felt that patient needed to go to ED for "food and fluids."  Stated they were calling EMS for transport.    Edwyna Shell, LCSW Clinical Social Worker Phone:  [phone]

## 2019-12-13 NOTE — Progress Notes (Signed)
[facility] CSW Progress Notes  Spoke w wife on phone at request of medical oncologist - wanted CSW to assess for food/financial insecurity.  Per wife, "he (Mr Hoben) is just sitting on the floor, he will not get up.  I am trying to make the bed before the nurse from Hospice comes at 2."  Is frustrated with her inability to manage the physical aspects of patient's care.  Little/no support from family evidenced.  Also frustrated that "he will not eat anything that I fix for him, he just refuses to eat."  She is concerned that patient is not eating despite her efforts to find foods he will consume.  Wife needs information/education on how to best care for patient who is now transitioning to hospice care.  Discussed w her the differing needs of patients as they transition through the life cycle.  Wife believes there will be a visit from a nurse today at 2 PM.  She did not have any details on name of agency or person coming.  Wife did express concern about food - not so much that family cannot afford food, but that she has little time to prepare food for herself and often does not eat because she is consumed with caregiving responsibilities.  CSW messages [facility] RN CM wi request for consideration for any available support w meal delivery options.  CSW messaged Dr Lewayne Bunting nurse, Maxwell Caul and [facility] RN CM.      Edwyna Shell, LCSW Clinical Social Worker Phone:  [phone] Cell:  [phone]

## 2019-12-13 NOTE — Telephone Encounter (Signed)
Received update from Jeremy Mack regarding patient's decline and pending hospice evaluation. Patient's wife needs education regarding providing care and what to expect as disease continues to progress. Update provided to Hospice admission team.

## 2019-12-13 NOTE — Progress Notes (Signed)
Oncology phone note   I received a phone call from pt's nurse regarding his code status around 8pm.  Pt's daughter [name] arrived after his hospital admission, and requested full code and medical treatments for his cancer.  [name] was not listed in patient's chart before, and I have never met her. When I spoke with patient's wife [name] during admission today, I did ask Teresa's contact information, and [name] was not able to give to me.  I spoke with [name] on the phone, I explained patient's diagnosis, treatment course, incurable nature of his metastatic cancer, and overall very poor prognosis at this point.  I again recommended comfort care and told Helene Kelp that pt agreed with DNR and comfort care when I spoke with him. [name] voiced understanding and was very concerned that his nutrition status. After a lengthy discussion, code status was changed to full code. Pt agrees to talk to her sibling and pt's wife [name] (Theresa's step mother), and will meet my on-call partner Dr. Alvy Bimler tomorrow to discuss further.   I have resumed pt's home meds.   I have update the above to Dr. Alvy Bimler. I spoke with pt's nurse also.  Truitt Merle  12/13/2019

## 2019-12-14 DIAGNOSIS — C787 Secondary malignant neoplasm of liver and intrahepatic bile duct: Secondary | ICD-10-CM

## 2019-12-14 DIAGNOSIS — D61818 Other pancytopenia: Secondary | ICD-10-CM

## 2019-12-14 DIAGNOSIS — N189 Chronic kidney disease, unspecified: Secondary | ICD-10-CM

## 2019-12-14 DIAGNOSIS — C7951 Secondary malignant neoplasm of bone: Secondary | ICD-10-CM

## 2019-12-14 DIAGNOSIS — I252 Old myocardial infarction: Secondary | ICD-10-CM

## 2019-12-14 DIAGNOSIS — Z7901 Long term (current) use of anticoagulants: Secondary | ICD-10-CM

## 2019-12-14 DIAGNOSIS — C3412 Malignant neoplasm of upper lobe, left bronchus or lung: Principal | ICD-10-CM

## 2019-12-14 LAB — SARS CORONAVIRUS 2 (TAT 6-24 HRS): SARS Coronavirus 2: NEGATIVE

## 2019-12-14 MED ORDER — CHLORHEXIDINE GLUCONATE 0.12 % MT SOLN
15.0000 mL | Freq: Two times a day (BID) | OROMUCOSAL | Status: DC
  Administered 2019-12-14 – 2019-12-18 (×10): 15 mL via OROMUCOSAL
  Filled 2019-12-14 (×10): qty 15

## 2019-12-14 MED ORDER — ADULT MULTIVITAMIN W/MINERALS CH
1.0000 | ORAL_TABLET | Freq: Every day | ORAL | Status: DC
  Administered 2019-12-14 – 2019-12-18 (×5): 1 via ORAL
  Filled 2019-12-14 (×5): qty 1

## 2019-12-14 MED ORDER — BOOST PLUS PO LIQD
237.0000 mL | Freq: Three times a day (TID) | ORAL | Status: DC
  Administered 2019-12-14 – 2019-12-19 (×9): 237 mL via ORAL
  Filled 2019-12-14 (×17): qty 237

## 2019-12-14 MED ORDER — ORAL CARE MOUTH RINSE
15.0000 mL | Freq: Two times a day (BID) | OROMUCOSAL | Status: DC
  Administered 2019-12-14 – 2019-12-18 (×6): 15 mL via OROMUCOSAL

## 2019-12-14 NOTE — Progress Notes (Signed)
Initial Nutrition Assessment  DOCUMENTATION CODES:   Underweight  INTERVENTION:   -Calorie Count 1/16-1/17, results will be available 1/18 -Boost Plus TID- Each supplement provides 360kcal and 14g protein.   -Multivitamin with minerals daily  NUTRITION DIAGNOSIS:   Increased nutrient needs related to cancer and cancer related treatments as evidenced by estimated needs.  GOAL:   Patient will meet greater than or equal to 90% of their needs  MONITOR:   PO intake, Supplement acceptance, Labs, Weight trends, I & O's  REASON FOR ASSESSMENT:   Malnutrition Screening Tool, Consult Calorie Count  ASSESSMENT:   74 year old male with metastatic lung cancer.  He is following with [name] from oncology.  He has recently been referred to hospice.  Per EMS his wife called due to increased fatigue lethargy and poor p.o. intake.  Patient unable to give any history.  **RD working remotely**  MD has ordered for Calorie Count over the weekend. 1/16-1/17, results will be charted on 1/18. Pt on a regular diet.  Pt with intermittent impairment of mentation.  Per chart review, pt not a candidate for chemotherapy unless he improves for his met lung cancer.  PTA pt's code status focused mainly on comfort measures d/t a rapid decline. Pt has had poor appetite and reported issues with swallowing.   Pt attempting to eat meals. Will monitor for results of calorie count. Will order Boost supplements, as he has drank these in the past.   Per weight records, pt has lost  39 lbs since 10/11/19 (22% wt loss x 2 months, significant for time frame). Suspect some degree of malnutrition but unable to diagnose at this time.  Medications: Megace suspension Labs reviewed: CBGs: 106  NUTRITION - FOCUSED PHYSICAL EXAM:  Working remotely.  Diet Order:   Diet Order            Diet regular Room service appropriate? Yes; Fluid consistency: Thin  Diet effective now              EDUCATION NEEDS:    Not appropriate for education at this time  Skin:  Skin Assessment: Reviewed RN Assessment  Last BM:  1/15  Height:   Ht Readings from Last 1 Encounters:  12/14/19 6' (1.829 m)    Weight:   Wt Readings from Last 1 Encounters:  12/14/19 60.2 kg    Ideal Body Weight:  80.9 kg  BMI:  Body mass index is 18 kg/m.  Estimated Nutritional Needs:   Kcal:  6073-7106  Protein:  80-90g  Fluid:  1.8L/day  Clayton Bibles, MS, RD, LDN Inpatient Clinical Dietitian Pager: [phone] After Hours Pager: [phone]

## 2019-12-14 NOTE — Progress Notes (Signed)
Jeremy Mack   DOB:07-08-1946   JH#:[id number]    ASSESSMENT & PLAN:  Metastatic small cell lung cancer to the liver and bone Overall, he has decline in performance status He is not a candidate for systemic chemotherapy unless his performance status improves For now, continue supportive care  Pancytopenia This is likely due to bone marrow involvement from cancer He is not symptomatic He is on Eliquis and denies bleeding complications There is no contraindication to remain on antiplatelet agents or anticoagulants as long as the platelet is greater than 50,000.  Chronic renal failure This is stable, likely due to component of dehydration and probably baseline chronic kidney disease He appears to be able to swallow, eat and drink well Observe only for now  Intermittent hypercalcemia This is likely related to component of dehydration and metastatic cancer His serum calcium yesterday was mildly elevated but could be a component of dehydration He had received Zometa recently Observe only for now  Malignant cachexia, failure to thrive, poor oral intake He is placed on Megace He appears to be eating well this morning Continue the same for now Monitor oral intake. It may be beneficial to do calorie count  History of stroke There were no new neurological deficits He will continue Eliquis for anticoagulation therapy and amlodipine  Code Status Full code for now based on Dr. Ernestina Mack discussion with family recently I reviewed his case and provide independent review in terms of his mental status and ability to make informed decisions According to discussion with Dr. Burr Mack and his ongoing metabolic issues, I concur that his mental status and judgment may be intermittently impaired due to his underlying metastatic cancer For now, we will go with family's wishes in terms of CODE STATUS and he will remain on full code for now  Goals of care The initial intent of him being admitted was to bridge and  transition his care from home-based palliative care to residential hospice However, given the change in Jeremy Mack as well as family's wishes for him to receive aggressive supportive care, at this point in time, I do not believe it is appropriate to move him to Jeremy Mack even if bed is available today until the goals of care is further discussed with his primary oncologist or if his principal caregivers agree with comfort care measures.  By law, his wife would be his official next of kin to make final decision for him. For now, the patient appears to be eating and swallowing well and there is no indication for artificial nutrition.  I attempted to call his wife twice but not able to get hold of her.  I left a voicemail I was able to get hold of Jeremy Mack, his daughter, and spent approximately 25 minutes over the phone informing her of his current situation and discussing plan of care.  Overall, Jeremy Mack felt that the patient may not be taking his medications correctly at home and may not be getting adequate care by her stepmother She felt that her stepmother is overwhelmed with taking care of him.  She felt that the patient will likely improve with aggressive supportive care in the hospital.  She is also shocked about the DNR decision which she felt was inappropriately made.  When the father was lucid yesterday, he has indicated that he wants to be resuscitated.  Overall, after very prolonged discussion, I recommend we proceed with supportive care in the hospital.  He will continue Megace and all his other home medications.  I  will keep his comfort care set in place.  We will keep him in the hospital to give him the maximum benefit of doubt and reassess early next week to see if he can be discharged back home with home-based palliative care or to look for residential facility.  Discharge planning He will not be discharged this weekend He will likely remain here on Monday morning and I will defer to his  oncologist for further discussion with family about goals of care and discharge planning   The total time spent in the appointment was 45 minutes encounter with patients including review of chart and various tests results, discussions about plan of care and coordination of care plan  Jeremy Lark, MD 12/14/2019 9:53 AM  Subjective:  The patient is seen on behalf of Dr. Burr Mack.  He was admitted yesterday through the emergency department due to failure to thrive. I have reviewed his records briefly This patient has metastatic small cell lung cancer to the liver and bone along with recent diagnosis of stroke. He has been treated appropriately with palliative chemotherapy up until recently. The patient has been on appetite stimulant at home with Megace to help with oral intake but despite that, continues to decline Most recently, Dr. Burr Mack recommended palliative care/hospice and patient and family were in agreement to proceed up until last evening when his wife change her mind and the patient was brought to the emergency room to be admitted and plan to have everything done  According to JeremyFeng, CODE STATUS was discussed in the outpatient clinic and in the emergency room.  Last evening, on admission, his CODE STATUS was confirmed to be DO NOT RESUSCITATE according to conversation with his wife However, late last evening, his daughter showed up and requested the patient to have everything done.  CODE STATUS status is changed to full code The patient continues on palliative care/comfort mode since admission When I review him this morning, he is eating breakfast.  I observed him eating some toast.  There were no difficulties with swallowing.  Family members were not present He denies pain Denies cough, chest pain or shortness of breath I spoke with nursing staff and she confirmed that the patient does not need anything else done today His blood work from emergency room is reviewed which shows stable chronic  kidney function with mild pancytopenia His vitals are stable today  Objective:  Vitals:   12/13/19 1936 12/14/19 0637  BP: 112/73 116/74  Pulse: (!) 101 98  Resp: 17 17  Temp: 97.7 F (36.5 C) 98.2 F (36.8 C)  SpO2: 98% 100%     Intake/Output Summary (Last 24 hours) at 12/14/2019 0953 Last data filed at 12/14/2019 0058 Gross per 24 hour  Intake --  Output 100 ml  Net -100 ml    GENERAL:alert, no distress and comfortable.  He looks thin and cachectic. SKIN: skin color, texture, turgor are normal, no rashes or significant lesions EYES: normal, Conjunctiva are pale and non-injected, sclera clear OROPHARYNX:no exudate, no erythema and lips, buccal mucosa, and tongue normal  NECK: supple, thyroid normal size, non-tender, without nodularity LYMPH:  no palpable lymphadenopathy in the cervical, axillary or inguinal LUNGS: clear to auscultation and percussion with normal breathing effort HEART: regular rate & rhythm and no murmurs and no lower extremity edema ABDOMEN:abdomen soft, non-tender and normal bowel sounds Musculoskeletal:no cyanosis of digits and no clubbing  NEURO: alert & oriented x 3 with fluent speech, no focal motor/sensory deficits.  He answers questions appropriately  and follows commands   Labs:  Recent Labs    11/28/19 1028 11/29/19 0500 12/04/19 1128 12/11/19 1225 12/13/19 1642  NA 138   < > 139 139 141  K 4.5   < > 4.3 4.8 4.5  CL 103   < > 100 104 107  CO2 20*   < > 24 18* 21*  GLUCOSE 140*   < > 111* 121* 84  BUN 31*   < > 19 50* 33*  CREATININE 1.78*   < > 1.16 1.78* 1.40*  CALCIUM 10.9*   < > 11.2* 10.0 10.4*  GFRNONAA 38*   < > >60 38* 50*  GFRAA 44*   < > >60 44* 58*  PROT 8.2*  --  8.2* 8.0  --   ALBUMIN 3.5  --  3.4* 3.5  --   AST 91*  --  87* 101*  --   ALT 45*  --  56* 50*  --   ALKPHOS 262*  --  336* 316*  --   BILITOT 0.5  --  0.7 0.7  --    < > = values in this interval not displayed.    Studies:  CT Abdomen Pelvis Wo  Contrast  Result Date: 11/28/2019 CLINICAL DATA:  Restaging small cell lung cancer. EXAM: CT CHEST, ABDOMEN AND PELVIS WITHOUT CONTRAST TECHNIQUE: Multidetector CT imaging of the chest, abdomen and pelvis was performed following the standard protocol without IV contrast. COMPARISON:  07/30/2019 FINDINGS: CT CHEST FINDINGS Cardiovascular: Normal heart size. No pericardial effusion. Aortic atherosclerosis lad and left circumflex coronary artery calcifications. Mediastinum/Nodes: Normal appearance of the thyroid gland. The trachea appears patent and is midline. Normal appearance of the esophagus. Right paratracheal lymph node measures 2.3 cm. Previously 0.7 cm. Lower right paratracheal lymph node measures 1.8 cm, image 24/2. Previously 0.7 cm no enlarged supraclavicular or axillary lymph nodes. Lungs/Pleura: No pleural effusion. No airspace consolidation, atelectasis or pneumothorax. Right upper lobe pulmonary nodules are again noted in are increased from previous exam. Peripheral spiculated right upper lobe lung nodule measures 1 cm, image 60/4. Central right upper lobe perihilar nodule measures 1.1 cm, image 63/4. New from previous exam. Adjacent nodule measures 2.0 cm, image 65/4. Also new from previous exam. Musculoskeletal: Multifocal areas of mixed lytic and sclerotic bone metastases are noted. The appearance is similar to previous exam. CT ABDOMEN PELVIS FINDINGS Hepatobiliary: Multifocal liver metastases have progressed from previous exam. Lesions are increased in size and number in the interval. For example, within lateral segment of left lobe of liver there is a 4.9 cm hypodense metastasis, image 54/2. Previously 2.1 cm. Dome of liver lesion measures 5.2 cm, image 46/2. Previously 1.8 cm segment 5 liver lesion measures 1.6 cm, image 63/2. Previously 0.8 cm. Lesion within segment 7 measures 2.2 cm, image 53/2. Previously 1.3 cm. Stones noted layering within the dependent portion of the gallbladder. No  biliary ductal dilatation. Pancreas: Unremarkable. No pancreatic ductal dilatation or surrounding inflammatory changes. Spleen: Normal in size without focal abnormality. Adrenals/Urinary Tract: Normal right adrenal gland. There is a solid nodule in the left adrenal gland measuring 2 cm, image 57/2. Previously this measured the same. No kidney mass or hydronephrosis identified. Urinary bladder unremarkable. Stomach/Bowel: Stomach is within normal limits. Appendix not confidently identified. Postop change involving the sigmoid colon is again noted, stable. No evidence of bowel wall thickening, distention, or inflammatory changes. Vascular/Lymphatic: Aortic atherosclerosis. No abdominopelvic adenopathy. Reproductive: Enlargement of the prostate gland is again noted. Other: No free fluid  or fluid collections. Musculoskeletal: Heterogeneous bone mineralization within the bony pelvis and lumbar spine is similar to previous exam compatible with osseous metastasis. IMPRESSION: 1. Interval progression of liver metastases. 2. Increase in size and number of right upper lobe pulmonary nodules. New right paratracheal adenopathy 3. Stable appearance of multifocal mixed lytic and sclerotic bone metastases. 4. Aortic atherosclerosis. Coronary artery calcifications noted. Aortic Atherosclerosis (ICD10-I70.0). Electronically Signed   By: Kerby Moors M.D.   On: 11/28/2019 14:24   CT Chest Wo Contrast  Result Date: 11/28/2019 CLINICAL DATA:  Restaging small cell lung cancer. EXAM: CT CHEST, ABDOMEN AND PELVIS WITHOUT CONTRAST TECHNIQUE: Multidetector CT imaging of the chest, abdomen and pelvis was performed following the standard protocol without IV contrast. COMPARISON:  07/30/2019 FINDINGS: CT CHEST FINDINGS Cardiovascular: Normal heart size. No pericardial effusion. Aortic atherosclerosis lad and left circumflex coronary artery calcifications. Mediastinum/Nodes: Normal appearance of the thyroid gland. The trachea appears  patent and is midline. Normal appearance of the esophagus. Right paratracheal lymph node measures 2.3 cm. Previously 0.7 cm. Lower right paratracheal lymph node measures 1.8 cm, image 24/2. Previously 0.7 cm no enlarged supraclavicular or axillary lymph nodes. Lungs/Pleura: No pleural effusion. No airspace consolidation, atelectasis or pneumothorax. Right upper lobe pulmonary nodules are again noted in are increased from previous exam. Peripheral spiculated right upper lobe lung nodule measures 1 cm, image 60/4. Central right upper lobe perihilar nodule measures 1.1 cm, image 63/4. New from previous exam. Adjacent nodule measures 2.0 cm, image 65/4. Also new from previous exam. Musculoskeletal: Multifocal areas of mixed lytic and sclerotic bone metastases are noted. The appearance is similar to previous exam. CT ABDOMEN PELVIS FINDINGS Hepatobiliary: Multifocal liver metastases have progressed from previous exam. Lesions are increased in size and number in the interval. For example, within lateral segment of left lobe of liver there is a 4.9 cm hypodense metastasis, image 54/2. Previously 2.1 cm. Dome of liver lesion measures 5.2 cm, image 46/2. Previously 1.8 cm segment 5 liver lesion measures 1.6 cm, image 63/2. Previously 0.8 cm. Lesion within segment 7 measures 2.2 cm, image 53/2. Previously 1.3 cm. Stones noted layering within the dependent portion of the gallbladder. No biliary ductal dilatation. Pancreas: Unremarkable. No pancreatic ductal dilatation or surrounding inflammatory changes. Spleen: Normal in size without focal abnormality. Adrenals/Urinary Tract: Normal right adrenal gland. There is a solid nodule in the left adrenal gland measuring 2 cm, image 57/2. Previously this measured the same. No kidney mass or hydronephrosis identified. Urinary bladder unremarkable. Stomach/Bowel: Stomach is within normal limits. Appendix not confidently identified. Postop change involving the sigmoid colon is again  noted, stable. No evidence of bowel wall thickening, distention, or inflammatory changes. Vascular/Lymphatic: Aortic atherosclerosis. No abdominopelvic adenopathy. Reproductive: Enlargement of the prostate gland is again noted. Other: No free fluid or fluid collections. Musculoskeletal: Heterogeneous bone mineralization within the bony pelvis and lumbar spine is similar to previous exam compatible with osseous metastasis. IMPRESSION: 1. Interval progression of liver metastases. 2. Increase in size and number of right upper lobe pulmonary nodules. New right paratracheal adenopathy 3. Stable appearance of multifocal mixed lytic and sclerotic bone metastases. 4. Aortic atherosclerosis. Coronary artery calcifications noted. Aortic Atherosclerosis (ICD10-I70.0). Electronically Signed   By: Kerby Moors M.D.   On: 11/28/2019 14:24

## 2019-12-15 NOTE — Progress Notes (Signed)
Jeremy Mack   DOB:09-10-1946   HD#:[id number]    ASSESSMENT & PLAN:  Metastatic small cell lung cancer to the liver and bone Overall, he has decline in performance status He is not a candidate for systemic chemotherapy unless his performance status improves For now, continue supportive care  Pancytopenia This is likely due to bone marrow involvement from cancer He is not symptomatic He is on Eliquis and denies bleeding complications There is no contraindication to remain on antiplatelet agents or anticoagulants as long as the platelet is greater than 50,000. Recheck CBC tomorrow  Chronic renal failure This is stable, likely due to component of dehydration and probably baseline chronic kidney disease He appears to be able to swallow, eat and drink well Observe only for now Repeat BMP tomorrow  Intermittent hypercalcemia This is likely related to component of dehydration and metastatic cancer His serum calcium yesterday was mildly elevated but could be a component of dehydration He had received Zometa recently Observe only for now Repeat BMP tomorrow  Malignant cachexia, failure to thrive, poor oral intake He is placed on Megace He appears to be eating well since admission Continue the same for now Monitor oral intake. Calorie count started and dietitian is following  History of stroke There were no new neurological deficits He will continue Eliquis for anticoagulation therapy and amlodipine  Code Status Full code for now based on Dr. Ernestina Penna discussion with family recently I reviewed his case and provide independent review in terms of his mental status and ability to make informed decisions According to discussion with [name] and his ongoing metabolic issues, I concur that his mental status and judgment may be intermittently impaired due to his underlying metastatic cancer For now, we will go with family's wishes in terms of CODE STATUS and he will remain on full code for  now  Goals of care The initial intent of him being admitted was to bridge and transition his care from home-based palliative care to residential hospice However, given the change in [name] as well as family's wishes for him to receive aggressive supportive care, at this point in time, I do not believe it is appropriate to move him to [facility] even if bed is available today until the goals of care is further discussed with his primary oncologist or if his principal caregivers agree with comfort care measures.  By law, his wife would be his official next of kin to make final decision for him. For now, the patient appears to be eating and swallowing well and there is no indication for artificial nutrition.  Yesterday, I attempted to call his wife twice but not able to get hold of her.  I left a voicemail.  I was able to get hold of [name], his daughter, and spent approximately 25 minutes over the phone informing her of his current situation and discussing plan of care.  Overall, Helene Kelp felt that the patient may not be taking his medications correctly at home and may not be getting adequate care by her stepmother She felt that her stepmother is overwhelmed with taking care of him.  She felt that the patient will likely improve with aggressive supportive care in the hospital.  She is also shocked about the DNR decision which she felt was inappropriately made.  When the father was lucid yesterday, he has indicated that he wants to be resuscitated.  Overall, after very prolonged discussion, I recommend we proceed with supportive care in the hospital.  He will  continue Megace and all his other home medications.  I will keep his comfort care set in place.  We will keep him in the hospital to give him the maximum benefit of doubt and reassess early next week to see if he can be discharged back home with home-based palliative care or to look for residential facility.  Update on 12/15/2019: I spoke with  [name] and she is happy to find out that the patient is slowly improving while hospitalized. She has updated her other siblings about the care plan She looks forward to having further discussion with [name] tomorrow  Discharge planning He will not be discharged this weekend He will likely remain here on Monday morning and I will defer to his oncologist for further discussion with family about goals of care and discharge planning     The total time spent in the appointment was 25 minutes encounter with patients including review of chart and various tests results, discussions about plan of care and coordination of care plan  [name], MD 12/15/2019 8:48 AM  Subjective:  The patient is seen.  Family is not present He felt better He indicated that he ate better Denies nausea or pain He had 1 bowel movement yesterday He felt happy that he feels stronger since hospitalized  Objective:  Vitals:   12/14/19 2155 12/15/19 0520  BP: 121/66 131/82  Pulse: (!) 103 100  Resp: 15 15  Temp: 98.8 F (37.1 C) 97.9 F (36.6 C)  SpO2: 100% 99%     Intake/Output Summary (Last 24 hours) at 12/15/2019 0848 Last data filed at 12/14/2019 2110 Gross per 24 hour  Intake 240 ml  Output 250 ml  Net -10 ml    GENERAL:alert, no distress and comfortable.  He looks thin and cachectic  SKIN: skin color, texture, turgor are normal, no rashes or significant lesions NEURO: alert & oriented x 3 with fluent speech, no focal motor/sensory deficits   Labs:  Recent Labs    11/28/19 1028 11/29/19 0500 12/04/19 1128 12/11/19 1225 12/13/19 1642  NA 138   < > 139 139 141  K 4.5   < > 4.3 4.8 4.5  CL 103   < > 100 104 107  CO2 20*   < > 24 18* 21*  GLUCOSE 140*   < > 111* 121* 84  BUN 31*   < > 19 50* 33*  CREATININE 1.78*   < > 1.16 1.78* 1.40*  CALCIUM 10.9*   < > 11.2* 10.0 10.4*  GFRNONAA 38*   < > >60 38* 50*  GFRAA 44*   < > >60 44* 58*  PROT 8.2*  --  8.2* 8.0  --   ALBUMIN 3.5  --  3.4*  3.5  --   AST 91*  --  87* 101*  --   ALT 45*  --  56* 50*  --   ALKPHOS 262*  --  336* 316*  --   BILITOT 0.5  --  0.7 0.7  --    < > = values in this interval not displayed.    Studies:  CT Abdomen Pelvis Wo Contrast  Result Date: 11/28/2019 CLINICAL DATA:  Restaging small cell lung cancer. EXAM: CT CHEST, ABDOMEN AND PELVIS WITHOUT CONTRAST TECHNIQUE: Multidetector CT imaging of the chest, abdomen and pelvis was performed following the standard protocol without IV contrast. COMPARISON:  07/30/2019 FINDINGS: CT CHEST FINDINGS Cardiovascular: Normal heart size. No pericardial effusion. Aortic atherosclerosis lad and left circumflex coronary  artery calcifications. Mediastinum/Nodes: Normal appearance of the thyroid gland. The trachea appears patent and is midline. Normal appearance of the esophagus. Right paratracheal lymph node measures 2.3 cm. Previously 0.7 cm. Lower right paratracheal lymph node measures 1.8 cm, image 24/2. Previously 0.7 cm no enlarged supraclavicular or axillary lymph nodes. Lungs/Pleura: No pleural effusion. No airspace consolidation, atelectasis or pneumothorax. Right upper lobe pulmonary nodules are again noted in are increased from previous exam. Peripheral spiculated right upper lobe lung nodule measures 1 cm, image 60/4. Central right upper lobe perihilar nodule measures 1.1 cm, image 63/4. New from previous exam. Adjacent nodule measures 2.0 cm, image 65/4. Also new from previous exam. Musculoskeletal: Multifocal areas of mixed lytic and sclerotic bone metastases are noted. The appearance is similar to previous exam. CT ABDOMEN PELVIS FINDINGS Hepatobiliary: Multifocal liver metastases have progressed from previous exam. Lesions are increased in size and number in the interval. For example, within lateral segment of left lobe of liver there is a 4.9 cm hypodense metastasis, image 54/2. Previously 2.1 cm. Dome of liver lesion measures 5.2 cm, image 46/2. Previously 1.8 cm  segment 5 liver lesion measures 1.6 cm, image 63/2. Previously 0.8 cm. Lesion within segment 7 measures 2.2 cm, image 53/2. Previously 1.3 cm. Stones noted layering within the dependent portion of the gallbladder. No biliary ductal dilatation. Pancreas: Unremarkable. No pancreatic ductal dilatation or surrounding inflammatory changes. Spleen: Normal in size without focal abnormality. Adrenals/Urinary Tract: Normal right adrenal gland. There is a solid nodule in the left adrenal gland measuring 2 cm, image 57/2. Previously this measured the same. No kidney mass or hydronephrosis identified. Urinary bladder unremarkable. Stomach/Bowel: Stomach is within normal limits. Appendix not confidently identified. Postop change involving the sigmoid colon is again noted, stable. No evidence of bowel wall thickening, distention, or inflammatory changes. Vascular/Lymphatic: Aortic atherosclerosis. No abdominopelvic adenopathy. Reproductive: Enlargement of the prostate gland is again noted. Other: No free fluid or fluid collections. Musculoskeletal: Heterogeneous bone mineralization within the bony pelvis and lumbar spine is similar to previous exam compatible with osseous metastasis. IMPRESSION: 1. Interval progression of liver metastases. 2. Increase in size and number of right upper lobe pulmonary nodules. New right paratracheal adenopathy 3. Stable appearance of multifocal mixed lytic and sclerotic bone metastases. 4. Aortic atherosclerosis. Coronary artery calcifications noted. Aortic Atherosclerosis (ICD10-I70.0). Electronically Signed   By: Kerby Moors M.D.   On: 11/28/2019 14:24   CT Chest Wo Contrast  Result Date: 11/28/2019 CLINICAL DATA:  Restaging small cell lung cancer. EXAM: CT CHEST, ABDOMEN AND PELVIS WITHOUT CONTRAST TECHNIQUE: Multidetector CT imaging of the chest, abdomen and pelvis was performed following the standard protocol without IV contrast. COMPARISON:  07/30/2019 FINDINGS: CT CHEST FINDINGS  Cardiovascular: Normal heart size. No pericardial effusion. Aortic atherosclerosis lad and left circumflex coronary artery calcifications. Mediastinum/Nodes: Normal appearance of the thyroid gland. The trachea appears patent and is midline. Normal appearance of the esophagus. Right paratracheal lymph node measures 2.3 cm. Previously 0.7 cm. Lower right paratracheal lymph node measures 1.8 cm, image 24/2. Previously 0.7 cm no enlarged supraclavicular or axillary lymph nodes. Lungs/Pleura: No pleural effusion. No airspace consolidation, atelectasis or pneumothorax. Right upper lobe pulmonary nodules are again noted in are increased from previous exam. Peripheral spiculated right upper lobe lung nodule measures 1 cm, image 60/4. Central right upper lobe perihilar nodule measures 1.1 cm, image 63/4. New from previous exam. Adjacent nodule measures 2.0 cm, image 65/4. Also new from previous exam. Musculoskeletal: Multifocal areas of mixed lytic and sclerotic  bone metastases are noted. The appearance is similar to previous exam. CT ABDOMEN PELVIS FINDINGS Hepatobiliary: Multifocal liver metastases have progressed from previous exam. Lesions are increased in size and number in the interval. For example, within lateral segment of left lobe of liver there is a 4.9 cm hypodense metastasis, image 54/2. Previously 2.1 cm. Dome of liver lesion measures 5.2 cm, image 46/2. Previously 1.8 cm segment 5 liver lesion measures 1.6 cm, image 63/2. Previously 0.8 cm. Lesion within segment 7 measures 2.2 cm, image 53/2. Previously 1.3 cm. Stones noted layering within the dependent portion of the gallbladder. No biliary ductal dilatation. Pancreas: Unremarkable. No pancreatic ductal dilatation or surrounding inflammatory changes. Spleen: Normal in size without focal abnormality. Adrenals/Urinary Tract: Normal right adrenal gland. There is a solid nodule in the left adrenal gland measuring 2 cm, image 57/2. Previously this measured the  same. No kidney mass or hydronephrosis identified. Urinary bladder unremarkable. Stomach/Bowel: Stomach is within normal limits. Appendix not confidently identified. Postop change involving the sigmoid colon is again noted, stable. No evidence of bowel wall thickening, distention, or inflammatory changes. Vascular/Lymphatic: Aortic atherosclerosis. No abdominopelvic adenopathy. Reproductive: Enlargement of the prostate gland is again noted. Other: No free fluid or fluid collections. Musculoskeletal: Heterogeneous bone mineralization within the bony pelvis and lumbar spine is similar to previous exam compatible with osseous metastasis. IMPRESSION: 1. Interval progression of liver metastases. 2. Increase in size and number of right upper lobe pulmonary nodules. New right paratracheal adenopathy 3. Stable appearance of multifocal mixed lytic and sclerotic bone metastases. 4. Aortic atherosclerosis. Coronary artery calcifications noted. Aortic Atherosclerosis (ICD10-I70.0). Electronically Signed   By: Kerby Moors M.D.   On: 11/28/2019 14:24

## 2019-12-15 NOTE — Progress Notes (Signed)
Patient's bed alarm went off,Jeremy Mack and myself went to the room and found patient siting at the foot of the bed and foot board pulled out  lying on the floor.Jeremy asked what happened , patient's wife said her husband wanted to get out of bed so she pulled the board out. Education given to patient's wife to call for help anytime patient needs help.Patient was helped  back to bed and bed alarm activated.

## 2019-12-16 DIAGNOSIS — Z8673 Personal history of transient ischemic attack (TIA), and cerebral infarction without residual deficits: Secondary | ICD-10-CM

## 2019-12-16 DIAGNOSIS — Z79899 Other long term (current) drug therapy: Secondary | ICD-10-CM

## 2019-12-16 DIAGNOSIS — D649 Anemia, unspecified: Secondary | ICD-10-CM

## 2019-12-16 DIAGNOSIS — D696 Thrombocytopenia, unspecified: Secondary | ICD-10-CM

## 2019-12-16 LAB — BASIC METABOLIC PANEL
Anion gap: 11 (ref 5–15)
BUN: 12 mg/dL (ref 8–23)
CO2: 21 mmol/L — ABNORMAL LOW (ref 22–32)
Calcium: 10.5 mg/dL — ABNORMAL HIGH (ref 8.9–10.3)
Chloride: 105 mmol/L (ref 98–111)
Creatinine, Ser: 0.92 mg/dL (ref 0.61–1.24)
GFR calc Af Amer: >60 mL/min (ref >60)
GFR calc non Af Amer: >60 mL/min (ref >60)
Glucose, Bld: 99 mg/dL (ref 70–99)
Potassium: 4.2 mmol/L (ref 3.5–5.1)
Sodium: 137 mmol/L (ref 135–145)

## 2019-12-16 LAB — CBC WITH DIFFERENTIAL/PLATELET
Abs Immature Granulocytes: 0.2 10*3/uL — ABNORMAL HIGH (ref 0.00–0.07)
Basophils Absolute: 0 10*3/uL (ref 0.0–0.1)
Basophils Relative: 1 %
Eosinophils Absolute: 0 10*3/uL (ref 0.0–0.5)
Eosinophils Relative: 1 %
HCT: 31.4 % — ABNORMAL LOW (ref 39.0–52.0)
Hemoglobin: 10.8 g/dL — ABNORMAL LOW (ref 13.0–17.0)
Immature Granulocytes: 5 %
Lymphocytes Relative: 35 %
Lymphs Abs: 1.4 10*3/uL (ref 0.7–4.0)
MCH: 29.6 pg (ref 26.0–34.0)
MCHC: 34.4 g/dL (ref 30.0–36.0)
MCV: 86 fL (ref 80.0–100.0)
Monocytes Absolute: 0.4 10*3/uL (ref 0.1–1.0)
Monocytes Relative: 11 %
Neutro Abs: 1.9 10*3/uL (ref 1.7–7.7)
Neutrophils Relative %: 47 %
Platelets: 72 10*3/uL — ABNORMAL LOW (ref 150–400)
RBC: 3.65 MIL/uL — ABNORMAL LOW (ref 4.22–5.81)
RDW: 14.6 % (ref 11.5–15.5)
WBC: 4 10*3/uL (ref 4.0–10.5)
nRBC: 1.5 % — ABNORMAL HIGH (ref 0.0–0.2)

## 2019-12-16 MED ORDER — CHLORHEXIDINE GLUCONATE CLOTH 2 % EX PADS: 6.0000 | MEDICATED_PAD | Freq: Every day | CUTANEOUS | Status: DC

## 2019-12-16 NOTE — Evaluation (Signed)
Physical Therapy Evaluation Patient Details Name: Jeremy Mack MRN: [id number] DOB: 03-Jun-1946 Today's Date: 12/16/2019   History of Present Illness  Pt is 74 yo male with metastatic small cell lung cancer who was brought to ED for failure to thrive. Reviewed oncology notes and pt with poor prognosis with life expectancy few weeks to a month.  Per MD notes home with hospice vs [facility] was recommended but family wants to pursue rehab at this time if pt able to participate vs home with maximal support covered by insurance.  Clinical Impression   Pt admitted with above diagnosis. Pt was able to participate with therapy and ambulated 40' with min A.  He had poor balance and a history of falls.  Pt has 24 hr care from wife, however, she is not able to physically assist or prevent pt from falling.  Pt is not at baseline level of function and fall risk if return home.  Recommend SNF if unable to get 24 hr able bodied care.  Pt currently with functional limitations due to the deficits listed below (see PT Problem List). Pt will benefit from skilled PT to increase their independence and safety with mobility to allow discharge to the venue listed below.       Follow Up Recommendations SNF;Supervision/Assistance - 24 hour((SNF vs Home with 24 hr able bodied care))    Equipment Recommendations  None recommended by PT    Recommendations for Other Services       Precautions / Restrictions Precautions Precautions: Fall      Mobility  Bed Mobility                  Transfers Overall transfer level: Needs assistance Equipment used: Rolling walker (2 wheeled) Transfers: Sit to/from [facility] Sit to Stand: Min assist Stand pivot transfers: Min assist       General transfer comment: sit to stand x 2; cues for safe hand placement; cues and assist to turn all of the way prior to sitting  Ambulation/Gait Ambulation/Gait assistance: Min assist Gait Distance (Feet): 40  Feet Assistive device: Rolling walker (2 wheeled) Gait Pattern/deviations: Decreased stride length;Narrow base of support Gait velocity: decreased   General Gait Details: mild-moderate unsteadiness, fatigued easily, assist with navigating RW, cued for increased stride length with minimal improvement  Stairs            Wheelchair Mobility    Modified Rankin (Stroke Patients Only)       Balance Overall balance assessment: Needs assistance Sitting-balance support: Bilateral upper extremity supported;Feet supported Sitting balance-Leahy Scale: Fair     Standing balance support: Bilateral upper extremity supported;During functional activity Standing balance-Leahy Scale: Poor                               Pertinent Vitals/Pain Pain Assessment: No/denies pain    Home Living Family/patient expects to be discharged to:: Unsure Living Arrangements: Spouse/significant other Available Help at Discharge: Family;Available 24 hours/day(Pt's wife available but she is not able to provide physical assist.  Note that pt fell last night in hospital while wife in room trying to assist him OOB) Type of Home: House Home Access: Stairs to enter Entrance Stairs-Rails: None Entrance Stairs-Number of Steps: 1 Home Layout: One level Home Equipment: Bedside commode;Shower seat;Adaptive equipment;Walker - 2 wheels;Cane - single point Additional Comments: Note large amount of information taken from prior hospitalization.  Pt not able.  Wife was present but  provided limited information and was looking for keys to leave.    Prior Function Level of Independence: Needs assistance   Gait / Transfers Assistance Needed: Wife reports household ambulation with cane or walker  ADL's / Homemaking Assistance Needed: Wife reports "he does about half and half" in regards to ADLs        Hand Dominance        Extremity/Trunk Assessment   Upper Extremity Assessment Upper Extremity  Assessment: LUE deficits/detail;RUE deficits/detail RUE Deficits / Details: ROM WFL; Strength grossly 4/5; moves slowly LUE Deficits / Details: ROM WFL; Strength grossly 4/5; moves slowly    Lower Extremity Assessment Lower Extremity Assessment: LLE deficits/detail;RLE deficits/detail RLE Deficits / Details: ROM WFL except bil knees lacking ~10 degrees ext with tight endfeel; Strength grossly 4/5; moves slowly LLE Deficits / Details: ROM WFL except bil knees lacking ~10 degrees ext with tight endfeel; Strength grossly 4/5; moves slowly       Communication   Communication: No difficulties  Cognition Arousal/Alertness: Awake/alert Behavior During Therapy: Flat affect;Impulsive(At arrival RN in room and pt had scooted down toward foot of bed and trying to get up, wife present) Overall Cognitive Status: Impaired/Different from baseline Area of Impairment: Orientation;Problem solving;Attention;Following commands;Safety/judgement;Awareness                               General Comments: Wife was present but not able to answer these questions (focused on looking for keys to leave)      General Comments General comments (skin integrity, edema, etc.): Unable to get pulse oximeter reading: pt denied [facility] and no signs of [facility]    Exercises     Assessment/Plan    PT Assessment Patient needs continued PT services  PT Problem List Decreased strength;Decreased mobility;Decreased safety awareness;Decreased coordination;Decreased activity tolerance;Decreased skin integrity;Decreased knowledge of use of DME;Decreased balance       PT Treatment Interventions DME instruction;Therapeutic activities;Gait training;Therapeutic exercise;Patient/family education;Balance training;Functional mobility training    PT Goals (Current goals can be found in the Care Plan section)  Acute Rehab PT Goals Patient Stated Goal: go home PT Goal Formulation: With patient/family Time For Goal Achievement:  12/30/19 Potential to Achieve Goals: Good    Frequency Min 3X/week   Barriers to discharge        Co-evaluation               AM-PAC PT "6 Clicks" Mobility  Outcome Measure Help needed turning from your back to your side while in a flat bed without using bedrails?: A Little Help needed moving from lying on your back to sitting on the side of a flat bed without using bedrails?: A Little Help needed moving to and from a bed to a chair (including a wheelchair)?: A Little Help needed standing up from a chair using your arms (e.g., wheelchair or bedside chair)?: A Little Help needed to walk in hospital room?: A Little Help needed climbing 3-5 steps with a railing? : A Lot 6 Click Score: 17    End of Session Equipment Utilized During Treatment: Gait belt Activity Tolerance: Patient limited by fatigue Patient left: in chair;with chair alarm set;with call bell/phone within reach(feet elevated, tray in front of pt; RN aware) Nurse Communication: Mobility status PT Visit Diagnosis: Unsteadiness on feet (R26.81);History of falling (Z91.81);Muscle weakness (generalized) (M62.81)    Time: 9833-8250 PT Time Calculation (min) (ACUTE ONLY): 30 min   Charges:   PT Evaluation $PT  Eval Low Complexity: 1 Low PT Treatments $Gait Training: 8-22 mins        Maggie Font, PT Acute Rehab Services Pager [phone] [facility] [phone] [facility] [place] 12/16/2019, 5:32 PM

## 2019-12-16 NOTE — Progress Notes (Signed)
   12/16/19 1400  Clinical Encounter Type  Visited With Patient and family together  Visit Type Initial;Other (Comment) (AD)  Referral From Nurse  Consult/Referral To Chaplain  This chaplain responded to unit request from [facility] for Pt. AD.  The Pt., wife and two children were present with the Pt. at the time of AD education with the chaplain.  The Pt. was able to articulate his understanding and choices to complete the AD.  The completed AD document was left in the Pt. room on the computer stand. The chaplain communicated with the family, they did not have to be present for notarizing the Pt. signature. The chaplain will communicate with the notary the document is ready for signing. The chaplain shared with the Pt. and family spiritual care F/U is available as needed.

## 2019-12-16 NOTE — Progress Notes (Signed)
Nutrition Follow-up  DOCUMENTATION CODES:   Underweight  INTERVENTION:  - continue Boost Plus TID. - continue to encourage PO intakes.    NUTRITION DIAGNOSIS:   Increased nutrient needs related to cancer and cancer related treatments as evidenced by estimated needs. -ongoing  GOAL:   Patient will meet greater than or equal to 90% of their needs - likely unmet  MONITOR:   PO intake, Supplement acceptance, Labs, Weight trends, I & O's  ASSESSMENT:   74 year old male with metastatic lung cancer.  He is following with [name] from oncology.  He has recently been referred to hospice.  Per EMS his wife called due to increased fatigue lethargy and poor p.o. intake.  Patient unable to give any history.  No intakes documented since admission. Patient is a/o to self. 3 family members and provider at bedside having a discussion at the time of attempted visit. A 48 hour Calorie Count was ordered for 1/16-1/17, but no envelope on the door for meal tickets. Unable to complete any portion of the Calorie Count. Per review of orders, patient has accepted 4 of 5 bottles of Boost Plus offered to him.   Per notes: - metastatic small cell lung cancer with liver and bone mets--not a candidate for chemo at this time - pancytopenia - chronic renal failure--stable - intermittent hypercalcemia - malignant cachexia, FTT--megace ordered' - remains Full Code   Labs reviewed; Ca: 10.5 mg/dl. Medications reviewed; 800 mg megace/day, daily multivitamin with minerals, 20 mg oral protonix/day.    NUTRITION - FOCUSED PHYSICAL EXAM:  unable to complete at this time.   Diet Order:   Diet Order            Diet regular Room service appropriate? Yes; Fluid consistency: Thin  Diet effective now              EDUCATION NEEDS:   Not appropriate for education at this time  Skin:  Skin Assessment: Reviewed RN Assessment  Last BM:  1/16  Height:   Ht Readings from Last 1 Encounters:  12/14/19 6'  (1.829 m)    Weight:   Wt Readings from Last 1 Encounters:  12/14/19 60.2 kg    Ideal Body Weight:  80.9 kg  BMI:  Body mass index is 18 kg/m.  Estimated Nutritional Needs:   Kcal:  2119-4174  Protein:  80-90g  Fluid:  1.8L/day     Jarome Matin, MS, RD, LDN, CNSC Inpatient Clinical Dietitian Pager # [phone] After hours/weekend pager # [phone]

## 2019-12-16 NOTE — Progress Notes (Addendum)
HEMATOLOGY-ONCOLOGY PROGRESS NOTE  SUBJECTIVE: Jeremy Mack is resting in his bed quietly this morning.  He currently denies pain, chest discomfort, shortness of breath.  He denies abdominal discomfort, nausea, vomiting.  Patient did not realize that he was in the hospital and thought he was at home.  Oncology History Overview Note  Cancer Staging Small cell lung cancer ([place]) Staging form: Lung, AJCC 8th Edition - Clinical: Stage IVB (cTX, cN2, pM1c) - Signed by Truitt Merle, MD on 06/06/2019    Primary malignant neoplasm of bronchus of left upper lobe ([place])  07/11/2018 Imaging   CT AP 07/11/18 IMPRESSION: 1. Significantly improved appearance of the pancreas and peripancreatic tissues. Improved to resolved peripancreatic edema with resolved and significantly improved peripancreatic lesions. The remaining lesion was felt to represent abscess on prior endoscopic ultrasound sampling. Pseudocyst or infected pseudocyst would be the favored imaging diagnosis. 2. Persistent but improved mass effect upon the inferior aspect of the portal vein and superior aspect of the superior mesenteric vein. 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 3. Aortic Atherosclerosis (ICD10-I70.0). Bilateral common iliac artery ectasia. 4. A left adrenal nodule is indeterminate on precontrast imaging and may have decreased in size since the prior. Consider dedicated adrenal protocol CT. This could either be performed in 6 months to confirm size stability or more acutely, depending on clinical concern. 5. A splenic lesion is indeterminate and can be re-evaluated on follow-up. 6. Hepatic morphology suspicious for cirrhosis. 7. Cholelithiasis. 8.  Emphysema (ICD10-J43.9).   05/01/2019 Imaging   CT AP 05/01/19  IMPRESSION: 1. New diffuse liver metastases. 2. New mild retroperitoneal lymphadenopathy, consistent with metastatic disease. 3. Resolution of previously seen pancreatic mass since  previous study. No radiographic evidence of acute pancreatitis. 4. Stable left adrenal mass, consistent with adrenal adenoma. 5. Stable markedly enlarged prostate and findings of chronic bladder outlet obstruction. 6. Colonic diverticulosis. No radiographic evidence of diverticulitis. 7. Cholelithiasis.   Aortic Atherosclerosis (ICD10-I70.0).   05/28/2019 PET scan   PET 05/28/19  IMPRESSION: 1. Examination is positive for widespread FDG avid liver metastases. Liver lesions are too numerous to count. 2. Hypermetabolic mediastinal and upper abdominal nodal metastases. 3. Left upper lobe and right upper lobe hypermetabolic pulmonary nodules consistent with pulmonary metastasis. 4. Innumerable liver metastases noted within the axial and proximal appendicular skeleton. Bone lesions are too numerous to count. Some of these have a corresponding lytic lesion on CT images. Others appear occult on the corresponding CT images. 5. Solid hyperdense left adrenal nodule is not significantly changed from 05/01/2019 and exhibits mild FDG uptake. Indeterminate. 6. Prostate gland enlargement. Focal area of increased uptake within the left posterior gland is indeterminate.   06/04/2019 Initial Biopsy   Biopsy 06/04/19  Diagnosis Liver, needle/core biopsy - SMALL CELL CARCINOMA. SEE COMMENT.   06/06/2019 Initial Diagnosis   Small cell lung cancer ([place])   06/06/2019 Cancer Staging   Staging form: Lung, AJCC 8th Edition - Clinical: Stage IVB (cTX, cN2, pM1c) - Signed by Truitt Merle, MD on 06/06/2019   06/10/2019 - 11/04/2019 Chemotherapy   Carboplatin Day 1 and [place] day 1-3 with immunotherapy atezolizumab (Tecentriq) on day 1, every 3 weeks for 6 cycles starting 06/10/19. He is now on maintenance Tecentriq every 3 weeks. Stopped after 11/04/19 due to disease progression.    06/14/2019 Imaging    MRI brain 06/14/19  IMPRESSION: No brain metastases. Chronic small-vessel ischemic changes of the pons and  cerebral hemispheric white matter.   Probable metastatic lesions within the upper cervical  spine as seen. No sign of spinal canal encroachment.   07/30/2019 Imaging   CT CAP W Contrast  IMPRESSION: 1. Marked interval decrease in size of the hypermetabolic bilateral pulmonary nodules seen on previous PET-CT. 2. Clear interval decrease in the numerous hepatic metastases. 3. Interval resolution of mediastinal and upper abdominal lymphadenopathy seen previously. 4. Stable left adrenal nodule. This shows low level FDG uptake on prior PET-CT and is indeterminate. Continued attention on follow-up recommended. 5. No new or progressive findings to suggest disease progression. 6. Stable hypoenhancing splenic lesion. Attention on follow-up recommended. 7. Prostatomegaly. 8.  Aortic Atherosclerois (ICD10-170.0)     11/07/2019 - 11/21/2019 Radiation Therapy   Radiation to brain with Dr. Lisbeth Renshaw 11/07/19-11/21/19   11/28/2019 Imaging   CT CAP WO Contrast  IMPRESSION: 1. Interval progression of liver metastases. 2. Increase in size and number of right upper lobe pulmonary nodules. New right paratracheal adenopathy 3. Stable appearance of multifocal mixed lytic and sclerotic bone metastases. 4. Aortic atherosclerosis. Coronary artery calcifications noted.   Aortic Atherosclerosis (ICD10-I70.0).   12/04/2019 - 12/04/2019 Chemotherapy   Second-line Taxol starting 12/04/19. Only received 1 dose, will proceed with hospice.       REVIEW OF SYSTEMS:   Constitutional: Denies fevers, chills  Respiratory: Denies cough, dyspnea or wheezes Cardiovascular: Denies palpitation, chest discomfort Gastrointestinal:  Denies nausea, heartburn or change in bowel habits Skin: Denies abnormal skin rashes Lymphatics: Denies new lymphadenopathy or easy bruising Neurological:Denies numbness, tingling or new weaknesses Behavioral/Psych: Mood is stable, no new changes  Extremities: No lower extremity edema All  other systems were reviewed with the patient and are negative.  I have reviewed the past medical history, past surgical history, social history and family history with the patient and they are unchanged from previous note.   PHYSICAL EXAMINATION: ECOG PERFORMANCE STATUS: 3 - Symptomatic, >50% confined to bed  Vitals:   12/15/19 2129 12/16/19 0613  BP: 112/73 117/72  Pulse: (!) 103 99  Resp: 16 17  Temp: 97.9 F (36.6 C) 98.3 F (36.8 C)  SpO2: 100% 100%   Filed Weights   12/14/19 0151  Weight: 132 lb 11.5 oz (60.2 kg)    Intake/Output from previous day: 01/17 0701 - 01/18 0700 In: -  Out: 250 [Urine:250]  GENERAL:alert, chronically ill-appearing, no distress OROPHARYNX: No thrush or mucositis LUNGS: clear to auscultation and percussion with normal breathing effort HEART: regular rate & rhythm and no murmurs and no lower extremity edema ABDOMEN:abdomen soft, non-tender and normal bowel sounds Musculoskeletal:no cyanosis of digits and no clubbing  NEURO: alert & oriented x 3 with fluent speech, no focal motor/sensory deficits  LABORATORY DATA:  I have reviewed the data as listed CMP Latest Ref Rng & Units 12/16/2019 12/13/2019 12/11/2019  Glucose 70 - 99 mg/dL 99 84 121(H)  BUN 8 - 23 mg/dL 12 33(H) 50(H)  Creatinine 0.61 - 1.24 mg/dL 0.92 1.40(H) 1.78(H)  Sodium 135 - 145 mmol/L 137 141 139  Potassium 3.5 - 5.1 mmol/L 4.2 4.5 4.8  Chloride 98 - 111 mmol/L 105 107 104  CO2 22 - 32 mmol/L 21(L) 21(L) 18(L)  Calcium 8.9 - 10.3 mg/dL 10.5(H) 10.4(H) 10.0  Total Protein 6.5 - 8.1 g/dL - - 8.0  Total Bilirubin 0.3 - 1.2 mg/dL - - 0.7  Alkaline Phos 38 - 126 U/L - - 316(H)  AST 15 - 41 U/L - - 101(H)  ALT 0 - 44 U/L - - 50(H)    Lab Results  Component Value Date  WBC 4.0 12/16/2019   HGB 10.8 (L) 12/16/2019   HCT 31.4 (L) 12/16/2019   MCV 86.0 12/16/2019   PLT 72 (L) 12/16/2019   NEUTROABS 1.9 12/16/2019    CT Abdomen Pelvis Wo Contrast  Result Date:  11/28/2019 CLINICAL DATA:  Restaging small cell lung cancer. EXAM: CT CHEST, ABDOMEN AND PELVIS WITHOUT CONTRAST TECHNIQUE: Multidetector CT imaging of the chest, abdomen and pelvis was performed following the standard protocol without IV contrast. COMPARISON:  07/30/2019 FINDINGS: CT CHEST FINDINGS Cardiovascular: Normal heart size. No pericardial effusion. Aortic atherosclerosis lad and left circumflex coronary artery calcifications. Mediastinum/Nodes: Normal appearance of the thyroid gland. The trachea appears patent and is midline. Normal appearance of the esophagus. Right paratracheal lymph node measures 2.3 cm. Previously 0.7 cm. Lower right paratracheal lymph node measures 1.8 cm, image 24/2. Previously 0.7 cm no enlarged supraclavicular or axillary lymph nodes. Lungs/Pleura: No pleural effusion. No airspace consolidation, atelectasis or pneumothorax. Right upper lobe pulmonary nodules are again noted in are increased from previous exam. Peripheral spiculated right upper lobe lung nodule measures 1 cm, image 60/4. Central right upper lobe perihilar nodule measures 1.1 cm, image 63/4. New from previous exam. Adjacent nodule measures 2.0 cm, image 65/4. Also new from previous exam. Musculoskeletal: Multifocal areas of mixed lytic and sclerotic bone metastases are noted. The appearance is similar to previous exam. CT ABDOMEN PELVIS FINDINGS Hepatobiliary: Multifocal liver metastases have progressed from previous exam. Lesions are increased in size and number in the interval. For example, within lateral segment of left lobe of liver there is a 4.9 cm hypodense metastasis, image 54/2. Previously 2.1 cm. Dome of liver lesion measures 5.2 cm, image 46/2. Previously 1.8 cm segment 5 liver lesion measures 1.6 cm, image 63/2. Previously 0.8 cm. Lesion within segment 7 measures 2.2 cm, image 53/2. Previously 1.3 cm. Stones noted layering within the dependent portion of the gallbladder. No biliary ductal dilatation.  Pancreas: Unremarkable. No pancreatic ductal dilatation or surrounding inflammatory changes. Spleen: Normal in size without focal abnormality. Adrenals/Urinary Tract: Normal right adrenal gland. There is a solid nodule in the left adrenal gland measuring 2 cm, image 57/2. Previously this measured the same. No Mack mass or hydronephrosis identified. Urinary bladder unremarkable. Stomach/Bowel: Stomach is within normal limits. Appendix not confidently identified. Postop change involving the sigmoid colon is again noted, stable. No evidence of bowel wall thickening, distention, or inflammatory changes. Vascular/Lymphatic: Aortic atherosclerosis. No abdominopelvic adenopathy. Reproductive: Enlargement of the prostate gland is again noted. Other: No free fluid or fluid collections. Musculoskeletal: Heterogeneous bone mineralization within the bony pelvis and lumbar spine is similar to previous exam compatible with osseous metastasis. IMPRESSION: 1. Interval progression of liver metastases. 2. Increase in size and number of right upper lobe pulmonary nodules. New right paratracheal adenopathy 3. Stable appearance of multifocal mixed lytic and sclerotic bone metastases. 4. Aortic atherosclerosis. Coronary artery calcifications noted. Aortic Atherosclerosis (ICD10-I70.0). Electronically Signed   By: Kerby Moors M.D.   On: 11/28/2019 14:24   CT Chest Wo Contrast  Result Date: 11/28/2019 CLINICAL DATA:  Restaging small cell lung cancer. EXAM: CT CHEST, ABDOMEN AND PELVIS WITHOUT CONTRAST TECHNIQUE: Multidetector CT imaging of the chest, abdomen and pelvis was performed following the standard protocol without IV contrast. COMPARISON:  07/30/2019 FINDINGS: CT CHEST FINDINGS Cardiovascular: Normal heart size. No pericardial effusion. Aortic atherosclerosis lad and left circumflex coronary artery calcifications. Mediastinum/Nodes: Normal appearance of the thyroid gland. The trachea appears patent and is midline. Normal  appearance of the esophagus. Right  paratracheal lymph node measures 2.3 cm. Previously 0.7 cm. Lower right paratracheal lymph node measures 1.8 cm, image 24/2. Previously 0.7 cm no enlarged supraclavicular or axillary lymph nodes. Lungs/Pleura: No pleural effusion. No airspace consolidation, atelectasis or pneumothorax. Right upper lobe pulmonary nodules are again noted in are increased from previous exam. Peripheral spiculated right upper lobe lung nodule measures 1 cm, image 60/4. Central right upper lobe perihilar nodule measures 1.1 cm, image 63/4. New from previous exam. Adjacent nodule measures 2.0 cm, image 65/4. Also new from previous exam. Musculoskeletal: Multifocal areas of mixed lytic and sclerotic bone metastases are noted. The appearance is similar to previous exam. CT ABDOMEN PELVIS FINDINGS Hepatobiliary: Multifocal liver metastases have progressed from previous exam. Lesions are increased in size and number in the interval. For example, within lateral segment of left lobe of liver there is a 4.9 cm hypodense metastasis, image 54/2. Previously 2.1 cm. Dome of liver lesion measures 5.2 cm, image 46/2. Previously 1.8 cm segment 5 liver lesion measures 1.6 cm, image 63/2. Previously 0.8 cm. Lesion within segment 7 measures 2.2 cm, image 53/2. Previously 1.3 cm. Stones noted layering within the dependent portion of the gallbladder. No biliary ductal dilatation. Pancreas: Unremarkable. No pancreatic ductal dilatation or surrounding inflammatory changes. Spleen: Normal in size without focal abnormality. Adrenals/Urinary Tract: Normal right adrenal gland. There is a solid nodule in the left adrenal gland measuring 2 cm, image 57/2. Previously this measured the same. No Mack mass or hydronephrosis identified. Urinary bladder unremarkable. Stomach/Bowel: Stomach is within normal limits. Appendix not confidently identified. Postop change involving the sigmoid colon is again noted, stable. No evidence of  bowel wall thickening, distention, or inflammatory changes. Vascular/Lymphatic: Aortic atherosclerosis. No abdominopelvic adenopathy. Reproductive: Enlargement of the prostate gland is again noted. Other: No free fluid or fluid collections. Musculoskeletal: Heterogeneous bone mineralization within the bony pelvis and lumbar spine is similar to previous exam compatible with osseous metastasis. IMPRESSION: 1. Interval progression of liver metastases. 2. Increase in size and number of right upper lobe pulmonary nodules. New right paratracheal adenopathy 3. Stable appearance of multifocal mixed lytic and sclerotic bone metastases. 4. Aortic atherosclerosis. Coronary artery calcifications noted. Aortic Atherosclerosis (ICD10-I70.0). Electronically Signed   By: Kerby Moors M.D.   On: 11/28/2019 14:24    ASSESSMENT AND PLAN: 1.  Metastatic small cell lung cancer 2.  Failure to thrive, malignant cachexia 3.  Anemia 4.  Thrombocytopenia 5.  Chronic renal failure, resolved 6.  Intermittent hypercalcemia 7.  History of CVA 8.  Goals of care  -The patient appears stable this morning.  Attempting to arrange family meeting for later today to further discuss goals of care.  The patient is not a candidate for any additional chemotherapy for his small cell lung cancer.  We will continue supportive care for now. -Continue Megace for decreased appetite and cachexia -Hemoglobin and platelets are overall stable.  No transfusion is indicated. -Renal function is normal this morning.  Will monitor. -He has mild hypercalcemia which is overall stable.  He received Zometa in our office recently.  Will monitor. -Continue Eliquis for history of CVA. -We will plan for family meeting hopefully later today.  Would again recommend discharge home with hospice or possible [facility] placement.  The patient remains a full code.  We will continue ongoing discussions regarding CODE STATUS and disposition.    LOS: 3 days    Mikey Bussing, DNP, AGPCNP-BC, AOCNP 12/16/19  Addendum   I have seen the patient, examined him.  I agree with the assessment and and plan and have edited the notes.   Mr. Weible is intermittently confused, but able to answer most questions reasonably well.  He is very fatigued, has been in the bed most time.  He had a episode of fall last night without injury.   I met his daughter Jeremy Mack and son Jeremy Mack at his bedside.  I reviewed his diagnosis, treatment course, incurable nature of the cancer, and overall very poor prognosis, with anticipated life expectancy a few weeks, up to a month.  Patient is not a candidate for further more chemotherapy due to his poor performance status, his son and daughter voiced good understanding and agree with that.  We discussed the goal of care is palliative, to make sure he is comfortable, which both pt and his children agree. We again discussed CODE STATUS, after lengthy discussion, patient, his son and daughter all wanted him to be full code, but they agree to have limited (no more than a week) life support after resuscitation.  We discussed this condition.  Both patient and son and daughter are concerned about his care at home.  Patient confirms that he is married with Jeremy Mack.  We discussed the option of rehabilitation facility if he is able to participate physical therapy, versus home care with maximal support covered by his insurance.  I will order clinical therapy evaluation today, to see if he is a candidate for rehab.  I will also investigate if his insurance will cover home aide service, if he goes home.  I will contact his case manager. Pt and his children are interested in IVF at home and home acid service.   We also discussed healthcare power of attorney, patient wants his son Jeremy Mack to be his HCPA. I will get social work to get this done today.  I spent a total of 60 mins on his care today, >50% on face-to-face counseling.Truitt Merle  12/16/2019

## 2019-12-17 MED ORDER — SODIUM CHLORIDE 0.9 % IV SOLN: INTRAVENOUS | Status: DC

## 2019-12-17 NOTE — Progress Notes (Signed)
This chaplain briefly stepped in the Pt. room for a F/U spiritual care visit. The Pt. wife was with the Pt., both were resting quietly.

## 2019-12-17 NOTE — NC FL2 (Signed)
[place] LEVEL OF CARE SCREENING TOOL     IDENTIFICATION  Patient Name: Jeremy Mack Birthdate: 03-20-1946 Sex: male Admission Date (Current Location): 12/13/2019  [facility] [place] [facility]:  Herbalist and Address:  [facility],  [place], [place]      Provider Number: [id number]  Attending Physician Name and Address:  Truitt Merle, MD  Relative Name and Phone Number:  Emmit Oriley (son) [phone]    Current Level of Care: Hospital Recommended Level of Care: [place] Prior Approval Number:    Date Approved/Denied:   PASRR Number: [id number]  Discharge Plan: SNF    Current Diagnoses: Patient Active Problem List   Diagnosis Date Noted  . Metastatic lung cancer (metastasis from lung to other site) ([place]) 12/13/2019  . Failure to thrive in adult 12/13/2019  . Hypercalcemia 12/04/2019  . Tumor lysis syndrome 11/28/2019  . Acute ischemic stroke ([place]) 10/01/2019  . Primary malignant neoplasm of bronchus of left upper lobe ([place]) 06/06/2019  . Goals of care, counseling/discussion 06/06/2019  . Pancreatic abscess   . Malnutrition of moderate degree 06/14/2018  . Pancreatic mass   . Abnormal CT of the abdomen   . Abdominal pain 06/12/2018  . Leukocytosis 06/12/2018  . AKI (acute kidney injury) ([place]) 06/12/2018  . Medicare annual wellness visit, subsequent 09/09/2016  . Routine general medical examination at a health care facility 09/09/2016  . Hyperlipidemia 09/07/2015  . Neck stiffness 05/20/2015  . Hip pain, chronic 05/20/2015  . Essential hypertension 05/20/2015    Orientation RESPIRATION BLADDER Height & Weight     Self, Place  Normal Incontinent Weight: 60.2 kg Height:  6' (182.9 cm)(per patient)  BEHAVIORAL SYMPTOMS/MOOD NEUROLOGICAL BOWEL NUTRITION STATUS      Incontinent    AMBULATORY STATUS COMMUNICATION OF NEEDS Skin   Extensive Assist Verbally Normal                        Personal Care Assistance Level of Assistance  Bathing, Dressing Bathing Assistance: Limited assistance   Dressing Assistance: Limited assistance     Functional Limitations Info             SPECIAL CARE FACTORS FREQUENCY  PT (By licensed PT), OT (By licensed OT)     PT Frequency: 5 x weekly OT Frequency: 5 x weekly            Contractures Contractures Info: Not present    Additional Factors Info  Code Status, Allergies Code Status Info: Full Allergies Info: No known           Current Medications (12/17/2019):  This is the current hospital active medication list Current Facility-Administered Medications  Medication Dose Route Frequency Provider Last Rate Last Admin  . 0.9 %  sodium chloride infusion   Intravenous Continuous Curcio, Kristin R, NP      . acetaminophen (TYLENOL) tablet 650 mg  650 mg Oral Q6H PRN Truitt Merle, MD       Or  . acetaminophen (TYLENOL) suppository 650 mg  650 mg Rectal Q6H PRN Truitt Merle, MD      . allopurinol (ZYLOPRIM) tablet 300 mg  300 mg Oral BID Truitt Merle, MD   300 mg at 12/17/19 0958  . alum & mag hydroxide-simeth (MAALOX/MYLANTA) 200-200-20 MG/5ML suspension 60 mL  60 mL Oral Q4H PRN Truitt Merle, MD      . amLODipine (NORVASC) tablet 10 mg  10 mg Oral Daily  Truitt Merle, MD   10 mg at 12/17/19 [phone]  . antiseptic oral rinse (BIOTENE) solution 15 mL  15 mL Topical PRN Truitt Merle, MD      . apixaban Arne Cleveland) tablet 5 mg  5 mg Oral BID Truitt Merle, MD   5 mg at 12/17/19 0958  . aspirin chewable tablet 81 mg  81 mg Oral Daily Truitt Merle, MD   81 mg at 12/17/19 0958  . chlorhexidine (PERIDEX) 0.12 % solution 15 mL  15 mL Mouth Rinse BID Truitt Merle, MD   15 mL at 12/17/19 0958  . Chlorhexidine Gluconate Cloth 2 % PADS 6 each  6 each Topical Daily Truitt Merle, MD      . glycopyrrolate (ROBINUL) tablet 1 mg  1 mg Oral Q4H PRN Truitt Merle, MD       Or  . glycopyrrolate (ROBINUL) injection 0.2 mg  0.2 mg Subcutaneous Q4H PRN Truitt Merle, MD       Or  .  glycopyrrolate (ROBINUL) injection 0.2 mg  0.2 mg Intravenous Q4H PRN Truitt Merle, MD      . guaiFENesin-dextromethorphan (ROBITUSSIN DM) 100-10 MG/5ML syrup 10 mL  10 mL Oral Q4H PRN Truitt Merle, MD      . haloperidol (HALDOL) tablet 0.5 mg  0.5 mg Oral Q4H PRN Truitt Merle, MD       Or  . haloperidol (HALDOL) 2 MG/ML solution 0.5 mg  0.5 mg Sublingual Q4H PRN Truitt Merle, MD       Or  . haloperidol lactate (HALDOL) injection 0.5 mg  0.5 mg Intravenous Q4H PRN Truitt Merle, MD   0.5 mg at 12/17/19 0309  . lactose free nutrition (BOOST PLUS) liquid 237 mL  237 mL Oral TID WC Truitt Merle, MD   237 mL at 12/16/19 1329  . MEDLINE mouth rinse  15 mL Mouth Rinse q12n4p Truitt Merle, MD   15 mL at 12/15/19 1600  . megestrol (MEGACE) 400 MG/10ML suspension 800 mg  800 mg Oral Daily Truitt Merle, MD   800 mg at 12/17/19 0958  . morphine 4 MG/ML injection 2 mg  2 mg Intravenous Q2H PRN Truitt Merle, MD      . multivitamin with minerals tablet 1 tablet  1 tablet Oral Daily Truitt Merle, MD   1 tablet at 12/17/19 [phone]  . ondansetron (ZOFRAN) tablet 4-8 mg  4-8 mg Oral Q8H PRN Truitt Merle, MD       Or  . ondansetron (ZOFRAN-ODT) disintegrating tablet 4-8 mg  4-8 mg Oral Q8H PRN Truitt Merle, MD       Or  . ondansetron [facility]) injection 4 mg  4 mg Intravenous Q8H PRN Truitt Merle, MD       Or  . ondansetron (ZOFRAN) 8 mg in sodium chloride 0.9 % 50 mL IVPB  8 mg Intravenous Q8H PRN Truitt Merle, MD      . ondansetron (ZOFRAN-ODT) disintegrating tablet 4 mg  4 mg Oral Q6H PRN Truitt Merle, MD       Or  . ondansetron [facility]) injection 4 mg  4 mg Intravenous Q6H PRN Truitt Merle, MD      . pantoprazole (PROTONIX) EC tablet 20 mg  20 mg Oral Daily Truitt Merle, MD   20 mg at 12/17/19 0958  . polyvinyl alcohol (LIQUIFILM TEARS) 1.4 % ophthalmic solution 1 drop  1 drop Both Eyes QID PRN Truitt Merle, MD      . senna-docusate (Senokot-S) tablet 1 tablet  1 tablet Oral  QHS PRN Truitt Merle, MD      . traMADol Veatrice Bourbon) tablet 50 mg  50 mg Oral Q6H PRN Truitt Merle,  MD         Discharge Medications: Please see discharge summary for a list of discharge medications.  Relevant Imaging Results:  Relevant Lab Results:   Additional Information ss # [id number]  Tikita Mabee, Marjie Skiff, RN

## 2019-12-17 NOTE — Care Management Important Message (Signed)
Important Message  Patient Details IM Letter given to Marney Doctor RN Case Manager to present to the Patient Name: Jeanluc Wegman MRN: [id number] Date of Birth: [birth year]   Medicare Important Message Given:  Yes     Kerin Salen 12/17/2019, 11:24 AM

## 2019-12-17 NOTE — TOC Initial Note (Signed)
Transition of Care [facility]) - Initial/Assessment Note    Patient Details  Name: Jeremy Mack MRN: [id number] Date of Birth: [birth year]  Transition of Care [facility]) CM/SW Contact:    Lynnell Catalan, RN Phone Number: 12/17/2019, 12:38 PM  Clinical Narrative:                 [facility] consult for DC planning. This CM contacted pt son Jeremy Mack for DC planning. Jeremy Mack states that pt and family would like pt to go to rehab to get stronger before coming back home. This CM expressed concern of pt diagnosis and pt ability to participate in PT 5 times weekly at [facility]. Tregan states that he thinks pt would like to try and do rehab. Permission granted by Awanda Mink to fax out pt FL2. TOC will continue to follow and give bed offers when they are available.  Expected Discharge Plan: Skilled Nursing Facility Barriers to Discharge: Continued Medical Work up   Patient Goals and CMS Choice        Expected Discharge Plan and Services Expected Discharge Plan: [place]   Discharge Planning Services: CM Consult   Living arrangements for the past 2 months: Single Family Home                                      Prior Living Arrangements/Services Living arrangements for the past 2 months: Single Family Home Lives with:: Spouse Patient language and need for interpreter reviewed:: Yes        Need for Family Participation in Patient Care: Yes (Comment) Care giver support system in place?: Yes (comment)      Activities of Daily Living Home Assistive Devices/Equipment: None(per daughter) ADL Screening (condition at time of admission) Patient's cognitive ability adequate to safely complete daily activities?: Yes(per daughter, just lethargic. ) Is the patient deaf or have difficulty hearing?: No Does the patient have difficulty seeing, even when wearing glasses/contacts?: No Does the patient have difficulty concentrating, remembering, or making decisions?: Yes Patient able to express need for  assistance with ADLs?: No Does the patient have difficulty dressing or bathing?: Yes Independently performs ADLs?: No Communication: Independent Dressing (OT): Needs assistance Is this a change from baseline?: Change from baseline, expected to last >3 days Grooming: Needs assistance Is this a change from baseline?: Change from baseline, expected to last >3 days Feeding: Needs assistance Is this a change from baseline?: Change from baseline, expected to last >3 days Bathing: Needs assistance Is this a change from baseline?: Change from baseline, expected to last >3 days Toileting: Needs assistance Is this a change from baseline?: Change from baseline, expected to last >3days In/Out Bed: Needs assistance Is this a change from baseline?: Change from baseline, expected to last >3 days Walks in Home: Needs assistance Is this a change from baseline?: Change from baseline, expected to last >3 days Does the patient have difficulty walking or climbing stairs?: Yes Weakness of Legs: Both Weakness of Arms/Hands: Both  Permission Sought/Granted   Permission granted to share information with : Yes, Verbal Permission Granted              Emotional Assessment       Orientation: : Oriented to Self, Oriented to Place      Admission diagnosis:  Metastatic lung cancer (metastasis from lung to other site) ([place]) [C34.90] Failure to thrive in adult [R62.7] Primary malignant neoplasm of lung metastatic to  other site, unspecified laterality [facility]) [C34.90] Patient Active Problem List   Diagnosis Date Noted  . Metastatic lung cancer (metastasis from lung to other site) ([place]) 12/13/2019  . Failure to thrive in adult 12/13/2019  . Hypercalcemia 12/04/2019  . Tumor lysis syndrome 11/28/2019  . Acute ischemic stroke ([place]) 10/01/2019  . Primary malignant neoplasm of bronchus of left upper lobe ([place]) 06/06/2019  . Goals of care, counseling/discussion 06/06/2019  . Pancreatic abscess   .  Malnutrition of moderate degree 06/14/2018  . Pancreatic mass   . Abnormal CT of the abdomen   . Abdominal pain 06/12/2018  . Leukocytosis 06/12/2018  . AKI (acute kidney injury) ([place]) 06/12/2018  . Medicare annual wellness visit, subsequent 09/09/2016  . Routine general medical examination at a health care facility 09/09/2016  . Hyperlipidemia 09/07/2015  . Neck stiffness 05/20/2015  . Hip pain, chronic 05/20/2015  . Essential hypertension 05/20/2015   PCP:  [name], [place] [facility]:   [id number] - [place], [place] [address] Phone: [phone] Fax: [phone]  [facility] [place], [place] - [address] [place] [address] Phone: [phone] Fax: [place], [place] - [place] [address] Phone: [phone] Fax: [phone]     Social Determinants of Health (SDOH) Interventions    Readmission Risk Interventions Readmission Risk Prevention Plan 12/17/2019  Transportation Screening Complete  PCP or Specialist Appt within 3-5 Days Complete  HRI or Home Care Consult Complete  Social Work Consult for [place] Planning/Counseling Complete  Palliative Care Screening Complete  Medication Review Press photographer) Complete  Some recent data might be hidden

## 2019-12-17 NOTE — Progress Notes (Addendum)
HEMATOLOGY-ONCOLOGY PROGRESS NOTE  SUBJECTIVE: The patient was attempting to climb out of bed this morning at the time of my arrival.  He stated that he had to get up and get dressed to go to an appointment.  He reoriented very quickly.  He denies pain this morning.  Denies chest pain or shortness of breath.  Denies abdominal pain, nausea, vomiting.  I's and O's not accurately recorded.  He will intake remains low.  Oncology History Overview Note  Cancer Staging Small cell lung cancer ([place]) Staging form: Lung, AJCC 8th Edition - Clinical: Stage IVB (cTX, cN2, pM1c) - Signed by Truitt Merle, MD on 06/06/2019    Primary malignant neoplasm of bronchus of left upper lobe ([place])  07/11/2018 Imaging   CT AP 07/11/18 IMPRESSION: 1. Significantly improved appearance of the pancreas and peripancreatic tissues. Improved to resolved peripancreatic edema with resolved and significantly improved peripancreatic lesions. The remaining lesion was felt to represent abscess on prior endoscopic ultrasound sampling. Pseudocyst or infected pseudocyst would be the favored imaging diagnosis. 2. Persistent but improved mass effect upon the inferior aspect of the portal vein and superior aspect of the superior mesenteric vein. 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 3. Aortic Atherosclerosis (ICD10-I70.0). Bilateral common iliac artery ectasia. 4. A left adrenal nodule is indeterminate on precontrast imaging and may have decreased in size since the prior. Consider dedicated adrenal protocol CT. This could either be performed in 6 months to confirm size stability or more acutely, depending on clinical concern. 5. A splenic lesion is indeterminate and can be re-evaluated on follow-up. 6. Hepatic morphology suspicious for cirrhosis. 7. Cholelithiasis. 8.  Emphysema (ICD10-J43.9).   05/01/2019 Imaging   CT AP 05/01/19  IMPRESSION: 1. New diffuse liver metastases. 2. New mild  retroperitoneal lymphadenopathy, consistent with metastatic disease. 3. Resolution of previously seen pancreatic mass since previous study. No radiographic evidence of acute pancreatitis. 4. Stable left adrenal mass, consistent with adrenal adenoma. 5. Stable markedly enlarged prostate and findings of chronic bladder outlet obstruction. 6. Colonic diverticulosis. No radiographic evidence of diverticulitis. 7. Cholelithiasis.   Aortic Atherosclerosis (ICD10-I70.0).   05/28/2019 PET scan   PET 05/28/19  IMPRESSION: 1. Examination is positive for widespread FDG avid liver metastases. Liver lesions are too numerous to count. 2. Hypermetabolic mediastinal and upper abdominal nodal metastases. 3. Left upper lobe and right upper lobe hypermetabolic pulmonary nodules consistent with pulmonary metastasis. 4. Innumerable liver metastases noted within the axial and proximal appendicular skeleton. Bone lesions are too numerous to count. Some of these have a corresponding lytic lesion on CT images. Others appear occult on the corresponding CT images. 5. Solid hyperdense left adrenal nodule is not significantly changed from 05/01/2019 and exhibits mild FDG uptake. Indeterminate. 6. Prostate gland enlargement. Focal area of increased uptake within the left posterior gland is indeterminate.   06/04/2019 Initial Biopsy   Biopsy 06/04/19  Diagnosis Liver, needle/core biopsy - SMALL CELL CARCINOMA. SEE COMMENT.   06/06/2019 Initial Diagnosis   Small cell lung cancer ([place])   06/06/2019 Cancer Staging   Staging form: Lung, AJCC 8th Edition - Clinical: Stage IVB (cTX, cN2, pM1c) - Signed by Truitt Merle, MD on 06/06/2019   06/10/2019 - 11/04/2019 Chemotherapy   Carboplatin Day 1 and [place] day 1-3 with immunotherapy atezolizumab (Tecentriq) on day 1, every 3 weeks for 6 cycles starting 06/10/19. He is now on maintenance Tecentriq every 3 weeks. Stopped after 11/04/19 due to disease progression.    06/14/2019  Imaging    MRI  brain 06/14/19  IMPRESSION: No brain metastases. Chronic small-vessel ischemic changes of the pons and cerebral hemispheric white matter.   Probable metastatic lesions within the upper cervical spine as seen. No sign of spinal canal encroachment.   07/30/2019 Imaging   CT CAP W Contrast  IMPRESSION: 1. Marked interval decrease in size of the hypermetabolic bilateral pulmonary nodules seen on previous PET-CT. 2. Clear interval decrease in the numerous hepatic metastases. 3. Interval resolution of mediastinal and upper abdominal lymphadenopathy seen previously. 4. Stable left adrenal nodule. This shows low level FDG uptake on prior PET-CT and is indeterminate. Continued attention on follow-up recommended. 5. No new or progressive findings to suggest disease progression. 6. Stable hypoenhancing splenic lesion. Attention on follow-up recommended. 7. Prostatomegaly. 8.  Aortic Atherosclerois (ICD10-170.0)     11/07/2019 - 11/21/2019 Radiation Therapy   Radiation to brain with Dr. Lisbeth Renshaw 11/07/19-11/21/19   11/28/2019 Imaging   CT CAP WO Contrast  IMPRESSION: 1. Interval progression of liver metastases. 2. Increase in size and number of right upper lobe pulmonary nodules. New right paratracheal adenopathy 3. Stable appearance of multifocal mixed lytic and sclerotic bone metastases. 4. Aortic atherosclerosis. Coronary artery calcifications noted.   Aortic Atherosclerosis (ICD10-I70.0).   12/04/2019 - 12/04/2019 Chemotherapy   Second-line Taxol starting 12/04/19. Only received 1 dose, will proceed with hospice.       REVIEW OF SYSTEMS:   Constitutional: Denies fevers, chills  Respiratory: Denies cough, dyspnea or wheezes Cardiovascular: Denies palpitation, chest discomfort Gastrointestinal:  Denies nausea, heartburn or change in bowel habits Skin: Denies abnormal skin rashes Lymphatics: Denies new lymphadenopathy or easy bruising Neurological:Denies numbness,  tingling or new weaknesses Behavioral/Psych: Mood is stable, no new changes  Extremities: No lower extremity edema All other systems were reviewed with the patient and are negative.  I have reviewed the past medical history, past surgical history, social history and family history with the patient and they are unchanged from previous note.   PHYSICAL EXAMINATION: ECOG PERFORMANCE STATUS: 3 - Symptomatic, >50% confined to bed  Vitals:   12/17/19 0600 12/17/19 0754  BP: 115/79 112/69  Pulse: 100 (!) 105  Resp:  18  Temp: 97.7 F (36.5 C) 98.3 F (36.8 C)  SpO2: 100% 96%   Filed Weights   12/14/19 0151  Weight: 132 lb 11.5 oz (60.2 kg)    Intake/Output from previous day: 01/18 0701 - 01/19 0700 In: -  Out: 100 [Urine:100]  GENERAL:alert, chronically ill-appearing, no distress OROPHARYNX: No thrush or mucositis LUNGS: clear to auscultation and percussion with normal breathing effort HEART: regular rate & rhythm and no murmurs and no lower extremity edema ABDOMEN:abdomen soft, non-tender and normal bowel sounds Musculoskeletal:no cyanosis of digits and no clubbing  NEURO: Alert, oriented to person, at times disoriented to place and time, with fluent speech, no focal motor/sensory deficits  LABORATORY DATA:  I have reviewed the data as listed CMP Latest Ref Rng & Units 12/16/2019 12/13/2019 12/11/2019  Glucose 70 - 99 mg/dL 99 84 121(H)  BUN 8 - 23 mg/dL 12 33(H) 50(H)  Creatinine 0.61 - 1.24 mg/dL 0.92 1.40(H) 1.78(H)  Sodium 135 - 145 mmol/L 137 141 139  Potassium 3.5 - 5.1 mmol/L 4.2 4.5 4.8  Chloride 98 - 111 mmol/L 105 107 104  CO2 22 - 32 mmol/L 21(L) 21(L) 18(L)  Calcium 8.9 - 10.3 mg/dL 10.5(H) 10.4(H) 10.0  Total Protein 6.5 - 8.1 g/dL - - 8.0  Total Bilirubin 0.3 - 1.2 mg/dL - - 0.7  Alkaline Phos  38 - 126 U/L - - 316(H)  AST 15 - 41 U/L - - 101(H)  ALT 0 - 44 U/L - - 50(H)    Lab Results  Component Value Date   WBC 4.0 12/16/2019   HGB 10.8 (L) 12/16/2019    HCT 31.4 (L) 12/16/2019   MCV 86.0 12/16/2019   PLT 72 (L) 12/16/2019   NEUTROABS 1.9 12/16/2019    CT Abdomen Pelvis Wo Contrast  Result Date: 11/28/2019 CLINICAL DATA:  Restaging small cell lung cancer. EXAM: CT CHEST, ABDOMEN AND PELVIS WITHOUT CONTRAST TECHNIQUE: Multidetector CT imaging of the chest, abdomen and pelvis was performed following the standard protocol without IV contrast. COMPARISON:  07/30/2019 FINDINGS: CT CHEST FINDINGS Cardiovascular: Normal heart size. No pericardial effusion. Aortic atherosclerosis lad and left circumflex coronary artery calcifications. Mediastinum/Nodes: Normal appearance of the thyroid gland. The trachea appears patent and is midline. Normal appearance of the esophagus. Right paratracheal lymph node measures 2.3 cm. Previously 0.7 cm. Lower right paratracheal lymph node measures 1.8 cm, image 24/2. Previously 0.7 cm no enlarged supraclavicular or axillary lymph nodes. Lungs/Pleura: No pleural effusion. No airspace consolidation, atelectasis or pneumothorax. Right upper lobe pulmonary nodules are again noted in are increased from previous exam. Peripheral spiculated right upper lobe lung nodule measures 1 cm, image 60/4. Central right upper lobe perihilar nodule measures 1.1 cm, image 63/4. New from previous exam. Adjacent nodule measures 2.0 cm, image 65/4. Also new from previous exam. Musculoskeletal: Multifocal areas of mixed lytic and sclerotic bone metastases are noted. The appearance is similar to previous exam. CT ABDOMEN PELVIS FINDINGS Hepatobiliary: Multifocal liver metastases have progressed from previous exam. Lesions are increased in size and number in the interval. For example, within lateral segment of left lobe of liver there is a 4.9 cm hypodense metastasis, image 54/2. Previously 2.1 cm. Dome of liver lesion measures 5.2 cm, image 46/2. Previously 1.8 cm segment 5 liver lesion measures 1.6 cm, image 63/2. Previously 0.8 cm. Lesion within segment  7 measures 2.2 cm, image 53/2. Previously 1.3 cm. Stones noted layering within the dependent portion of the gallbladder. No biliary ductal dilatation. Pancreas: Unremarkable. No pancreatic ductal dilatation or surrounding inflammatory changes. Spleen: Normal in size without focal abnormality. Adrenals/Urinary Tract: Normal right adrenal gland. There is a solid nodule in the left adrenal gland measuring 2 cm, image 57/2. Previously this measured the same. No kidney mass or hydronephrosis identified. Urinary bladder unremarkable. Stomach/Bowel: Stomach is within normal limits. Appendix not confidently identified. Postop change involving the sigmoid colon is again noted, stable. No evidence of bowel wall thickening, distention, or inflammatory changes. Vascular/Lymphatic: Aortic atherosclerosis. No abdominopelvic adenopathy. Reproductive: Enlargement of the prostate gland is again noted. Other: No free fluid or fluid collections. Musculoskeletal: Heterogeneous bone mineralization within the bony pelvis and lumbar spine is similar to previous exam compatible with osseous metastasis. IMPRESSION: 1. Interval progression of liver metastases. 2. Increase in size and number of right upper lobe pulmonary nodules. New right paratracheal adenopathy 3. Stable appearance of multifocal mixed lytic and sclerotic bone metastases. 4. Aortic atherosclerosis. Coronary artery calcifications noted. Aortic Atherosclerosis (ICD10-I70.0). Electronically Signed   By: Kerby Moors M.D.   On: 11/28/2019 14:24   CT Chest Wo Contrast  Result Date: 11/28/2019 CLINICAL DATA:  Restaging small cell lung cancer. EXAM: CT CHEST, ABDOMEN AND PELVIS WITHOUT CONTRAST TECHNIQUE: Multidetector CT imaging of the chest, abdomen and pelvis was performed following the standard protocol without IV contrast. COMPARISON:  07/30/2019 FINDINGS: CT CHEST FINDINGS Cardiovascular:  Normal heart size. No pericardial effusion. Aortic atherosclerosis lad and left  circumflex coronary artery calcifications. Mediastinum/Nodes: Normal appearance of the thyroid gland. The trachea appears patent and is midline. Normal appearance of the esophagus. Right paratracheal lymph node measures 2.3 cm. Previously 0.7 cm. Lower right paratracheal lymph node measures 1.8 cm, image 24/2. Previously 0.7 cm no enlarged supraclavicular or axillary lymph nodes. Lungs/Pleura: No pleural effusion. No airspace consolidation, atelectasis or pneumothorax. Right upper lobe pulmonary nodules are again noted in are increased from previous exam. Peripheral spiculated right upper lobe lung nodule measures 1 cm, image 60/4. Central right upper lobe perihilar nodule measures 1.1 cm, image 63/4. New from previous exam. Adjacent nodule measures 2.0 cm, image 65/4. Also new from previous exam. Musculoskeletal: Multifocal areas of mixed lytic and sclerotic bone metastases are noted. The appearance is similar to previous exam. CT ABDOMEN PELVIS FINDINGS Hepatobiliary: Multifocal liver metastases have progressed from previous exam. Lesions are increased in size and number in the interval. For example, within lateral segment of left lobe of liver there is a 4.9 cm hypodense metastasis, image 54/2. Previously 2.1 cm. Dome of liver lesion measures 5.2 cm, image 46/2. Previously 1.8 cm segment 5 liver lesion measures 1.6 cm, image 63/2. Previously 0.8 cm. Lesion within segment 7 measures 2.2 cm, image 53/2. Previously 1.3 cm. Stones noted layering within the dependent portion of the gallbladder. No biliary ductal dilatation. Pancreas: Unremarkable. No pancreatic ductal dilatation or surrounding inflammatory changes. Spleen: Normal in size without focal abnormality. Adrenals/Urinary Tract: Normal right adrenal gland. There is a solid nodule in the left adrenal gland measuring 2 cm, image 57/2. Previously this measured the same. No kidney mass or hydronephrosis identified. Urinary bladder unremarkable. Stomach/Bowel:  Stomach is within normal limits. Appendix not confidently identified. Postop change involving the sigmoid colon is again noted, stable. No evidence of bowel wall thickening, distention, or inflammatory changes. Vascular/Lymphatic: Aortic atherosclerosis. No abdominopelvic adenopathy. Reproductive: Enlargement of the prostate gland is again noted. Other: No free fluid or fluid collections. Musculoskeletal: Heterogeneous bone mineralization within the bony pelvis and lumbar spine is similar to previous exam compatible with osseous metastasis. IMPRESSION: 1. Interval progression of liver metastases. 2. Increase in size and number of right upper lobe pulmonary nodules. New right paratracheal adenopathy 3. Stable appearance of multifocal mixed lytic and sclerotic bone metastases. 4. Aortic atherosclerosis. Coronary artery calcifications noted. Aortic Atherosclerosis (ICD10-I70.0). Electronically Signed   By: Kerby Moors M.D.   On: 11/28/2019 14:24    ASSESSMENT AND PLAN: 1.  Metastatic small cell lung cancer 2.  Failure to thrive, malignant cachexia 3.  Anemia 4.  Thrombocytopenia 5.  Chronic renal failure, resolved 6.  Intermittent hypercalcemia 7.  History of CVA 8.  Goals of care  -The patient appears stable this morning. The patient is not a candidate for any additional chemotherapy for his small cell lung cancer.  We will continue supportive care for now. -Continue Megace for decreased appetite and cachexia -We will give 500 cc of normal saline over 2 hours due to decreased p.o. intake -We will recheck CBC in [facility] in the morning -He has mild hypercalcemia which is overall stable.  He received Zometa in our office recently.  Will monitor. -Continue Eliquis for history of CVA. -Per family meeting yesterday, the family is requesting SNF placement for rehabilitation versus home care with maximal support/home aide.  The patient's family would also like IV fluids in the home.  PT evaluation completed  on 12/16/2019 and have recommended  SNF versus 24-hour able-bodied care.  I have discussed this with case manager who will look into our options.  She will also reach out to the family to notify them of their options.  The patient remains a full code per the family request.  They have agreed to have a limited (no more than a week) life support after resuscitation.  I called the patient's son, Edrees, and updated him about our visit today.   LOS: 4 days   Mikey Bussing, DNP, AGPCNP-BC, AOCNP 12/17/19   Addendum  I have seen the patient. I agree with the assessment and and plan and have edited the notes.   Pt is clinically stable, waiting SNF placement. Continue supportive care.   Truitt Merle  12/17/2019

## 2019-12-18 ENCOUNTER — Ambulatory Visit: Payer: Medicare PPO | Admitting: Nurse Practitioner

## 2019-12-18 ENCOUNTER — Telehealth: Payer: Self-pay

## 2019-12-18 ENCOUNTER — Other Ambulatory Visit: Payer: Medicare PPO

## 2019-12-18 ENCOUNTER — Ambulatory Visit: Payer: Medicare PPO

## 2019-12-18 LAB — CBC WITH DIFFERENTIAL/PLATELET
Abs Immature Granulocytes: 0.34 10*3/uL — ABNORMAL HIGH (ref 0.00–0.07)
Basophils Absolute: 0 10*3/uL (ref 0.0–0.1)
Basophils Relative: 1 %
Eosinophils Absolute: 0 10*3/uL (ref 0.0–0.5)
Eosinophils Relative: 0 %
HCT: 35.2 % — ABNORMAL LOW (ref 39.0–52.0)
Hemoglobin: 11.6 g/dL — ABNORMAL LOW (ref 13.0–17.0)
Immature Granulocytes: 6 %
Lymphocytes Relative: 32 %
Lymphs Abs: 1.8 10*3/uL (ref 0.7–4.0)
MCH: 29.1 pg (ref 26.0–34.0)
MCHC: 33 g/dL (ref 30.0–36.0)
MCV: 88.2 fL (ref 80.0–100.0)
Monocytes Absolute: 0.4 10*3/uL (ref 0.1–1.0)
Monocytes Relative: 8 %
Neutro Abs: 2.9 10*3/uL (ref 1.7–7.7)
Neutrophils Relative %: 53 %
Platelets: 75 10*3/uL — ABNORMAL LOW (ref 150–400)
RBC: 3.99 MIL/uL — ABNORMAL LOW (ref 4.22–5.81)
RDW: 14.9 % (ref 11.5–15.5)
WBC: 5.5 10*3/uL (ref 4.0–10.5)
nRBC: 1.3 % — ABNORMAL HIGH (ref 0.0–0.2)

## 2019-12-18 LAB — COMPREHENSIVE METABOLIC PANEL
ALT: 46 U/L — ABNORMAL HIGH (ref 0–44)
AST: 70 U/L — ABNORMAL HIGH (ref 15–41)
Albumin: 3.4 g/dL — ABNORMAL LOW (ref 3.5–5.0)
Alkaline Phosphatase: 275 U/L — ABNORMAL HIGH (ref 38–126)
Anion gap: 12 (ref 5–15)
BUN: 9 mg/dL (ref 8–23)
CO2: 20 mmol/L — ABNORMAL LOW (ref 22–32)
Calcium: 10.8 mg/dL — ABNORMAL HIGH (ref 8.9–10.3)
Chloride: 104 mmol/L (ref 98–111)
Creatinine, Ser: 0.84 mg/dL (ref 0.61–1.24)
GFR calc Af Amer: >60 mL/min (ref >60)
GFR calc non Af Amer: >60 mL/min (ref >60)
Glucose, Bld: 102 mg/dL — ABNORMAL HIGH (ref 70–99)
Potassium: 3.8 mmol/L (ref 3.5–5.1)
Sodium: 136 mmol/L (ref 135–145)
Total Bilirubin: 0.7 mg/dL (ref 0.3–1.2)
Total Protein: 7.1 g/dL (ref 6.5–8.1)

## 2019-12-18 NOTE — Progress Notes (Signed)
Physical Therapy Treatment Patient Details Name: Jeremy Mack MRN: [id number] DOB: [birth year] Today's Date: 12/18/2019    History of Present Illness Pt is 74 yo male with metastatic small cell lung cancer who was brought to ED for failure to thrive. Reviewed oncology notes and pt with poor prognosis with life expectancy few weeks to a month.  Per MD notes home with hospice vs [place] was recommended but family wants to pursue rehab at this time if pt able to participate vs home with maximal support covered by insurance.    PT Comments    Pt in bed.  Overall Cognitive Status: Within Functional Limits for tasks assessed.  Assisted OOB to amb.  General bed mobility comments: [place] and back to bed due to weakness/fatigue.  General transfer comment: 25% VC's on proper hand placement esp with stand to sit. General Gait Details: decreased amb distance due to increased c/o weakness/fatigue.  C/o R knee "weak".  Slow shuffed steps.  Recliner forllowing for safety.  Recliner was needed to get pt back to his room. Assisted back to bed per pt request.    Follow Up Recommendations  SNF;Supervision/Assistance - 24 hour     Equipment Recommendations  None recommended by PT    Recommendations for Other Services       Precautions / Restrictions Precautions Precautions: Fall Restrictions Weight Bearing Restrictions: No    Mobility  Bed Mobility Overal bed mobility: Needs Assistance Bed Mobility: Supine to Sit;Sit to Supine     Supine to sit: Min assist Sit to supine: Min assist   General bed mobility comments: [place] and back to bed due to weakness/fatigue  Transfers Overall transfer level: Needs assistance Equipment used: Rolling walker (2 wheeled) Transfers: Sit to/from [facility] Sit to Stand: Min assist Stand pivot transfers: Min assist       General transfer comment: 25% VC's on proper hand placement esp with stand to  sit.  Ambulation/Gait Ambulation/Gait assistance: Min assist Gait Distance (Feet): 22 Feet Assistive device: Rolling walker (2 wheeled) Gait Pattern/deviations: Decreased stride length;Narrow base of support Gait velocity: decreased   General Gait Details: decreased amb distance due to increased c/o weakness/fatigue.  C/o R knee "weak".  Slow shuffed steps.  Recliner forllowing for safety.  Recliner was needed to get pt back to his room.   Stairs             Wheelchair Mobility    Modified Rankin (Stroke Patients Only)       Balance                                            Cognition Arousal/Alertness: Awake/alert Behavior During Therapy: Flat affect Overall Cognitive Status: Within Functional Limits for tasks assessed                                 General Comments: following all functional commands and engaging in conversation but needed assistance dialing phone as he tried to use nurses call to do so      Exercises      General Comments        Pertinent Vitals/Pain Pain Assessment: No/denies pain    Home Living  Prior Function            PT Goals (current goals can now be found in the care plan section) Progress towards PT goals: Progressing toward goals    Frequency    Min 3X/week      PT Plan Current plan remains appropriate    Co-evaluation              AM-PAC PT "6 Clicks" Mobility   Outcome Measure  Help needed turning from your back to your side while in a flat bed without using bedrails?: A Little Help needed moving from lying on your back to sitting on the side of a flat bed without using bedrails?: A Little Help needed moving to and from a bed to a chair (including a wheelchair)?: A Little Help needed standing up from a chair using your arms (e.g., wheelchair or bedside chair)?: A Little Help needed to walk in hospital room?: A Little Help needed climbing 3-5  steps with a railing? : A Lot 6 Click Score: 17    End of Session Equipment Utilized During Treatment: Gait belt Activity Tolerance: Patient limited by fatigue Patient left: in bed;with bed alarm set;with call bell/phone within reach Nurse Communication: Mobility status PT Visit Diagnosis: Unsteadiness on feet (R26.81);History of falling (Z91.81);Muscle weakness (generalized) (M62.81)     Time: 4818-5631 PT Time Calculation (min) (ACUTE ONLY): 14 min  Charges:  $Gait Training: 8-22 mins                     Rica Koyanagi  PTA Acute  Rehabilitation Services Pager      [phone] Office      [phone]

## 2019-12-18 NOTE — Progress Notes (Addendum)
Jeremy Mack   DOB:26-Dec-1945   IW#:[id number]   SNK#:[id number]  Medical Oncology follow up note   Chief complain: I want to go home   Subjective: Patient is clinically stable, denies any significant pain or other discomfort.  Per his nurse, he has been eating and drinking well, but has not been out of bed due to the risk of fall. He sometime seems to be slightly confused, but able to answer all my questions appropriately.  His wife was with him this morning, but has left when I saw him. Pt told me he wants to go home today.    Objective:  Vitals:   12/18/19 0524 12/18/19 0753  BP: 126/88 125/88  Pulse: (!) 102 (!) 101  Resp: 15 20  Temp: 98.4 F (36.9 C) 97.7 F (36.5 C)  SpO2: 100% 100%    Body mass index is 18 kg/m.  Intake/Output Summary (Last 24 hours) at 12/18/2019 1438 Last data filed at 12/18/2019 1119 Gross per 24 hour  Intake 951 ml  Output 1150 ml  Net -199 ml     Sclerae unicteric  Oropharynx clear  No peripheral adenopathy  Lungs clear -- no rales or rhonchi  Heart regular rate and rhythm  Abdomen benign  MSK no focal spinal tenderness, no peripheral edema  Neuro nonfocal    CBG (last 3)  No results for input(s): GLUCAP in the last 72 hours.   Labs:  Urine Studies No results for input(s): UHGB, CRYS in the last 72 hours.  Invalid input(s): UACOL, UAPR, USPG, UPH, UTP, UGL, UKET, UBIL, UNIT, UROB, ULEU, UEPI, UWBC, URBC, UBAC, CAST, [place], [place]  Basic Metabolic Panel: Recent Labs  Lab 12/13/19 1642 12/13/19 1642 12/16/19 0532 12/18/19 0435  NA 141  --  137 136  K 4.5   < > 4.2 3.8  CL 107  --  105 104  CO2 21*  --  21* 20*  GLUCOSE 84  --  99 102*  BUN 33*  --  12 9  CREATININE 1.40*  --  0.92 0.84  CALCIUM 10.4*  --  10.5* 10.8*   < > = values in this interval not displayed.   GFR Estimated Creatinine Clearance: 68.7 mL/min (by C-G formula based on SCr of 0.84 mg/dL). Liver Function Tests: Recent Labs  Lab 12/18/19 0435  AST 70*  ALT  46*  ALKPHOS 275*  BILITOT 0.7  PROT 7.1  ALBUMIN 3.4*   No results for input(s): LIPASE, AMYLASE in the last 168 hours. No results for input(s): AMMONIA in the last 168 hours. Coagulation profile No results for input(s): INR, PROTIME in the last 168 hours.  CBC: Recent Labs  Lab 12/13/19 1642 12/16/19 0532 12/18/19 0435  WBC 3.2* 4.0 5.5  NEUTROABS 1.5* 1.9 2.9  HGB 12.2* 10.8* 11.6*  HCT 38.1* 31.4* 35.2*  MCV 90.7 86.0 88.2  PLT 67* 72* 75*   Cardiac Enzymes: No results for input(s): CKTOTAL, CKMB, CKMBINDEX, TROPONINI in the last 168 hours. BNP: Invalid input(s): POCBNP CBG: No results for input(s): GLUCAP in the last 168 hours. D-Dimer No results for input(s): DDIMER in the last 72 hours. Hgb A1c No results for input(s): HGBA1C in the last 72 hours. Lipid Profile No results for input(s): CHOL, HDL, LDLCALC, TRIG, CHOLHDL, LDLDIRECT in the last 72 hours. Thyroid function studies No results for input(s): TSH, T4TOTAL, T3FREE, THYROIDAB in the last 72 hours.  Invalid input(s): FREET3 Anemia work up No results for input(s): VITAMINB12, FOLATE, FERRITIN, TIBC, IRON, RETICCTPCT  in the last 72 hours. Microbiology Recent Results (from the past 240 hour(s))  SARS CORONAVIRUS 2 (TAT 6-24 HRS) Nasopharyngeal Nasopharyngeal Swab     Status: None   Collection Time: 12/13/19  6:57 PM   Specimen: Nasopharyngeal Swab  Result Value Ref Range Status   SARS Coronavirus 2 NEGATIVE NEGATIVE Final    Comment: (NOTE) SARS-CoV-2 target nucleic acids are NOT DETECTED. The SARS-CoV-2 RNA is generally detectable in upper and lower respiratory specimens during the acute phase of infection. Negative results do not preclude SARS-CoV-2 infection, do not rule out co-infections with other pathogens, and should not be used as the sole basis for treatment or other patient management decisions. Negative results must be combined with clinical observations, patient history, and  epidemiological information. The expected result is Negative. Fact Sheet for Patients: SugarRoll.be Fact Sheet for Healthcare Providers: https://www.woods-mathews.com/ This test is not yet approved or cleared by the [place] FDA and  has been authorized for detection and/or diagnosis of SARS-CoV-2 by FDA under an Emergency Use Authorization (EUA). This EUA will remain  in effect (meaning this test can be used) for the duration of the COVID-19 declaration under Section 56 4(b)(1) of the Act, 21 U.S.C. section 360bbb-3(b)(1), unless the authorization is terminated or revoked sooner. Performed at [facility], [place] [address] [place], [place] [address]       Studies:  No results found.  Assessment: 74 y.o.   1.  Metastatic small cell lung cancer 2.  Failure to thrive, malignant cachexia 3.  Anemia 4.  Thrombocytopenia 5.  dehydration and acute renal failure, resolved 6.  malignant hypercalcemia, stable  7.  History of CVA 8.  Goals of care  9. Full code    Plan:  -Pt is clinically stable, appetite and oral intake has improved since admission, still very weak, at risk for fall, and still intermittently mildly confused. NO pain or other complains -pt expressed his wishes to go home today, I discussed with his son.  Due to lack of adequate support from his wife at home, his son strongly favor him going to rehab. He talked to pt and convinced him to stay.  -SNF placement is still pending -urine output improved after IV fluids yesterday, he is drinking well today. -today's lab reviewed, stable. No need lab tomorrow   Truitt Merle, MD 12/18/2019  2:38 PM

## 2019-12-18 NOTE — Telephone Encounter (Signed)
Mr. Jeremy Mack, Brooke Bonito called requesting a pureed diet for his father.

## 2019-12-18 NOTE — Telephone Encounter (Signed)
Jeremy Mack returned your call. Would not leave number.  He said you have his number

## 2019-12-18 NOTE — Progress Notes (Signed)
Chaplain present to notarize advance directive.  Jeremy Mack documented as intermittently confused.    On this visit, Jeremy Mack was able to demonstrate orientation x4 over several independent occasions.  He recalled the information in the document and was able to consistently answer open ended questions about his end of life wishes.  He was briefly confused about chaplain presence - thinking chaplain was there to speak about rehab, but was able to quickly orient to conversation about advance directives.    Chaplain notarized document with two independent witnesses.  Chaplain Sallyanne Kuster also present during interaction.    Copy placed in pt chart.  Original and copies with pt.     Jerene Pitch, MDiv, [facility]  Lead Clinical Chaplain  [facility] and [facility].

## 2019-12-19 MED ORDER — ADULT MULTIVITAMIN W/MINERALS CH: 1.0000 | ORAL_TABLET | Freq: Every day | ORAL | Status: AC

## 2019-12-19 MED ORDER — ACETAMINOPHEN 325 MG PO TABS: 650.0000 mg | ORAL_TABLET | Freq: Four times a day (QID) | ORAL | Status: AC | PRN

## 2019-12-19 MED ORDER — HALOPERIDOL 0.5 MG PO TABS: 0.5000 mg | ORAL_TABLET | ORAL | 0 refills | Status: AC | PRN

## 2019-12-19 MED ORDER — SENNOSIDES-DOCUSATE SODIUM 8.6-50 MG PO TABS: 1.0000 | ORAL_TABLET | Freq: Every evening | ORAL | Status: AC | PRN

## 2019-12-19 MED ORDER — BOOST PLUS PO LIQD: 237.0000 mL | Freq: Three times a day (TID) | ORAL | 0 refills | Status: AC

## 2019-12-19 NOTE — Progress Notes (Signed)
Patient has been discharged. This RN, called the patient's son, Jules Baty, to inform him of the patient's discharge and he told this RN he would not be able to come and get the patient because he worked 16 hours a day. [name], MD was called and made aware. MD called both the son ,Brentyn, and the patient's wife, Traeton Bordas, to ensure that it would be okay that the patient is discharged to the address listed in the patient's. Per MD, both the son and wife agreed to the plan of discharge and the patient returning to the address listed in the patient's chart via PTAR. Will call and set up transportation.

## 2019-12-19 NOTE — TOC Progression Note (Addendum)
Transition of Care [facility]) - Progression Note    Patient Details  Name: Jeremy Mack MRN: [id number] Date of Birth: 11-Jan-1946  Transition of [facility] CM/SW Contact  Magdaline Zollars, Marjie Skiff, RN Phone Number: 12/19/2019, 2:50 PM  Clinical Narrative:    This CM alerted son Kamel that I continued to not have SNF bed offers. This CM explained at length the difference between home health services and home hospice. Alroy states that he would like to take pt home with home health services and has heard of [facility] previously and would like to use them. Referral given to [facility]. Awaiting MD orders for the following DME: hospital bed, rolling walker and 3in1. This CM will alert [facility] of equipment needs when orders received.  Addendum:  Per DME liaison, hospital beds are not available at this time. RW and 3in1 to be delivered to pt room. This CM faxed pt FL2 and H&P to [facility] per pt son request. PTAR paperwork left on department in case PTAR is needed for dc.   Expected Discharge Plan: [place] Barriers to Discharge: No Barriers Identified  Expected Discharge Plan and Services Expected Discharge Plan: [place]   Discharge Planning Services: CM Consult   Living arrangements for the past 2 months: Single Family Home                           HH Arranged: RN, PT, OT, Nurse's Aide, Social Work [employer] Agency: [place] Date [facility] Agency Contacted: 12/19/19 Time [place]: 50 Representative spoke with at [place]: [place] ([place]) Interventions    Readmission Risk Interventions Readmission Risk Prevention Plan 12/17/2019  Transportation Screening Complete  PCP or Specialist Appt within 3-5 Days Complete  HRI or [place] Complete  Social Work Consult for [place] Planning/Counseling Complete  Palliative Care Screening Complete  Medication Review [employer]) Complete   Some recent data might be hidden

## 2019-12-19 NOTE — Progress Notes (Addendum)
    Durable Medical Equipment  (From admission, onward)         Start     Ordered   12/19/19 1515  For home use only DME 3 n 1  Once     12/19/19 1514   12/19/19 1515  For home use only DME Walker rolling  Once    Question Answer Comment  Walker: With [place] Wheels   Patient needs a walker to treat with the following condition Small cell lung cancer ([place])      12/19/19 1514   12/19/19 1514  For home use only DME Hospital bed  Once    Question Answer Comment  Length of Need 6 Months   Bed type Semi-electric      12/19/19 1514

## 2019-12-19 NOTE — Discharge Summary (Addendum)
Discharge Summary  Patient ID: Jeremy Mack MRN: [id number] DOB/AGE: [birth year] 72 y.o.  Admit date: 12/13/2019 Discharge date: 12/19/2019  Discharge Diagnoses:  Active Problems:   Metastatic lung cancer (metastasis from lung to other site) [facility])   Failure to thrive in adult  Discharged Condition: stable  Discharge Labs:   CBC    Component Value Date/Time   WBC 5.5 12/18/2019 0435   RBC 3.99 (L) 12/18/2019 0435   HGB 11.6 (L) 12/18/2019 0435   HGB 12.8 (L) 12/11/2019 1225   HCT 35.2 (L) 12/18/2019 0435   PLT 75 (L) 12/18/2019 0435   PLT 77 (L) 12/11/2019 1225   MCV 88.2 12/18/2019 0435   MCH 29.1 12/18/2019 0435   MCHC 33.0 12/18/2019 0435   RDW 14.9 12/18/2019 0435   LYMPHSABS 1.8 12/18/2019 0435   MONOABS 0.4 12/18/2019 0435   EOSABS 0.0 12/18/2019 0435   BASOSABS 0.0 12/18/2019 0435   CMP Latest Ref Rng & Units 12/18/2019 12/16/2019 12/13/2019  Glucose 70 - 99 mg/dL 102(H) 99 84  BUN 8 - 23 mg/dL 9 12 33(H)  Creatinine 0.61 - 1.24 mg/dL 0.84 0.92 1.40(H)  Sodium 135 - 145 mmol/L 136 137 141  Potassium 3.5 - 5.1 mmol/L 3.8 4.2 4.5  Chloride 98 - 111 mmol/L 104 105 107  CO2 22 - 32 mmol/L 20(L) 21(L) 21(L)  Calcium 8.9 - 10.3 mg/dL 10.8(H) 10.5(H) 10.4(H)  Total Protein 6.5 - 8.1 g/dL 7.1 - -  Total Bilirubin 0.3 - 1.2 mg/dL 0.7 - -  Alkaline Phos 38 - 126 U/L 275(H) - -  AST 15 - 41 U/L 70(H) - -  ALT 0 - 44 U/L 46(H) - -   Consults: None  Procedures: None  Disposition:  There are no questions and answers to display.        Allergies as of 12/19/2019   No Known Allergies     Medication List    STOP taking these medications   atorvastatin 40 MG tablet Commonly known as: LIPITOR   Fish Oil 1000 MG Caps   nicotine 14 mg/24hr patch Commonly known as: NICODERM CQ - dosed in mg/24 hours     TAKE these medications   acetaminophen 325 MG tablet Commonly known as: TYLENOL Take 2 tablets (650 mg total) by mouth every 6 (six) hours as needed for mild  pain (or Fever >/= 101).   allopurinol 300 MG tablet Commonly known as: ZYLOPRIM Take 1 tablet (300 mg total) by mouth 2 (two) times daily.   amLODipine 10 MG tablet Commonly known as: NORVASC Take 1 tablet (10 mg total) by mouth daily.   apixaban 5 MG Tabs tablet Commonly known as: ELIQUIS Take 1 tablet (5 mg total) by mouth 2 (two) times daily.   aspirin 81 MG chewable tablet Chew 81 mg by mouth daily.   haloperidol 0.5 MG tablet Commonly known as: HALDOL Take 1 tablet (0.5 mg total) by mouth every 4 (four) hours as needed for agitation (or delirium).   lactose free nutrition Liqd Take 237 mLs by mouth 3 (three) times daily with meals.   lidocaine-prilocaine cream Commonly known as: EMLA Apply 1 application topically as needed.   megestrol 625 MG/5ML suspension Commonly known as: MEGACE ES Take 5 mLs (625 mg total) by mouth daily.   multivitamin with minerals Tabs tablet Take 1 tablet by mouth daily. Start taking on: December 20, 2019   ondansetron 8 MG tablet Commonly known as: Zofran Take 1 tablet (8 mg total) by mouth  2 (two) times daily as needed for refractory nausea / vomiting. Start on day 3 after carboplatin chemo.   pantoprazole 20 MG tablet Commonly known as: PROTONIX Take 1 tablet (20 mg total) by mouth daily.   prochlorperazine 10 MG tablet Commonly known as: COMPAZINE Take 1 tablet (10 mg total) by mouth every 6 (six) hours as needed (Nausea or vomiting).   senna-docusate 8.6-50 MG tablet Commonly known as: Senokot-S Take 1 tablet by mouth at bedtime as needed for mild constipation.   traMADol 50 MG tablet Commonly known as: ULTRAM Take 1 tablet (50 mg total) by mouth every 6 (six) hours as needed for moderate pain.            Durable Medical Equipment  (From admission, onward)         Start     Ordered   12/19/19 1515  For home use only DME 3 n 1  Once     12/19/19 1514   12/19/19 1515  For home use only DME Walker rolling  Once     Question Answer Comment  Walker: With [place] Wheels   Patient needs a walker to treat with the following condition Small cell lung cancer ([place])      12/19/19 1514   12/19/19 1514  For home use only DME Hospital bed  Once    Question Answer Comment  Length of Need 6 Months   Bed type Semi-electric      12/19/19 1514          Follow-up Information    Care, [facility] Follow up.   Specialty: Home Health Services Contact information: [place] [address] [phone]           HPI: Mr. Jeremy Mack is well-known to me, under my care for his metastatic small cell lung cancer.  He was brought by EMS to [facility] emergency room today, for failure of thrive at home.   He was last seen in our office 2 days prior to admission.  Due to his recent rapid deterioration,chemotherapy was discontinued and we recommended home hospice. The Hospice nurse went to the home today and discussed with patient and his wife. The patient has been lethargic, disorientation, not eating or drinking much.  Hospice nurse recommended residential hospice. CODE STATUS was discussed with his wife, she refused DNR/DNI, and called EMS and sent him to [facility] ED.   Hospital Course: Upon admission, the patient's wife initially agreed to a DNR/DNI and agreed to residential hospice.  No bed was available so he was admitted to the oncology unit.  We were contacted later on the day of admission by the patient's daughter who requested full code and medical treatment for his cancer.  The patient was given IV fluids during his hospitalization with improvement of his hydration status.  He was also seen by the dietitian who recommended Boost Plus 3 times a day along with a multivitamin.  We had a family meeting with the patient, his son Jeremy Mack, daughter Jeremy Mack, and the patient's wife Jeremy Mack.  The patient's son and daughter wish for him to remain a full code but have agreed to limited (no more than a week) life  support after resuscitation.  The family wanted to pursue placement at a skilled nursing facility for rehabilitation versus home with home health.  His FL 2 was sent to many facilities, but we did not receive a bed offer.  There was concern that he would not  be able to participate in rehabilitation and benefit from this fully.  The family was agreeable to taking him home with home health and medical equipment including a hospital bed, rolling walker, and 3 in 1.  All orders for this equipment have been placed and will be delivered to the patient.  The patient was seen on 12/19/2019 and found to be stable for discharge.  Orders have been placed for him to discharge home later today. Notified son of plan to discharge today.   Signed: Mikey Mack 12/19/2019, 3:42 PM   Addendum I have seen the patient, examined him. I agree with the assessment and and plan and have edited the notes.   I had long conversation with his son today, regarding his discharge plan. He has agreed to let pt to go home, with home care service including nursing and PT. We discussed risk of fall, and he will arrange his family members to check on pt. I spoke with his wife around 6pm. She is at home and knows pt will be sent home by medical transportation service.  I will f/u with him next week. He has declined hospice and DNR/DNI at this point.   Jeremy Mack  12/19/2019

## 2019-12-19 NOTE — Progress Notes (Addendum)
Jeremy Mack   DOB:26-May-1946   GH#:[id number]   CVE#:[id number]  Medical Oncology follow up note   Subjective: Patient is clinically stable, denies any significant pain or other discomfort.  Per his nurse, he has been eating and drinking well.  He has not been out of bed much due to risk for fall, but is working with PT.  He has intermittent confusion but can answer questions appropriately.  Objective:  Vitals:   12/19/19 0543 12/19/19 0815  BP: (!) 130/93 (!) 128/94  Pulse: (!) 109 (!) 105  Resp: 18 18  Temp: 97.7 F (36.5 C) 97.7 F (36.5 C)  SpO2: 100% 100%    Body mass index is 18 kg/m.  Intake/Output Summary (Last 24 hours) at 12/19/2019 0928 Last data filed at 12/19/2019 0825 Gross per 24 hour  Intake 954 ml  Output 1100 ml  Net -146 ml     Sclerae unicteric  Oropharynx clear  No peripheral adenopathy  Lungs clear -- no rales or rhonchi  Heart regular rate and rhythm  Abdomen benign  MSK no focal spinal tenderness, no peripheral edema  Neuro nonfocal    CBG (last 3)  No results for input(s): GLUCAP in the last 72 hours.   Labs:  Urine Studies No results for input(s): UHGB, CRYS in the last 72 hours.  Invalid input(s): UACOL, UAPR, USPG, UPH, UTP, UGL, UKET, UBIL, UNIT, UROB, ULEU, UEPI, UWBC, URBC, UBAC, CAST, [place], [place]  Basic Metabolic Panel: Recent Labs  Lab 12/13/19 1642 12/13/19 1642 12/16/19 0532 12/18/19 0435  NA 141  --  137 136  K 4.5   < > 4.2 3.8  CL 107  --  105 104  CO2 21*  --  21* 20*  GLUCOSE 84  --  99 102*  BUN 33*  --  12 9  CREATININE 1.40*  --  0.92 0.84  CALCIUM 10.4*  --  10.5* 10.8*   < > = values in this interval not displayed.   GFR Estimated Creatinine Clearance: 68.7 mL/min (by C-G formula based on SCr of 0.84 mg/dL). Liver Function Tests: Recent Labs  Lab 12/18/19 0435  AST 70*  ALT 46*  ALKPHOS 275*  BILITOT 0.7  PROT 7.1  ALBUMIN 3.4*   No results for input(s): LIPASE, AMYLASE in the last 168 hours. No  results for input(s): AMMONIA in the last 168 hours. Coagulation profile No results for input(s): INR, PROTIME in the last 168 hours.  CBC: Recent Labs  Lab 12/13/19 1642 12/16/19 0532 12/18/19 0435  WBC 3.2* 4.0 5.5  NEUTROABS 1.5* 1.9 2.9  HGB 12.2* 10.8* 11.6*  HCT 38.1* 31.4* 35.2*  MCV 90.7 86.0 88.2  PLT 67* 72* 75*   Cardiac Enzymes: No results for input(s): CKTOTAL, CKMB, CKMBINDEX, TROPONINI in the last 168 hours. BNP: Invalid input(s): POCBNP CBG: No results for input(s): GLUCAP in the last 168 hours. D-Dimer No results for input(s): DDIMER in the last 72 hours. Hgb A1c No results for input(s): HGBA1C in the last 72 hours. Lipid Profile No results for input(s): CHOL, HDL, LDLCALC, TRIG, CHOLHDL, LDLDIRECT in the last 72 hours. Thyroid function studies No results for input(s): TSH, T4TOTAL, T3FREE, THYROIDAB in the last 72 hours.  Invalid input(s): FREET3 Anemia work up No results for input(s): VITAMINB12, FOLATE, FERRITIN, TIBC, IRON, RETICCTPCT in the last 72 hours. Microbiology Recent Results (from the past 240 hour(s))  SARS CORONAVIRUS 2 (TAT 6-24 HRS) Nasopharyngeal Nasopharyngeal Swab     Status: None   Collection  Time: 12/13/19  6:57 PM   Specimen: Nasopharyngeal Swab  Result Value Ref Range Status   SARS Coronavirus 2 NEGATIVE NEGATIVE Final    Comment: (NOTE) SARS-CoV-2 target nucleic acids are NOT DETECTED. The SARS-CoV-2 RNA is generally detectable in upper and lower respiratory specimens during the acute phase of infection. Negative results do not preclude SARS-CoV-2 infection, do not rule out co-infections with other pathogens, and should not be used as the sole basis for treatment or other patient management decisions. Negative results must be combined with clinical observations, patient history, and epidemiological information. The expected result is Negative. Fact Sheet for Patients: SugarRoll.be Fact Sheet  for Healthcare Providers: https://www.woods-mathews.com/ This test is not yet approved or cleared by the [place] FDA and  has been authorized for detection and/or diagnosis of SARS-CoV-2 by FDA under an Emergency Use Authorization (EUA). This EUA will remain  in effect (meaning this test can be used) for the duration of the COVID-19 declaration under Section 56 4(b)(1) of the Act, 21 U.S.C. section 360bbb-3(b)(1), unless the authorization is terminated or revoked sooner. Performed at [facility], [place] [address], [place], [place] [address]       Studies:  No results found.  Assessment: 74 y.o.   1.  Metastatic small cell lung cancer 2.  Failure to thrive, malignant cachexia 3.  Anemia 4.  Thrombocytopenia 5.  dehydration and acute renal failure, resolved 6.  malignant hypercalcemia, stable  7.  History of CVA 8.  Goals of care  9. Full code    Plan:  -Pt is clinically stable, appetite and oral intake has improved since admission, still very weak, at risk for fall, and still intermittently mildly confused. No pain or other complaints. -The patient is medically stable for discharge.  We are awaiting further plans regarding disposition.  Discussed with case Freight forwarder. -SNF placement is still pending.  -urine output has improved.  He is drinking well. -Labs from 12/18/2019 are stable.  We will hold off on repeating labs at this time.  Mikey Bussing, NP 12/19/2019  9:28 AM

## 2019-12-20 ENCOUNTER — Telehealth: Payer: Self-pay

## 2019-12-20 NOTE — Telephone Encounter (Signed)
Received message that patient did not admit to hospice services Phone call placed to patient's wife to offer to schedule a visit with Palliative NP. Verbal consent obtained. Scheduled for Tuesday 12/24/19 @ 12:00pm

## 2019-12-23 ENCOUNTER — Other Ambulatory Visit: Payer: Self-pay

## 2019-12-23 NOTE — Patient Outreach (Signed)
[facility]) Care Management  12/23/2019  Jeremy Mack 1946-06-10 [id number]   Telephone call to wife [name] for follow up hospitalization. She states it has been hard caring for the patient by herself but she is doing the best she can.  She states that patient is terminal and really needs hospice at this point. She states also really needs the support of hospice as well. Advised her that palliative care is to visit on tomorrow and will discuss hospice.  She verbalized understanding and is relieved to hear that. She states she really gets no help from patient's children at this time.    She states that home health came over this weekend for the evaluation visit but otherwise no other support.  She states that patient is not really eating but is drinking some and that she was able to get him to eat some ice cream.  Discussed food intake and end of life with wife.  She verbalized understanding and states she offers but does not force him to eat.  Patient is total care when it comes to his care and she manages to keep him clean when he is incontinent. Encouraged her to continue with her current plan of care with patient and discuss hospice services tomorrow with palliative care when they visit on tomorrow. She verbalized understanding.   Plan: RN CM will follow up with wife later in the week after palliative care visit.    [name], RN, MSN [facility] Care Management Coordinator Direct Line [phone] Cell [phone] Toll Free: [phone]  Fax: [phone]

## 2019-12-24 ENCOUNTER — Telehealth: Payer: Self-pay

## 2019-12-24 ENCOUNTER — Other Ambulatory Visit: Payer: Self-pay | Admitting: Pharmacist

## 2019-12-24 ENCOUNTER — Inpatient Hospital Stay (HOSPITAL_COMMUNITY)
Admission: EM | Admit: 2019-12-24 | Discharge: 2019-12-26 | DRG: 180 | Disposition: A | Discharge status: Hospice | Payer: Medicare (Managed Care) | Attending: Internal Medicine | Admitting: Internal Medicine

## 2019-12-24 ENCOUNTER — Other Ambulatory Visit: Payer: Self-pay

## 2019-12-24 ENCOUNTER — Emergency Department (HOSPITAL_COMMUNITY): Payer: Medicare (Managed Care)

## 2019-12-24 ENCOUNTER — Other Ambulatory Visit: Payer: Medicare (Managed Care) | Admitting: Internal Medicine

## 2019-12-24 DIAGNOSIS — Z7982 Long term (current) use of aspirin: Secondary | ICD-10-CM

## 2019-12-24 DIAGNOSIS — E860 Dehydration: Secondary | ICD-10-CM

## 2019-12-24 DIAGNOSIS — Z8673 Personal history of transient ischemic attack (TIA), and cerebral infarction without residual deficits: Secondary | ICD-10-CM

## 2019-12-24 DIAGNOSIS — Z833 Family history of diabetes mellitus: Secondary | ICD-10-CM | POA: Diagnosis not present

## 2019-12-24 DIAGNOSIS — R627 Adult failure to thrive: Secondary | ICD-10-CM | POA: Diagnosis present

## 2019-12-24 DIAGNOSIS — R000 Tachycardia, unspecified: Secondary | ICD-10-CM | POA: Diagnosis present

## 2019-12-24 DIAGNOSIS — C3412 Malignant neoplasm of upper lobe, left bronchus or lung: Principal | ICD-10-CM | POA: Diagnosis present

## 2019-12-24 DIAGNOSIS — F17290 Nicotine dependence, other tobacco product, uncomplicated: Secondary | ICD-10-CM | POA: Diagnosis present

## 2019-12-24 DIAGNOSIS — R9431 Abnormal electrocardiogram [ECG] [EKG]: Secondary | ICD-10-CM | POA: Diagnosis not present

## 2019-12-24 DIAGNOSIS — Z7901 Long term (current) use of anticoagulants: Secondary | ICD-10-CM | POA: Diagnosis not present

## 2019-12-24 DIAGNOSIS — E785 Hyperlipidemia, unspecified: Secondary | ICD-10-CM | POA: Diagnosis present

## 2019-12-24 DIAGNOSIS — C7951 Secondary malignant neoplasm of bone: Secondary | ICD-10-CM | POA: Diagnosis present

## 2019-12-24 DIAGNOSIS — D696 Thrombocytopenia, unspecified: Secondary | ICD-10-CM | POA: Diagnosis present

## 2019-12-24 DIAGNOSIS — Z681 Body mass index (BMI) 19 or less, adult: Secondary | ICD-10-CM | POA: Diagnosis not present

## 2019-12-24 DIAGNOSIS — K219 Gastro-esophageal reflux disease without esophagitis: Secondary | ICD-10-CM | POA: Diagnosis present

## 2019-12-24 DIAGNOSIS — R531 Weakness: Secondary | ICD-10-CM

## 2019-12-24 DIAGNOSIS — I959 Hypotension, unspecified: Secondary | ICD-10-CM | POA: Diagnosis present

## 2019-12-24 DIAGNOSIS — N179 Acute kidney failure, unspecified: Secondary | ICD-10-CM | POA: Diagnosis present

## 2019-12-24 DIAGNOSIS — Z515 Encounter for palliative care: Secondary | ICD-10-CM

## 2019-12-24 DIAGNOSIS — E43 Unspecified severe protein-calorie malnutrition: Secondary | ICD-10-CM | POA: Diagnosis present

## 2019-12-24 DIAGNOSIS — Z20822 Contact with and (suspected) exposure to covid-19: Secondary | ICD-10-CM | POA: Diagnosis present

## 2019-12-24 DIAGNOSIS — C3490 Malignant neoplasm of unspecified part of unspecified bronchus or lung: Secondary | ICD-10-CM | POA: Diagnosis present

## 2019-12-24 DIAGNOSIS — D630 Anemia in neoplastic disease: Secondary | ICD-10-CM | POA: Diagnosis present

## 2019-12-24 DIAGNOSIS — R64 Cachexia: Secondary | ICD-10-CM | POA: Diagnosis present

## 2019-12-24 DIAGNOSIS — C787 Secondary malignant neoplasm of liver and intrahepatic bile duct: Secondary | ICD-10-CM | POA: Diagnosis present

## 2019-12-24 DIAGNOSIS — Z8719 Personal history of other diseases of the digestive system: Secondary | ICD-10-CM | POA: Diagnosis not present

## 2019-12-24 DIAGNOSIS — R6251 Failure to thrive (child): Secondary | ICD-10-CM | POA: Insufficient documentation

## 2019-12-24 DIAGNOSIS — Z79891 Long term (current) use of opiate analgesic: Secondary | ICD-10-CM

## 2019-12-24 DIAGNOSIS — I10 Essential (primary) hypertension: Secondary | ICD-10-CM | POA: Diagnosis present

## 2019-12-24 DIAGNOSIS — Z79890 Hormone replacement therapy (postmenopausal): Secondary | ICD-10-CM

## 2019-12-24 DIAGNOSIS — Z923 Personal history of irradiation: Secondary | ICD-10-CM

## 2019-12-24 DIAGNOSIS — D649 Anemia, unspecified: Secondary | ICD-10-CM

## 2019-12-24 DIAGNOSIS — Z9221 Personal history of antineoplastic chemotherapy: Secondary | ICD-10-CM

## 2019-12-24 DIAGNOSIS — Z7401 Bed confinement status: Secondary | ICD-10-CM

## 2019-12-24 DIAGNOSIS — I9589 Other hypotension: Secondary | ICD-10-CM

## 2019-12-24 DIAGNOSIS — Z79899 Other long term (current) drug therapy: Secondary | ICD-10-CM

## 2019-12-24 LAB — COMPREHENSIVE METABOLIC PANEL
ALT: 53 U/L — ABNORMAL HIGH (ref 0–44)
AST: 93 U/L — ABNORMAL HIGH (ref 15–41)
Albumin: 3.1 g/dL — ABNORMAL LOW (ref 3.5–5.0)
Alkaline Phosphatase: 260 U/L — ABNORMAL HIGH (ref 38–126)
Anion gap: 15 (ref 5–15)
BUN: 58 mg/dL — ABNORMAL HIGH (ref 8–23)
CO2: 20 mmol/L — ABNORMAL LOW (ref 22–32)
Calcium: 11.8 mg/dL — ABNORMAL HIGH (ref 8.9–10.3)
Chloride: 107 mmol/L (ref 98–111)
Creatinine, Ser: 1.67 mg/dL — ABNORMAL HIGH (ref 0.61–1.24)
GFR calc Af Amer: 47 mL/min — ABNORMAL LOW (ref >60)
GFR calc non Af Amer: 41 mL/min — ABNORMAL LOW (ref >60)
Glucose, Bld: 132 mg/dL — ABNORMAL HIGH (ref 70–99)
Potassium: 5 mmol/L (ref 3.5–5.1)
Sodium: 142 mmol/L (ref 135–145)
Total Bilirubin: 1.1 mg/dL (ref 0.3–1.2)
Total Protein: 7.8 g/dL (ref 6.5–8.1)

## 2019-12-24 LAB — CBC WITH DIFFERENTIAL/PLATELET
Abs Immature Granulocytes: 0.42 10*3/uL — ABNORMAL HIGH (ref 0.00–0.07)
Basophils Absolute: 0 10*3/uL (ref 0.0–0.1)
Basophils Relative: 1 %
Eosinophils Absolute: 0 10*3/uL (ref 0.0–0.5)
Eosinophils Relative: 0 %
HCT: 36.2 % — ABNORMAL LOW (ref 39.0–52.0)
Hemoglobin: 11.8 g/dL — ABNORMAL LOW (ref 13.0–17.0)
Immature Granulocytes: 5 %
Lymphocytes Relative: 35 %
Lymphs Abs: 2.8 10*3/uL (ref 0.7–4.0)
MCH: 29 pg (ref 26.0–34.0)
MCHC: 32.6 g/dL (ref 30.0–36.0)
MCV: 88.9 fL (ref 80.0–100.0)
Monocytes Absolute: 0.5 10*3/uL (ref 0.1–1.0)
Monocytes Relative: 6 %
Neutro Abs: 4.3 10*3/uL (ref 1.7–7.7)
Neutrophils Relative %: 53 %
Platelets: 54 10*3/uL — ABNORMAL LOW (ref 150–400)
RBC: 4.07 MIL/uL — ABNORMAL LOW (ref 4.22–5.81)
RDW: 15.4 % (ref 11.5–15.5)
WBC: 8 10*3/uL (ref 4.0–10.5)
nRBC: 2.4 % — ABNORMAL HIGH (ref 0.0–0.2)

## 2019-12-24 LAB — URINALYSIS, ROUTINE W REFLEX MICROSCOPIC
Bilirubin Urine: NEGATIVE
Glucose, UA: NEGATIVE mg/dL
Hgb urine dipstick: NEGATIVE
Ketones, ur: NEGATIVE mg/dL
Nitrite: NEGATIVE
Protein, ur: 30 mg/dL — AB
Specific Gravity, Urine: 1.023 (ref 1.005–1.030)
pH: 5 (ref 5.0–8.0)

## 2019-12-24 LAB — LACTIC ACID, PLASMA
Lactic Acid, Venous: 3 mmol/L (ref 0.5–1.9)
Lactic Acid, Venous: 2.9 mmol/L (ref 0.5–1.9)

## 2019-12-24 LAB — TROPONIN I (HIGH SENSITIVITY)
Troponin I (High Sensitivity): 7 ng/L (ref <18)
Troponin I (High Sensitivity): 6 ng/L (ref <18)

## 2019-12-24 LAB — BRAIN NATRIURETIC PEPTIDE: B Natriuretic Peptide: 85.9 pg/mL (ref 0.0–100.0)

## 2019-12-24 LAB — PROTIME-INR
INR: 1.2 (ref 0.8–1.2)
Prothrombin Time: 15 seconds (ref 11.4–15.2)

## 2019-12-24 LAB — MAGNESIUM: Magnesium: 2.3 mg/dL (ref 1.7–2.4)

## 2019-12-24 MED ORDER — POLYETHYLENE GLYCOL 3350 17 G PO PACK: 17.0000 g | PACK | Freq: Every day | ORAL | Status: DC | PRN

## 2019-12-24 MED ORDER — SODIUM CHLORIDE 0.9 % IV BOLUS
1000.0000 mL | Freq: Once | INTRAVENOUS | Status: AC
  Administered 2019-12-24: 19:00:00 1000 mL via INTRAVENOUS

## 2019-12-24 MED ORDER — LIDOCAINE-PRILOCAINE 2.5-2.5 % EX CREA
1.0000 "application " | TOPICAL_CREAM | CUTANEOUS | Status: DC | PRN
  Filled 2019-12-24: qty 5

## 2019-12-24 MED ORDER — SODIUM CHLORIDE 0.9 % IV SOLN: INTRAVENOUS | Status: DC

## 2019-12-24 MED ORDER — SODIUM CHLORIDE 0.9 % IV BOLUS
1000.0000 mL | Freq: Once | INTRAVENOUS | Status: AC
  Administered 2019-12-24: 1000 mL via INTRAVENOUS

## 2019-12-24 MED ORDER — ACETAMINOPHEN 650 MG RE SUPP: 650.0000 mg | Freq: Four times a day (QID) | RECTAL | Status: DC | PRN

## 2019-12-24 MED ORDER — SODIUM CHLORIDE 0.9 % IV BOLUS
2000.0000 mL | Freq: Once | INTRAVENOUS | Status: AC
  Administered 2019-12-24: 2000 mL via INTRAVENOUS

## 2019-12-24 MED ORDER — ACETAMINOPHEN 325 MG PO TABS: 650.0000 mg | ORAL_TABLET | Freq: Four times a day (QID) | ORAL | Status: DC | PRN

## 2019-12-24 MED ORDER — BISACODYL 10 MG RE SUPP: 10.0000 mg | Freq: Every day | RECTAL | Status: DC | PRN

## 2019-12-24 MED ORDER — ASPIRIN 81 MG PO CHEW
81.0000 mg | CHEWABLE_TABLET | Freq: Every day | ORAL | Status: DC
  Administered 2019-12-25 – 2019-12-26 (×2): 81 mg via ORAL
  Filled 2019-12-24 (×2): qty 1

## 2019-12-24 MED ORDER — SODIUM CHLORIDE 0.9% FLUSH
10.0000 mL | Freq: Two times a day (BID) | INTRAVENOUS | Status: DC
  Administered 2019-12-26: 10 mL

## 2019-12-24 MED ORDER — BOOST PLUS PO LIQD
237.0000 mL | Freq: Three times a day (TID) | ORAL | Status: DC
  Administered 2019-12-26: 237 mL via ORAL
  Filled 2019-12-24 (×7): qty 237

## 2019-12-24 MED ORDER — SODIUM CHLORIDE 0.9% FLUSH: 10.0000 mL | INTRAVENOUS | Status: DC | PRN

## 2019-12-24 MED ORDER — CHLORHEXIDINE GLUCONATE CLOTH 2 % EX PADS
6.0000 | MEDICATED_PAD | Freq: Every day | CUTANEOUS | Status: DC
  Administered 2019-12-25: 6 via TOPICAL

## 2019-12-24 NOTE — Telephone Encounter (Signed)
Received phone call from [facility] NP that patient is being transported to ED per children's request. Hospital Liaison team updated.

## 2019-12-24 NOTE — Patient Outreach (Addendum)
[facility]) Care Management  12/24/2019  Waddell Iten May 15, 1946 [id number]   Patient was called to follow up on medication assistance referral. Spoke with his wife, [name]. HIPAA identifiers were obtained. Just after identifying myself, the patient's wife was very upset and said the patient's daughter had taken him back to the ED and that she was "moving her Father".  Patient was hospitalized earlier this month for failure to thrive. He has metastatic lung cancer.  Mrs. Rubin thinks the patient will have to go to a nursing home.   Plan: Follow up in 1 week.  [name], PharmD, [place] Clinical Pharmacist [phone]

## 2019-12-24 NOTE — Progress Notes (Addendum)
HEMATOLOGY-ONCOLOGY PROGRESS NOTE  SUBJECTIVE: The patient is well-known to our practice.  We were notified earlier today that the palliative care NP went to visit with the patient at home and he was found to be very weak, dehydrated with a very low blood pressure.  Family requesting transfer to the hospital and for the patient to continue to be a full code.  The patient was seen in the ER today.  He is oriented to person.  Not oriented to place or time.  Clearly states that he wants to go home.  He is unable to offer any specific complaints.  Oncology History Overview Note  Cancer Staging Small cell lung cancer ([place]) Staging form: Lung, AJCC 8th Edition - Clinical: Stage IVB (cTX, cN2, pM1c) - Signed by Truitt Merle, MD on 06/06/2019    Primary malignant neoplasm of bronchus of left upper lobe ([place])  07/11/2018 Imaging   CT AP 07/11/18 IMPRESSION: 1. Significantly improved appearance of the pancreas and peripancreatic tissues. Improved to resolved peripancreatic edema with resolved and significantly improved peripancreatic lesions. The remaining lesion was felt to represent abscess on prior endoscopic ultrasound sampling. Pseudocyst or infected pseudocyst would be the favored imaging diagnosis. 2. Persistent but improved mass effect upon the inferior aspect of the portal vein and superior aspect of the superior mesenteric vein. 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 3. Aortic Atherosclerosis (ICD10-I70.0). Bilateral common iliac artery ectasia. 4. A left adrenal nodule is indeterminate on precontrast imaging and may have decreased in size since the prior. Consider dedicated adrenal protocol CT. This could either be performed in 6 months to confirm size stability or more acutely, depending on clinical concern. 5. A splenic lesion is indeterminate and can be re-evaluated on follow-up. 6. Hepatic morphology suspicious for cirrhosis. 7. Cholelithiasis. 8.   Emphysema (ICD10-J43.9).   05/01/2019 Imaging   CT AP 05/01/19  IMPRESSION: 1. New diffuse liver metastases. 2. New mild retroperitoneal lymphadenopathy, consistent with metastatic disease. 3. Resolution of previously seen pancreatic mass since previous study. No radiographic evidence of acute pancreatitis. 4. Stable left adrenal mass, consistent with adrenal adenoma. 5. Stable markedly enlarged prostate and findings of chronic bladder outlet obstruction. 6. Colonic diverticulosis. No radiographic evidence of diverticulitis. 7. Cholelithiasis.   Aortic Atherosclerosis (ICD10-I70.0).   05/28/2019 PET scan   PET 05/28/19  IMPRESSION: 1. Examination is positive for widespread FDG avid liver metastases. Liver lesions are too numerous to count. 2. Hypermetabolic mediastinal and upper abdominal nodal metastases. 3. Left upper lobe and right upper lobe hypermetabolic pulmonary nodules consistent with pulmonary metastasis. 4. Innumerable liver metastases noted within the axial and proximal appendicular skeleton. Bone lesions are too numerous to count. Some of these have a corresponding lytic lesion on CT images. Others appear occult on the corresponding CT images. 5. Solid hyperdense left adrenal nodule is not significantly changed from 05/01/2019 and exhibits mild FDG uptake. Indeterminate. 6. Prostate gland enlargement. Focal area of increased uptake within the left posterior gland is indeterminate.   06/04/2019 Initial Biopsy   Biopsy 06/04/19  Diagnosis Liver, needle/core biopsy - SMALL CELL CARCINOMA. SEE COMMENT.   06/06/2019 Initial Diagnosis   Small cell lung cancer ([place])   06/06/2019 Cancer Staging   Staging form: Lung, AJCC 8th Edition - Clinical: Stage IVB (cTX, cN2, pM1c) - Signed by Truitt Merle, MD on 06/06/2019   06/10/2019 - 11/04/2019 Chemotherapy   Carboplatin Day 1 and [place] day 1-3 with immunotherapy atezolizumab (Tecentriq) on day 1, every 3 weeks for 6 cycles starting  06/10/19. He is now on maintenance Tecentriq every 3 weeks. Stopped after 11/04/19 due to disease progression.    06/14/2019 Imaging    MRI brain 06/14/19  IMPRESSION: No brain metastases. Chronic small-vessel ischemic changes of the pons and cerebral hemispheric white matter.   Probable metastatic lesions within the upper cervical spine as seen. No sign of spinal canal encroachment.   07/30/2019 Imaging   CT CAP W Contrast  IMPRESSION: 1. Marked interval decrease in size of the hypermetabolic bilateral pulmonary nodules seen on previous PET-CT. 2. Clear interval decrease in the numerous hepatic metastases. 3. Interval resolution of mediastinal and upper abdominal lymphadenopathy seen previously. 4. Stable left adrenal nodule. This shows low level FDG uptake on prior PET-CT and is indeterminate. Continued attention on follow-up recommended. 5. No new or progressive findings to suggest disease progression. 6. Stable hypoenhancing splenic lesion. Attention on follow-up recommended. 7. Prostatomegaly. 8.  Aortic Atherosclerois (ICD10-170.0)     11/07/2019 - 11/21/2019 Radiation Therapy   Radiation to brain with Dr. Lisbeth Renshaw 11/07/19-11/21/19   11/28/2019 Imaging   CT CAP WO Contrast  IMPRESSION: 1. Interval progression of liver metastases. 2. Increase in size and number of right upper lobe pulmonary nodules. New right paratracheal adenopathy 3. Stable appearance of multifocal mixed lytic and sclerotic bone metastases. 4. Aortic atherosclerosis. Coronary artery calcifications noted.   Aortic Atherosclerosis (ICD10-I70.0).   12/04/2019 - 12/04/2019 Chemotherapy   Second-line Taxol starting 12/04/19. Only received 1 dose, will proceed with hospice.      PHYSICAL EXAMINATION: ECOG PERFORMANCE STATUS: 4 - Bedbound  Vitals:   12/24/19 1516 12/24/19 1550  BP: 103/82 (!) 146/79  Pulse: (!) 131   Resp: 16 (!) 22  Temp:    SpO2: 100%    Filed Weights   12/24/19 1426  Weight: 140 lb  (63.5 kg)    Intake/Output from previous day: No intake/output data recorded.  GENERAL: Chronically ill-appearing male HEART: Tachycardic per monitor, no lower extremity edema ABDOMEN:abdomen soft, non-tender and normal bowel sounds NEURO: alert & oriented to person only  LABORATORY DATA:  I have reviewed the data as listed CMP Latest Ref Rng & Units 12/24/2019 12/18/2019 12/16/2019  Glucose 70 - 99 mg/dL 132(H) 102(H) 99  BUN 8 - 23 mg/dL 58(H) 9 12  Creatinine 0.61 - 1.24 mg/dL 1.67(H) 0.84 0.92  Sodium 135 - 145 mmol/L 142 136 137  Potassium 3.5 - 5.1 mmol/L 5.0 3.8 4.2  Chloride 98 - 111 mmol/L 107 104 105  CO2 22 - 32 mmol/L 20(L) 20(L) 21(L)  Calcium 8.9 - 10.3 mg/dL 11.8(H) 10.8(H) 10.5(H)  Total Protein 6.5 - 8.1 g/dL 7.8 7.1 -  Total Bilirubin 0.3 - 1.2 mg/dL 1.1 0.7 -  Alkaline Phos 38 - 126 U/L 260(H) 275(H) -  AST 15 - 41 U/L 93(H) 70(H) -  ALT 0 - 44 U/L 53(H) 46(H) -    Lab Results  Component Value Date   WBC 8.0 12/24/2019   HGB 11.8 (L) 12/24/2019   HCT 36.2 (L) 12/24/2019   MCV 88.9 12/24/2019   PLT 54 (L) 12/24/2019   NEUTROABS 4.3 12/24/2019    CT Abdomen Pelvis Wo Contrast  Result Date: 11/28/2019 CLINICAL DATA:  Restaging small cell lung cancer. EXAM: CT CHEST, ABDOMEN AND PELVIS WITHOUT CONTRAST TECHNIQUE: Multidetector CT imaging of the chest, abdomen and pelvis was performed following the standard protocol without IV contrast. COMPARISON:  07/30/2019 FINDINGS: CT CHEST FINDINGS Cardiovascular: Normal heart size. No pericardial effusion. Aortic atherosclerosis lad and left circumflex  coronary artery calcifications. Mediastinum/Nodes: Normal appearance of the thyroid gland. The trachea appears patent and is midline. Normal appearance of the esophagus. Right paratracheal lymph node measures 2.3 cm. Previously 0.7 cm. Lower right paratracheal lymph node measures 1.8 cm, image 24/2. Previously 0.7 cm no enlarged supraclavicular or axillary lymph nodes.  Lungs/Pleura: No pleural effusion. No airspace consolidation, atelectasis or pneumothorax. Right upper lobe pulmonary nodules are again noted in are increased from previous exam. Peripheral spiculated right upper lobe lung nodule measures 1 cm, image 60/4. Central right upper lobe perihilar nodule measures 1.1 cm, image 63/4. New from previous exam. Adjacent nodule measures 2.0 cm, image 65/4. Also new from previous exam. Musculoskeletal: Multifocal areas of mixed lytic and sclerotic bone metastases are noted. The appearance is similar to previous exam. CT ABDOMEN PELVIS FINDINGS Hepatobiliary: Multifocal liver metastases have progressed from previous exam. Lesions are increased in size and number in the interval. For example, within lateral segment of left lobe of liver there is a 4.9 cm hypodense metastasis, image 54/2. Previously 2.1 cm. Dome of liver lesion measures 5.2 cm, image 46/2. Previously 1.8 cm segment 5 liver lesion measures 1.6 cm, image 63/2. Previously 0.8 cm. Lesion within segment 7 measures 2.2 cm, image 53/2. Previously 1.3 cm. Stones noted layering within the dependent portion of the gallbladder. No biliary ductal dilatation. Pancreas: Unremarkable. No pancreatic ductal dilatation or surrounding inflammatory changes. Spleen: Normal in size without focal abnormality. Adrenals/Urinary Tract: Normal right adrenal gland. There is a solid nodule in the left adrenal gland measuring 2 cm, image 57/2. Previously this measured the same. No kidney mass or hydronephrosis identified. Urinary bladder unremarkable. Stomach/Bowel: Stomach is within normal limits. Appendix not confidently identified. Postop change involving the sigmoid colon is again noted, stable. No evidence of bowel wall thickening, distention, or inflammatory changes. Vascular/Lymphatic: Aortic atherosclerosis. No abdominopelvic adenopathy. Reproductive: Enlargement of the prostate gland is again noted. Other: No free fluid or fluid  collections. Musculoskeletal: Heterogeneous bone mineralization within the bony pelvis and lumbar spine is similar to previous exam compatible with osseous metastasis. IMPRESSION: 1. Interval progression of liver metastases. 2. Increase in size and number of right upper lobe pulmonary nodules. New right paratracheal adenopathy 3. Stable appearance of multifocal mixed lytic and sclerotic bone metastases. 4. Aortic atherosclerosis. Coronary artery calcifications noted. Aortic Atherosclerosis (ICD10-I70.0). Electronically Signed   By: Kerby Moors M.D.   On: 11/28/2019 14:24   CT Chest Wo Contrast  Result Date: 11/28/2019 CLINICAL DATA:  Restaging small cell lung cancer. EXAM: CT CHEST, ABDOMEN AND PELVIS WITHOUT CONTRAST TECHNIQUE: Multidetector CT imaging of the chest, abdomen and pelvis was performed following the standard protocol without IV contrast. COMPARISON:  07/30/2019 FINDINGS: CT CHEST FINDINGS Cardiovascular: Normal heart size. No pericardial effusion. Aortic atherosclerosis lad and left circumflex coronary artery calcifications. Mediastinum/Nodes: Normal appearance of the thyroid gland. The trachea appears patent and is midline. Normal appearance of the esophagus. Right paratracheal lymph node measures 2.3 cm. Previously 0.7 cm. Lower right paratracheal lymph node measures 1.8 cm, image 24/2. Previously 0.7 cm no enlarged supraclavicular or axillary lymph nodes. Lungs/Pleura: No pleural effusion. No airspace consolidation, atelectasis or pneumothorax. Right upper lobe pulmonary nodules are again noted in are increased from previous exam. Peripheral spiculated right upper lobe lung nodule measures 1 cm, image 60/4. Central right upper lobe perihilar nodule measures 1.1 cm, image 63/4. New from previous exam. Adjacent nodule measures 2.0 cm, image 65/4. Also new from previous exam. Musculoskeletal: Multifocal areas of mixed lytic and  sclerotic bone metastases are noted. The appearance is similar to  previous exam. CT ABDOMEN PELVIS FINDINGS Hepatobiliary: Multifocal liver metastases have progressed from previous exam. Lesions are increased in size and number in the interval. For example, within lateral segment of left lobe of liver there is a 4.9 cm hypodense metastasis, image 54/2. Previously 2.1 cm. Dome of liver lesion measures 5.2 cm, image 46/2. Previously 1.8 cm segment 5 liver lesion measures 1.6 cm, image 63/2. Previously 0.8 cm. Lesion within segment 7 measures 2.2 cm, image 53/2. Previously 1.3 cm. Stones noted layering within the dependent portion of the gallbladder. No biliary ductal dilatation. Pancreas: Unremarkable. No pancreatic ductal dilatation or surrounding inflammatory changes. Spleen: Normal in size without focal abnormality. Adrenals/Urinary Tract: Normal right adrenal gland. There is a solid nodule in the left adrenal gland measuring 2 cm, image 57/2. Previously this measured the same. No kidney mass or hydronephrosis identified. Urinary bladder unremarkable. Stomach/Bowel: Stomach is within normal limits. Appendix not confidently identified. Postop change involving the sigmoid colon is again noted, stable. No evidence of bowel wall thickening, distention, or inflammatory changes. Vascular/Lymphatic: Aortic atherosclerosis. No abdominopelvic adenopathy. Reproductive: Enlargement of the prostate gland is again noted. Other: No free fluid or fluid collections. Musculoskeletal: Heterogeneous bone mineralization within the bony pelvis and lumbar spine is similar to previous exam compatible with osseous metastasis. IMPRESSION: 1. Interval progression of liver metastases. 2. Increase in size and number of right upper lobe pulmonary nodules. New right paratracheal adenopathy 3. Stable appearance of multifocal mixed lytic and sclerotic bone metastases. 4. Aortic atherosclerosis. Coronary artery calcifications noted. Aortic Atherosclerosis (ICD10-I70.0). Electronically Signed   By: Kerby Moors  M.D.   On: 11/28/2019 14:24   DG Chest Portable 1 View  Result Date: 12/24/2019 CLINICAL DATA:  Hypotension, dehydration EXAM: PORTABLE CHEST 1 VIEW COMPARISON:  CT 11/28/2019 FINDINGS: Right-sided chest port remains with distal tip terminating at the level of the distal SVC. Heart size is normal. Enlargement of the right paratracheal stripe compatible with known mediastinal adenopathy. Lungs are slightly hyperexpanded. Nodular density projects over the peripheral aspect of the right upper lobe and right perihilar regions compatible with known nodules. No new focal airspace consolidation. No pleural effusion. There is a vertical skin fold overlying the peripheral aspect of the right hemithorax with lung markings extending beyond. No evidence of pneumothorax. IMPRESSION: 1. No acute cardiopulmonary abnormality. 2. Right upper lobe and right perihilar nodular densities compatible with known lung nodules. 3. Enlargement of the right paratracheal stripe compatible with known mediastinal adenopathy. Electronically Signed   By: Davina Poke D.O.   On: 12/24/2019 16:11    ASSESSMENT AND PLAN: 74 y.o.   1. Metastatic small cell lung cancer 2. Failure to thrive, malignant cachexia 3. Anemia 4. Thrombocytopenia 5. dehydration and acute renal failure 6. malignant hypercalcemia 7. History of CVA 8. Goals of care  9. Full code   -Patient now presents to the emergency room due to weakness and failure to thrive.  Family continues to request for the patient to be a full code.  Call has been placed to the patient's son to discuss goals of care.  Son requesting for IV fluids and states that he is not eating at home.  He has concerns that his stepmother cannot care for the patient adequately.  Again requesting placement.  We attempted placement last admission, but due to insurance issues and the fact that the patient is a poor candidate for rehabilitation, we could not get him placed.  Again recommended  [facility] for residential hospice, but the patient's son declines and wants to continue aggressive measures.  Discussed with ER physician.  Recommend IV fluids and possible discharge home this evening and our office will arrange for IV fluids by home health versus admission to the hospitalist to again evaluate for placement. -We will follow up tomorrow if the patient is admitted.  If he is sent back home, our office will reach out to arrange for IV fluids.   LOS: 0 days   Mikey Bussing, DNP, AGPCNP-BC, AOCNP 12/24/19   Addendum  I have seen the patient, examined him. I agree with the assessment and and plan and have edited the notes.   Pt is well-known to me, and was recently hospitalized for failure to thrive at home. He is not a candidate for cancer treatment anymore, and I have strongly recommended hospice and comfort care. Patient and his children have repeatly declined hospice, and insist on full CODE STATUS. He currently needs 24/7 care and his wife is not physically and mentally capable to offer all the care he needs, based on his home care nurse's assement (I spoke with [facility] home care nurse today). His daughter and son do not live with him and has not been able to care him at home.  Pt continues to decline, not eating or drinking much at home, he was lethargic and disoriented when I saw him in ED. Lab reviewed.   I called his son, and reviewed the situation and my recommendations again. He agrees with the goal of care is palliative, but he insists on iv hydration, and nutrition support etc, and again declined hospice. At this point, I think the most appropriate place for patient to go is residential hospice, but unfortunately patient's children are not agreeable.   I will f/u if he gets admitted. If his children are willing to take him home after hydration and supportive care, I can request home care service to offer home IVF 2-3 times a week, if his children wants.   I tried to call  patient's daughter [name], was not able to reach her or leave a voice message.  Truitt Merle  12/24/2019

## 2019-12-24 NOTE — H&P (Addendum)
History and Physical:    Jeremy Mack   SAY:[id number] DOB: 03-06-1946 DOA: 12/24/2019  Referring MD/provider: Dr. Roslynn Amble PCP: [name], [place]   Patient coming from: Home  Chief Complaint: Failure to thrive  History of Present Illness:   Jeremy Mack is an 74 y.o. male with PMH significant for widely metastatic lung cancer with no further treatment options who was recently discharged 5 days ago after treatment for dehydration and failure to thrive who now presents with lethargy, weakness, inability to take p.o.'s and ongoing failure to thrive.  Patient had previously been referred to hospice however during his last hospital stay, family rescinded his DNR and felt like he deserved more aggressive treatment.  They preferred placement in a skilled nursing facility rather than a hospice facility but due to no beds being available they did take him home.  However at home patient has been unable to take adequate intake for his needs.  He has had increasing weakness and dehydration and palliative care NP today noted low blood pressure and tachycardia.  Family requested transfer the hospital for further care. History as per ED provider as well as chart review.  Patient himself is unable to provide too much of a history due to extremely dry mucous membranes and lethargy.  He states he is feeling sick.  He states "my kidneys are bad".  He admits that he has not been able to drink or eat very much at all.  Admits to feeling very weak.  ED Course:  The patient was noted to be very dehydrated with tachycardia and mild AKI.  Tachycardia improved with 1 L fluid resuscitation.  She was seen by [name] who follows him closely.  She has spoken again with patient's son who is requesting placement for his father because his stepmother cannot take care of patient.  She again recommended hospice care but patient son declined and wanted to continue aggressive measures in particular IV fluid resuscitation.   She thought that he might be able to go home after IV fluid resuscitation tonight however patient is clearly weak and ED physician did not feel comfortable sending him home.  I agree he may need more hydration overnight than can be given in the ED.  ROS:   ROS   Review of Systems: Unable to provide  Past Medical History:   Past Medical History:  Diagnosis Date  . Cancer ([place])   . GERD (gastroesophageal reflux disease)   . Hyperlipemia   . Hypertension     Past Surgical History:   Past Surgical History:  Procedure Laterality Date  . arm fracture surgery Right   . colon polyps removed    . ESOPHAGOGASTRODUODENOSCOPY (EGD) WITH PROPOFOL N/A 06/14/2018   Procedure: ESOPHAGOGASTRODUODENOSCOPY (EGD) WITH PROPOFOL;  Surgeon: [name], MD;  Location: [facility];  Service: Endoscopy;  Laterality: N/A;  . EUS N/A 06/14/2018   Procedure: ESOPHAGEAL ENDOSCOPIC ULTRASOUND (EUS) RADIAL;  Surgeon: [name], MD;  Location: [facility];  Service: Endoscopy;  Laterality: N/A;  . FINE NEEDLE ASPIRATION N/A 06/14/2018   Procedure: FINE NEEDLE ASPIRATION (FNA) LINEAR;  Surgeon: [name], MD;  Location: [facility];  Service: Endoscopy;  Laterality: N/A;  . IR IMAGING GUIDED PORT INSERTION  08/26/2019  . TONSILLECTOMY      Social History:   Social History   Socioeconomic History  . Marital status: Married    Spouse name: Not on file  . Number of children: 4  . Years  of education: 14  . Highest education level: Not on file  Occupational History  . Occupation: Retired  Tobacco Use  . Smoking status: Current Every Day Smoker    Packs/day: 0.50    Types: Cigars  . Smokeless tobacco: Never Used  . Tobacco comment: Trying to quit  Substance and Sexual Activity  . Alcohol use: Yes    Alcohol/week: 0.0 standard drinks    Comment: Occasionally  . Drug use: No  . Sexual activity: Not on file  Other Topics Concern  . Not on file  Social History Narrative   Fun:  Swimming, and outdoor sports   Denies religious beliefs effecting health care.    Social Determinants of Health   Financial Resource Strain:   . Difficulty of Paying Living Expenses: Not on file  Food Insecurity: No Food Insecurity  . Worried About Charity fundraiser in the Last Year: Never true  . Ran Out of Food in the Last Year: Never true  Transportation Needs: No Transportation Needs  . Lack of Transportation (Medical): No  . Lack of Transportation (Non-Medical): No  Physical Activity:   . Days of Exercise per Week: Not on file  . Minutes of Exercise per Session: Not on file  Stress:   . Feeling of Stress : Not on file  Social Connections:   . Frequency of Communication with Friends and Family: Not on file  . Frequency of Social Gatherings with Friends and Family: Not on file  . Attends Religious Services: Not on file  . Active Member of Clubs or Organizations: Not on file  . Attends Archivist Meetings: Not on file  . Marital Status: Not on file  Intimate Partner Violence:   . Fear of Current or Ex-Partner: Not on file  . Emotionally Abused: Not on file  . Physically Abused: Not on file  . Sexually Abused: Not on file    Allergies   Patient has no known allergies.  Family history:   Family History  Problem Relation Age of Onset  . Diabetes Mother   . Diabetes Maternal Grandmother     Current Medications:   Prior to Admission medications   Medication Sig Start Date End Date Taking? Authorizing Provider  aspirin 81 MG chewable tablet Chew 81 mg by mouth daily.   Yes [provider]  acetaminophen (TYLENOL) 325 MG tablet Take 2 tablets (650 mg total) by mouth every 6 (six) hours as needed for mild pain (or Fever >/= 101). Patient not taking: Reported on 12/24/2019 12/19/19   Maryanna Shape, NP  allopurinol (ZYLOPRIM) 300 MG tablet Take 1 tablet (300 mg total) by mouth 2 (two) times daily. Patient not taking: Reported on 12/24/2019 11/01/19    [name], FNP  amLODipine (NORVASC) 10 MG tablet Take 1 tablet (10 mg total) by mouth daily. Patient not taking: Reported on 12/11/2019 11/01/19   [name], FNP  apixaban (ELIQUIS) 5 MG TABS tablet Take 1 tablet (5 mg total) by mouth 2 (two) times daily. Patient not taking: Reported on 12/11/2019 11/04/19 03/03/20  Truitt Merle, MD  haloperidol (HALDOL) 0.5 MG tablet Take 1 tablet (0.5 mg total) by mouth every 4 (four) hours as needed for agitation (or delirium). Patient not taking: Reported on 12/24/2019 12/19/19   Maryanna Shape, NP  lactose free nutrition (BOOST PLUS) LIQD Take 237 mLs by mouth 3 (three) times daily with meals. Patient not taking: Reported on 12/24/2019 12/19/19   Maryanna Shape,  NP  lidocaine-prilocaine (EMLA) cream Apply 1 application topically as needed. Patient not taking: Reported on 12/11/2019 09/02/19   Alla Feeling, NP  megestrol (MEGACE ES) 625 MG/5ML suspension Take 5 mLs (625 mg total) by mouth daily. Patient not taking: Reported on 12/13/2019 12/11/19   Truitt Merle, MD  Multiple Vitamin (MULTIVITAMIN WITH MINERALS) TABS tablet Take 1 tablet by mouth daily. Patient not taking: Reported on 12/24/2019 12/20/19   Maryanna Shape, NP  ondansetron (ZOFRAN) 8 MG tablet Take 1 tablet (8 mg total) by mouth 2 (two) times daily as needed for refractory nausea / vomiting. Start on day 3 after carboplatin chemo. Patient not taking: Reported on 12/11/2019 06/06/19   Truitt Merle, MD  pantoprazole (PROTONIX) 20 MG tablet Take 1 tablet (20 mg total) by mouth daily. Patient not taking: Reported on 12/11/2019 11/01/19   [name], FNP  prochlorperazine (COMPAZINE) 10 MG tablet Take 1 tablet (10 mg total) by mouth every 6 (six) hours as needed (Nausea or vomiting). Patient not taking: Reported on 12/13/2019 06/06/19   Truitt Merle, MD  senna-docusate (SENOKOT-S) 8.6-50 MG tablet Take 1 tablet by mouth at bedtime as needed for mild constipation. Patient not taking:  Reported on 12/24/2019 12/19/19   Maryanna Shape, NP  traMADol (ULTRAM) 50 MG tablet Take 1 tablet (50 mg total) by mouth every 6 (six) hours as needed for moderate pain. Patient not taking: Reported on 12/11/2019 11/26/19   Truitt Merle, MD    Physical Exam:   Vitals:   12/24/19 1550 12/24/19 1645 12/24/19 1715 12/24/19 1800  BP: (!) 146/79 102/74 120/86 118/84  Pulse:  (!) 124    Resp: (!) 22 (!) 23 (!) 23   Temp:      TempSrc:      SpO2:  98%    Weight:      Height:         Physical Exam: Blood pressure 118/84, pulse (!) 124, temperature 97.9 F (36.6 C), temperature source Oral, resp. rate (!) 23, height 6' (1.829 m), weight 63.5 kg, SpO2 98 %. Gen: Emaciated, chronically ill-appearing patient with extremely dry mucous membranes lying in bed with diaper on having difficulty speaking due to very dry mouth.  Able to answer brief questions.  Oriented to person and place. CVS: Tachy, S1 and S2 with S3. Respiratory: Decreased inspiratory effort with decreased air entry bilaterally GI: NABS, scaphoid, soft, NT, ND,  LE: No edema. No cyanosis   Data Review:    Labs: Basic Metabolic Panel: Recent Labs  Lab 12/18/19 0435 12/24/19 1429  NA 136 142  K 3.8 5.0  CL 104 107  CO2 20* 20*  GLUCOSE 102* 132*  BUN 9 58*  CREATININE 0.84 1.67*  CALCIUM 10.8* 11.8*   Liver Function Tests: Recent Labs  Lab 12/18/19 0435 12/24/19 1429  AST 70* 93*  ALT 46* 53*  ALKPHOS 275* 260*  BILITOT 0.7 1.1  PROT 7.1 7.8  ALBUMIN 3.4* 3.1*   No results for input(s): LIPASE, AMYLASE in the last 168 hours. No results for input(s): AMMONIA in the last 168 hours. CBC: Recent Labs  Lab 12/18/19 0435 12/24/19 1429  WBC 5.5 8.0  NEUTROABS 2.9 4.3  HGB 11.6* 11.8*  HCT 35.2* 36.2*  MCV 88.2 88.9  PLT 75* 54*   Cardiac Enzymes: No results for input(s): CKTOTAL, CKMB, CKMBINDEX, TROPONINI in the last 168 hours.  BNP (last 3 results) No results for input(s): PROBNP in the last 8760  hours. CBG:  No results for input(s): GLUCAP in the last 168 hours.  Urinalysis    Component Value Date/Time   COLORURINE YELLOW 10/01/2019 1402   APPEARANCEUR CLEAR 10/01/2019 1402   LABSPEC 1.017 10/01/2019 1402   PHURINE 6.0 10/01/2019 1402   GLUCOSEU NEGATIVE 10/01/2019 1402   HGBUR NEGATIVE 10/01/2019 1402   BILIRUBINUR NEGATIVE 10/01/2019 1402   KETONESUR NEGATIVE 10/01/2019 1402   PROTEINUR NEGATIVE 10/01/2019 1402   NITRITE NEGATIVE 10/01/2019 1402   LEUKOCYTESUR NEGATIVE 10/01/2019 1402      Radiographic Studies: DG Chest Portable 1 View  Result Date: 12/24/2019 CLINICAL DATA:  Hypotension, dehydration EXAM: PORTABLE CHEST 1 VIEW COMPARISON:  CT 11/28/2019 FINDINGS: Right-sided chest port remains with distal tip terminating at the level of the distal SVC. Heart size is normal. Enlargement of the right paratracheal stripe compatible with known mediastinal adenopathy. Lungs are slightly hyperexpanded. Nodular density projects over the peripheral aspect of the right upper lobe and right perihilar regions compatible with known nodules. No new focal airspace consolidation. No pleural effusion. There is a vertical skin fold overlying the peripheral aspect of the right hemithorax with lung markings extending beyond. No evidence of pneumothorax. IMPRESSION: 1. No acute cardiopulmonary abnormality. 2. Right upper lobe and right perihilar nodular densities compatible with known lung nodules. 3. Enlargement of the right paratracheal stripe compatible with known mediastinal adenopathy. Electronically Signed   By: Davina Poke D.O.   On: 12/24/2019 16:11    EKG: Independently reviewed.  Sinus tach at 130.  Pulmonology.  Nonspecific ST-T wave changes.  Normal axis.  QTC is 562.   Assessment/Plan:   Principal Problem:   Stage 4 lung cancer (HCC) Active Problems:   Essential hypertension   AKI (acute kidney injury) ([place])   Failure to thrive in adult   Dehydration  74 year old  unfortunate man with end-stage lung cancer, stage IV with no further treatment options who is readmitted for ongoing failure to thrive and inability to maintain hydration at home.  Patient is followed closely by [name] who continues to have end-of-life/goals for care discussion with the family.  Family continues to decline hospice and has rescinded his DNR status. We are admitting patient for dehydration and failure to thrive.  DEHYDRATION Marked elevation in BUN with rise in creatinine, initial hypotension and tachycardia improved with hydration all c/w dehydration.  We will treat with ongoing aggressive IV fluid resuscitation Normal saline to 50 cc an hour for 2 L requested, thereafter will request 150 cc an hour. Echocardiogram done November 2020 shows EF of 60 to 65% with grade 1 diastolic dysfunction.  PROLONGED QT Although patient is prescribed multiple meds which prolong QT, patient is apparently not taking any of them per med rec/family report. Given little to no p.o. intake, will check magnesium tonight and supplement as warranted Potassium is high normal at 5.0  STAGE 4 LUNG CA No further treatment options available per [name]. She was going to follow patient as an outpatient however since patient was admitted, she will need to be notified that patient is in house so she can follow him as warranted.  HTN Holding all antihypertensives given relatively low blood pressure  CEREBROVASCULAR DISEASE Status post right thalamic stroke November 2020 Continue aspirin Xarelto being held, patient was not getting it at home per med rec.  DISPOSITION Ultimate disposition and goals for care of this patient need continued conversation with family. [name] knows family well.     Other information:   DVT prophylaxis: SCD, platelets  less than 100 Code Status: Full code per multiple discussions with family per [name] Family Communication: [name] and Dr. Bunnie Philips spoke with family at  length earlier today Disposition Plan: TBD Consults called: Oncology, [name] Admission status: Inpatient  [facility]  If 7PM-7AM, please contact night-coverage [email or web address] Password [facility] 12/24/2019, 6:34 PM

## 2019-12-24 NOTE — Telephone Encounter (Signed)
Received TC from palliative NP [facility] working with patient. She stated that patients family have declined hospice and palliative care, that the patient is very weak and dehydrated BP very low. Family now requesting hospital care and for patient to be full code. Patient was sent to [facility] NP aware and gave her the number for [facility] NP if she had any questions.

## 2019-12-24 NOTE — ED Triage Notes (Signed)
Pt arrives via EMS from home sent out by [facility] PA who was on scene for eval of dehydration and possible placement to skilled nursing facility. Pt currently palliative care for lung cancer. Daughter is POA, wife is not, per EMS. Wife would like update on patient once available. VSS. Alert, oriented x4.

## 2019-12-24 NOTE — ED Provider Notes (Signed)
[facility] EMERGENCY DEPARTMENT Provider Note   CSN: [id number] Arrival date & time: 12/24/19  1428     History Chief Complaint  Patient presents with  . Dehydration  . Hypotension    Jeremy Mack is a 74 y.o. male.  Metastatic lung cancer presented to ER with concern for dehydration.  Patiently recently discharged from hospital for similar, concern for failure to thrive.  Per family concern that patient not able to get adequate care at home, has not been eating eating or drinking regularly.  Patient states that he feels generally weak, but denies any acute complaints, specifically he denies having a fever, denies chest pain, abdominal pain, nausea or vomiting.   History obtained from patient, son, [name] and chart review.   HPI     Past Medical History:  Diagnosis Date  . Cancer ([place])   . GERD (gastroesophageal reflux disease)   . Hyperlipemia   . Hypertension     Patient Active Problem List   Diagnosis Date Noted  . Failure to thrive (0-17)   . Metastatic lung cancer (metastasis from lung to other site) ([place]) 12/13/2019  . Failure to thrive in adult 12/13/2019  . Hypercalcemia 12/04/2019  . Tumor lysis syndrome 11/28/2019  . Acute ischemic stroke ([place]) 10/01/2019  . Primary malignant neoplasm of bronchus of left upper lobe ([place]) 06/06/2019  . Goals of care, counseling/discussion 06/06/2019  . Pancreatic abscess   . Malnutrition of moderate degree 06/14/2018  . Pancreatic mass   . Abnormal CT of the abdomen   . Abdominal pain 06/12/2018  . Leukocytosis 06/12/2018  . AKI (acute kidney injury) ([place]) 06/12/2018  . Medicare annual wellness visit, subsequent 09/09/2016  . Routine general medical examination at a health care facility 09/09/2016  . Hyperlipidemia 09/07/2015  . Neck stiffness 05/20/2015  . Hip pain, chronic 05/20/2015  . Essential hypertension 05/20/2015    Past Surgical History:  Procedure Laterality Date  . arm fracture  surgery Right   . colon polyps removed    . ESOPHAGOGASTRODUODENOSCOPY (EGD) WITH PROPOFOL N/A 06/14/2018   Procedure: ESOPHAGOGASTRODUODENOSCOPY (EGD) WITH PROPOFOL;  Surgeon: [name], MD;  Location: [facility];  Service: Endoscopy;  Laterality: N/A;  . EUS N/A 06/14/2018   Procedure: ESOPHAGEAL ENDOSCOPIC ULTRASOUND (EUS) RADIAL;  Surgeon: [name], MD;  Location: [facility];  Service: Endoscopy;  Laterality: N/A;  . FINE NEEDLE ASPIRATION N/A 06/14/2018   Procedure: FINE NEEDLE ASPIRATION (FNA) LINEAR;  Surgeon: [name], MD;  Location: [facility];  Service: Endoscopy;  Laterality: N/A;  . IR IMAGING GUIDED PORT INSERTION  08/26/2019  . TONSILLECTOMY         Family History  Problem Relation Age of Onset  . Diabetes Mother   . Diabetes Maternal Grandmother     Social History   Tobacco Use  . Smoking status: Current Every Day Smoker    Packs/day: 0.50    Types: Cigars  . Smokeless tobacco: Never Used  . Tobacco comment: Trying to quit  Substance Use Topics  . Alcohol use: Yes    Alcohol/week: 0.0 standard drinks    Comment: Occasionally  . Drug use: No    Home Medications Prior to Admission medications   Medication Sig Start Date End Date Taking? Authorizing Provider  aspirin 81 MG chewable tablet Chew 81 mg by mouth daily.   Yes [provider]  acetaminophen (TYLENOL) 325 MG tablet Take 2 tablets (650 mg total) by mouth every 6 (six) hours  as needed for mild pain (or Fever >/= 101). Patient not taking: Reported on 12/24/2019 12/19/19   Maryanna Shape, NP  allopurinol (ZYLOPRIM) 300 MG tablet Take 1 tablet (300 mg total) by mouth 2 (two) times daily. Patient not taking: Reported on 12/24/2019 11/01/19   [name], FNP  amLODipine (NORVASC) 10 MG tablet Take 1 tablet (10 mg total) by mouth daily. Patient not taking: Reported on 12/11/2019 11/01/19   [name], FNP  apixaban (ELIQUIS) 5 MG TABS tablet Take 1  tablet (5 mg total) by mouth 2 (two) times daily. Patient not taking: Reported on 12/11/2019 11/04/19 03/03/20  Truitt Merle, MD  haloperidol (HALDOL) 0.5 MG tablet Take 1 tablet (0.5 mg total) by mouth every 4 (four) hours as needed for agitation (or delirium). Patient not taking: Reported on 12/24/2019 12/19/19   Maryanna Shape, NP  lactose free nutrition (BOOST PLUS) LIQD Take 237 mLs by mouth 3 (three) times daily with meals. Patient not taking: Reported on 12/24/2019 12/19/19   Maryanna Shape, NP  lidocaine-prilocaine (EMLA) cream Apply 1 application topically as needed. Patient not taking: Reported on 12/11/2019 09/02/19   Alla Feeling, NP  megestrol (MEGACE ES) 625 MG/5ML suspension Take 5 mLs (625 mg total) by mouth daily. Patient not taking: Reported on 12/13/2019 12/11/19   Truitt Merle, MD  Multiple Vitamin (MULTIVITAMIN WITH MINERALS) TABS tablet Take 1 tablet by mouth daily. Patient not taking: Reported on 12/24/2019 12/20/19   Maryanna Shape, NP  ondansetron (ZOFRAN) 8 MG tablet Take 1 tablet (8 mg total) by mouth 2 (two) times daily as needed for refractory nausea / vomiting. Start on day 3 after carboplatin chemo. Patient not taking: Reported on 12/11/2019 06/06/19   Truitt Merle, MD  pantoprazole (PROTONIX) 20 MG tablet Take 1 tablet (20 mg total) by mouth daily. Patient not taking: Reported on 12/11/2019 11/01/19   [name], FNP  prochlorperazine (COMPAZINE) 10 MG tablet Take 1 tablet (10 mg total) by mouth every 6 (six) hours as needed (Nausea or vomiting). Patient not taking: Reported on 12/13/2019 06/06/19   Truitt Merle, MD  senna-docusate (SENOKOT-S) 8.6-50 MG tablet Take 1 tablet by mouth at bedtime as needed for mild constipation. Patient not taking: Reported on 12/24/2019 12/19/19   Maryanna Shape, NP  traMADol (ULTRAM) 50 MG tablet Take 1 tablet (50 mg total) by mouth every 6 (six) hours as needed for moderate pain. Patient not taking: Reported on 12/11/2019 11/26/19   Truitt Merle, MD    Allergies    Patient has no known allergies.  Review of Systems   Review of Systems  Constitutional: Positive for fatigue. Negative for chills and fever.  HENT: Negative for ear pain and sore throat.   Eyes: Negative for pain and visual disturbance.  Respiratory: Negative for cough and shortness of breath.   Cardiovascular: Negative for chest pain and palpitations.  Gastrointestinal: Negative for abdominal pain and vomiting.  Genitourinary: Negative for dysuria and hematuria.  Musculoskeletal: Negative for arthralgias and back pain.  Skin: Negative for color change and rash.  Neurological: Negative for seizures and syncope.  All other systems reviewed and are negative.   Physical Exam Updated Vital Signs BP 118/84   Pulse (!) 124   Temp 97.9 F (36.6 C) (Oral)   Resp (!) 23   Ht 6' (1.829 m)   Wt 63.5 kg   SpO2 98%   BMI 18.99 kg/m   Physical Exam Vitals and nursing  note reviewed.  Constitutional:      Appearance: He is well-developed.     Comments: Chronically ill-appearing but no acute distress  HENT:     Head: Normocephalic and atraumatic.  Eyes:     Conjunctiva/sclera: Conjunctivae normal.  Cardiovascular:     Rate and Rhythm: Regular rhythm. Tachycardia present.     Heart sounds: No murmur.  Pulmonary:     Effort: Pulmonary effort is normal. No respiratory distress.     Breath sounds: Normal breath sounds.  Abdominal:     Palpations: Abdomen is soft.     Tenderness: There is no abdominal tenderness.  Musculoskeletal:     Cervical back: Neck supple.  Skin:    General: Skin is warm and dry.  Neurological:     General: No focal deficit present.     Mental Status: He is alert. Mental status is at baseline.     ED Results / Procedures / Treatments   Labs (all labs ordered are listed, but only abnormal results are displayed) Labs Reviewed  CBC WITH DIFFERENTIAL/PLATELET - Abnormal; Notable for the following components:      Result Value    RBC 4.07 (*)    Hemoglobin 11.8 (*)    HCT 36.2 (*)    Platelets 54 (*)    nRBC 2.4 (*)    Abs Immature Granulocytes 0.42 (*)    All other components within normal limits  COMPREHENSIVE METABOLIC PANEL - Abnormal; Notable for the following components:   CO2 20 (*)    Glucose, Bld 132 (*)    BUN 58 (*)    Creatinine, Ser 1.67 (*)    Calcium 11.8 (*)    Albumin 3.1 (*)    AST 93 (*)    ALT 53 (*)    Alkaline Phosphatase 260 (*)    GFR calc non Af Amer 41 (*)    GFR calc Af Amer 47 (*)    All other components within normal limits  LACTIC ACID, PLASMA - Abnormal; Notable for the following components:   Lactic Acid, Venous 3.0 (*)    All other components within normal limits  BRAIN NATRIURETIC PEPTIDE  PROTIME-INR  URINALYSIS, ROUTINE W REFLEX MICROSCOPIC  LACTIC ACID, PLASMA  TROPONIN I (HIGH SENSITIVITY)  TROPONIN I (HIGH SENSITIVITY)    EKG EKG Interpretation  Date/Time:  Tuesday December 24 2019 14:29:12 EST Ventricular Rate:  133 PR Interval:    QRS Duration: 70 QT Interval:  378 QTC Calculation: 562 R Axis:   25 Text Interpretation: Sinus tachycardia Prolonged QT no acute STEMI Confirmed by Madalyn Rob [phone]) on 12/24/2019 3:44:45 PM   Radiology DG Chest Portable 1 View  Result Date: 12/24/2019 CLINICAL DATA:  Hypotension, dehydration EXAM: PORTABLE CHEST 1 VIEW COMPARISON:  CT 11/28/2019 FINDINGS: Right-sided chest port remains with distal tip terminating at the level of the distal SVC. Heart size is normal. Enlargement of the right paratracheal stripe compatible with known mediastinal adenopathy. Lungs are slightly hyperexpanded. Nodular density projects over the peripheral aspect of the right upper lobe and right perihilar regions compatible with known nodules. No new focal airspace consolidation. No pleural effusion. There is a vertical skin fold overlying the peripheral aspect of the right hemithorax with lung markings extending beyond. No evidence of  pneumothorax. IMPRESSION: 1. No acute cardiopulmonary abnormality. 2. Right upper lobe and right perihilar nodular densities compatible with known lung nodules. 3. Enlargement of the right paratracheal stripe compatible with known mediastinal adenopathy. Electronically Signed   By: Hart Carwin  Plundo D.O.   On: 12/24/2019 16:11    Procedures Procedures (including critical care time)  Medications Ordered in ED Medications  sodium chloride flush (NS) 0.9 % injection 10-40 mL (has no administration in time range)  sodium chloride flush (NS) 0.9 % injection 10-40 mL (has no administration in time range)  Chlorhexidine Gluconate Cloth 2 % PADS 6 each (has no administration in time range)  sodium chloride 0.9 % bolus 1,000 mL (1,000 mLs Intravenous New Bag/Given 12/24/19 1658)    ED Course  I have reviewed the triage vital signs and the nursing notes.  Pertinent labs & imaging results that were available during my care of the patient were reviewed by me and considered in my medical decision making (see chart for details).  Clinical Course as of Dec 23 1850  Tue Dec 24, 2019  1612 Reviewed his case in detail with [name], patient's oncologist who evaluated patient   [RD]  1748 DG Chest Portable 1 View [RD]  1809 Provided update to patient's son, confirms full CODE STATUS, does not feel comfortable taking patient home   [RD]    Clinical Course User Index [RD] Lucrezia Starch, MD   MDM Rules/Calculators/A&P                      74 year old male metastatic lung cancer presents ER with concern for dehydration, poor p.o. intake, failure to thrive.  On assessment patient is chronically ill-appearing but in no acute distress.  Noted sinus tachycardia, but otherwise stable vital signs.  He is afebrile.  Labs concerning for AKI.  Provided fluid bolus.  Reviewed with oncology who saw and evaluated patient.  Recommended IV rehydration and okay with either admission or discharge and recheck in clinic  tomorrow.  Reviewed case with patient's son, home health aide not coming as expected, concern patient needs more care than he is able to get at home currently.  Given this and his dehydration, will admit to hospitalist for further management.  Dr. Jamse Arn will accept.  Final Clinical Impression(s) / ED Diagnoses Final diagnoses:  Dehydration  AKI (acute kidney injury) [facility])    Rx / DC Orders ED Discharge Orders    None       Lucrezia Starch, MD 12/24/19 1857

## 2019-12-24 NOTE — Progress Notes (Signed)
[facility] Consult Note Telephone: [phone]  Fax: [phone]  PATIENT NAME: Jeremy Mack DOB: 19-Feb-1946 MRN: [id number]  PRIMARY CARE PROVIDER:   [name], FNP  REFERRING PROVIDER:  [name]  RESPONSIBLE PARTY:   HCPOA son Jeremy Mack                                              Jeremy Mack( wife)  RECOMMENDATIONS and PLAN:   Palliative Care Encounter  Z51.5  1.  Advance care planning:  Detailed explanation of Palliative and Hospice care to wife, daughter Jeremy Mack and son Jeremy Mack(present via phone).  Patient had a Hospice admission visit on 12/21/19 but Hospice was declined by son and daughter.  Goals, per daughter and son, are for patient to "get better" and attempt nursing home placement to provide more care including physical therapy. FULL code status is still desired.  Wife desires [place] admission for comfort at end of life.  [place] was declined because it does not align with goals for improvement and more aggressive therapies.  Doubtful that goals are attainable for long-term. Phoned Jeremy Rue, NP/[name] and given update on today's pt status through clinical assistant [place].  Will contact hospital liason for f/u with patient.  Encouraged family to work together for patients well being.  Palliative care will followup as needed.    2.  Lung cancer with metastasis:  No plans for additional cancer treatments.  Patient is very ill today.  3.  Debilitation: Related to terminal cancer, dehydration, protein calorie malnutrition. Patient appears to be nearing end of life.  Stressed importance of family assistance with direct/hands on care of patient. (Wife is direct caregiver at this time).  Pt is unable to care for self or make safe decisions.  Re-introduced inpatient Hospice care but family declined.  Son and daughter chose to call EMS today for transport back to the hospital for evaluation, hydration and probable admission.   Their desire is for nursing home placement. Provider remained in the home and reported to paramedics when they arrived.   4.  Dysphagia:  Aspiration risks reviewed. Oral intake while patient is completely upright.  Would consider oral care and comfort feedings only.  I spent 75 minutes providing this consultation,  from 1215 to 1330. More than 50% of the time in this consultation was spent coordinating communication with patient, wife, daughter and son(via phone).   HISTORY OF PRESENT ILLNESS:  Jeremy Mack is a 74 y.o. year old male with multiple medical problems including lung cancer with metastasis to bone, liver per medical record review. Pt has also had a CVA.  Wife reports that patient has been able to eat only bites of food and drinks only sips of liquids since being discharged from hospital.  He is incontinent of B&B, is non-ambulatory and at best rolled in an office chair with attempts to get to the restroom.  He has weakness and difficulty with swallowing.  Patient was evaluated at home for a Hospice admission but son and daughter declined same.  Palliative Care was asked to help address goals of care.   CODE STATUS: FULL CODE  PPS: 20%  Sips and bites.  Non-ambulatory  HOSPICE ELIGIBILITY/DIAGNOSIS: YES/  Metastatic Lung cancer.  Declined by son and daughter  PAST MEDICAL HISTORY:  Past Medical History:  Diagnosis Date  .  Cancer ([place])   . GERD (gastroesophageal reflux disease)   . Hyperlipemia   . Hypertension    Lung cancer with metastasis  SOCIAL HX:  Social History   Tobacco Use  . Smoking status: Current Every Day Smoker    Packs/day: 0.50    Types: Cigars  . Smokeless tobacco: Never Used  . Tobacco comment: Trying to quit  Substance Use Topics  . Alcohol use: Yes    Alcohol/week: 0.0 standard drinks    Comment: Occasionally    ALLERGIES: No Known Allergies    PERTINENT MEDICATIONS:  Outpatient Encounter Medications as of 12/24/2019  Medication Sig  .  acetaminophen (TYLENOL) 325 MG tablet Take 2 tablets (650 mg total) by mouth every 6 (six) hours as needed for mild pain (or Fever >/= 101).  Marland Kitchen allopurinol (ZYLOPRIM) 300 MG tablet Take 1 tablet (300 mg total) by mouth 2 (two) times daily.  Marland Kitchen amLODipine (NORVASC) 10 MG tablet Take 1 tablet (10 mg total) by mouth daily. (Patient not taking: Reported on 12/11/2019)  . apixaban (ELIQUIS) 5 MG TABS tablet Take 1 tablet (5 mg total) by mouth 2 (two) times daily. (Patient not taking: Reported on 12/11/2019)  . aspirin 81 MG chewable tablet Chew 81 mg by mouth daily.  . haloperidol (HALDOL) 0.5 MG tablet Take 1 tablet (0.5 mg total) by mouth every 4 (four) hours as needed for agitation (or delirium).  Marland Kitchen lactose free nutrition (BOOST PLUS) LIQD Take 237 mLs by mouth 3 (three) times daily with meals.  . lidocaine-prilocaine (EMLA) cream Apply 1 application topically as needed. (Patient not taking: Reported on 12/11/2019)  . megestrol (MEGACE ES) 625 MG/5ML suspension Take 5 mLs (625 mg total) by mouth daily. (Patient not taking: Reported on 12/13/2019)  . Multiple Vitamin (MULTIVITAMIN WITH MINERALS) TABS tablet Take 1 tablet by mouth daily.  . ondansetron (ZOFRAN) 8 MG tablet Take 1 tablet (8 mg total) by mouth 2 (two) times daily as needed for refractory nausea / vomiting. Start on day 3 after carboplatin chemo. (Patient not taking: Reported on 12/11/2019)  . pantoprazole (PROTONIX) 20 MG tablet Take 1 tablet (20 mg total) by mouth daily. (Patient not taking: Reported on 12/11/2019)  . prochlorperazine (COMPAZINE) 10 MG tablet Take 1 tablet (10 mg total) by mouth every 6 (six) hours as needed (Nausea or vomiting). (Patient not taking: Reported on 12/13/2019)  . senna-docusate (SENOKOT-S) 8.6-50 MG tablet Take 1 tablet by mouth at bedtime as needed for mild constipation.  . traMADol (ULTRAM) 50 MG tablet Take 1 tablet (50 mg total) by mouth every 6 (six) hours as needed for moderate pain. (Patient not taking:  Reported on 12/11/2019)   No facility-administered encounter medications on file as of 12/24/2019.    PHYSICAL EXAM:   BP: Palpated 92/60  P 122 R 24  Sats 94% room air  General: Cachectic, chronically ill and frail appearing EENT:  Dry mucus membranes. Glazed eyes Cardiovascular: rapid rate and regular rhythm Pulmonary: diminished breath sounds throughout.  Coughing immediately after drinking beverage. Beverage running from mouth Abdomen: soft, nontender, hypoactive bowel sounds GU: no suprapubic tenderness Extremities: no muscle mass Skin: poor skin turgor Neurological: Alert but confused and disoriented.  Oriented only to person. Speaks in only 1-2 words. Extremely weak.  This provider had to physically place patient in bed from a kneeling position.   Gonzella Lex, NP-C

## 2019-12-24 NOTE — ED Notes (Signed)
Son POA requesting to change to another home health agency

## 2019-12-24 NOTE — Care Management (Signed)
ED CM noted TOC consult, reviewed patient's record. Patient lives at home with wife as primary caregiver, he is active with [facility] community care management  Patient was recently discharged from hospital with [facility]  services and a referral for hospice and palliative for end of life services, but  apparently the services were declined by patient' son (POA) and daughter.  Son is requesting another [facility] agency. ED assessment  determined hospital stay for further evaluation patient is  CM will notify [facility] RNCM.Marland Kitchen TOC will continue to follow to assist with  transitional care planning

## 2019-12-25 ENCOUNTER — Other Ambulatory Visit: Payer: Self-pay

## 2019-12-25 ENCOUNTER — Encounter (HOSPITAL_COMMUNITY): Payer: Self-pay | Admitting: Internal Medicine

## 2019-12-25 ENCOUNTER — Inpatient Hospital Stay: Payer: Self-pay

## 2019-12-25 LAB — BASIC METABOLIC PANEL
Anion gap: 12 (ref 5–15)
BUN: 39 mg/dL — ABNORMAL HIGH (ref 8–23)
CO2: 18 mmol/L — ABNORMAL LOW (ref 22–32)
Calcium: 10.1 mg/dL (ref 8.9–10.3)
Chloride: 114 mmol/L — ABNORMAL HIGH (ref 98–111)
Creatinine, Ser: 1.12 mg/dL (ref 0.61–1.24)
GFR calc Af Amer: >60 mL/min (ref >60)
GFR calc non Af Amer: >60 mL/min (ref >60)
Glucose, Bld: 97 mg/dL (ref 70–99)
Potassium: 3.9 mmol/L (ref 3.5–5.1)
Sodium: 144 mmol/L (ref 135–145)

## 2019-12-25 LAB — SARS CORONAVIRUS 2 (TAT 6-24 HRS): SARS Coronavirus 2: NEGATIVE

## 2019-12-25 LAB — CBC
HCT: 27.9 % — ABNORMAL LOW (ref 39.0–52.0)
Hemoglobin: 9.1 g/dL — ABNORMAL LOW (ref 13.0–17.0)
MCH: 29.1 pg (ref 26.0–34.0)
MCHC: 32.6 g/dL (ref 30.0–36.0)
MCV: 89.1 fL (ref 80.0–100.0)
Platelets: 34 10*3/uL — ABNORMAL LOW (ref 150–400)
RBC: 3.13 MIL/uL — ABNORMAL LOW (ref 4.22–5.81)
RDW: 15.4 % (ref 11.5–15.5)
WBC: 5.1 10*3/uL (ref 4.0–10.5)
nRBC: 1.6 % — ABNORMAL HIGH (ref 0.0–0.2)

## 2019-12-25 LAB — MAGNESIUM: Magnesium: 2.1 mg/dL (ref 1.7–2.4)

## 2019-12-25 NOTE — Progress Notes (Signed)
Initial Nutrition Assessment  DOCUMENTATION CODES:   Not applicable(suspect severe PCM)  INTERVENTION:    Boost Plus chocolate TID- Each supplement provides 360kcal and 14g protein.    Magic cup TID with meals, each supplement provides 290 kcal and 9 grams of protein  MVI daily   NUTRITION DIAGNOSIS:   Increased nutrient needs related to cancer and cancer related treatments as evidenced by estimated needs.  GOAL:   Patient will meet greater than or equal to 90% of their needs  MONITOR:   Supplement acceptance, PO intake, Labs, Weight trends, I & O's, Skin  REASON FOR ASSESSMENT:   Malnutrition Screening Tool    ASSESSMENT:   Patient with PMH significant for widely metastatic lung cancer with no further treatment options, HLD, and HTN. Discharged five days ago after treatment of FTT and dehydration. Presents this admission for dehydration.   RD working remotely.  Pt unable to provide history. Per H&P, pt has not ate well since discharge. He was initially referred to hospice but family wished for SNF. He was sent home due to insurance issues. Family wishes to continue aggressive care. Meal completion charted as 50% for breakfast. If intake does not progress, may need to have discussion with family regarding long term feeding tube as intake has remained poor for prolonged period.   Records indicate pt weighed 77 kg on 10/14/2019 and 63.5 kg this admission (17.5% wt loss in 3 months, significant for time frame). Pt is likely severely malnourished. Unable to make official diagnosis without NFPE and diet recall.   I/O: +4,540 ml since admit   Drips: NS @ 150 ml/hr  Labs: CBG 97-132  Diet Order:   Diet Order            DIET SOFT Room service appropriate? Yes; Fluid consistency: Thin  Diet effective now              EDUCATION NEEDS:   Not appropriate for education at this time  Skin:  Skin Assessment: Reviewed RN Assessment  Last BM:  PTA  Height:   Ht  Readings from Last 1 Encounters:  12/24/19 6' (1.829 m)    Weight:   Wt Readings from Last 1 Encounters:  12/24/19 63.5 kg    Ideal Body Weight:  80.9 kg  BMI:  Body mass index is 18.99 kg/m.  Estimated Nutritional Needs:   Kcal:  1950-2150 kcal  Protein:  100-120 grams  Fluid:  >/= 1.9 L/day   Mariana Single RD, LDN Clinical Nutrition Pager # - [phone]

## 2019-12-25 NOTE — Progress Notes (Addendum)
PROGRESS NOTE    Jeremy Mack  GGY:[id number] DOB: 04/06/1946 DOA: 12/24/2019 PCP: [name], FNP   Brief Narrative:  Jeremy Mack is an 74 y.o. male with PMH significant for widely metastatic lung cancer with no further treatment options who was recently discharged 5 prior to admission for treatment of dehydration and failure to thrive who now presents with lethargy, weakness, inability to take p.o.'s and ongoing failure to thrive. Previously been referred to hospice however during his last hospital stay, family rescinded his DNR and felt like he deserved more aggressive treatment.  They preferred placement in a skilled nursing facility rather than a hospice facility but due to no beds being available they did take him home.  However at home patient has been unable to take adequate intake for his needs.  He has had increasing weakness and dehydration and palliative care NP today noted low blood pressure and tachycardia.  Family requested transfer the hospital for further care. History as per ED provider as well as chart review. Patient was admitted and given 4L IVF with resolution in tachycardia and AKI.  Assessment & Plan:   Principal Problem:   Stage 4 lung cancer ([place]) Active Problems:   Essential hypertension   AKI (acute kidney injury) ([place])   Failure to thrive in adult   Dehydration  Dehydration: (resolved) AKI on admission now resolved. Patient received 4 L IVF and now on mIVF. Echocardiogram done November 2020 shows EF of 60 to 65% with grade 1 diastolic dysfunction. D/w patient's oncologist [name] who will discuss possible PICC placement with family as dehydration will likely continue to happen.  Addendum: Will order PICC line placement: SW [name] spoke with son: "And he agreed to talk to the liaison for [facility]. [facility] is agreeable to PICC line for prn fluids if needed. [facility] has already spoken with son to discuss what they can do, and son has agreed. So  still needs PICC and they will get DME in the home"  Prolonged QT Although patient is prescribed multiple meds which prolong QT, patient is apparently not taking any of them per med rec/family report. Mag and K now WNL  Stage IV Lung Ca No further treatment options available per [name]. [name] aware that patient is hospitalized and has communicated with provider today; appreciate her help as she knows situation and family well   HTN Holding all antihypertensives given relatively low blood pressure; can likely DC on discharge Goal BP < 150/100 BP Readings from Last 1 Encounters:  12/25/19 130/89   Cerebrovascular disease  Status post right thalamic stroke November 2020 Continue aspirin Xarelto being held, patient was not getting it at home per med rec.  DVT prophylaxis: SCD's only; Plt 34 Code Status: Full code per family; [name] has discussed extensively with family Family Communication: [name] is communicating with family  Disposition Plan: Hospice should family change their mind, but likely home tomorrow. PICC line ordered to be placed.   Consultants:   None.   Procedures:   None  Antimicrobials:   None    Subjective: Patient reports feeling better today. He is ready to go home whenever. Reports that his wife wants him to come home and she is his caregiver.  Objective: Vitals:   12/24/19 1912 12/24/19 1953 12/25/19 0419 12/25/19 0922  BP:  (!) 135/96 123/81 130/89  Pulse:  (!) 118 (!) 102 (!) 108  Resp:  16 16 18   Temp: 98.4 F (36.9 C) 98.3 F (  36.8 C) 97.9 F (36.6 C) 98.1 F (36.7 C)  TempSrc: Oral Oral Oral Oral  SpO2:  100% 100% 100%  Weight:      Height:        Intake/Output Summary (Last 24 hours) at 12/25/2019 1603 Last data filed at 12/25/2019 1457 Gross per 24 hour  Intake 4761.45 ml  Output 600 ml  Net 4161.45 ml   Filed Weights   12/24/19 1426  Weight: 63.5 kg    Examination:  General exam: Appears calm and comfortable.  Sitting in bed. Appears chronically ill. Respiratory system: Clear to auscultation. Respiratory effort normal. Cardiovascular system: Mild tachycardia with regular rhythm. No murmurs, rubs, gallops or clicks. No pedal edema. Gastrointestinal system: Abdomen is nondistended, soft and nontender. Normal bowel sounds heard. Central nervous system: Alert and oriented. No focal neurological deficits. Extremities: No LE edema Skin: No rashes, lesions or ulcers Psychiatry: Normal mood.    Data Reviewed: I have personally reviewed following labs and imaging studies  CBC: Recent Labs  Lab 12/24/19 1429 12/25/19 0500  WBC 8.0 5.1  NEUTROABS 4.3  --   HGB 11.8* 9.1*  HCT 36.2* 27.9*  MCV 88.9 89.1  PLT 54* 34*   Basic Metabolic Panel: Recent Labs  Lab 12/24/19 1429 12/24/19 2016 12/25/19 0500  NA 142  --  144  K 5.0  --  3.9  CL 107  --  114*  CO2 20*  --  18*  GLUCOSE 132*  --  97  BUN 58*  --  39*  CREATININE 1.67*  --  1.12  CALCIUM 11.8*  --  10.1  MG  --  2.3 2.1   GFR: Estimated Creatinine Clearance: 54.3 mL/min (by C-G formula based on SCr of 1.12 mg/dL). Liver Function Tests: Recent Labs  Lab 12/24/19 1429  AST 93*  ALT 53*  ALKPHOS 260*  BILITOT 1.1  PROT 7.8  ALBUMIN 3.1*   No results for input(s): LIPASE, AMYLASE in the last 168 hours. No results for input(s): AMMONIA in the last 168 hours. Coagulation Profile: Recent Labs  Lab 12/24/19 1630  INR 1.2   Cardiac Enzymes: No results for input(s): CKTOTAL, CKMB, CKMBINDEX, TROPONINI in the last 168 hours. BNP (last 3 results) No results for input(s): PROBNP in the last 8760 hours. HbA1C: No results for input(s): HGBA1C in the last 72 hours. CBG: No results for input(s): GLUCAP in the last 168 hours. Lipid Profile: No results for input(s): CHOL, HDL, LDLCALC, TRIG, CHOLHDL, LDLDIRECT in the last 72 hours. Thyroid Function Tests: No results for input(s): TSH, T4TOTAL, FREET4, T3FREE, THYROIDAB in the  last 72 hours. Anemia Panel: No results for input(s): VITAMINB12, FOLATE, FERRITIN, TIBC, IRON, RETICCTPCT in the last 72 hours. Sepsis Labs: Recent Labs  Lab 12/24/19 1700 12/24/19 1751  LATICACIDVEN 3.0* 2.9*    Recent Results (from the past 240 hour(s))  SARS CORONAVIRUS 2 (TAT 6-24 HRS) Nasopharyngeal Nasopharyngeal Swab     Status: None   Collection Time: 12/24/19  6:59 PM   Specimen: Nasopharyngeal Swab  Result Value Ref Range Status   SARS Coronavirus 2 NEGATIVE NEGATIVE Final    Comment: (NOTE) SARS-CoV-2 target nucleic acids are NOT DETECTED. The SARS-CoV-2 RNA is generally detectable in upper and lower respiratory specimens during the acute phase of infection. Negative results do not preclude SARS-CoV-2 infection, do not rule out co-infections with other pathogens, and should not be used as the sole basis for treatment or other patient management decisions. Negative results must be combined  with clinical observations, patient history, and epidemiological information. The expected result is Negative. Fact Sheet for Patients: SugarRoll.be Fact Sheet for Healthcare Providers: https://www.woods-mathews.com/ This test is not yet approved or cleared by the [place] FDA and  has been authorized for detection and/or diagnosis of SARS-CoV-2 by FDA under an Emergency Use Authorization (EUA). This EUA will remain  in effect (meaning this test can be used) for the duration of the COVID-19 declaration under Section 56 4(b)(1) of the Act, 21 U.S.C. section 360bbb-3(b)(1), unless the authorization is terminated or revoked sooner. Performed at [facility], [place] [address], [place], [place] [address]          Radiology Studies: DG Chest Portable 1 View  Result Date: 12/24/2019 CLINICAL DATA:  Hypotension, dehydration EXAM: PORTABLE CHEST 1 VIEW COMPARISON:  CT 11/28/2019 FINDINGS: Right-sided chest port remains with distal  tip terminating at the level of the distal SVC. Heart size is normal. Enlargement of the right paratracheal stripe compatible with known mediastinal adenopathy. Lungs are slightly hyperexpanded. Nodular density projects over the peripheral aspect of the right upper lobe and right perihilar regions compatible with known nodules. No new focal airspace consolidation. No pleural effusion. There is a vertical skin fold overlying the peripheral aspect of the right hemithorax with lung markings extending beyond. No evidence of pneumothorax. IMPRESSION: 1. No acute cardiopulmonary abnormality. 2. Right upper lobe and right perihilar nodular densities compatible with known lung nodules. 3. Enlargement of the right paratracheal stripe compatible with known mediastinal adenopathy. Electronically Signed   By: Davina Poke D.O.   On: 12/24/2019 16:11        Scheduled Meds: . aspirin  81 mg Oral Daily  . Chlorhexidine Gluconate Cloth  6 each Topical Daily  . lactose free nutrition  237 mL Oral TID WC  . sodium chloride flush  10-40 mL Intracatheter Q12H   Continuous Infusions: . sodium chloride 150 mL/hr at 12/25/19 1327     LOS: 1 day    Time spent: 30 minutes spend coordinating care, time with patient and chart review.   Chauncey Mann, MD [facility] Pager [phone]  If 7PM-7AM, please contact night-coverage [email or web address] Password [facility] 12/25/2019, 4:03 PM

## 2019-12-25 NOTE — TOC Initial Note (Signed)
Transition of Care [facility]) - Initial/Assessment Note    Patient Details  Name: Jeremy Mack MRN: [id number] Date of Birth: [birth year]  [facility]) CM/SW Contact:    Bartholomew Crews, RN Phone Number: [phone] 12/25/2019, 5:41 PM  Clinical Narrative:                 Received call from patient's son, Jeremy Mack, Jeremy Mack. Son was asking about home health services and needing an aide to assist with meal prep, feeding, and taking to the bathroom. Educated about the difference between skilled care and custodial, and that home care services to assist with feeding and to the bathroom would be custodial where an aide with skilled [facility] would assist with bathing/grooming but would not be available for supervision over an extended period of time.   Son expressed his concerns about hospice care and what he wanted to be done for his dad. Discussed son having a noncommittal conversation with a hospice agency to discuss what he wanted for his dad. Son is wanting his dad to be able to get IVF as needed and to have the option to call 911. Son agreed to having a conversation with liaison with [facility].   Referral placed with [facility] that patient's son was interested in talking with agency. Son's concerns discussed with liaison. Liaison notified NCM that approval was received to accept patient if pt/family agreeable. Liaison reached out to son to discuss what [facility] could offer. [facility] is agreeable to prn IVF - patient will need PICC line prior to discharge, [facility] cannot use portacath. Family can opt to call 911, however, [facility] requests that family reach out to [facility] first in order to help treat patient symptoms first. [facility] to get needed DME, and can accept patient when ready to dc.   TOC following for transition needs.    Expected Discharge Plan: Home w Hospice Care Barriers to Discharge: Continued Medical Work up   Patient Goals and CMS Choice Patient states their  goals for this hospitalization and ongoing recovery are:: home with hospice CMS Medicare.gov Compare Post Acute Care list provided to:: Patient Represenative (must comment) Choice offered to / list presented to : Adult Children  Expected Discharge Plan and Services Expected Discharge Plan: Home w Hospice Care In-house Referral: NA Discharge Planning Services: CM Consult Post Acute Care Choice: Hospice Living arrangements for the past 2 months: Single Family Home                 DME Arranged: N/A DME Agency: NA       HH Arranged: NA HH Agency: NA        Prior Living Arrangements/Services Living arrangements for the past 2 months: [place] Lives with:: Spouse          Need for Family Participation in Patient Care: Yes (Comment) Care giver support system in place?: Yes (comment) Current home services: DME, Home OT, Home PT, Home RN, Homehealth aide Criminal Activity/Legal Involvement Pertinent to Current Situation/Hospitalization: No - Comment as needed  Activities of Daily Living Home Assistive Devices/Equipment: None(per daughter) ADL Screening (condition at time of admission) Patient's cognitive ability adequate to safely complete daily activities?: Yes(per daughter, just lethargic. ) Is the patient deaf or have difficulty hearing?: No Does the patient have difficulty seeing, even when wearing glasses/contacts?: No Does the patient have difficulty concentrating, remembering, or making decisions?: Yes Patient able to express need for assistance with ADLs?: No Does the patient have difficulty dressing  or bathing?: Yes Independently performs ADLs?: No Communication: Independent Dressing (OT): Needs assistance Is this a change from baseline?: Change from baseline, expected to last >3 days Grooming: Needs assistance Is this a change from baseline?: Change from baseline, expected to last >3 days Feeding: Needs assistance Is this a change from baseline?: Change from  baseline, expected to last >3 days Bathing: Needs assistance Is this a change from baseline?: Change from baseline, expected to last >3 days Toileting: Needs assistance Is this a change from baseline?: Change from baseline, expected to last >3days In/Out Bed: Needs assistance Is this a change from baseline?: Change from baseline, expected to last >3 days Walks in Home: Needs assistance Is this a change from baseline?: Change from baseline, expected to last >3 days Does the patient have difficulty walking or climbing stairs?: Yes Weakness of Legs: Both Weakness of Arms/Hands: Both  Permission Sought/Granted                  Emotional Assessment         Alcohol / Substance Use: Not Applicable Psych Involvement: No (comment)  Admission diagnosis:  Dehydration [E86.0] AKI (acute kidney injury) ([place]) [N17.9] Stage 4 lung cancer ([place]) [C34.90] Patient Active Problem List   Diagnosis Date Noted  . Stage 4 lung cancer ([place]) 12/24/2019  . Dehydration 12/24/2019  . Failure to thrive (0-17)   . Metastatic lung cancer (metastasis from lung to other site) ([place]) 12/13/2019  . Failure to thrive in adult 12/13/2019  . Hypercalcemia 12/04/2019  . Tumor lysis syndrome 11/28/2019  . Acute ischemic stroke ([place]) 10/01/2019  . Primary malignant neoplasm of bronchus of left upper lobe ([place]) 06/06/2019  . Goals of care, counseling/discussion 06/06/2019  . Pancreatic abscess   . Malnutrition of moderate degree 06/14/2018  . Pancreatic mass   . Abnormal CT of the abdomen   . Abdominal pain 06/12/2018  . Leukocytosis 06/12/2018  . AKI (acute kidney injury) ([place]) 06/12/2018  . Medicare annual wellness visit, subsequent 09/09/2016  . Routine general medical examination at a health care facility 09/09/2016  . Hyperlipidemia 09/07/2015  . Neck stiffness 05/20/2015  . Hip pain, chronic 05/20/2015  . Essential hypertension 05/20/2015   PCP:  [name], [place] Pharmacy:    [id number] - [place], [address] [place] [address] Phone: [phone] Fax: [phone]  [facility] [place], [place] - [address] [place] [address] Phone: [phone] Fax: [place], [place] - [place] [address] Phone: [phone] Fax: [phone]     Social Determinants of Health (SDOH) Interventions    Readmission Risk Interventions Readmission Risk Prevention Plan 12/17/2019  Transportation Screening Complete  PCP or Specialist Appt within 3-5 Days Complete  HRI or Home Care Consult Complete  Social Work Consult for [place] Planning/Counseling Complete  Palliative Care Screening Complete  Medication Review Press photographer) Complete  Some recent data might be hidden

## 2019-12-25 NOTE — Patient Outreach (Addendum)
[facility]) Care Management  12/25/2019  Deontez Klinke 05-01-1946 [id number]   Patient noted to be hospitalized for dehydration and placement on 12/24/2019. RN CM will follow current hospitalization and follow up as appropriate.   [name], RN, MSN [facility] Care Management Coordinator Direct Line [phone] Cell [phone] Toll Free: [phone]  Fax: [phone]

## 2019-12-26 ENCOUNTER — Telehealth: Payer: Self-pay

## 2019-12-26 MED ORDER — HEPARIN SOD (PORK) LOCK FLUSH 100 UNIT/ML IV SOLN
500.0000 [IU] | INTRAVENOUS | Status: AC | PRN
  Administered 2019-12-26: 500 [IU]
  Filled 2019-12-26: qty 5

## 2019-12-26 MED ORDER — SODIUM CHLORIDE 0.9% FLUSH
10.0000 mL | INTRAVENOUS | Status: DC | PRN
  Administered 2019-12-26: 10 mL

## 2019-12-26 MED ORDER — SODIUM CHLORIDE 0.9% FLUSH
10.0000 mL | Freq: Two times a day (BID) | INTRAVENOUS | Status: DC
  Administered 2019-12-26: 10 mL

## 2019-12-26 NOTE — Progress Notes (Signed)
Peripherally Inserted Central Catheter/Midline Placement  The IV Nurse has discussed with the patient and/or persons authorized to consent for the patient, the purpose of this procedure and the potential benefits and risks involved with this procedure.  The benefits include less needle sticks, lab draws from the catheter, and the patient may be discharged home with the catheter. Risks include, but not limited to, infection, bleeding, blood clot (thrombus formation), and puncture of an artery; nerve damage and irregular heartbeat and possibility to perform a PICC exchange if needed/ordered by physician.  Alternatives to this procedure were also discussed.  Bard Power PICC patient education guide, fact sheet on infection prevention and patient information card has been provided to patient /or left at bedside.  Telephone consent obtained from son, Basim Bartnik.  PICC/Midline Placement Documentation  PICC Single Lumen 12/26/19 PICC Left Brachial 42 cm 0 cm (Active)  Indication for Insertion or Continuance of Line Home intravenous therapies (PICC only) 12/26/19 0908  Exposed Catheter (cm) 0 cm 12/26/19 0908  Site Assessment Clean;Dry;Intact 12/26/19 0908  Line Status Flushed;Saline locked;Blood return noted 12/26/19 0908  Dressing Type Transparent 12/26/19 0908  Dressing Status Clean;Dry;Intact;Antimicrobial disc in place 12/26/19 [place] checked and tightened 12/26/19 0908  Line Adjustment (NICU/IV Team Only) No 12/26/19 0908  Dressing Intervention New dressing 12/26/19 0908  Dressing Change Due 01/02/20 12/26/19 0908       Rolena Infante 12/26/2019, 9:09 AM

## 2019-12-26 NOTE — TOC Transition Note (Signed)
Transition of Care [facility]) - CM/SW Discharge Note   Patient Details  Name: Jeremy Mack MRN: [id number] Date of Birth: 10-31-1946  Transition [facility]) CM/SW Contact:  Bartholomew Crews, RN Phone Number: [phone] 12/26/2019, 1:06 PM   Clinical Narrative:    Patient to transition home today with hospice services provided by [facility]. Requested [facility]AR transportation for 3pm per [facility] request. MD notified for DC order/summary. Spoke with patient's son about transport home and verified home address. Son asked about medication to help patient's appetite. Advised son that [facility] will assist with this request. Son verbalized understanding. Bedside RN made aware of transport scheduled. Medical transport paperwork on chart. No further TOC needs identified.    Final next level of care: Home w Hospice Care Barriers to Discharge: No Barriers Identified   Patient Goals and CMS Choice Patient states their goals for this hospitalization and ongoing recovery are:: home with hospice CMS Medicare.gov Compare Post Acute Care list provided to:: Patient Represenative (must comment) Choice offered to / list presented to : Adult Children  Discharge Placement                Patient to be transferred to facility by: [place] Name of family member notified: Leonia Corona Patient and family notified of of transfer: 12/26/19  Discharge Plan and Services In-house Referral: NA Discharge Planning Services: CM Consult Post Acute Care Choice: Hospice          DME Arranged: N/A DME Agency: NA       HH Arranged: NA HH Agency: NA        Social Determinants of Health (SDOH) Interventions     Readmission Risk Interventions Readmission Risk Prevention Plan 12/17/2019  Transportation Screening Complete  PCP or Specialist Appt within 3-5 Days Complete  HRI or [place] Complete  Social Work Consult for [place] Planning/Counseling Complete  Palliative Care Screening Complete   Medication Review Press photographer) Complete  Some recent data might be hidden

## 2019-12-26 NOTE — Progress Notes (Signed)
HEMATOLOGY-ONCOLOGY PROGRESS NOTE  SUBJECTIVE: Resting quietly in bed.  No family at bedside.  Looks comfortable.  Oncology History Overview Note  Cancer Staging Small cell lung cancer ([place]) Staging form: Lung, AJCC 8th Edition - Clinical: Stage IVB (cTX, cN2, pM1c) - Signed by Truitt Merle, MD on 06/06/2019    Primary malignant neoplasm of bronchus of left upper lobe ([place])  07/11/2018 Imaging   CT AP 07/11/18 IMPRESSION: 1. Significantly improved appearance of the pancreas and peripancreatic tissues. Improved to resolved peripancreatic edema with resolved and significantly improved peripancreatic lesions. The remaining lesion was felt to represent abscess on prior endoscopic ultrasound sampling. Pseudocyst or infected pseudocyst would be the favored imaging diagnosis. 2. Persistent but improved mass effect upon the inferior aspect of the portal vein and superior aspect of the superior mesenteric vein. 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 3. Aortic Atherosclerosis (ICD10-I70.0). Bilateral common iliac artery ectasia. 4. A left adrenal nodule is indeterminate on precontrast imaging and may have decreased in size since the prior. Consider dedicated adrenal protocol CT. This could either be performed in 6 months to confirm size stability or more acutely, depending on clinical concern. 5. A splenic lesion is indeterminate and can be re-evaluated on follow-up. 6. Hepatic morphology suspicious for cirrhosis. 7. Cholelithiasis. 8.  Emphysema (ICD10-J43.9).   05/01/2019 Imaging   CT AP 05/01/19  IMPRESSION: 1. New diffuse liver metastases. 2. New mild retroperitoneal lymphadenopathy, consistent with metastatic disease. 3. Resolution of previously seen pancreatic mass since previous study. No radiographic evidence of acute pancreatitis. 4. Stable left adrenal mass, consistent with adrenal adenoma. 5. Stable markedly enlarged prostate and findings of chronic  bladder outlet obstruction. 6. Colonic diverticulosis. No radiographic evidence of diverticulitis. 7. Cholelithiasis.   Aortic Atherosclerosis (ICD10-I70.0).   05/28/2019 PET scan   PET 05/28/19  IMPRESSION: 1. Examination is positive for widespread FDG avid liver metastases. Liver lesions are too numerous to count. 2. Hypermetabolic mediastinal and upper abdominal nodal metastases. 3. Left upper lobe and right upper lobe hypermetabolic pulmonary nodules consistent with pulmonary metastasis. 4. Innumerable liver metastases noted within the axial and proximal appendicular skeleton. Bone lesions are too numerous to count. Some of these have a corresponding lytic lesion on CT images. Others appear occult on the corresponding CT images. 5. Solid hyperdense left adrenal nodule is not significantly changed from 05/01/2019 and exhibits mild FDG uptake. Indeterminate. 6. Prostate gland enlargement. Focal area of increased uptake within the left posterior gland is indeterminate.   06/04/2019 Initial Biopsy   Biopsy 06/04/19  Diagnosis Liver, needle/core biopsy - SMALL CELL CARCINOMA. SEE COMMENT.   06/06/2019 Initial Diagnosis   Small cell lung cancer ([place])   06/06/2019 Cancer Staging   Staging form: Lung, AJCC 8th Edition - Clinical: Stage IVB (cTX, cN2, pM1c) - Signed by Truitt Merle, MD on 06/06/2019   06/10/2019 - 11/04/2019 Chemotherapy   Carboplatin Day 1 and [place] day 1-3 with immunotherapy atezolizumab (Tecentriq) on day 1, every 3 weeks for 6 cycles starting 06/10/19. He is now on maintenance Tecentriq every 3 weeks. Stopped after 11/04/19 due to disease progression.    06/14/2019 Imaging    MRI brain 06/14/19  IMPRESSION: No brain metastases. Chronic small-vessel ischemic changes of the pons and cerebral hemispheric white matter.   Probable metastatic lesions within the upper cervical spine as seen. No sign of spinal canal encroachment.   07/30/2019 Imaging   CT CAP W Contrast   IMPRESSION: 1. Marked interval decrease in size of the hypermetabolic bilateral pulmonary  nodules seen on previous PET-CT. 2. Clear interval decrease in the numerous hepatic metastases. 3. Interval resolution of mediastinal and upper abdominal lymphadenopathy seen previously. 4. Stable left adrenal nodule. This shows low level FDG uptake on prior PET-CT and is indeterminate. Continued attention on follow-up recommended. 5. No new or progressive findings to suggest disease progression. 6. Stable hypoenhancing splenic lesion. Attention on follow-up recommended. 7. Prostatomegaly. 8.  Aortic Atherosclerois (ICD10-170.0)     11/07/2019 - 11/21/2019 Radiation Therapy   Radiation to brain with Dr. Lisbeth Renshaw 11/07/19-11/21/19   11/28/2019 Imaging   CT CAP WO Contrast  IMPRESSION: 1. Interval progression of liver metastases. 2. Increase in size and number of right upper lobe pulmonary nodules. New right paratracheal adenopathy 3. Stable appearance of multifocal mixed lytic and sclerotic bone metastases. 4. Aortic atherosclerosis. Coronary artery calcifications noted.   Aortic Atherosclerosis (ICD10-I70.0).   12/04/2019 - 12/04/2019 Chemotherapy   Second-line Taxol starting 12/04/19. Only received 1 dose, will proceed with hospice.      PHYSICAL EXAMINATION: ECOG PERFORMANCE STATUS: 4 - Bedbound  Vitals:   12/26/19 0800 12/26/19 1300  BP: 120/72 122/81  Pulse: 89 (!) 110  Resp: 18 16  Temp: 98 F (36.7 C) 97.9 F (36.6 C)  SpO2: 98% 100%   Filed Weights   12/24/19 1426 12/25/19 2300  Weight: 140 lb (63.5 kg) 130 lb 11.7 oz (59.3 kg)    Intake/Output from previous day: 01/27 0701 - 01/28 0700 In: 3630 [P.O.:480; I.V.:3150] Out: 1550 [Urine:1550]  GENERAL: Chronically ill-appearing male HEART: No lower extremity edema ABDOMEN:abdomen soft, non-tender and normal bowel sounds NEURO: alert & oriented to person only  LABORATORY DATA:  I have reviewed the data as listed CMP  Latest Ref Rng & Units 12/25/2019 12/24/2019 12/18/2019  Glucose 70 - 99 mg/dL 97 132(H) 102(H)  BUN 8 - 23 mg/dL 39(H) 58(H) 9  Creatinine 0.61 - 1.24 mg/dL 1.12 1.67(H) 0.84  Sodium 135 - 145 mmol/L 144 142 136  Potassium 3.5 - 5.1 mmol/L 3.9 5.0 3.8  Chloride 98 - 111 mmol/L 114(H) 107 104  CO2 22 - 32 mmol/L 18(L) 20(L) 20(L)  Calcium 8.9 - 10.3 mg/dL 10.1 11.8(H) 10.8(H)  Total Protein 6.5 - 8.1 g/dL - 7.8 7.1  Total Bilirubin 0.3 - 1.2 mg/dL - 1.1 0.7  Alkaline Phos 38 - 126 U/L - 260(H) 275(H)  AST 15 - 41 U/L - 93(H) 70(H)  ALT 0 - 44 U/L - 53(H) 46(H)    Lab Results  Component Value Date   WBC 5.1 12/25/2019   HGB 9.1 (L) 12/25/2019   HCT 27.9 (L) 12/25/2019   MCV 89.1 12/25/2019   PLT 34 (L) 12/25/2019   NEUTROABS 4.3 12/24/2019    CT Abdomen Pelvis Wo Contrast  Result Date: 11/28/2019 CLINICAL DATA:  Restaging small cell lung cancer. EXAM: CT CHEST, ABDOMEN AND PELVIS WITHOUT CONTRAST TECHNIQUE: Multidetector CT imaging of the chest, abdomen and pelvis was performed following the standard protocol without IV contrast. COMPARISON:  07/30/2019 FINDINGS: CT CHEST FINDINGS Cardiovascular: Normal heart size. No pericardial effusion. Aortic atherosclerosis lad and left circumflex coronary artery calcifications. Mediastinum/Nodes: Normal appearance of the thyroid gland. The trachea appears patent and is midline. Normal appearance of the esophagus. Right paratracheal lymph node measures 2.3 cm. Previously 0.7 cm. Lower right paratracheal lymph node measures 1.8 cm, image 24/2. Previously 0.7 cm no enlarged supraclavicular or axillary lymph nodes. Lungs/Pleura: No pleural effusion. No airspace consolidation, atelectasis or pneumothorax. Right upper lobe pulmonary nodules are again  noted in are increased from previous exam. Peripheral spiculated right upper lobe lung nodule measures 1 cm, image 60/4. Central right upper lobe perihilar nodule measures 1.1 cm, image 63/4. New from previous  exam. Adjacent nodule measures 2.0 cm, image 65/4. Also new from previous exam. Musculoskeletal: Multifocal areas of mixed lytic and sclerotic bone metastases are noted. The appearance is similar to previous exam. CT ABDOMEN PELVIS FINDINGS Hepatobiliary: Multifocal liver metastases have progressed from previous exam. Lesions are increased in size and number in the interval. For example, within lateral segment of left lobe of liver there is a 4.9 cm hypodense metastasis, image 54/2. Previously 2.1 cm. Dome of liver lesion measures 5.2 cm, image 46/2. Previously 1.8 cm segment 5 liver lesion measures 1.6 cm, image 63/2. Previously 0.8 cm. Lesion within segment 7 measures 2.2 cm, image 53/2. Previously 1.3 cm. Stones noted layering within the dependent portion of the gallbladder. No biliary ductal dilatation. Pancreas: Unremarkable. No pancreatic ductal dilatation or surrounding inflammatory changes. Spleen: Normal in size without focal abnormality. Adrenals/Urinary Tract: Normal right adrenal gland. There is a solid nodule in the left adrenal gland measuring 2 cm, image 57/2. Previously this measured the same. No kidney mass or hydronephrosis identified. Urinary bladder unremarkable. Stomach/Bowel: Stomach is within normal limits. Appendix not confidently identified. Postop change involving the sigmoid colon is again noted, stable. No evidence of bowel wall thickening, distention, or inflammatory changes. Vascular/Lymphatic: Aortic atherosclerosis. No abdominopelvic adenopathy. Reproductive: Enlargement of the prostate gland is again noted. Other: No free fluid or fluid collections. Musculoskeletal: Heterogeneous bone mineralization within the bony pelvis and lumbar spine is similar to previous exam compatible with osseous metastasis. IMPRESSION: 1. Interval progression of liver metastases. 2. Increase in size and number of right upper lobe pulmonary nodules. New right paratracheal adenopathy 3. Stable appearance of  multifocal mixed lytic and sclerotic bone metastases. 4. Aortic atherosclerosis. Coronary artery calcifications noted. Aortic Atherosclerosis (ICD10-I70.0). Electronically Signed   By: Kerby Moors M.D.   On: 11/28/2019 14:24   CT Chest Wo Contrast  Result Date: 11/28/2019 CLINICAL DATA:  Restaging small cell lung cancer. EXAM: CT CHEST, ABDOMEN AND PELVIS WITHOUT CONTRAST TECHNIQUE: Multidetector CT imaging of the chest, abdomen and pelvis was performed following the standard protocol without IV contrast. COMPARISON:  07/30/2019 FINDINGS: CT CHEST FINDINGS Cardiovascular: Normal heart size. No pericardial effusion. Aortic atherosclerosis lad and left circumflex coronary artery calcifications. Mediastinum/Nodes: Normal appearance of the thyroid gland. The trachea appears patent and is midline. Normal appearance of the esophagus. Right paratracheal lymph node measures 2.3 cm. Previously 0.7 cm. Lower right paratracheal lymph node measures 1.8 cm, image 24/2. Previously 0.7 cm no enlarged supraclavicular or axillary lymph nodes. Lungs/Pleura: No pleural effusion. No airspace consolidation, atelectasis or pneumothorax. Right upper lobe pulmonary nodules are again noted in are increased from previous exam. Peripheral spiculated right upper lobe lung nodule measures 1 cm, image 60/4. Central right upper lobe perihilar nodule measures 1.1 cm, image 63/4. New from previous exam. Adjacent nodule measures 2.0 cm, image 65/4. Also new from previous exam. Musculoskeletal: Multifocal areas of mixed lytic and sclerotic bone metastases are noted. The appearance is similar to previous exam. CT ABDOMEN PELVIS FINDINGS Hepatobiliary: Multifocal liver metastases have progressed from previous exam. Lesions are increased in size and number in the interval. For example, within lateral segment of left lobe of liver there is a 4.9 cm hypodense metastasis, image 54/2. Previously 2.1 cm. Dome of liver lesion measures 5.2 cm, image  46/2. Previously 1.8  cm segment 5 liver lesion measures 1.6 cm, image 63/2. Previously 0.8 cm. Lesion within segment 7 measures 2.2 cm, image 53/2. Previously 1.3 cm. Stones noted layering within the dependent portion of the gallbladder. No biliary ductal dilatation. Pancreas: Unremarkable. No pancreatic ductal dilatation or surrounding inflammatory changes. Spleen: Normal in size without focal abnormality. Adrenals/Urinary Tract: Normal right adrenal gland. There is a solid nodule in the left adrenal gland measuring 2 cm, image 57/2. Previously this measured the same. No kidney mass or hydronephrosis identified. Urinary bladder unremarkable. Stomach/Bowel: Stomach is within normal limits. Appendix not confidently identified. Postop change involving the sigmoid colon is again noted, stable. No evidence of bowel wall thickening, distention, or inflammatory changes. Vascular/Lymphatic: Aortic atherosclerosis. No abdominopelvic adenopathy. Reproductive: Enlargement of the prostate gland is again noted. Other: No free fluid or fluid collections. Musculoskeletal: Heterogeneous bone mineralization within the bony pelvis and lumbar spine is similar to previous exam compatible with osseous metastasis. IMPRESSION: 1. Interval progression of liver metastases. 2. Increase in size and number of right upper lobe pulmonary nodules. New right paratracheal adenopathy 3. Stable appearance of multifocal mixed lytic and sclerotic bone metastases. 4. Aortic atherosclerosis. Coronary artery calcifications noted. Aortic Atherosclerosis (ICD10-I70.0). Electronically Signed   By: Kerby Moors M.D.   On: 11/28/2019 14:24   DG Chest Portable 1 View  Result Date: 12/24/2019 CLINICAL DATA:  Hypotension, dehydration EXAM: PORTABLE CHEST 1 VIEW COMPARISON:  CT 11/28/2019 FINDINGS: Right-sided chest port remains with distal tip terminating at the level of the distal SVC. Heart size is normal. Enlargement of the right paratracheal stripe  compatible with known mediastinal adenopathy. Lungs are slightly hyperexpanded. Nodular density projects over the peripheral aspect of the right upper lobe and right perihilar regions compatible with known nodules. No new focal airspace consolidation. No pleural effusion. There is a vertical skin fold overlying the peripheral aspect of the right hemithorax with lung markings extending beyond. No evidence of pneumothorax. IMPRESSION: 1. No acute cardiopulmonary abnormality. 2. Right upper lobe and right perihilar nodular densities compatible with known lung nodules. 3. Enlargement of the right paratracheal stripe compatible with known mediastinal adenopathy. Electronically Signed   By: Davina Poke D.O.   On: 12/24/2019 16:11   [place] EKG SITE RITE  Result Date: 12/25/2019 If Site Rite image not attached, placement could not be confirmed due to current cardiac rhythm.   ASSESSMENT AND PLAN: 74 y.o.   1. Metastatic small cell lung cancer 2. Failure to thrive, malignant cachexia 3. Anemia 4. Thrombocytopenia 5. dehydration and acute renal failure, resolved 6. malignant hypercalcemia 7. History of CVA 8. Goals of care  9. Full code   -The patient appears stable.  I note the plan is to discharge him today to home.  The family has agreed to talk with hospice but would like intermittent IV fluids.  PICC line has been placed for this purpose.  Agree with this plan.  We will assist hospice with IV fluid orders.  We will follow-up once the patient is discharged.   LOS: 2 days   Mikey Bussing, DNP, AGPCNP-BC, AOCNP 12/26/19

## 2019-12-26 NOTE — Discharge Summary (Signed)
Physician Discharge Summary  Jeremy Mack JSE:[id number] DOB: Mar 15, 1946 DOA: 12/24/2019  PCP: [name], FNP  Admit date: 12/24/2019 Discharge date: 12/26/2019  Admitted From: Home Disposition: Home with hospice  Recommendations for Outpatient Follow-up:  Hospice care,may need IV  fluids  Home Health:No Equipment/Devices:No Discharge Condition:hospice CODE STATUS:Full,to be addressed by hospice Diet recommendation: Regular,soft  Brief/Interim Summary: Jeremy Foxis an 74 y.o.malewith PMH significant for widely metastatic lung cancer with no further treatment options who was recently discharged 5 prior to admission for treatment of dehydration and failure to thrive who now presents with lethargy, weakness, inability to take p.o.'s and ongoing failure to thrive. Previously been referred to hospice however during his last hospital stay, family rescinded his DNR and felt like he deserved more aggressive treatment. They preferred placement in a skilled nursing facility rather than a hospice facility but due to no beds being available they did take him home. However at home patient has been unable to take adequate intake for his needs. He has had increasing weakness and dehydration and palliative care NP today noted low blood pressure and tachycardia. Family requested transfer the hospital for further care. History as per ED provider as well as chart review. Patient was admitted and given 4L IVF with resolution in tachycardia and AKI.  Discharge Diagnoses:  Principal Problem:   Stage 4 lung cancer ([place]) Active Problems:   Essential hypertension   AKI (acute kidney injury) ([place])   Failure to thrive in adult   Dehydration   Dehydration: (resolved) AKI on admission now resolved. Patient received 4 L IVF and now on mIVF. Echocardiogram done November 2020 shows EF of 60 to 65% with grade 1 diastolic dysfunction. Per the patient's oncologist [name] who will discuss possible  PICC placement with family as dehydration will likely continue to happen.  PICC line was placed. SW [name] spoke with son: "And he agreed to talk to the liaison for [facility]. [facility] is agreeable to PICC line for prn fluids if needed. [facility] has already spoken with son to discuss what they can do, and son has agreed. So still needs PICC and they will get DME in the home"  Prolonged QT Although patient is prescribed multiple meds which prolong QT, patient is apparently not taking any of them per med rec/family report. Mag and K now WNL  Stage IV Lung Ca No further treatment optionsavailableper [name]. [name] aware that patient is hospitalized and has communicated with provider today; appreciate her help as she knows situation and family well   HTN Holding all antihypertensives given relatively low blood pressure; antihypertensives  Discontinued on  discharge   Cerebrovascular disease  Status post right thalamic stroke November 2020 Continue aspirin Xarelto being held, patient was not getting it at home per med rec.  Discharge Instructions  Discharge Instructions    Diet - low sodium heart healthy   Complete by: As directed    Increase activity slowly   Complete by: As directed      Allergies as of 12/26/2019   No Known Allergies     Medication List    TAKE these medications   acetaminophen 325 MG tablet Commonly known as: TYLENOL Take 2 tablets (650 mg total) by mouth every 6 (six) hours as needed for mild pain (or Fever >/= 101).   allopurinol 300 MG tablet Commonly known as: ZYLOPRIM Take 1 tablet (300 mg total) by mouth 2 (two) times daily.   amLODipine 10 MG tablet Commonly known as:  NORVASC Take 1 tablet (10 mg total) by mouth daily.   apixaban 5 MG Tabs tablet Commonly known as: ELIQUIS Take 1 tablet (5 mg total) by mouth 2 (two) times daily.   aspirin 81 MG chewable tablet Chew 81 mg by mouth daily.   haloperidol 0.5 MG tablet Commonly  known as: HALDOL Take 1 tablet (0.5 mg total) by mouth every 4 (four) hours as needed for agitation (or delirium).   lactose free nutrition Liqd Take 237 mLs by mouth 3 (three) times daily with meals.   lidocaine-prilocaine cream Commonly known as: EMLA Apply 1 application topically as needed.   megestrol 625 MG/5ML suspension Commonly known as: MEGACE ES Take 5 mLs (625 mg total) by mouth daily.   multivitamin with minerals Tabs tablet Take 1 tablet by mouth daily.   ondansetron 8 MG tablet Commonly known as: Zofran Take 1 tablet (8 mg total) by mouth 2 (two) times daily as needed for refractory nausea / vomiting. Start on day 3 after carboplatin chemo.   pantoprazole 20 MG tablet Commonly known as: PROTONIX Take 1 tablet (20 mg total) by mouth daily.   prochlorperazine 10 MG tablet Commonly known as: COMPAZINE Take 1 tablet (10 mg total) by mouth every 6 (six) hours as needed (Nausea or vomiting).   senna-docusate 8.6-50 MG tablet Commonly known as: Senokot-S Take 1 tablet by mouth at bedtime as needed for mild constipation.   traMADol 50 MG tablet Commonly known as: ULTRAM Take 1 tablet (50 mg total) by mouth every 6 (six) hours as needed for moderate pain.      Follow-up Information    [facility] Follow up.   Contact information: [phone]  [address], [place], [place] [address]          No Known Allergies      Procedures/Studies: CT Abdomen Pelvis Wo Contrast  Result Date: 11/28/2019 CLINICAL DATA:  Restaging small cell lung cancer. EXAM: CT CHEST, ABDOMEN AND PELVIS WITHOUT CONTRAST TECHNIQUE: Multidetector CT imaging of the chest, abdomen and pelvis was performed following the standard protocol without IV contrast. COMPARISON:  07/30/2019 FINDINGS: CT CHEST FINDINGS Cardiovascular: Normal heart size. No pericardial effusion. Aortic atherosclerosis lad and left circumflex coronary artery calcifications. Mediastinum/Nodes: Normal appearance of  the thyroid gland. The trachea appears patent and is midline. Normal appearance of the esophagus. Right paratracheal lymph node measures 2.3 cm. Previously 0.7 cm. Lower right paratracheal lymph node measures 1.8 cm, image 24/2. Previously 0.7 cm no enlarged supraclavicular or axillary lymph nodes. Lungs/Pleura: No pleural effusion. No airspace consolidation, atelectasis or pneumothorax. Right upper lobe pulmonary nodules are again noted in are increased from previous exam. Peripheral spiculated right upper lobe lung nodule measures 1 cm, image 60/4. Central right upper lobe perihilar nodule measures 1.1 cm, image 63/4. New from previous exam. Adjacent nodule measures 2.0 cm, image 65/4. Also new from previous exam. Musculoskeletal: Multifocal areas of mixed lytic and sclerotic bone metastases are noted. The appearance is similar to previous exam. CT ABDOMEN PELVIS FINDINGS Hepatobiliary: Multifocal liver metastases have progressed from previous exam. Lesions are increased in size and number in the interval. For example, within lateral segment of left lobe of liver there is a 4.9 cm hypodense metastasis, image 54/2. Previously 2.1 cm. Dome of liver lesion measures 5.2 cm, image 46/2. Previously 1.8 cm segment 5 liver lesion measures 1.6 cm, image 63/2. Previously 0.8 cm. Lesion within segment 7 measures 2.2 cm, image 53/2. Previously 1.3 cm. Stones noted layering within the  dependent portion of the gallbladder. No biliary ductal dilatation. Pancreas: Unremarkable. No pancreatic ductal dilatation or surrounding inflammatory changes. Spleen: Normal in size without focal abnormality. Adrenals/Urinary Tract: Normal right adrenal gland. There is a solid nodule in the left adrenal gland measuring 2 cm, image 57/2. Previously this measured the same. No kidney mass or hydronephrosis identified. Urinary bladder unremarkable. Stomach/Bowel: Stomach is within normal limits. Appendix not confidently identified. Postop change  involving the sigmoid colon is again noted, stable. No evidence of bowel wall thickening, distention, or inflammatory changes. Vascular/Lymphatic: Aortic atherosclerosis. No abdominopelvic adenopathy. Reproductive: Enlargement of the prostate gland is again noted. Other: No free fluid or fluid collections. Musculoskeletal: Heterogeneous bone mineralization within the bony pelvis and lumbar spine is similar to previous exam compatible with osseous metastasis. IMPRESSION: 1. Interval progression of liver metastases. 2. Increase in size and number of right upper lobe pulmonary nodules. New right paratracheal adenopathy 3. Stable appearance of multifocal mixed lytic and sclerotic bone metastases. 4. Aortic atherosclerosis. Coronary artery calcifications noted. Aortic Atherosclerosis (ICD10-I70.0). Electronically Signed   By: Kerby Moors M.D.   On: 11/28/2019 14:24   CT Chest Wo Contrast  Result Date: 11/28/2019 CLINICAL DATA:  Restaging small cell lung cancer. EXAM: CT CHEST, ABDOMEN AND PELVIS WITHOUT CONTRAST TECHNIQUE: Multidetector CT imaging of the chest, abdomen and pelvis was performed following the standard protocol without IV contrast. COMPARISON:  07/30/2019 FINDINGS: CT CHEST FINDINGS Cardiovascular: Normal heart size. No pericardial effusion. Aortic atherosclerosis lad and left circumflex coronary artery calcifications. Mediastinum/Nodes: Normal appearance of the thyroid gland. The trachea appears patent and is midline. Normal appearance of the esophagus. Right paratracheal lymph node measures 2.3 cm. Previously 0.7 cm. Lower right paratracheal lymph node measures 1.8 cm, image 24/2. Previously 0.7 cm no enlarged supraclavicular or axillary lymph nodes. Lungs/Pleura: No pleural effusion. No airspace consolidation, atelectasis or pneumothorax. Right upper lobe pulmonary nodules are again noted in are increased from previous exam. Peripheral spiculated right upper lobe lung nodule measures 1 cm, image  60/4. Central right upper lobe perihilar nodule measures 1.1 cm, image 63/4. New from previous exam. Adjacent nodule measures 2.0 cm, image 65/4. Also new from previous exam. Musculoskeletal: Multifocal areas of mixed lytic and sclerotic bone metastases are noted. The appearance is similar to previous exam. CT ABDOMEN PELVIS FINDINGS Hepatobiliary: Multifocal liver metastases have progressed from previous exam. Lesions are increased in size and number in the interval. For example, within lateral segment of left lobe of liver there is a 4.9 cm hypodense metastasis, image 54/2. Previously 2.1 cm. Dome of liver lesion measures 5.2 cm, image 46/2. Previously 1.8 cm segment 5 liver lesion measures 1.6 cm, image 63/2. Previously 0.8 cm. Lesion within segment 7 measures 2.2 cm, image 53/2. Previously 1.3 cm. Stones noted layering within the dependent portion of the gallbladder. No biliary ductal dilatation. Pancreas: Unremarkable. No pancreatic ductal dilatation or surrounding inflammatory changes. Spleen: Normal in size without focal abnormality. Adrenals/Urinary Tract: Normal right adrenal gland. There is a solid nodule in the left adrenal gland measuring 2 cm, image 57/2. Previously this measured the same. No kidney mass or hydronephrosis identified. Urinary bladder unremarkable. Stomach/Bowel: Stomach is within normal limits. Appendix not confidently identified. Postop change involving the sigmoid colon is again noted, stable. No evidence of bowel wall thickening, distention, or inflammatory changes. Vascular/Lymphatic: Aortic atherosclerosis. No abdominopelvic adenopathy. Reproductive: Enlargement of the prostate gland is again noted. Other: No free fluid or fluid collections. Musculoskeletal: Heterogeneous bone mineralization within the bony pelvis and  lumbar spine is similar to previous exam compatible with osseous metastasis. IMPRESSION: 1. Interval progression of liver metastases. 2. Increase in size and number of  right upper lobe pulmonary nodules. New right paratracheal adenopathy 3. Stable appearance of multifocal mixed lytic and sclerotic bone metastases. 4. Aortic atherosclerosis. Coronary artery calcifications noted. Aortic Atherosclerosis (ICD10-I70.0). Electronically Signed   By: Kerby Moors M.D.   On: 11/28/2019 14:24   DG Chest Portable 1 View  Result Date: 12/24/2019 CLINICAL DATA:  Hypotension, dehydration EXAM: PORTABLE CHEST 1 VIEW COMPARISON:  CT 11/28/2019 FINDINGS: Right-sided chest port remains with distal tip terminating at the level of the distal SVC. Heart size is normal. Enlargement of the right paratracheal stripe compatible with known mediastinal adenopathy. Lungs are slightly hyperexpanded. Nodular density projects over the peripheral aspect of the right upper lobe and right perihilar regions compatible with known nodules. No new focal airspace consolidation. No pleural effusion. There is a vertical skin fold overlying the peripheral aspect of the right hemithorax with lung markings extending beyond. No evidence of pneumothorax. IMPRESSION: 1. No acute cardiopulmonary abnormality. 2. Right upper lobe and right perihilar nodular densities compatible with known lung nodules. 3. Enlargement of the right paratracheal stripe compatible with known mediastinal adenopathy. Electronically Signed   By: Davina Poke D.O.   On: 12/24/2019 16:11   [place] EKG SITE RITE  Result Date: 12/25/2019 If Site Rite image not attached, placement could not be confirmed due to current cardiac rhythm.      Subjective: Patient has no complaints, appears calm, in NAD.Ready to go home.  Discharge Exam: Vitals:   12/26/19 0800 12/26/19 1300  BP: 120/72 122/81  Pulse: 89 (!) 110  Resp: 18 16  Temp: 98 F (36.7 C) 97.9 F (36.6 C)  SpO2: 98% 100%   Vitals:   12/25/19 2300 12/26/19 0520 12/26/19 0800 12/26/19 1300  BP:  114/74 120/72 122/81  Pulse:  (!) 108 89 (!) 110  Resp:  16 18 16   Temp:  98.1  F (36.7 C) 98 F (36.7 C) 97.9 F (36.6 C)  TempSrc:   Oral Axillary  SpO2:  100% 98% 100%  Weight: 59.3 kg     Height:        General: Pt is alert, awake, not in acute distress Cardiovascular: RRR, S1/S2 present Respiratory: No distress,no wheezing, no rhonchi Abdominal: Soft, NT, ND Extremities: no edema, no cyanosis    The results of significant diagnostics from this hospitalization (including imaging, microbiology, ancillary and laboratory) are listed below for reference.     Microbiology: Recent Results (from the past 240 hour(s))  SARS CORONAVIRUS 2 (TAT 6-24 HRS) Nasopharyngeal Nasopharyngeal Swab     Status: None   Collection Time: 12/24/19  6:59 PM   Specimen: Nasopharyngeal Swab  Result Value Ref Range Status   SARS Coronavirus 2 NEGATIVE NEGATIVE Final    Comment: (NOTE) SARS-CoV-2 target nucleic acids are NOT DETECTED. The SARS-CoV-2 RNA is generally detectable in upper and lower respiratory specimens during the acute phase of infection. Negative results do not preclude SARS-CoV-2 infection, do not rule out co-infections with other pathogens, and should not be used as the sole basis for treatment or other patient management decisions. Negative results must be combined with clinical observations, patient history, and epidemiological information. The expected result is Negative. Fact Sheet for Patients: SugarRoll.be Fact Sheet for Healthcare Providers: https://www.woods-mathews.com/ This test is not yet approved or cleared by the [place] FDA and  has been authorized for detection and/or  diagnosis of SARS-CoV-2 by FDA under an Emergency Use Authorization (EUA). This EUA will remain  in effect (meaning this test can be used) for the duration of the COVID-19 declaration under Section 56 4(b)(1) of the Act, 21 U.S.C. section 360bbb-3(b)(1), unless the authorization is terminated or revoked sooner. Performed at [facility], [place] [address] [place], [place] [address]      Labs: BNP (last 3 results) Recent Labs    12/24/19 1630  BNP 62.9   Basic Metabolic Panel: Recent Labs  Lab 12/24/19 1429 12/24/19 2016 12/25/19 0500  NA 142  --  144  K 5.0  --  3.9  CL 107  --  114*  CO2 20*  --  18*  GLUCOSE 132*  --  97  BUN 58*  --  39*  CREATININE 1.67*  --  1.12  CALCIUM 11.8*  --  10.1  MG  --  2.3 2.1   Liver Function Tests: Recent Labs  Lab 12/24/19 1429  AST 93*  ALT 53*  ALKPHOS 260*  BILITOT 1.1  PROT 7.8  ALBUMIN 3.1*   No results for input(s): LIPASE, AMYLASE in the last 168 hours. No results for input(s): AMMONIA in the last 168 hours. CBC: Recent Labs  Lab 12/24/19 1429 12/25/19 0500  WBC 8.0 5.1  NEUTROABS 4.3  --   HGB 11.8* 9.1*  HCT 36.2* 27.9*  MCV 88.9 89.1  PLT 54* 34*   Cardiac Enzymes: No results for input(s): CKTOTAL, CKMB, CKMBINDEX, TROPONINI in the last 168 hours. BNP: Invalid input(s): POCBNP CBG: No results for input(s): GLUCAP in the last 168 hours. D-Dimer No results for input(s): DDIMER in the last 72 hours. Hgb A1c No results for input(s): HGBA1C in the last 72 hours. Lipid Profile No results for input(s): CHOL, HDL, LDLCALC, TRIG, CHOLHDL, LDLDIRECT in the last 72 hours. Thyroid function studies No results for input(s): TSH, T4TOTAL, T3FREE, THYROIDAB in the last 72 hours.  Invalid input(s): FREET3 Anemia work up No results for input(s): VITAMINB12, FOLATE, FERRITIN, TIBC, IRON, RETICCTPCT in the last 72 hours. Urinalysis    Component Value Date/Time   COLORURINE YELLOW 12/24/2019 1750   APPEARANCEUR CLEAR 12/24/2019 1750   LABSPEC 1.023 12/24/2019 1750   PHURINE 5.0 12/24/2019 1750   GLUCOSEU NEGATIVE 12/24/2019 1750   HGBUR NEGATIVE 12/24/2019 1750   BILIRUBINUR NEGATIVE 12/24/2019 1750   KETONESUR NEGATIVE 12/24/2019 1750   PROTEINUR 30 (A) 12/24/2019 1750   NITRITE NEGATIVE 12/24/2019 1750   LEUKOCYTESUR SMALL (A)  12/24/2019 1750   Sepsis Labs Invalid input(s): PROCALCITONIN,  WBC,  LACTICIDVEN Microbiology Recent Results (from the past 240 hour(s))  SARS CORONAVIRUS 2 (TAT 6-24 HRS) Nasopharyngeal Nasopharyngeal Swab     Status: None   Collection Time: 12/24/19  6:59 PM   Specimen: Nasopharyngeal Swab  Result Value Ref Range Status   SARS Coronavirus 2 NEGATIVE NEGATIVE Final    Comment: (NOTE) SARS-CoV-2 target nucleic acids are NOT DETECTED. The SARS-CoV-2 RNA is generally detectable in upper and lower respiratory specimens during the acute phase of infection. Negative results do not preclude SARS-CoV-2 infection, do not rule out co-infections with other pathogens, and should not be used as the sole basis for treatment or other patient management decisions. Negative results must be combined with clinical observations, patient history, and epidemiological information. The expected result is Negative. Fact Sheet for Patients: SugarRoll.be Fact Sheet for Healthcare Providers: https://www.woods-mathews.com/ This test is not yet approved or cleared by the [place] FDA  and  has been authorized for detection and/or diagnosis of SARS-CoV-2 by FDA under an Emergency Use Authorization (EUA). This EUA will remain  in effect (meaning this test can be used) for the duration of the COVID-19 declaration under Section 56 4(b)(1) of the Act, 21 U.S.C. section 360bbb-3(b)(1), unless the authorization is terminated or revoked sooner. Performed at [facility], [place] [address] [place], [place] [address]      Time coordinating discharge: Over 33 minutes  SIGNED:   Blain Pais, MD  [facility] 12/26/2019, 2:02 PM

## 2019-12-26 NOTE — Telephone Encounter (Signed)
Faxed signed orders to [facility] to fax [phone], received confirmation fax went through, sent to HIM for scan to chart.

## 2019-12-26 NOTE — Telephone Encounter (Signed)
Confirmed with Case manager at hospital that plan is for patient to be admitted to [facility] hospice. Will notify Palliative care team and close referral

## 2019-12-26 NOTE — Plan of Care (Signed)
  Problem: Education: Goal: Knowledge of General Education information will improve Description Including pain rating scale, medication(s)/side effects and non-pharmacologic comfort measures Outcome: Progressing   

## 2019-12-27 ENCOUNTER — Telehealth: Payer: Self-pay | Admitting: Pharmacist

## 2019-12-27 ENCOUNTER — Other Ambulatory Visit: Payer: Self-pay

## 2019-12-27 ENCOUNTER — Telehealth: Payer: Self-pay

## 2019-12-27 NOTE — Telephone Encounter (Signed)
Faxed a request to [facility] as this patient was discharge home yesterday to their service.  I have asked that they connect with this family right away, also he went home with a PICC line and should received IVF as needed, [name] also ordered Megace for appetite stimulation and we want to be sure he has this.  I have asked for a status update asap.

## 2019-12-27 NOTE — Patient Outreach (Signed)
[facility]) Care Management  12/27/2019  Jeremy Mack August 19, 1946 [id number]   Incoming call from [place], Denyse Amass, regarding patient need for food assistance post hospital discharge.  Spoke to patient's spouse about [employer] program and she consented to referral.  Patient will receive first delivery of 14 prepared meals on 01/03/2020 and last delivery on 01/15/20.  Provided spouse with my contact information and encouraged her to call me if any issues arise with meal delivery.  Ronn Melena, BSW Social Worker [phone]

## 2019-12-27 NOTE — Patient Outreach (Signed)
[facility]) Care Management  12/27/2019  Jeremy Mack 02/10/1946 [id number]   Patient discharged home on 12/26/19 with hospice.  CM will close case.   [name], RN, MSN [facility] Care Management Coordinator Direct Line [phone] Cell [phone] Toll Free: [phone]  Fax: [phone]

## 2019-12-27 NOTE — Patient Outreach (Signed)
[facility]) Care Management  12/27/2019  Jeremy Mack [birth year] [id number]  Patient was called and spoke with his wife, [name]. HIPAA identifiers were obtained. Patient's wife said she was "doing the best she could".  Patient was discharged from the hospital with hospice.  She expressed a need for food.   [facility] Social Worker, Geographical information systems officer was consulted. [name] said she would reach out to the patient's wife today and provide some food resources.  Plan: Close [name]. Will gladly reopen upon request.  [name], PharmD, [place] Clinical Pharmacist [phone]

## 2020-01-03 ENCOUNTER — Ambulatory Visit: Payer: Self-pay | Admitting: Pharmacist

## 2020-01-08 ENCOUNTER — Ambulatory Visit: Payer: Medicare PPO | Admitting: Hematology

## 2020-01-08 ENCOUNTER — Ambulatory Visit: Payer: Medicare PPO

## 2020-01-08 ENCOUNTER — Other Ambulatory Visit: Payer: Medicare PPO

## 2020-01-27 DEATH — deceased

## 2020-01-30 NOTE — Progress Notes (Signed)
  Radiation Oncology         [phone] ________________________________  Name: Jeremy Mack MRN: [id number]  Date: 11/21/2019  DOB: May 03, 1946  End of Treatment Note  Diagnosis:   Small cell lung cancer  Indication for treatment::  palliative       Radiation treatment dates:   11/17/19 - 11/21/19  Site/dose:   The patient was treated with a course of whole brain radiation therapy to a dose of 25 Gy in 10 fractions.  This was accomplished using a 2 field technique with additional reduced fields as necessary to improve dose homogeneity.  Narrative: The patient tolerated radiation treatment relatively well.   No unexpected difficulties in terms of acute toxicity.  The skin held up to treatment fairly well.  Plan: The patient has completed radiation treatment. The patient will return to radiation oncology clinic for routine followup in one month. I advised the patient to call or return sooner if they have any questions or concerns related to their recovery or treatment. ________________________________  Jodelle Gross, M.D., Ph.D.

## 2023-10-19 ENCOUNTER — Encounter: Payer: Self-pay | Admitting: Nurse Practitioner

## 2023-10-19 NOTE — Telephone Encounter (Signed)
Telephone call
